# Patient Record
Sex: Female | Born: 1945 | Race: Black or African American | Hispanic: No | Marital: Married | Smoking: Never smoker
Health system: Southern US, Community
[De-identification: ages and names within clinical notes are randomized; demographics above are authoritative.]

## PROBLEM LIST (undated history)

## (undated) DIAGNOSIS — F0390 Unspecified dementia without behavioral disturbance: Secondary | ICD-10-CM

## (undated) DIAGNOSIS — E785 Hyperlipidemia, unspecified: Secondary | ICD-10-CM

## (undated) HISTORY — DX: Unspecified dementia, unspecified severity, without behavioral disturbance, psychotic disturbance, mood disturbance, and anxiety: F03.90

## (undated) HISTORY — DX: Hyperlipidemia, unspecified: E78.5

---

## 2004-08-28 ENCOUNTER — Emergency Department (HOSPITAL_COMMUNITY): Admission: EM | Admit: 2004-08-28 | Discharge: 2004-08-28 | Payer: Self-pay | Admitting: Family Medicine

## 2006-12-20 ENCOUNTER — Ambulatory Visit: Payer: Self-pay | Admitting: Family Medicine

## 2007-09-20 ENCOUNTER — Ambulatory Visit: Payer: Self-pay | Admitting: Family Medicine

## 2007-10-25 ENCOUNTER — Other Ambulatory Visit: Admission: RE | Admit: 2007-10-25 | Discharge: 2007-10-25 | Payer: Self-pay | Admitting: Gynecology

## 2008-03-22 ENCOUNTER — Emergency Department (HOSPITAL_COMMUNITY): Admission: EM | Admit: 2008-03-22 | Discharge: 2008-03-22 | Payer: Self-pay | Admitting: Emergency Medicine

## 2008-05-01 ENCOUNTER — Ambulatory Visit: Payer: Self-pay | Admitting: Family Medicine

## 2008-05-01 ENCOUNTER — Encounter: Admission: RE | Admit: 2008-05-01 | Discharge: 2008-05-01 | Payer: Self-pay | Admitting: Family Medicine

## 2008-05-07 ENCOUNTER — Encounter: Admission: RE | Admit: 2008-05-07 | Discharge: 2008-06-21 | Payer: Self-pay | Admitting: Family Medicine

## 2008-06-12 ENCOUNTER — Ambulatory Visit: Payer: Self-pay | Admitting: Family Medicine

## 2008-07-11 ENCOUNTER — Ambulatory Visit: Payer: Self-pay | Admitting: Family Medicine

## 2009-03-21 ENCOUNTER — Ambulatory Visit: Payer: Self-pay | Admitting: Family Medicine

## 2010-05-11 ENCOUNTER — Encounter: Payer: Self-pay | Admitting: Family Medicine

## 2010-05-13 ENCOUNTER — Ambulatory Visit: Admit: 2010-05-13 | Payer: Self-pay | Admitting: Family Medicine

## 2011-04-30 ENCOUNTER — Encounter: Payer: Self-pay | Admitting: Internal Medicine

## 2012-05-12 ENCOUNTER — Ambulatory Visit (INDEPENDENT_AMBULATORY_CARE_PROVIDER_SITE_OTHER): Payer: Medicare Other | Admitting: Family Medicine

## 2012-05-12 ENCOUNTER — Encounter: Payer: Self-pay | Admitting: Medical

## 2012-05-12 VITALS — BP 150/78 | HR 72 | Temp 97.8°F | Resp 16 | Wt 145.0 lb

## 2012-05-12 DIAGNOSIS — R030 Elevated blood-pressure reading, without diagnosis of hypertension: Secondary | ICD-10-CM

## 2012-05-12 NOTE — Progress Notes (Signed)
  Subjective:    Patient ID: Amanda [name], female    DOB: July 31, 1945, 67 y.o.   MRN: [id number]  HPI She is here for consult concerning elevated blood pressure. She was seen recently and did have elevated blood pressure. She is here for further evaluation. Review of her record indicates she has had several systolics in the 150 range. Today's reading is still in the range.  Review of Systems     Objective:   Physical Exam Alert and in no distress otherwise not examined       Assessment & Plan:   1. Elevated blood pressure    I discussed treatment of blood pressure with her. Discussed risk of untreated pressure in regard to stroke, heart failure and kidney failure. Discussed diet, exercise, smoking and drinking. She does not smoke or drink. She has started a recent exercise program. After discussion with her, we will wait one month and check her pressure again after she has been involved in a program for least one month. She is comfortable with that approach.

## 2012-06-12 ENCOUNTER — Encounter: Payer: Self-pay | Admitting: Family Medicine

## 2012-06-12 ENCOUNTER — Ambulatory Visit (INDEPENDENT_AMBULATORY_CARE_PROVIDER_SITE_OTHER): Payer: Medicare Other | Admitting: Family Medicine

## 2012-06-12 VITALS — BP 150/80 | HR 96 | Wt 145.0 lb

## 2012-06-12 DIAGNOSIS — I10 Essential (primary) hypertension: Secondary | ICD-10-CM | POA: Insufficient documentation

## 2012-06-12 MED ORDER — HYDROCHLOROTHIAZIDE 12.5 MG PO CAPS: 12.5000 mg | ORAL_CAPSULE | Freq: Every day | ORAL | Status: DC

## 2012-06-12 NOTE — Progress Notes (Signed)
  Subjective:    Patient ID: Amanda [name], female    DOB: December 10, 1945, 67 y.o.   MRN: [id number]  HPI She is here for recheck. She has started an exercise program walking fairly regularly with her husband.   Review of Systems     Objective:   Physical Exam Alert and in no distress. Blood pressure is recorded.       Assessment & Plan:  Hypertension - Plan: hydrochlorothiazide (MICROZIDE) 12.5 MG capsule since her systolic is still elevated, I decided to place her on HCTZ. Discussed that she will need be on this regularly. Recheck here one month.

## 2012-07-10 ENCOUNTER — Ambulatory Visit (INDEPENDENT_AMBULATORY_CARE_PROVIDER_SITE_OTHER): Payer: Medicare Other | Admitting: Family Medicine

## 2012-07-10 ENCOUNTER — Encounter: Payer: Self-pay | Admitting: Family Medicine

## 2012-07-10 VITALS — BP 126/80 | HR 78 | Wt 143.0 lb

## 2012-07-10 DIAGNOSIS — I10 Essential (primary) hypertension: Secondary | ICD-10-CM

## 2012-07-10 NOTE — Progress Notes (Signed)
  Subjective:    Patient ID: Amanda [name], female    DOB: 07-30-1945, 67 y.o.   MRN: [id number]  HPI She is here for recheck on her blood pressure. She and her husband continue to walk on a regular basis. She is having no difficulty with the HCTZ.   Review of Systems     Objective:   Physical Exam Alert and in no distress. Blood pressure is recorded       Assessment & Plan:  Hypertension encouraged her to continue taking the medication and with her active lifestyle.

## 2013-02-15 ENCOUNTER — Other Ambulatory Visit (INDEPENDENT_AMBULATORY_CARE_PROVIDER_SITE_OTHER): Payer: Medicare Other

## 2013-02-15 DIAGNOSIS — Z23 Encounter for immunization: Secondary | ICD-10-CM

## 2013-03-09 ENCOUNTER — Encounter: Payer: Self-pay | Admitting: *Deleted

## 2013-04-07 ENCOUNTER — Telehealth: Payer: Self-pay | Admitting: Family Medicine

## 2013-05-18 NOTE — Telephone Encounter (Signed)
Message left

## 2013-06-15 ENCOUNTER — Other Ambulatory Visit: Payer: Self-pay | Admitting: Family Medicine

## 2013-07-13 ENCOUNTER — Encounter: Payer: Self-pay | Admitting: Family Medicine

## 2013-07-13 ENCOUNTER — Ambulatory Visit (INDEPENDENT_AMBULATORY_CARE_PROVIDER_SITE_OTHER): Payer: Medicare Other | Admitting: Family Medicine

## 2013-07-13 VITALS — BP 110/72 | HR 72 | Wt 143.0 lb

## 2013-07-13 DIAGNOSIS — Z1211 Encounter for screening for malignant neoplasm of colon: Secondary | ICD-10-CM

## 2013-07-13 DIAGNOSIS — I10 Essential (primary) hypertension: Secondary | ICD-10-CM

## 2013-07-13 DIAGNOSIS — Z79899 Other long term (current) drug therapy: Secondary | ICD-10-CM

## 2013-07-13 DIAGNOSIS — E785 Hyperlipidemia, unspecified: Secondary | ICD-10-CM

## 2013-07-13 LAB — COMPREHENSIVE METABOLIC PANEL
ALK PHOS: 49 U/L (ref 39–117)
ALT: 14 U/L (ref 0–35)
AST: 18 U/L (ref 0–37)
Albumin: 4.3 g/dL (ref 3.5–5.2)
BILIRUBIN TOTAL: 0.3 mg/dL (ref 0.2–1.2)
BUN: 12 mg/dL (ref 6–23)
CO2: 26 meq/L (ref 19–32)
CREATININE: 0.78 mg/dL (ref 0.50–1.10)
Calcium: 9.5 mg/dL (ref 8.4–10.5)
Chloride: 106 mEq/L (ref 96–112)
GLUCOSE: 86 mg/dL (ref 70–99)
Potassium: 4.1 mEq/L (ref 3.5–5.3)
SODIUM: 143 meq/L (ref 135–145)
TOTAL PROTEIN: 7.1 g/dL (ref 6.0–8.3)

## 2013-07-13 LAB — CBC WITH DIFFERENTIAL/PLATELET
BASOS PCT: 1 % (ref 0–1)
Basophils Absolute: 0.1 10*3/uL (ref 0.0–0.1)
EOS ABS: 0.1 10*3/uL (ref 0.0–0.7)
EOS PCT: 1 % (ref 0–5)
HCT: 36.1 % (ref 36.0–46.0)
HEMOGLOBIN: 12.7 g/dL (ref 12.0–15.0)
LYMPHS ABS: 2.4 10*3/uL (ref 0.7–4.0)
Lymphocytes Relative: 37 % (ref 12–46)
MCH: 28.3 pg (ref 26.0–34.0)
MCHC: 35.2 g/dL (ref 30.0–36.0)
MCV: 80.6 fL (ref 78.0–100.0)
MONOS PCT: 7 % (ref 3–12)
Monocytes Absolute: 0.5 10*3/uL (ref 0.1–1.0)
NEUTROS PCT: 54 % (ref 43–77)
Neutro Abs: 3.5 10*3/uL (ref 1.7–7.7)
PLATELETS: 293 10*3/uL (ref 150–400)
RBC: 4.48 MIL/uL (ref 3.87–5.11)
RDW: 14 % (ref 11.5–15.5)
WBC: 6.5 10*3/uL (ref 4.0–10.5)

## 2013-07-13 LAB — LIPID PANEL
CHOL/HDL RATIO: 3.6 ratio
CHOLESTEROL: 197 mg/dL (ref 0–200)
HDL: 55 mg/dL (ref >39)
LDL CALC: 128 mg/dL — AB (ref 0–99)
TRIGLYCERIDES: 71 mg/dL (ref <150)
VLDL: 14 mg/dL (ref 0–40)

## 2013-07-13 MED ORDER — HYDROCHLOROTHIAZIDE 12.5 MG PO CAPS: ORAL_CAPSULE | ORAL | Status: DC

## 2013-07-13 NOTE — Progress Notes (Signed)
   Subjective:    Patient ID: Amanda [name], female    DOB: [birth year], 68 y.o.   MRN: [id number]  HPI She is here for medication check. She does have hypertension and is on HCTZ. She has no concerns about this. There is a history of hyperlipidemia. She has not had blood work done in quite some time. Review his record indicates she does need a followup mammogram as well as colonoscopy. Her immunizations are up to date. Social and family history were reviewed. She has been married for over 50 years. She has no other concerns or complaints.   Review of Systems Negative Except as above    Objective:   Physical Exam alert and in no distress. Tympanic membranes and canals are normal. Throat is clear. Tonsils are normal. Neck is supple without adenopathy or thyromegaly. Cardiac exam shows a regular sinus rhythm without murmurs or gallops. Lungs are clear to auscultation. DTRs are normal.        Assessment & Plan:  Hypertension - Plan: CBC with Differential, Comprehensive metabolic panel, hydrochlorothiazide (MICROZIDE) 12.5 MG capsule  Hyperlipidemia LDL goal < 130 - Plan: Lipid panel  Special screening for malignant neoplasms, colon - Plan: Ambulatory referral to Gastroenterology  Encounter for long-term (current) use of other medications - Plan: MM DIGITAL SCREENING BILATERAL

## 2013-08-27 LAB — HM MAMMOGRAPHY

## 2013-09-18 ENCOUNTER — Encounter: Payer: Self-pay | Admitting: Family Medicine

## 2013-09-27 ENCOUNTER — Encounter: Payer: Self-pay | Admitting: Internal Medicine

## 2013-12-06 ENCOUNTER — Encounter: Payer: Self-pay | Admitting: Family Medicine

## 2014-05-23 ENCOUNTER — Telehealth: Payer: Self-pay | Admitting: Family Medicine

## 2014-05-23 NOTE — Telephone Encounter (Signed)
Pt's sister.Amanda HashimotoPatricia, requesting a referral to [facility] because pt is showing early signs of Alzheimers and their mother and sister passed away with this disease. Pt is denying this so sister and another family member coming in town this weekend and plan to take her to Neurology appt on 2/12 without telling pt first.

## 2014-05-23 NOTE — Telephone Encounter (Signed)
Dr Susann GivensLalonde would like to see the pt before he does a referral. Called the sister back to let her know and the sister did not think she could get the pt to agree to come in for an appt because they did not have a reason for her to come in since they were not telling her she was being evaluated for dementia. I offered to call the pt to ask if she would like to make an appt to see Dr Susann GivensLalonde next week. I did and the pt sd she would call back to schedule the appt since she has family coming in towm. The sister will take over from this point.

## 2014-05-30 ENCOUNTER — Encounter: Payer: Self-pay | Admitting: Family Medicine

## 2014-05-30 ENCOUNTER — Ambulatory Visit (INDEPENDENT_AMBULATORY_CARE_PROVIDER_SITE_OTHER): Payer: PPO | Admitting: Family Medicine

## 2014-05-30 VITALS — BP 126/80 | Wt 134.0 lb

## 2014-05-30 DIAGNOSIS — I10 Essential (primary) hypertension: Secondary | ICD-10-CM

## 2014-05-30 DIAGNOSIS — E785 Hyperlipidemia, unspecified: Secondary | ICD-10-CM

## 2014-05-30 DIAGNOSIS — Z13820 Encounter for screening for osteoporosis: Secondary | ICD-10-CM

## 2014-05-30 DIAGNOSIS — Z1211 Encounter for screening for malignant neoplasm of colon: Secondary | ICD-10-CM

## 2014-05-30 DIAGNOSIS — Z23 Encounter for immunization: Secondary | ICD-10-CM

## 2014-05-30 MED ORDER — HYDROCHLOROTHIAZIDE 12.5 MG PO CAPS: ORAL_CAPSULE | ORAL | Status: DC

## 2014-05-30 NOTE — Progress Notes (Signed)
   Subjective:    Patient ID: Amanda [name], female    DOB: 1946-02-06, 69 y.o.   MRN: [id number]  HPI She is here for an interval evaluation. She does have underlying hypertension as well as hyperlipidemia. She is not on any medications for her lipids. She is under stress mainly due to general daily living and now being retired. 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 She states that her husband does not provide much help around the house. Apparently information from family members concerns her repeating herself, staring off into space and not his vocal. She does not indicate any of these being a problem. I did not mention to her that they had concerns over this. She does not complain of any memory issues. She did not get a colonoscopy. She has not had a DEXA scan.   Review of Systems     Objective:   Physical Exam Alert and in no distress. Tympanic membranes and canals are normal. Pharyngeal area is normal. Neck is supple without adenopathy or thyromegaly. Cardiac exam shows a regular sinus rhythm without murmurs or gallops. Lungs are clear to auscultation. Abdominal exam shows no masses or tenderness. DTRs are normal. MMSE was 29.        Assessment & Plan:  Need for prophylactic vaccination against Streptococcus pneumoniae (pneumococcus) - Plan: Pneumococcal conjugate vaccine 13-valent  Need for prophylactic vaccination and inoculation against influenza - Plan: Flu Vaccine QUAD 36+ mos IM  Special screening for malignant neoplasms, colon - Plan: Ambulatory referral to Gastroenterology  Hyperlipidemia LDL goal <100 - Plan: Lipid panel  Essential hypertension - Plan: CBC with Differential/Platelet, Comprehensive metabolic panel, hydrochlorothiazide (MICROZIDE) 12.5 MG capsule  Screening for osteoporosis - Plan: DG Bone Density  at this point I see no evidence of decreasing mental function. I will do routine screening on her and renew her blood pressure medication.

## 2014-05-31 LAB — COMPREHENSIVE METABOLIC PANEL
ALBUMIN: 4.6 g/dL (ref 3.5–5.2)
ALK PHOS: 54 U/L (ref 39–117)
ALT: 16 U/L (ref 0–35)
AST: 20 U/L (ref 0–37)
BUN: 13 mg/dL (ref 6–23)
CALCIUM: 9.9 mg/dL (ref 8.4–10.5)
CO2: 24 mEq/L (ref 19–32)
CREATININE: 0.75 mg/dL (ref 0.50–1.10)
Chloride: 99 mEq/L (ref 96–112)
GLUCOSE: 100 mg/dL — AB (ref 70–99)
POTASSIUM: 3.7 meq/L (ref 3.5–5.3)
Sodium: 136 mEq/L (ref 135–145)
Total Bilirubin: 0.5 mg/dL (ref 0.2–1.2)
Total Protein: 7.7 g/dL (ref 6.0–8.3)

## 2014-05-31 LAB — LIPID PANEL
CHOLESTEROL: 242 mg/dL — AB (ref 0–200)
HDL: 74 mg/dL (ref >39)
LDL Cholesterol: 155 mg/dL — ABNORMAL HIGH (ref 0–99)
TRIGLYCERIDES: 65 mg/dL (ref <150)
Total CHOL/HDL Ratio: 3.3 Ratio
VLDL: 13 mg/dL (ref 0–40)

## 2014-05-31 LAB — CBC WITH DIFFERENTIAL/PLATELET
BASOS ABS: 0 10*3/uL (ref 0.0–0.1)
Basophils Relative: 0 % (ref 0–1)
EOS ABS: 0 10*3/uL (ref 0.0–0.7)
EOS PCT: 0 % (ref 0–5)
HEMATOCRIT: 41 % (ref 36.0–46.0)
HEMOGLOBIN: 13.7 g/dL (ref 12.0–15.0)
LYMPHS ABS: 1.5 10*3/uL (ref 0.7–4.0)
LYMPHS PCT: 20 % (ref 12–46)
MCH: 28 pg (ref 26.0–34.0)
MCHC: 33.4 g/dL (ref 30.0–36.0)
MCV: 83.8 fL (ref 78.0–100.0)
MONO ABS: 0.5 10*3/uL (ref 0.1–1.0)
MPV: 10.7 fL (ref 8.6–12.4)
Monocytes Relative: 6 % (ref 3–12)
NEUTROS ABS: 5.6 10*3/uL (ref 1.7–7.7)
NEUTROS PCT: 74 % (ref 43–77)
Platelets: 258 10*3/uL (ref 150–400)
RBC: 4.89 MIL/uL (ref 3.87–5.11)
RDW: 13.4 % (ref 11.5–15.5)
WBC: 7.5 10*3/uL (ref 4.0–10.5)

## 2014-06-07 ENCOUNTER — Telehealth: Payer: Self-pay | Admitting: Family Medicine

## 2014-06-07 NOTE — Telephone Encounter (Signed)
If Darryl on the hippa form you can explained to him that I did a mental status evaluation on her and found no evidence of Alzheimer's. If he wants to discuss this further, have him come in with his mother so there can be free and open discussion,

## 2014-06-07 NOTE — Telephone Encounter (Signed)
Sister, Elease Hashimotoatricia (not on hipaa), to inquire if Dr Susann GivensLalonde would refer pt to neurologist now due to memory issues and family history of alzheimer's since she had been seen now. Also wanted to know if labwork had been done at recent visit that would show if patient has dementia issues. Advised sister that she was not on hipaa and that info could not be given to her and she said that pt's son ,Rachael FeeDarryl, would be calling to find this info out as well. Darryl is on hipaa.

## 2014-06-10 NOTE — Telephone Encounter (Signed)
Pt's son, Rachael FeeDarryl, called left a message to speak to me. I called him back today and left message with Dr. Jola BabinskiLalonde's instructions.

## 2014-07-04 ENCOUNTER — Encounter: Payer: Self-pay | Admitting: Family Medicine

## 2014-07-04 ENCOUNTER — Telehealth: Payer: Self-pay | Admitting: Family Medicine

## 2014-07-04 ENCOUNTER — Ambulatory Visit: Payer: PPO | Admitting: Family Medicine

## 2014-07-04 NOTE — Telephone Encounter (Signed)
Son had made an appt for pt/mom.  Mom cancelled the appt.  Son(Amanda [name]) flew in for appt.  So he was not happy when he got here and appt was cancelled.  Son states Mom has early stages of demintia.  I discussed with Dr. Susann GivensLalonde and we will refer to neurologist.  Mom and son are agreeable.  Dr. Susann GivensLalonde will call son on Monday.  Amanda [place] [phone]

## 2014-07-25 ENCOUNTER — Telehealth: Payer: Self-pay | Admitting: Internal Medicine

## 2014-07-25 DIAGNOSIS — R413 Other amnesia: Secondary | ICD-10-CM

## 2014-07-25 NOTE — Telephone Encounter (Signed)
Please check on the status of this

## 2014-07-25 NOTE — Telephone Encounter (Signed)
Amanda CootsDarrell [name] called to find out what the status of the neurolgy referral is. Amanda would like to be informed on the appt as well as he is the caregiver for this pt and he is always traveling and may not be home when the patient gets the call.

## 2014-07-26 NOTE — Telephone Encounter (Signed)
Set this up with [facility] for evaluation of possible Alzheimer's

## 2014-07-26 NOTE — Telephone Encounter (Signed)
Called [facility]. I have to send all the notes and demographics for them to look over and then they will call and schedule appt. I have faxed everything over to [facility] @ [phone]

## 2014-07-26 NOTE — Telephone Encounter (Signed)
Pt's sister called for pt and pt's son, Amanda [name]. Pt's son is out of the country in [place] and can not call often. Amanda [name] would like to be called @ [phone] with the appointment date and related info or the phone number of the neurology office so that he can make the appointment. Do not call the patient because the patient will forget the appointment, cancel the appointment, or just choose to not go. Son will be coming home from [place] to go with pt to the appt

## 2014-07-26 NOTE — Telephone Encounter (Signed)
Amanda MossesDiana did you make this referral because this is the first I have heard anything about it

## 2014-07-26 NOTE — Telephone Encounter (Signed)
I have also put in referral with comments to call the Son for appt info

## 2014-07-26 NOTE — Telephone Encounter (Signed)
i am not sure that the referral has been done. What are the dx code for the reason pt needs to go to neurology

## 2014-09-02 ENCOUNTER — Other Ambulatory Visit: Payer: Self-pay

## 2014-09-02 ENCOUNTER — Telehealth: Payer: Self-pay

## 2014-09-02 NOTE — Telephone Encounter (Signed)
Amanda [name]'s son, Laban EmperorDarrell called in to ask if a MRI was ordered with the referral to [facility].  He states that there is a strong family history of dementia and was hoping for the MRI for his mother. Since he has to fly into the country for his mother's appointments,  he called [place] and they told him it was only a consultation visit.  Please advise.

## 2014-09-03 NOTE — Telephone Encounter (Signed)
Explained that I will like to wait and see what the neurologist says. An MRI is not diagnostic of Alzheimer's disease. It does show structural changes but not functional changes

## 2014-09-03 NOTE — Telephone Encounter (Signed)
Followed up with pt's son regarding MRI not necessary at this time.

## 2014-10-02 ENCOUNTER — Other Ambulatory Visit (INDEPENDENT_AMBULATORY_CARE_PROVIDER_SITE_OTHER): Payer: PPO

## 2014-10-02 ENCOUNTER — Telehealth: Payer: Self-pay | Admitting: Family Medicine

## 2014-10-02 ENCOUNTER — Encounter: Payer: Self-pay | Admitting: Neurology

## 2014-10-02 ENCOUNTER — Ambulatory Visit (INDEPENDENT_AMBULATORY_CARE_PROVIDER_SITE_OTHER): Payer: PPO | Admitting: Neurology

## 2014-10-02 VITALS — BP 132/80 | HR 93 | Resp 16 | Wt 134.0 lb

## 2014-10-02 DIAGNOSIS — R413 Other amnesia: Secondary | ICD-10-CM

## 2014-10-02 DIAGNOSIS — I10 Essential (primary) hypertension: Secondary | ICD-10-CM

## 2014-10-02 DIAGNOSIS — F03B0 Unspecified dementia, moderate, without behavioral disturbance, psychotic disturbance, mood disturbance, and anxiety: Secondary | ICD-10-CM | POA: Insufficient documentation

## 2014-10-02 LAB — TSH: TSH: 4.27 u[IU]/mL (ref 0.35–4.50)

## 2014-10-02 LAB — VITAMIN B12: Vitamin B-12: 905 pg/mL (ref 211–911)

## 2014-10-02 NOTE — Patient Instructions (Signed)
1. Schedule MRI brain without contrast 2. Bloodwork from your PCP will be requested for review, if not done, B12 and TSH will be ordered 3. Discuss anxiety and mood with your PCP 4. Physical exercise and brain stimulation exercises are important for brain health 5. Follow-up in 1 year

## 2014-10-02 NOTE — Progress Notes (Signed)
NEUROLOGY CONSULTATION NOTE  Amanda [name] MRN: [id number] DOB: 04-Jun-1945  Referring provider: Dr. Sharlot Gowda Primary care provider: Dr. Sharlot Gowda  Reason for consult:  Memory loss  Dear Dr Susann Givens:  Thank you for your kind referral of Amanda [name] for consultation of the above symptoms. Although her history is well known to you, please allow me to reiterate it for the purpose of our medical record. The patient was accompanied to the clinic by her son who also provides collateral information. Records and images were personally reviewed where available.  HISTORY OF PRESENT ILLNESS: This is a 69 year old right-handed woman with a history of hypertension presenting for evaluation of memory loss. She is upset with her son today because she states her memory is good and does not feel there is any issue. She lives with her husband and states that she does everything at home, "I am the head of the house." She takes care of the bills and denies any missed bill payments. She cooks and denies leaving the stove on. She denies missing medication. She denies any word-finding difficulties. She reports she does all the cleaning, cooking, bills, "while he watches football." She endorses stress at home. Her son reports memory changes that started around 2 years ago, more in the past year. Mostly he has noticed short-term memory changes where he has to tell her something more than once. She would say that it is because she is tired. She would misplace her pocketbook or coat a lot. He did notice that when there was stress with broken pipes in their house, that he saw more of it. He reports her husband and sister have expressed the same short-term memory concerns as well. She states she gets upset with her husband and that there is stress at home. Her son reports that she fusses with his father more than before, with a little paranoia, blaming him for things that are happening ("you did this") more  frequently than before.   She denies any headaches, dizziness, diplopia, dysarthria, dysphagia, neck/back pain, focal numbness/tingling/weakness, bowel/bladder dysfunction. No anosmia, tremors, no falls. He denies any significant head injuries or alcohol use. Her mother had dementia in her 11s. 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   Laboratory Data: Lab Results  Component Value Date   WBC 7.5 05/30/2014   HGB 13.7 05/30/2014   HCT 41.0 05/30/2014   MCV 83.8 05/30/2014   PLT 258 05/30/2014     Chemistry      Component Value Date/Time   NA 136 05/30/2014 1602   K 3.7 05/30/2014 1602   CL 99 05/30/2014 1602   CO2 24 05/30/2014 1602   BUN 13 05/30/2014 1602   CREATININE 0.75 05/30/2014 1602      Component Value Date/Time   CALCIUM 9.9 05/30/2014 1602   ALKPHOS 54 05/30/2014 1602   AST 20 05/30/2014 1602   ALT 16 05/30/2014 1602   BILITOT 0.5 05/30/2014 1602     Lab Results  Component Value Date   CHOL 242* 05/30/2014   HDL 74 05/30/2014   LDLCALC 155* 05/30/2014   TRIG 65 05/30/2014   CHOLHDL 3.3 05/30/2014    PAST MEDICAL HISTORY: Past Medical History  Diagnosis Date  . Dyslipidemia     PAST SURGICAL HISTORY: No past surgical history on file.  MEDICATIONS: Current Outpatient Prescriptions on File Prior to Visit  Medication Sig Dispense Refill  . hydrochlorothiazide (MICROZIDE) 12.5 MG capsule TAKE ONE CAPSULE BY  MOUTH EVERY DAY 90 capsule 3   No current facility-administered medications on file prior to visit.    ALLERGIES: Allergies  Allergen Reactions  . Codeine     FAMILY HISTORY: Family History  Problem Relation Age of Onset  . Dementia Mother   . Heart disease Maternal Grandmother     SOCIAL HISTORY: History   Social History  . Marital Status: Married    Spouse Name: N/A  . Number of Children: 2  . Years of Education: N/A   Occupational History  . Retired    Social History Main Topics  . Smoking status: Never  Smoker   . Smokeless tobacco: Never Used  . Alcohol Use: No  . Drug Use: No  . Sexual Activity: Yes   Other Topics Concern  . Not on file   Social History Narrative    REVIEW OF SYSTEMS: Constitutional: No fevers, chills, or sweats, no generalized fatigue, change in appetite Eyes: No visual changes, double vision, eye pain Ear, nose and throat: No hearing loss, ear pain, nasal congestion, sore throat Cardiovascular: No chest pain, palpitations Respiratory:  No shortness of breath at rest or with exertion, wheezes GastrointestinaI: No nausea, vomiting, diarrhea, abdominal pain, fecal incontinence Genitourinary:  No dysuria, urinary retention or frequency Musculoskeletal:  No neck pain, back pain Integumentary: No rash, pruritus, skin lesions Neurological: as above Psychiatric: No depression, insomnia, anxiety Endocrine: No palpitations, fatigue, diaphoresis, mood swings, change in appetite, change in weight, increased thirst Hematologic/Lymphatic:  No anemia, purpura, petechiae. Allergic/Immunologic: no itchy/runny eyes, nasal congestion, recent allergic reactions, rashes  PHYSICAL EXAM: Filed Vitals:   10/02/14 0919  BP: 132/80  Pulse: 93  Resp: 16   General: No acute distress Head:  Normocephalic/atraumatic Eyes: Fundoscopic exam shows bilateral sharp discs, no vessel changes, exudates, or hemorrhages Neck: supple, no paraspinal tenderness, full range of motion Back: No paraspinal tenderness Heart: regular rate and rhythm Lungs: Clear to auscultation bilaterally. Vascular: No carotid bruits. Skin/Extremities: No rash, no edema Neurological Exam: Mental status: alert and oriented to person, place, month and year. She did not know date and day of the week, and reports that she is very nervous. No dysarthria or aphasia, Fund of knowledge is appropriate.  Remote memory intact.  Attention and concentration are normal.    Able to name objects and repeat phrases. Clock drawing  1/5 (see attached sheet) MMSE - Mini Mental State Exam 10/02/2014  Orientation to time 2  Orientation to Place 5  Registration 3  Attention/ Calculation 4  Recall 0  Language- name 2 objects 2  Language- repeat 1  Language- follow 3 step command 3  Language- read & follow direction 1  Write a sentence 1  Copy design 1  Total score 23   Cranial nerves: CN I: not tested CN II: pupils equal, round and reactive to light, visual fields intact, fundi unremarkable. CN III, IV, VI:  full range of motion, no nystagmus, no ptosis CN V: facial sensation intact CN VII: upper and lower face symmetric CN VIII: hearing intact to finger rub CN IX, X: gag intact, uvula midline CN XI: sternocleidomastoid and trapezius muscles intact CN XII: tongue midline Bulk & Tone: normal, no fasciculations. Motor: 5/5 throughout with no pronator drift. Sensation: intact to light touch, cold, pin, vibration and joint position sense.  No extinction to double simultaneous stimulation.  Romberg test negative Deep Tendon Reflexes: +2 throughout except for absent ankle jerks bilaterally, no ankle clonus Plantar responses: downgoing bilaterally Cerebellar: no  incoordination on finger to nose, heel to shin. No dysdiadochokinesia Gait: narrow-based and steady, able to tandem walk adequately. Tremor: none  IMPRESSION: This is a 69 year old right-handed woman with a history of hypertension presenting for evaluation of short-term memory loss. Her MMSE today is 23/30, note of poor clock drawing. She however states she is very nervous and is upset with her son because she does not believe she has any problems. She continues to function independently, taking care of her household, including bill payments. By history, symptoms suggestive of age-related memory changes, possible mild cognitive impairment, worsened by anxiety/stress. We discussed different causes of memory loss. Check TSH and B12. MRI brain without contrast will be  ordered to assess for underlying structural abnormality and assess vascular load. We also discussed effects of mood on memory, they will discuss anxiety with her PCP. There is no clear indication to start cholinesterase inhibitors at this time. We discussed the importance of control of vascular risk factors, physical exercise, and brain stimulation exercises for brain health. She will follow-up in 1 year.   Thank you for allowing me to participate in the care of this patient. Please do not hesitate to call for any questions or concerns.   Patrcia Dolly, M.D.  CC: Dr. Susann Givens

## 2014-10-02 NOTE — Telephone Encounter (Signed)
Patient notified of results.

## 2014-10-02 NOTE — Telephone Encounter (Signed)
-----   Message from Van Clines, MD sent at 10/02/2014 12:54 PM EDT ----- Pls let son know bloodwork is normal, thanks

## 2014-10-07 ENCOUNTER — Telehealth: Payer: Self-pay | Admitting: Family Medicine

## 2014-10-07 NOTE — Telephone Encounter (Signed)
Called patient to let her know that her MRI has been cancelled due to insurance approval. We are still waiting to hear back from [employer] on approval. Told patient once we get approval I will reschedule her scan and call her with the appt information.

## 2014-10-08 ENCOUNTER — Telehealth: Payer: Self-pay | Admitting: Family Medicine

## 2014-10-08 ENCOUNTER — Ambulatory Visit (HOSPITAL_COMMUNITY): Admission: RE | Admit: 2014-10-08 | Payer: PPO | Source: Ambulatory Visit

## 2014-10-08 NOTE — Telephone Encounter (Signed)
Called patient to give her new appt for MRI Brain. Patient scheduled at [facility] 10/16/14 @ 5:00 pm with a 4:45 pm arrival.

## 2014-10-16 ENCOUNTER — Ambulatory Visit (HOSPITAL_COMMUNITY)
Admission: RE | Admit: 2014-10-16 | Discharge: 2014-10-16 | Disposition: A | Discharge status: Home / Self Care | Payer: PPO | Source: Ambulatory Visit | Attending: Neurology | Admitting: Neurology

## 2014-10-16 DIAGNOSIS — R413 Other amnesia: Secondary | ICD-10-CM | POA: Insufficient documentation

## 2014-10-17 ENCOUNTER — Telehealth: Payer: Self-pay | Admitting: Family Medicine

## 2014-10-17 NOTE — Telephone Encounter (Signed)
-----   Message from Van ClinesKaren M Aquino, MD sent at 10/17/2014  8:40 AM EDT ----- Pls let patient know I reviewed MRI brain, no evidence of tumor, stroke, or bleed. It shows age-related changes. Thanks

## 2014-10-17 NOTE — Telephone Encounter (Signed)
Patient & patient's son/Darrell were notified of results.

## 2014-12-03 ENCOUNTER — Encounter: Payer: Self-pay | Admitting: Family Medicine

## 2014-12-03 ENCOUNTER — Ambulatory Visit (INDEPENDENT_AMBULATORY_CARE_PROVIDER_SITE_OTHER): Payer: PPO | Admitting: Family Medicine

## 2014-12-03 VITALS — BP 120/60 | HR 90 | Wt 133.4 lb

## 2014-12-03 DIAGNOSIS — R202 Paresthesia of skin: Secondary | ICD-10-CM | POA: Diagnosis not present

## 2014-12-03 DIAGNOSIS — R200 Anesthesia of skin: Secondary | ICD-10-CM

## 2014-12-03 NOTE — Progress Notes (Signed)
   Subjective:    Patient ID: Amanda [name], female    DOB: August 12, 1945, 69 y.o.   MRN: [id number]  HPI She is here for evaluation of a three-day history of tingling and sedation and her hands and feet. There is been no true numbness, weakness, fine motor problems, falling or weakness. She states that the symptoms are now gone.   Review of Systems     Objective:   Physical Exam Alert and in no distress. Exam of her hands shows normal strength, pulses and sensation. Feet were not examined.       Assessment & Plan:  Numbness and tingling I reassured her that I did not find anything of concern but if this occurs again, to return here. She was comfortable with this.

## 2015-05-07 ENCOUNTER — Other Ambulatory Visit (INDEPENDENT_AMBULATORY_CARE_PROVIDER_SITE_OTHER): Payer: PPO

## 2015-05-07 DIAGNOSIS — Z23 Encounter for immunization: Secondary | ICD-10-CM | POA: Diagnosis not present

## 2015-06-06 ENCOUNTER — Encounter: Payer: Self-pay | Admitting: Neurology

## 2015-06-27 ENCOUNTER — Other Ambulatory Visit: Payer: Self-pay | Admitting: Family Medicine

## 2015-10-01 ENCOUNTER — Ambulatory Visit: Payer: Self-pay | Admitting: Neurology

## 2015-11-08 ENCOUNTER — Other Ambulatory Visit: Payer: Self-pay | Admitting: Family Medicine

## 2015-12-19 ENCOUNTER — Ambulatory Visit: Payer: Self-pay | Admitting: Neurology

## 2016-03-05 ENCOUNTER — Telehealth: Payer: Self-pay | Admitting: Family Medicine

## 2016-03-05 NOTE — Telephone Encounter (Signed)
Left message for pt to call. Received form from ins company needs an appt before end of 2017.

## 2016-03-15 ENCOUNTER — Encounter: Payer: Self-pay | Admitting: Family Medicine

## 2016-03-15 ENCOUNTER — Ambulatory Visit (INDEPENDENT_AMBULATORY_CARE_PROVIDER_SITE_OTHER): Payer: PPO | Admitting: Family Medicine

## 2016-03-15 VITALS — BP 130/70 | HR 86 | Wt 140.0 lb

## 2016-03-15 DIAGNOSIS — I10 Essential (primary) hypertension: Secondary | ICD-10-CM

## 2016-03-15 DIAGNOSIS — Z1211 Encounter for screening for malignant neoplasm of colon: Secondary | ICD-10-CM | POA: Diagnosis not present

## 2016-03-15 DIAGNOSIS — Z1159 Encounter for screening for other viral diseases: Secondary | ICD-10-CM | POA: Diagnosis not present

## 2016-03-15 DIAGNOSIS — Z1231 Encounter for screening mammogram for malignant neoplasm of breast: Secondary | ICD-10-CM

## 2016-03-15 DIAGNOSIS — Z1239 Encounter for other screening for malignant neoplasm of breast: Secondary | ICD-10-CM

## 2016-03-15 DIAGNOSIS — E785 Hyperlipidemia, unspecified: Secondary | ICD-10-CM | POA: Diagnosis not present

## 2016-03-15 DIAGNOSIS — Z13820 Encounter for screening for osteoporosis: Secondary | ICD-10-CM

## 2016-03-15 DIAGNOSIS — Z23 Encounter for immunization: Secondary | ICD-10-CM

## 2016-03-15 LAB — COMPREHENSIVE METABOLIC PANEL
ALBUMIN: 4.7 g/dL (ref 3.6–5.1)
ALK PHOS: 60 U/L (ref 33–130)
ALT: 15 U/L (ref 6–29)
AST: 23 U/L (ref 10–35)
BUN: 10 mg/dL (ref 7–25)
CALCIUM: 9.6 mg/dL (ref 8.6–10.4)
CO2: 28 mmol/L (ref 20–31)
Chloride: 102 mmol/L (ref 98–110)
Creat: 0.83 mg/dL (ref 0.60–0.93)
Glucose, Bld: 95 mg/dL (ref 65–99)
POTASSIUM: 3.9 mmol/L (ref 3.5–5.3)
Sodium: 140 mmol/L (ref 135–146)
TOTAL PROTEIN: 7.7 g/dL (ref 6.1–8.1)
Total Bilirubin: 0.5 mg/dL (ref 0.2–1.2)

## 2016-03-15 LAB — CBC WITH DIFFERENTIAL/PLATELET
BASOS ABS: 0 {cells}/uL (ref 0–200)
BASOS PCT: 0 %
EOS ABS: 0 {cells}/uL — AB (ref 15–500)
Eosinophils Relative: 0 %
HEMATOCRIT: 40 % (ref 35.0–45.0)
Hemoglobin: 13.3 g/dL (ref 11.7–15.5)
Lymphocytes Relative: 21 %
Lymphs Abs: 1491 cells/uL (ref 850–3900)
MCH: 28.4 pg (ref 27.0–33.0)
MCHC: 33.3 g/dL (ref 32.0–36.0)
MCV: 85.3 fL (ref 80.0–100.0)
MONO ABS: 426 {cells}/uL (ref 200–950)
MPV: 10.4 fL (ref 7.5–12.5)
Monocytes Relative: 6 %
NEUTROS ABS: 5183 {cells}/uL (ref 1500–7800)
Neutrophils Relative %: 73 %
Platelets: 351 10*3/uL (ref 140–400)
RBC: 4.69 MIL/uL (ref 3.80–5.10)
RDW: 13.8 % (ref 11.0–15.0)
WBC: 7.1 10*3/uL (ref 4.0–10.5)

## 2016-03-15 LAB — LIPID PANEL
CHOL/HDL RATIO: 3.2 ratio (ref <5.0)
CHOLESTEROL: 229 mg/dL — AB (ref <200)
HDL: 72 mg/dL (ref >50)
LDL Cholesterol: 141 mg/dL — ABNORMAL HIGH (ref <100)
TRIGLYCERIDES: 80 mg/dL (ref <150)
VLDL: 16 mg/dL (ref <30)

## 2016-03-15 MED ORDER — HYDROCHLOROTHIAZIDE 12.5 MG PO CAPS: 12.5000 mg | ORAL_CAPSULE | Freq: Every day | ORAL | 3 refills | Status: DC

## 2016-03-15 NOTE — Progress Notes (Signed)
   Subjective:    Patient ID: Amanda [name], female    DOB: Oct 11, 1945, 70 y.o.   MRN: [id number]  HPI She is here for an interval evaluation. She does have underlying hypertension and continues on HCTZ. She also has a history of hyperlipidemia but presently is on no medications. She exercises regularly. She has no other concerns or complaints. Family and social history as well as health maintenance was reviewed.   Review of Systems     Objective:   Physical Exam Alert and in no distress. Tympanic membranes and canals are normal. Pharyngeal area is normal. Neck is supple without adenopathy or thyromegaly. Cardiac exam shows a regular sinus rhythm without murmurs or gallops. Lungs are clear to auscultation.        Assessment & Plan:  Essential hypertension - Plan: CBC with Differential/Platelet, Comprehensive metabolic panel, hydrochlorothiazide (MICROZIDE) 12.5 MG capsule  Need for prophylactic vaccination against Streptococcus pneumoniae (pneumococcus) - Plan: Pneumococcal polysaccharide vaccine 23-valent greater than or equal to 2yo subcutaneous/IM  Need for prophylactic vaccination and inoculation against influenza - Plan: Flu vaccine HIGH DOSE PF (Fluzone High dose)  Screening for breast cancer - Plan: MM DIGITAL SCREENING BILATERAL  Screening for colon cancer - Plan: Cologuard  Need for hepatitis C screening test - Plan: Hepatitis C antibody  Screening for osteoporosis - Plan: DG Bone Density  Hyperlipidemia LDL goal <100 - Plan: Lipid panel Also wrote her a prescription to get a tetanus as well as shingles vaccine at the drugstore. Encouraged her to get the 3-D mammography.

## 2016-03-16 LAB — HEPATITIS C ANTIBODY: HCV Ab: NEGATIVE

## 2016-04-27 ENCOUNTER — Telehealth: Payer: Self-pay | Admitting: Family Medicine

## 2016-04-27 NOTE — Telephone Encounter (Signed)
Pt left message that she has been out of country and has a box on her porch.  Reviewing the notes.  I would assume it is Cologuard.  Please call her with the process.

## 2016-04-27 NOTE — Telephone Encounter (Signed)
Pt states she was calling about a bill she recvd for $98.00. [facility]rixie Rude/[facility]

## 2016-05-20 IMAGING — MR MR HEAD W/O CM
8 of 10 series · 39 of 48 positions shown · non-contrast
Comparison: None.

CLINICAL DATA: Cognitive decline.  Short-term memory loss.

EXAM:
MRI HEAD WITHOUT CONTRAST
TECHNIQUE: Multiplanar, multiecho pulse sequences of the brain and surrounding
structures were obtained without intravenous contrast.

[Series 3: T1 · sagittal · 5.0mm · 0.47mm/px · 2 of 24 slices shown]
[im 1/24]
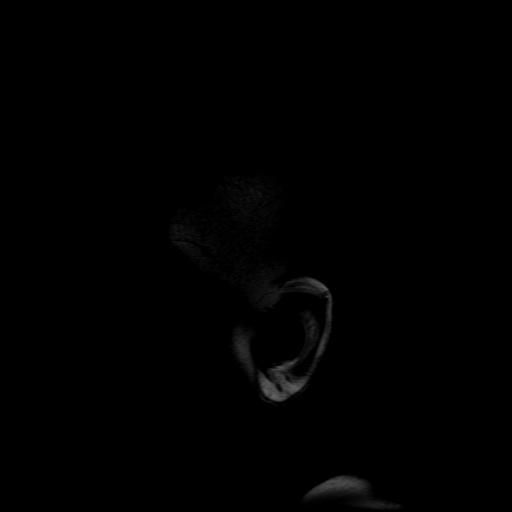
[im 24/24]
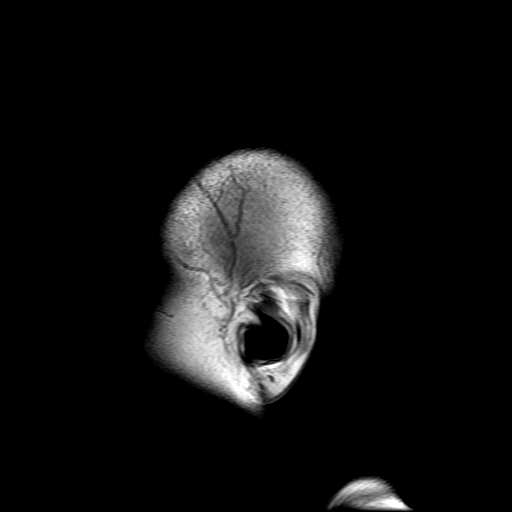

[Series 4: DWI · axial · 3.0mm · 1.09mm/px · z∈[-42,+99]mm · 11 of 98 slices shown (1 of 4)]
[im 1/98]
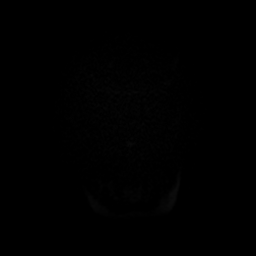
[im 10/98]
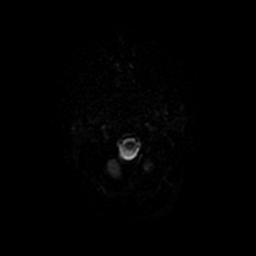
[im 20/98]
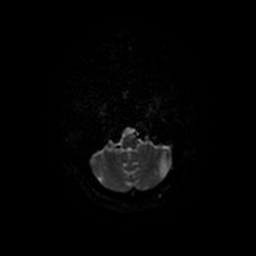
[im 30/98]
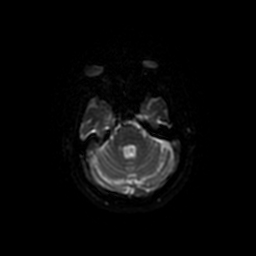
[im 39/98]
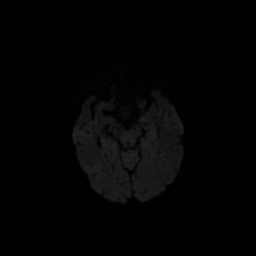
[im 49/98]
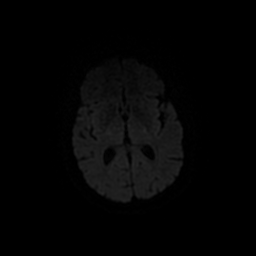
[im 59/98]
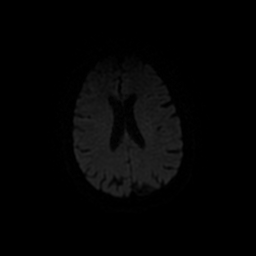
[im 68/98]
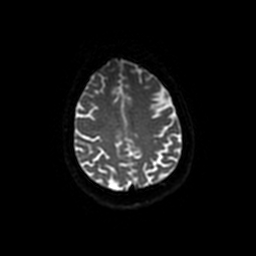
[im 78/98]
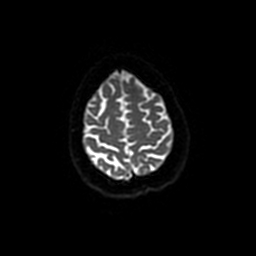
[im 88/98]
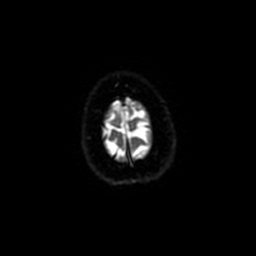
[im 98/98]
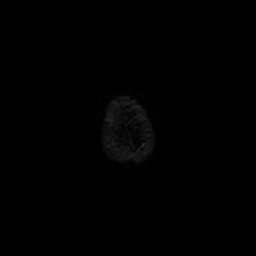

[Series 5: DWI · coronal · 5.0mm · 1.09mm/px · 8 of 74 slices shown (2 of 4)]
[im 1/74]
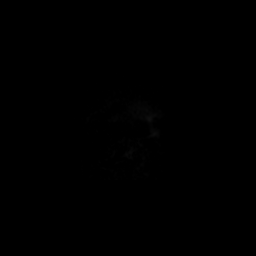
[im 11/74]
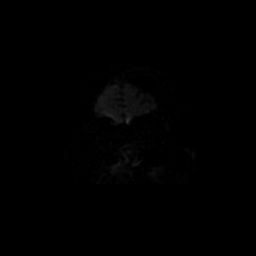
[im 21/74]
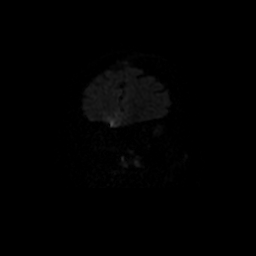
[im 32/74]
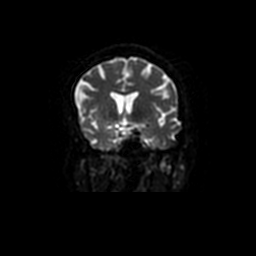
[im 42/74]
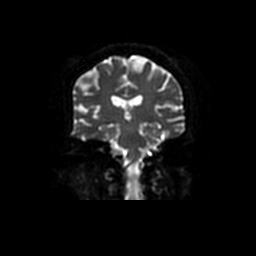
[im 53/74]
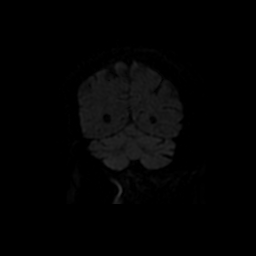
[im 63/74]
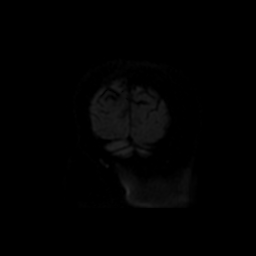
[im 74/74]
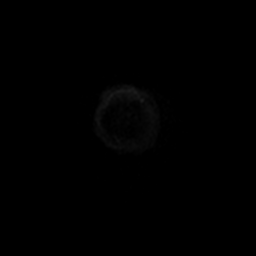

[Series 6: T2 · axial · 5.0mm · 0.43mm/px · z∈[-48,+95]mm · 3 of 24 slices shown (1 of 2)]
[im 1/24]
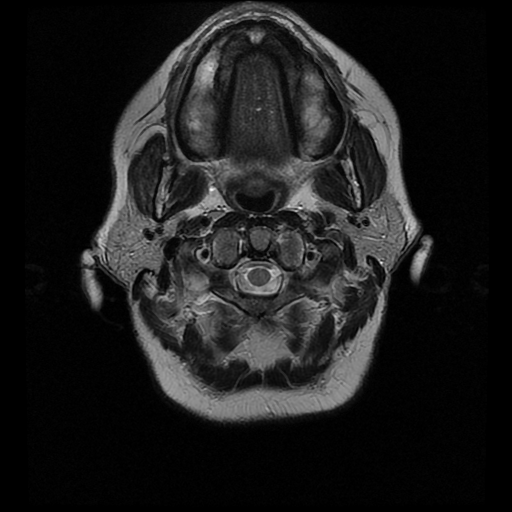
[im 12/24]
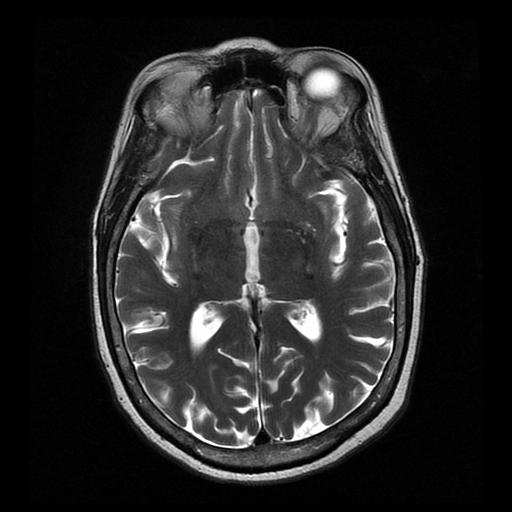
[im 24/24]
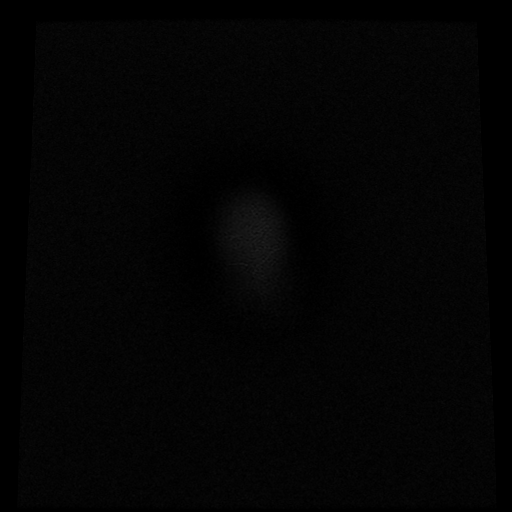

[Series 7: FLAIR · axial · 5.0mm · 0.43mm/px · z∈[-54,+101]mm · 3 of 24 slices shown]
[im 1/24]
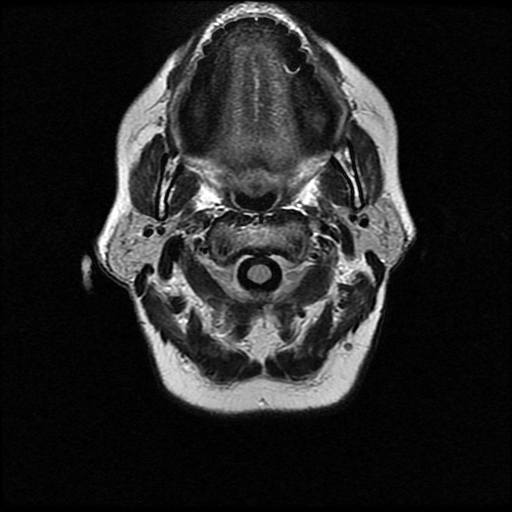
[im 12/24]
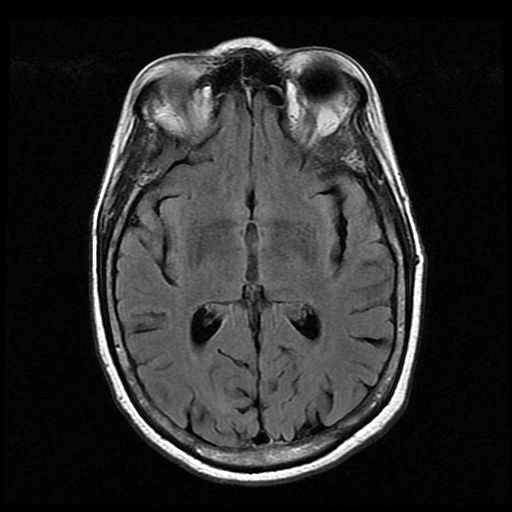
[im 24/24]
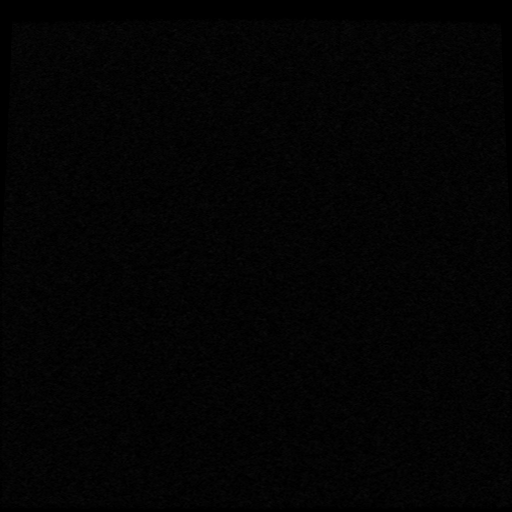

[Series 10: T2 · coronal · 5.0mm · 0.45mm/px · 3 of 28 slices shown (2 of 2)]
[im 1/28]
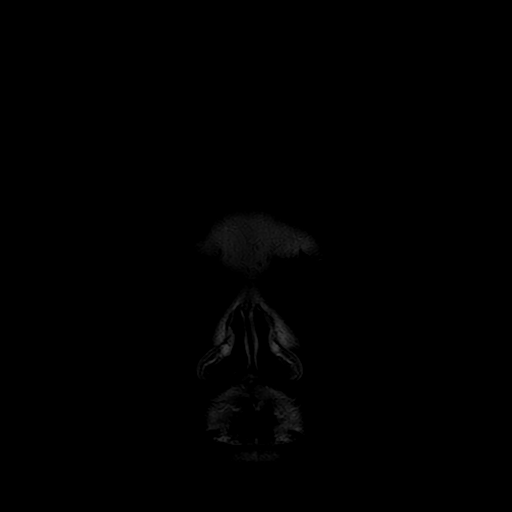
[im 14/28]
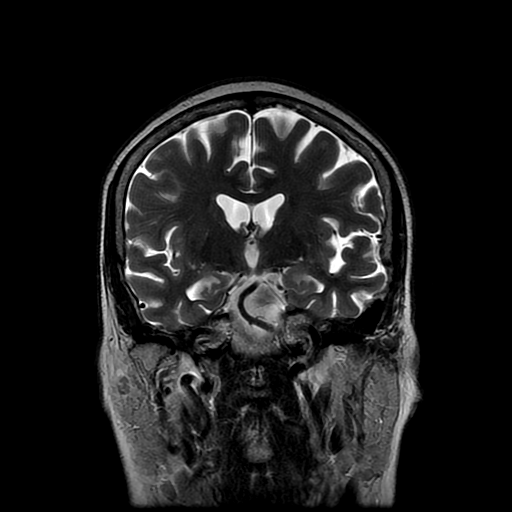
[im 28/28]
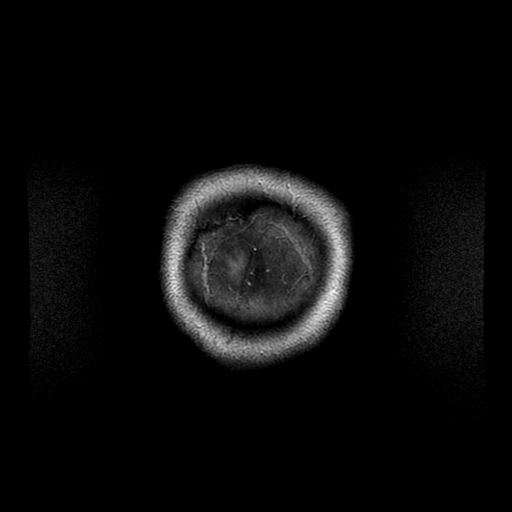

[Series 400: DWI · axial · 3.0mm · 1.09mm/px · z∈[-42,+99]mm · 5 of 49 slices shown (3 of 4)]
[im 1/49]
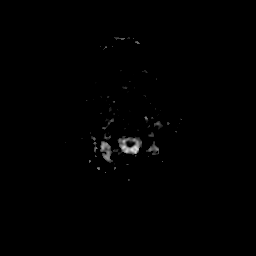
[im 13/49]
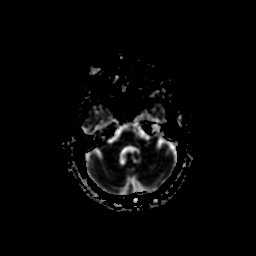
[im 25/49]
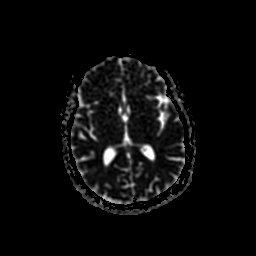
[im 37/49]
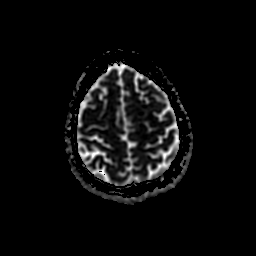
[im 49/49]
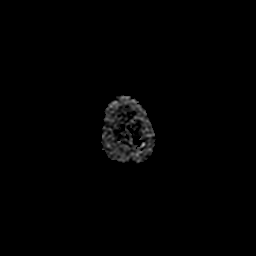

[Series 500: DWI · coronal · 5.0mm · 1.09mm/px · 4 of 37 slices shown (4 of 4)]
[im 1/37]
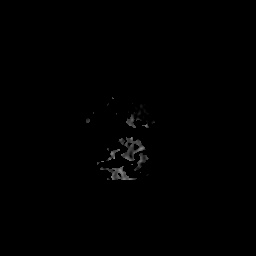
[im 13/37]
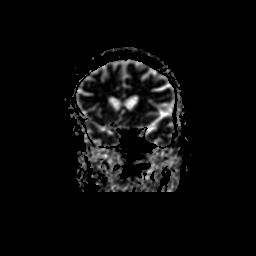
[im 25/37]
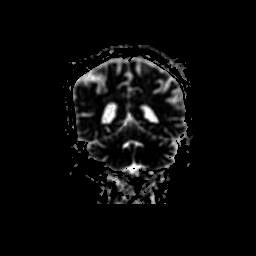
[im 37/37]
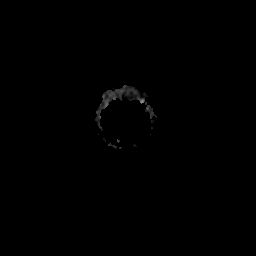

[39 of 48 positions shown; findings below may reference images not displayed]

FINDINGS: No evidence for acute infarction, hemorrhage, mass lesion,
hydrocephalus, or extra-axial fluid. Mild atrophy. Mild subcortical
and periventricular T2 and FLAIR hyperintensities, likely chronic
microvascular ischemic change. Flow voids are maintained throughout
the carotid, basilar, and vertebral arteries. There are no areas of
chronic hemorrhage. No midline abnormalities. Extracranial soft
tissues grossly unremarkable.
IMPRESSION: Mild atrophy. Mild white matter changes, likely small vessel
disease. No acute intracranial findings.

## 2017-03-03 ENCOUNTER — Telehealth: Payer: Self-pay | Admitting: Family Medicine
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
## 2017-03-23 ENCOUNTER — Other Ambulatory Visit: Payer: Self-pay | Admitting: Family Medicine

## 2017-03-23 DIAGNOSIS — I10 Essential (primary) hypertension: Secondary | ICD-10-CM

## 2017-03-24 ENCOUNTER — Telehealth: Payer: Self-pay | Admitting: Family Medicine

## 2017-03-24 ENCOUNTER — Other Ambulatory Visit (INDEPENDENT_AMBULATORY_CARE_PROVIDER_SITE_OTHER): Payer: PPO

## 2017-03-24 DIAGNOSIS — Z23 Encounter for immunization: Secondary | ICD-10-CM

## 2017-03-24 DIAGNOSIS — I10 Essential (primary) hypertension: Secondary | ICD-10-CM

## 2017-03-24 MED ORDER — HYDROCHLOROTHIAZIDE 12.5 MG PO CAPS: 12.5000 mg | ORAL_CAPSULE | Freq: Every day | ORAL | 0 refills | Status: DC

## 2017-03-24 NOTE — Telephone Encounter (Signed)
rx called in. /RLB  

## 2017-03-24 NOTE — Telephone Encounter (Signed)
LM for pt to CB.  She needs appt. rx denied.

## 2017-03-24 NOTE — Telephone Encounter (Signed)
Pt came in and made an appt for a med check. Needs refills on bp meds until then. [facility] [address].

## 2017-03-26 ENCOUNTER — Emergency Department (HOSPITAL_COMMUNITY): Payer: PPO

## 2017-03-26 ENCOUNTER — Encounter (HOSPITAL_COMMUNITY): Payer: Self-pay

## 2017-03-26 ENCOUNTER — Emergency Department (HOSPITAL_COMMUNITY)
Admission: EM | Admit: 2017-03-26 | Discharge: 2017-03-26 | Disposition: A | Discharge status: Home / Self Care | Payer: PPO | Attending: Emergency Medicine | Admitting: Emergency Medicine

## 2017-03-26 ENCOUNTER — Other Ambulatory Visit: Payer: Self-pay

## 2017-03-26 DIAGNOSIS — R079 Chest pain, unspecified: Secondary | ICD-10-CM | POA: Insufficient documentation

## 2017-03-26 DIAGNOSIS — M542 Cervicalgia: Secondary | ICD-10-CM | POA: Insufficient documentation

## 2017-03-26 DIAGNOSIS — F0390 Unspecified dementia without behavioral disturbance: Secondary | ICD-10-CM | POA: Diagnosis not present

## 2017-03-26 DIAGNOSIS — R42 Dizziness and giddiness: Secondary | ICD-10-CM | POA: Diagnosis not present

## 2017-03-26 DIAGNOSIS — S299XXA Unspecified injury of thorax, initial encounter: Secondary | ICD-10-CM | POA: Diagnosis not present

## 2017-03-26 DIAGNOSIS — I10 Essential (primary) hypertension: Secondary | ICD-10-CM | POA: Diagnosis not present

## 2017-03-26 DIAGNOSIS — G4489 Other headache syndrome: Secondary | ICD-10-CM | POA: Diagnosis not present

## 2017-03-26 DIAGNOSIS — S0990XA Unspecified injury of head, initial encounter: Secondary | ICD-10-CM | POA: Diagnosis not present

## 2017-03-26 DIAGNOSIS — R51 Headache: Secondary | ICD-10-CM | POA: Diagnosis not present

## 2017-03-26 DIAGNOSIS — S199XXA Unspecified injury of neck, initial encounter: Secondary | ICD-10-CM | POA: Diagnosis not present

## 2017-03-26 DIAGNOSIS — S098XXA Other specified injuries of head, initial encounter: Secondary | ICD-10-CM | POA: Diagnosis not present

## 2017-03-26 LAB — CBC WITH DIFFERENTIAL/PLATELET
BASOS PCT: 0 %
Basophils Absolute: 0 10*3/uL (ref 0.0–0.1)
EOS ABS: 0.1 10*3/uL (ref 0.0–0.7)
EOS PCT: 1 %
HCT: 40.6 % (ref 36.0–46.0)
HEMOGLOBIN: 13.3 g/dL (ref 12.0–15.0)
Lymphocytes Relative: 32 %
Lymphs Abs: 2 10*3/uL (ref 0.7–4.0)
MCH: 28.6 pg (ref 26.0–34.0)
MCHC: 32.8 g/dL (ref 30.0–36.0)
MCV: 87.3 fL (ref 78.0–100.0)
MONOS PCT: 8 %
Monocytes Absolute: 0.5 10*3/uL (ref 0.1–1.0)
NEUTROS PCT: 59 %
Neutro Abs: 3.7 10*3/uL (ref 1.7–7.7)
PLATELETS: 305 10*3/uL (ref 150–400)
RBC: 4.65 MIL/uL (ref 3.87–5.11)
RDW: 13.5 % (ref 11.5–15.5)
WBC: 6.3 10*3/uL (ref 4.0–10.5)

## 2017-03-26 LAB — COMPREHENSIVE METABOLIC PANEL
ALBUMIN: 4.4 g/dL (ref 3.5–5.0)
ALK PHOS: 62 U/L (ref 38–126)
ALT: 15 U/L (ref 14–54)
ANION GAP: 11 (ref 5–15)
AST: 22 U/L (ref 15–41)
BUN: 9 mg/dL (ref 6–20)
CHLORIDE: 103 mmol/L (ref 101–111)
CO2: 23 mmol/L (ref 22–32)
Calcium: 9.6 mg/dL (ref 8.9–10.3)
Creatinine, Ser: 0.81 mg/dL (ref 0.44–1.00)
GFR calc non Af Amer: >60 mL/min (ref >60)
GLUCOSE: 102 mg/dL — AB (ref 65–99)
POTASSIUM: 3.4 mmol/L — AB (ref 3.5–5.1)
SODIUM: 137 mmol/L (ref 135–145)
Total Bilirubin: 0.5 mg/dL (ref 0.3–1.2)
Total Protein: 7.6 g/dL (ref 6.5–8.1)

## 2017-03-26 MED ORDER — KETOROLAC TROMETHAMINE 15 MG/ML IJ SOLN
15.0000 mg | Freq: Once | INTRAMUSCULAR | Status: AC
  Administered 2017-03-26: 15 mg via INTRAVENOUS
  Filled 2017-03-26: qty 1
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
## 2017-03-26 NOTE — ED Notes (Signed)
Pt discharged from ED; instructions provided; Pt encouraged to return to ED if symptoms worsen and to f/u with PCP; Pt verbalized understanding of all instructions 

## 2017-03-26 NOTE — ED Triage Notes (Addendum)
Pt has possible early dementia per EMS-- ems brought pt to triage
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
## 2017-03-30 ENCOUNTER — Other Ambulatory Visit: Payer: Self-pay

## 2017-03-30 NOTE — Patient Outreach (Signed)
[facility] Care Management  03/30/2017  [name] 1945-11-30 [id number]   Spoke with spouse as patient has some dementia. He verified HIPAA.  He states they are doing ok and taking tylenol for pain as needed.  He states he will be calling primary care physician for a follow up appointment today for both of them.    Discussed [facility] with him.  He declines any needs presently but is agreeable to receive letter and brochure.   Plan: RN CM will send letter and brochure and close case. RN CM will notify care management assistant of case status.  Bary Lericheionne J Alonnie Bieker, RN, MSN [facility] Care Management Coordinator Direct Line [phone] Toll Free: [phone]  Fax: [phone]

## 2017-04-04 ENCOUNTER — Encounter: Payer: Self-pay | Admitting: Family Medicine

## 2017-04-04 ENCOUNTER — Ambulatory Visit: Payer: PPO | Admitting: Family Medicine

## 2017-04-04 DIAGNOSIS — M549 Dorsalgia, unspecified: Secondary | ICD-10-CM | POA: Diagnosis not present

## 2017-04-04 NOTE — Patient Instructions (Signed)
Heat to your neck for 20 minutes 3 times per day and gentle stretching after that.  Continue on the Tylenol and you can also take the Motrin 4 pills 3 times a day if you need to
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
## 2017-04-26 ENCOUNTER — Ambulatory Visit (INDEPENDENT_AMBULATORY_CARE_PROVIDER_SITE_OTHER): Payer: PPO | Admitting: Family Medicine

## 2017-04-26 ENCOUNTER — Encounter: Payer: Self-pay | Admitting: Family Medicine

## 2017-04-26 VITALS — BP 116/60 | HR 89 | Temp 98.1°F | Resp 16 | Ht 64.0 in | Wt 129.4 lb

## 2017-04-26 DIAGNOSIS — M549 Dorsalgia, unspecified: Secondary | ICD-10-CM | POA: Diagnosis not present
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
## 2017-05-19 ENCOUNTER — Ambulatory Visit (INDEPENDENT_AMBULATORY_CARE_PROVIDER_SITE_OTHER): Payer: PPO | Admitting: Family Medicine

## 2017-05-19 DIAGNOSIS — M549 Dorsalgia, unspecified: Secondary | ICD-10-CM | POA: Diagnosis not present

## 2017-05-19 NOTE — Patient Instructions (Signed)
By mouth 3 times a day and gentle stretching after that
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
## 2017-06-16 ENCOUNTER — Other Ambulatory Visit: Payer: Self-pay

## 2017-06-16 ENCOUNTER — Ambulatory Visit: Payer: Self-pay | Admitting: Family Medicine

## 2017-06-16 ENCOUNTER — Telehealth: Payer: Self-pay

## 2017-06-16 DIAGNOSIS — I10 Essential (primary) hypertension: Secondary | ICD-10-CM

## 2017-06-16 NOTE — Telephone Encounter (Signed)
Called pt to find out which pharamcy would her HCTZ 12.5 mg needed to be sent to. No answer left voice mail. Thanks [employer]

## 2017-06-16 NOTE — Telephone Encounter (Signed)
Pt called back and said she does not need a refill at this time. Thanks [employer]

## 2017-06-17 ENCOUNTER — Other Ambulatory Visit: Payer: Self-pay

## 2017-06-17 DIAGNOSIS — I10 Essential (primary) hypertension: Secondary | ICD-10-CM

## 2017-06-17 MED ORDER — HYDROCHLOROTHIAZIDE 12.5 MG PO CAPS: 12.5000 mg | ORAL_CAPSULE | Freq: Every day | ORAL | 0 refills | Status: DC

## 2017-07-04 ENCOUNTER — Ambulatory Visit (INDEPENDENT_AMBULATORY_CARE_PROVIDER_SITE_OTHER): Payer: PPO | Admitting: Family Medicine

## 2017-07-04 ENCOUNTER — Encounter: Payer: Self-pay | Admitting: Family Medicine

## 2017-07-04 VITALS — BP 120/70 | HR 87 | Wt 130.6 lb

## 2017-07-04 DIAGNOSIS — M549 Dorsalgia, unspecified: Secondary | ICD-10-CM | POA: Diagnosis not present

## 2017-07-04 NOTE — Progress Notes (Signed)
   Subjective:    Patient ID: Amanda [name], female    DOB: 1945/06/20, 72 y.o.   MRN: [id number]  HPI She is here for a recheck.  She now states that she is essentially back to normal and only occasionally having slight headache.  When asked if she has a skin headache before she was equivocal.   Review of Systems     Objective:   Physical Exam Alert and in no distress.  Full motion of the neck.  Exam of the right trapezius did show a slight trigger point but she did not complain of pain when I pushed over it.       Assessment & Plan:  Upper back pain on left side I think she is essentially back to normal and no other further intervention is needed.

## 2018-01-09 ENCOUNTER — Ambulatory Visit: Payer: PPO | Admitting: Neurology

## 2018-01-09 ENCOUNTER — Encounter: Payer: Self-pay | Admitting: Neurology

## 2018-01-09 ENCOUNTER — Other Ambulatory Visit (INDEPENDENT_AMBULATORY_CARE_PROVIDER_SITE_OTHER): Payer: PPO

## 2018-01-09 ENCOUNTER — Other Ambulatory Visit: Payer: Self-pay

## 2018-01-09 VITALS — BP 162/90 | HR 89 | Ht 65.0 in | Wt 120.0 lb

## 2018-01-09 DIAGNOSIS — F0391 Unspecified dementia with behavioral disturbance: Secondary | ICD-10-CM | POA: Diagnosis not present

## 2018-01-09 DIAGNOSIS — F03B18 Unspecified dementia, moderate, with other behavioral disturbance: Secondary | ICD-10-CM

## 2018-01-09 LAB — VITAMIN B12: Vitamin B-12: 707 pg/mL (ref 211–911)

## 2018-01-09 LAB — TSH: TSH: 2.48 u[IU]/mL (ref 0.35–4.50)

## 2018-01-09 MED ORDER — ESCITALOPRAM OXALATE 10 MG PO TABS: 10.0000 mg | ORAL_TABLET | Freq: Every day | ORAL | 11 refills | Status: DC

## 2018-01-09 NOTE — Patient Instructions (Addendum)
1. Bloodwork for TSH, B12  Your provider requests that you have LABS drawn today.  We share a lab with [facility] - they are located in [address] (second floor) of this building.  Once you get there, please have a seat and the phlebotomist will call your name.  If you have waited more than 15 minutes, please advise the front desk  2. Recommend starting Lexapro 10mg  daily to help with your condition 3. Follow-up in 6 months or so, call for any changes  FALL PRECAUTIONS: Be cautious when walking. Scan the area for obstacles that may increase the risk of trips and falls. When getting up in the mornings, sit up at the edge of the bed for a few minutes before getting out of bed. Consider elevating the bed at the head end to avoid drop of blood pressure when getting up. Walk always in a well-lit room (use night lights in the walls). Avoid area rugs or power cords from appliances in the middle of the walkways. Use a walker or a cane if necessary and consider physical therapy for balance exercise. Get your eyesight checked regularly.  FINANCIAL OVERSIGHT: Supervision, especially oversight when making financial decisions or transactions is also recommended.  HOME SAFETY: Consider the safety of the kitchen when operating appliances like stoves, microwave oven, and blender. Consider having supervision and share cooking responsibilities until no longer able to participate in those. Accidents with firearms and other hazards in the house should be identified and addressed as well.  DRIVING: Regarding driving, in patients with progressive memory problems, driving will be impaired. We advise to have someone else do the driving if trouble finding directions or if minor accidents are reported. Independent driving assessment is available to determine safety of driving.  ABILITY TO BE LEFT ALONE: If patient is unable to contact 911 operator, consider using LifeLine, or when the need is there, arrange for  someone to stay with patients. Smoking is a fire hazard, consider supervision or cessation. Risk of wandering should be assessed by caregiver and if detected at any point, supervision and safe proof recommendations should be instituted.  MEDICATION SUPERVISION: Inability to self-administer medication needs to be constantly addressed. Implement a mechanism to ensure safe administration of the medications.  RECOMMENDATIONS FOR ALL PATIENTS WITH MEMORY PROBLEMS: 1. Continue to exercise (Recommend 30 minutes of walking everyday, or 3 hours every week) 2. Increase social interactions - continue going to [place] and enjoy social gatherings with friends and family 3. Eat healthy, avoid fried foods and eat more fruits and vegetables 4. Maintain adequate blood pressure, blood sugar, and blood cholesterol level. Reducing the risk of stroke and cardiovascular disease also helps promoting better memory. 5. Avoid stressful situations. Live a simple life and avoid aggravations. Organize your time and prepare for the next day in anticipation. 6. Sleep well, avoid any interruptions of sleep and avoid any distractions in the bedroom that may interfere with adequate sleep quality 7. Avoid sugar, avoid sweets as there is a strong link between excessive sugar intake, diabetes, and cognitive impairment The Mediterranean diet has been shown to help patients reduce the risk of progressive memory disorders and reduces cardiovascular risk. This includes eating fish, eat fruits and green leafy vegetables, nuts like almonds and hazelnuts, walnuts, and also use olive oil. Avoid fast foods and fried foods as much as possible. Avoid sweets and sugar as sugar use has been linked to worsening of memory function.  There is always a concern of gradual progression  of memory problems. If this is the case, then we may need to adjust level of care according to patient needs. Support, both to the patient and caregiver, should then be put  into place.

## 2018-01-09 NOTE — Progress Notes (Signed)
NEUROLOGY CONSULTATION NOTE  Amanda [name] Grams MRN: [id number] DOB: 11-07-1945  Referring provider: Dr. Sharlot GowdaJohn Lalonde Primary care provider: Dr. Sharlot GowdaJohn Lalonde  Reason for consult:  Memory loss  Dear Dr Susann GivensLalonde:  Thank you for your kind referral of Amanda [name] Crymes for consultation of the above symptoms. Although her history is well known to you, please allow me to reiterate it for the purpose of our medical record. The patient was accompanied to the clinic by her husband and daughter from [place] who also provide collateral information. Records and images were personally reviewed where available.  HISTORY OF PRESENT ILLNESS: This is a 72 year old right-handed woman with a history of hypertension, seen one time in June 2016 for memory loss, presenting for re-evaluation. On her initial visit in 2016, she was accompanied by her son and was noted to be upset with her son at that time because she felt there was nothing wrong. She reported being independent in all ADLs and able to do complex tasks. MMSE in June 2016 was 23/30, however since she was so upset, mood was considered as cause of memory issues. She presents today with minimal insight into her condition, she states her memory "I believe is good." She lives with her husband. Over the course of the visit, she becomes increasingly agitated and upset with her husband, and states she does not know the woman with them today ("a friend of his"). I spoke to her daughter separately after, who sees the patient 3-4 times a year. Her daughter reports that family noticed more behavioral changes. She states she has never been to her daughter's house. When asked about bills, she states "he tries to be the manager." He took over bills after he was unable to find a bill in the mail. She then states "he thinks he can do a better job than me, he has not been in the house long." She does not take any medications. She denies getting lost driving, then reports  her husband drives because she is not good with directions, husband would give her directions in unfamiliar places. She reports mood and sleep are good. Her husband states she is not constantly irritable, mostly in the evenings, however her daughter states it different. She is good with routine in her own home, but has difficulty in new situations. When her husband states she is a little more irritated in the morning, she states "I think you got that wrong." She denies anyheadaches, dizziness, diplopia, dysarthria, dysphagia, neck/back pain, focal numbness/tingling/weakness, bowel/bladder dysfunction. No anosmia, tremors, no falls. She denies any significant head injuries or alcohol use. Her mother had dementia in her 6880s. 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   She had an MRI brain in 09/2014 for memory loss, no acute changes, there was mild diffuse atrophy and mild chronic microvascular disease. TSH and B12 in 2016 were normal.   PAST MEDICAL HISTORY: Past Medical History:  Diagnosis Date  . Dyslipidemia     PAST SURGICAL HISTORY: No past surgical history on file.  MEDICATIONS: Current Outpatient Medications on File Prior to Visit  Medication Sig Dispense Refill  . hydrochlorothiazide (MICROZIDE) 12.5 MG capsule Take 1 capsule (12.5 mg total) by mouth daily. (Patient not taking: Reported on 01/09/2018) 90 capsule 0   No current facility-administered medications on file prior to visit.     ALLERGIES: Allergies  Allergen Reactions  . Codeine     FAMILY HISTORY: Family History  Problem Relation Age of Onset  .  Dementia Mother   . Heart disease Maternal Grandmother     SOCIAL HISTORY: Social History   Socioeconomic History  . Marital status: Married    Spouse name: Not on file  . Number of children: 2  . Years of education: Not on file  . Highest education level: Not on file  Occupational History  . Occupation: Retired  Engineer, production  . Financial resource strain: Not on  file  . Food insecurity:    Worry: Not on file    Inability: Not on file  . Transportation needs:    Medical: Not on file    Non-medical: Not on file  Tobacco Use  . Smoking status: Never Smoker  . Smokeless tobacco: Never Used  Substance and Sexual Activity  . Alcohol use: No    Alcohol/week: 0.0 standard drinks  . Drug use: No  . Sexual activity: Yes  Lifestyle  . Physical activity:    Days per week: Not on file    Minutes per session: Not on file  . Stress: Not on file  Relationships  . Social connections:    Talks on phone: Not on file    Gets together: Not on file    Attends religious service: Not on file    Active member of club or organization: Not on file    Attends meetings of clubs or organizations: Not on file    Relationship status: Not on file  . Intimate partner violence:    Fear of current or ex partner: Not on file    Emotionally abused: Not on file    Physically abused: Not on file    Forced sexual activity: Not on file  Other Topics Concern  . Not on file  Social History Narrative   Pt lives in 2 story home with her husband and children   Has 2 adult children   12th grade education    REVIEW OF SYSTEMS: Constitutional: No fevers, chills, or sweats, no generalized fatigue, change in appetite Eyes: No visual changes, double vision, eye pain Ear, nose and throat: No hearing loss, ear pain, nasal congestion, sore throat Cardiovascular: No chest pain, palpitations Respiratory:  No shortness of breath at rest or with exertion, wheezes GastrointestinaI: No nausea, vomiting, diarrhea, abdominal pain, fecal incontinence Genitourinary:  No dysuria, urinary retention or frequency Musculoskeletal:  No neck pain, back pain Integumentary: No rash, pruritus, skin lesions Neurological: as above Psychiatric: No depression, insomnia, anxiety Endocrine: No palpitations, fatigue, diaphoresis, mood swings, change in appetite, change in weight, increased  thirst Hematologic/Lymphatic:  No anemia, purpura, petechiae. Allergic/Immunologic: no itchy/runny eyes, nasal congestion, recent allergic reactions, rashes  PHYSICAL EXAM: Vitals:   01/09/18 1338  BP: (!) 162/90  Pulse: 89  SpO2: 98%   General: No acute distress, agitated during most of the visit Head:  Normocephalic/atraumatic Eyes: Fundoscopic exam shows bilateral sharp discs, no vessel changes, exudates, or hemorrhages Neck: supple, no paraspinal tenderness, full range of motion Back: No paraspinal tenderness Heart: regular rate and rhythm Lungs: Clear to auscultation bilaterally. Vascular: No carotid bruits. Skin/Extremities: No rash, no edema Neurological Exam: Mental status: alert and oriented to person, place, no dysarthria or aphasia, Fund of knowledge is reduced.  Recent and remote memory are impaired.  Attention and concentration are reduced. Able to name objects and repeat phrases.  MMSE - Mini Mental State Exam 01/09/2018 10/02/2014  Orientation to time 1 2  Orientation to Place 4 5  Registration 3 3  Attention/ Calculation 2 4  Recall 0 0  Language- name 2 objects 2 2  Language- repeat 1 1  Language- follow 3 step command 1 3  Language- read & follow direction 0 1  Write a sentence 0 1  Copy design 0 1  Total score 14 23   Cranial nerves: CN I: not tested CN II: pupils equal, round and reactive to light, visual fields intact, fundi unremarkable. CN III, IV, VI:  full range of motion, no nystagmus, no ptosis CN V: facial sensation intact CN VII: upper and lower face symmetric CN VIII: hearing intact to finger rub CN IX, X: gag intact, uvula midline CN XI: sternocleidomastoid and trapezius muscles intact CN XII: tongue midline Bulk & Tone: normal, no fasciculations. Motor: 5/5 throughout with no pronator drift. Sensation: intact to light touch, cold, pin, vibration and joint position sense.  No extinction to double simultaneous stimulation.  Romberg test  negative Deep Tendon Reflexes: +2 throughout, no ankle clonus Plantar responses: downgoing bilaterally Cerebellar: no incoordination on finger to nose, heel to shin. No dysdiadochokinesia Gait: narrow-based and steady, able to tandem walk adequately. Tremor: none  IMPRESSION: This is a 72 year old right-handed woman with a history of diet-controlled hypertension, presenting for worsening memory. She was initially seen in 2016, MMSE 23/30 at that time. She was very upset at that time, and is again quite agitated and upset today, but does not recognize her daughter now. MMSE today 14/30. MRI brain in 2016 no acute changes. 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 She wanted to leave several times during the visit, she has no insight into her condition. Recommendations were discussed with the patient and separately with her family, she refuses to take any medication. I discussed starting an SSRI with her husband, he would like a prescription and will try to convince her to take medication. Check TSH and B12. Recommended no further driving, continue 16/1 care. Follow-up in 6 months, they know to call for any changes.   Thank you for allowing me to participate in the care of this patient. Please do not hesitate to call for any questions or concerns.   Patrcia Dolly, M.D.  CC: Dr. Susann Givens

## 2018-01-11 ENCOUNTER — Telehealth: Payer: Self-pay

## 2018-01-11 NOTE — Telephone Encounter (Signed)
-----   Message from Van ClinesKaren M Aquino, MD sent at 01/10/2018  1:39 PM EDT ----- Pls let husband know the B12 and thyroid blood tests were normal, thanks

## 2018-01-11 NOTE — Telephone Encounter (Signed)
Called phone number listed as husband's call.  Pt answered.  I identified myself and told her that I was calling in regards to the lab work she had done on 01/09/18.  Pt stated that she did not have lab work done and that I must have dialed the wrong number.  I verified the name, DOB and address with pt.  I again said that I was calling in regards to labwork done on Monday.  Pt stated "I already told you that I did not have any blood work done this week" and hung up.  Was unable to relay results.

## 2018-01-15 ENCOUNTER — Encounter: Payer: Self-pay | Admitting: Neurology

## 2018-08-10 ENCOUNTER — Encounter: Payer: Self-pay | Admitting: Family Medicine

## 2018-08-29 ENCOUNTER — Ambulatory Visit: Payer: Self-pay | Admitting: Neurology

## 2018-09-20 ENCOUNTER — Encounter: Payer: Self-pay | Admitting: Neurology

## 2018-09-29 ENCOUNTER — Ambulatory Visit: Payer: Self-pay | Admitting: Neurology

## 2018-11-13 ENCOUNTER — Telehealth: Payer: Self-pay | Admitting: Neurology

## 2018-11-13 NOTE — Telephone Encounter (Signed)
Husband is calling in about the lexapro medication and wanting to see if that comes in a liquid form.  She is having a hard time taking it. Thanks!

## 2018-11-14 NOTE — Telephone Encounter (Signed)
Called patient's pharmacy (see previous note from husband) and the Lexapro does come in a liquid form (5mg /16ml). Is it okay to send in a new script for the Lexapro 10mg  daily in a liquid form? Thanks.

## 2018-11-15 ENCOUNTER — Ambulatory Visit: Payer: PPO | Admitting: Neurology

## 2018-11-15 MED ORDER — ESCITALOPRAM OXALATE 5 MG/5ML PO SOLN: 10.0000 mg | Freq: Every day | ORAL | 11 refills | Status: DC

## 2018-11-15 NOTE — Telephone Encounter (Signed)
Ok to send for Rx, thanks!

## 2018-11-15 NOTE — Telephone Encounter (Signed)
Called and let husband know that liquid form of Lexapro was sent in to the [facility] [address]. He verbalized understanding if he switches to the liquid form to not give any more of the pills as the same medicine.

## 2018-11-22 ENCOUNTER — Encounter: Payer: Self-pay | Admitting: Neurology

## 2018-11-22 ENCOUNTER — Ambulatory Visit (INDEPENDENT_AMBULATORY_CARE_PROVIDER_SITE_OTHER): Payer: PPO | Admitting: Neurology

## 2018-11-22 ENCOUNTER — Other Ambulatory Visit: Payer: Self-pay

## 2018-11-22 VITALS — BP 157/75 | HR 66 | Ht 65.0 in | Wt 110.0 lb

## 2018-11-22 DIAGNOSIS — F03B18 Unspecified dementia, moderate, with other behavioral disturbance: Secondary | ICD-10-CM

## 2018-11-22 DIAGNOSIS — F0391 Unspecified dementia with behavioral disturbance: Secondary | ICD-10-CM

## 2018-11-22 MED ORDER — DONEPEZIL HCL 5 MG PO TABS: 5.0000 mg | ORAL_TABLET | Freq: Every day | ORAL | 0 refills | Status: DC

## 2018-11-22 NOTE — Patient Instructions (Signed)
1. Start Donepezil 5mg  every night. This can help slow down the worsening of memory   2. Continue Lexapro every night  3. Follow-up in 6 months, call for any changes  FALL PRECAUTIONS: Be cautious when walking. Scan the area for obstacles that may increase the risk of trips and falls. When getting up in the mornings, sit up at the edge of the bed for a few minutes before getting out of bed. Consider elevating the bed at the head end to avoid drop of blood pressure when getting up. Walk always in a well-lit room (use night lights in the walls). Avoid area rugs or power cords from appliances in the middle of the walkways. Use a walker or a cane if necessary and consider physical therapy for balance exercise. Get your eyesight checked regularly.  FINANCIAL OVERSIGHT: Supervision, especially oversight when making financial decisions or transactions is also recommended.  HOME SAFETY: Consider the safety of the kitchen when operating appliances like stoves, microwave oven, and blender. Consider having supervision and share cooking responsibilities until no longer able to participate in those. Accidents with firearms and other hazards in the house should be identified and addressed as well.  DRIVING: Regarding driving, in patients with progressive memory problems, driving will be impaired. We advise to have someone else do the driving if trouble finding directions or if minor accidents are reported. Independent driving assessment is available to determine safety of driving.  ABILITY TO BE LEFT ALONE: If patient is unable to contact 911 operator, consider using LifeLine, or when the need is there, arrange for someone to stay with patients. Smoking is a fire hazard, consider supervision or cessation. Risk of wandering should be assessed by caregiver and if detected at any point, supervision and safe proof recommendations should be instituted.  MEDICATION SUPERVISION: Inability to self-administer medication  needs to be constantly addressed. Implement a mechanism to ensure safe administration of the medications.  RECOMMENDATIONS FOR ALL PATIENTS WITH MEMORY PROBLEMS: 1. Continue to exercise (Recommend 30 minutes of walking everyday, or 3 hours every week) 2. Increase social interactions - continue going to [place] and enjoy social gatherings with friends and family 3. Eat healthy, avoid fried foods and eat more fruits and vegetables 4. Maintain adequate blood pressure, blood sugar, and blood cholesterol level. Reducing the risk of stroke and cardiovascular disease also helps promoting better memory. 5. Avoid stressful situations. Live a simple life and avoid aggravations. Organize your time and prepare for the next day in anticipation. 6. Sleep well, avoid any interruptions of sleep and avoid any distractions in the bedroom that may interfere with adequate sleep quality 7. Avoid sugar, avoid sweets as there is a strong link between excessive sugar intake, diabetes, and cognitive impairment We discussed the Mediterranean diet, which has been shown to help patients reduce the risk of progressive memory disorders and reduces cardiovascular risk. This includes eating fish, eat fruits and green leafy vegetables, nuts like almonds and hazelnuts, walnuts, and also use olive oil. Avoid fast foods and fried foods as much as possible. Avoid sweets and sugar as sugar use has been linked to worsening of memory function.  There is always a concern of gradual progression of memory problems. If this is the case, then we may need to adjust level of care according to patient needs. Support, both to the patient and caregiver, should then be put into place.

## 2018-11-22 NOTE — Progress Notes (Signed)
NEUROLOGY FOLLOW UP OFFICE NOTE  SAMA ARAUZ [id number] 01/06/1946  HISTORY OF PRESENT ILLNESS: I had the pleasure of seeing Bernardina Cacho in follow-up in the neurology clinic on 11/22/2018.  The patient was last seen a year ago for moderate dementia with behavioral disturbance. Her husband is present during the visit, I spoke to her son in [place] on speakerphone. MMSE 14/30 in September 2019. She has no insight into her condition and on last visit got very agitated and upset, she refused to start any medication. I had discussed starting an SSRI with her husband, he reports that she took the Lexapro but told him it did not taste good. He reports that she is fine in the morning, pleasant, then starts getting more agitated in the evening hours. She states her memory is good. She says mood is okay but admits to getting irritable. She lives with her husband, who manages finances and medication. She does not drive. She is independent with dressing and bathing. No hallucinations. Sleep is good, no wandering behavior. Family concerned about weight loss despite eating 2-3 meals a day.   History on Initial Assessment 01/09/2018: This is a 73 year old right-handed woman with a history of hypertension, seen one time in June 2016 for memory loss, presenting for re-evaluation. On her initial visit in 2016, she was accompanied by her son and was noted to be upset with her son at that time because she felt there was nothing wrong. She reported being independent in all ADLs and able to do complex tasks. MMSE in June 2016 was 23/30, however since she was so upset, mood was considered as cause of memory issues. She presents today with minimal insight into her condition, she states her memory "I believe is good." She lives with her husband. Over the course of the visit, she becomes increasingly agitated and upset with her husband, and states she does not know the woman with them today ("a friend of his"). I spoke  to her daughter separately after, who sees the patient 3-4 times a year. Her daughter reports that family noticed more behavioral changes. She states she has never been to her daughter's house. When asked about bills, she states "he tries to be the manager." He took over bills after he was unable to find a bill in the mail. She then states "he thinks he can do a better job than me, he has not been in the house long." She does not take any medications. She denies getting lost driving, then reports her husband drives because she is not good with directions, husband would give her directions in unfamiliar places. She reports mood and sleep are good. Her husband states she is not constantly irritable, mostly in the evenings, however her daughter states it different. She is good with routine in her own home, but has difficulty in new situations. When her husband states she is a little more irritated in the morning, she states "I think you got that wrong." She denies anyheadaches, dizziness, diplopia, dysarthria, dysphagia, neck/back pain, focal numbness/tingling/weakness, bowel/bladder dysfunction. No anosmia, tremors, no falls. She denies any significant head injuries or alcohol use. Her mother had dementia in her 27s. 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   She had an MRI brain in 09/2014 for memory loss, no acute changes, there was mild diffuse atrophy and mild chronic microvascular disease. TSH and B12 in 2016 were normal.  PAST MEDICAL HISTORY: Past Medical History:  Diagnosis Date  . Dementia ([place])   .  Dyslipidemia     MEDICATIONS: Current Outpatient Medications on File Prior to Visit  Medication Sig Dispense Refill  . escitalopram (LEXAPRO) 5 MG/5ML solution Take 10 mLs (10 mg total) by mouth daily. 300 mL 11  . hydrochlorothiazide (MICROZIDE) 12.5 MG capsule Take 1 capsule (12.5 mg total) by mouth daily. (Patient not taking: Reported on 01/09/2018) 90 capsule 0   No current  facility-administered medications on file prior to visit.     ALLERGIES: Allergies  Allergen Reactions  . Codeine     FAMILY HISTORY: Family History  Problem Relation Age of Onset  . Dementia Mother   . Heart disease Maternal Grandmother     SOCIAL HISTORY: Social History   Socioeconomic History  . Marital status: Married    Spouse name: Not on file  . Number of children: 2  . Years of education: Not on file  . Highest education level: Not on file  Occupational History  . Occupation: Retired  Engineer, productionocial Needs  . Financial resource strain: Not on file  . Food insecurity    Worry: Not on file    Inability: Not on file  . Transportation needs    Medical: Not on file    Non-medical: Not on file  Tobacco Use  . Smoking status: Never Smoker  . Smokeless tobacco: Never Used  Substance and Sexual Activity  . Alcohol use: No    Alcohol/week: 0.0 standard drinks  . Drug use: No  . Sexual activity: Yes    Partners: Male  Lifestyle  . Physical activity    Days per week: Not on file    Minutes per session: Not on file  . Stress: Not on file  Relationships  . Social Musicianconnections    Talks on phone: Not on file    Gets together: Not on file    Attends religious service: Not on file    Active member of club or organization: Not on file    Attends meetings of clubs or organizations: Not on file    Relationship status: Not on file  . Intimate partner violence    Fear of current or ex partner: Not on file    Emotionally abused: Not on file    Physically abused: Not on file    Forced sexual activity: Not on file  Other Topics Concern  . Not on file  Social History Narrative   Pt lives in 2 story home with her husband and children   Has 2 adult children   12th grade education    REVIEW OF SYSTEMS: Constitutional: No fevers, chills, or sweats, no generalized fatigue, change in appetite Eyes: No visual changes, double vision, eye pain Ear, nose and throat: No hearing loss,  ear pain, nasal congestion, sore throat Cardiovascular: No chest pain, palpitations Respiratory:  No shortness of breath at rest or with exertion, wheezes GastrointestinaI: No nausea, vomiting, diarrhea, abdominal pain, fecal incontinence Genitourinary:  No dysuria, urinary retention or frequency Musculoskeletal:  No neck pain, back pain Integumentary: No rash, pruritus, skin lesions Neurological: as above Psychiatric: No depression, insomnia, anxiety Endocrine: No palpitations, fatigue, diaphoresis, mood swings, change in appetite, change in weight, increased thirst Hematologic/Lymphatic:  No anemia, purpura, petechiae. Allergic/Immunologic: no itchy/runny eyes, nasal congestion, recent allergic reactions, rashes  PHYSICAL EXAM: Vitals:   11/22/18 0941  BP: (!) 157/75  Pulse: 66  SpO2: 99%   General: No acute distress Head:  Normocephalic/atraumatic Skin/Extremities: No rash, no edema Neurological Exam: alert and oriented to person, city. Month  is Sept, year 12, day 12. No aphasia or dysarthria. Fund of knowledge is reduced.  Recent and remote memory are impaired.  Attention and concentration are reduced. Able to name objects, difficulty with repetition. Cranial nerves: Pupils equal, round, reactive to light. Extraocular movements intact with no nystagmus. Visual fields full.No facial asymmetry. Tongue, uvula, palate midline.  Motor: Bulk and tone normal, muscle strength 5/5 throughout with no pronator drift.  Deep tendon reflexes 2+ throughout, toes downgoing.  Finger to nose testing intact.  Gait narrow-based and steady, able to tandem walk adequately.  Romberg negative.  IMPRESSION: This is a 73 yo RH woman with a history of diet-controlled hypertension, here for follow-up of moderate dementia with behavioral disturbance, likely Alzheimer's disease. Findings were discussed separately with the husband and son, as she gets agitated easily and has no insight into her condition. We  discussed the diagnosis, prognosis, resources and educational material provided for family today regarding diagnosis and dealing with behaviors associated with dementia. Husband will try to give the Lexapro liquid formulation in applesauce. We discussed starting Donepezil, including expectations from medication and side effects, she thinks her memory is fine but was advised that prescription will be at pharmacy if she is willing to try it. She does not drive. Discussed weight loss, although this may relate to underlying dementia, advised to speak to PCP as well to rule out other causes. Advised adding Ensure/Boost to meals. Follow-up in 6 months, they know to call for any changes.   Thank you for allowing me to participate in her care.  Please do not hesitate to call for any questions or concerns.  The duration of this appointment visit was 30 minutes of face-to-face time with the patient.  Greater than 50% of this time was spent in counseling, explanation of diagnosis, planning of further management, and coordination of care.   Patrcia DollyKaren Musab Wingard, M.D.   CC: Dr. Susann GivensLalonde

## 2019-01-30 DIAGNOSIS — R42 Dizziness and giddiness: Secondary | ICD-10-CM | POA: Diagnosis not present

## 2019-01-30 DIAGNOSIS — I10 Essential (primary) hypertension: Secondary | ICD-10-CM | POA: Diagnosis not present

## 2019-06-07 ENCOUNTER — Telehealth: Payer: Self-pay

## 2019-06-07 NOTE — Telephone Encounter (Signed)
PT husband advised they would schedule their AWV after the pandemic. Amanda [name]

## 2019-06-22 ENCOUNTER — Ambulatory Visit: Payer: PPO | Admitting: Neurology

## 2019-09-28 DIAGNOSIS — M79671 Pain in right foot: Secondary | ICD-10-CM | POA: Diagnosis not present

## 2019-09-28 DIAGNOSIS — M79672 Pain in left foot: Secondary | ICD-10-CM | POA: Diagnosis not present

## 2019-10-01 DIAGNOSIS — M79672 Pain in left foot: Secondary | ICD-10-CM | POA: Diagnosis not present

## 2019-10-01 DIAGNOSIS — B351 Tinea unguium: Secondary | ICD-10-CM | POA: Diagnosis not present

## 2019-10-01 DIAGNOSIS — M79671 Pain in right foot: Secondary | ICD-10-CM | POA: Diagnosis not present

## 2019-10-01 DIAGNOSIS — R600 Localized edema: Secondary | ICD-10-CM | POA: Diagnosis not present

## 2019-10-09 ENCOUNTER — Encounter: Payer: Self-pay | Admitting: Neurology

## 2019-10-18 ENCOUNTER — Ambulatory Visit (INDEPENDENT_AMBULATORY_CARE_PROVIDER_SITE_OTHER): Payer: PPO | Admitting: Family Medicine

## 2019-10-18 ENCOUNTER — Encounter: Payer: Self-pay | Admitting: Family Medicine

## 2019-10-18 ENCOUNTER — Other Ambulatory Visit: Payer: Self-pay

## 2019-10-18 VITALS — BP 142/70 | HR 68 | Temp 98.0°F | Wt 109.2 lb

## 2019-10-18 DIAGNOSIS — I10 Essential (primary) hypertension: Secondary | ICD-10-CM

## 2019-10-18 DIAGNOSIS — F03B0 Unspecified dementia, moderate, without behavioral disturbance, psychotic disturbance, mood disturbance, and anxiety: Secondary | ICD-10-CM

## 2019-10-18 DIAGNOSIS — R609 Edema, unspecified: Secondary | ICD-10-CM

## 2019-10-18 DIAGNOSIS — E785 Hyperlipidemia, unspecified: Secondary | ICD-10-CM | POA: Diagnosis not present

## 2019-10-18 DIAGNOSIS — F0390 Unspecified dementia without behavioral disturbance: Secondary | ICD-10-CM

## 2019-10-18 NOTE — Progress Notes (Signed)
   Subjective:    Patient ID: Amanda [name], female    DOB: 30-Jun-1945, 74 y.o.   MRN: [id number]  HPI She is here with her husband.  The main concern today is difficulty with lower extremity swelling that has been present for the last several months.  There is no associated chest pain shortness of breath, PND, DOE.  She was on HCTZ in the past but has stopped that.  She also has stopped all of her medications and was Quist, she and her husband could not explain why all of them were stopped.   Review of Systems     Objective:   Physical Exam Alert and in no distress.  Cardiac exam shows regular rhythm without murmurs or gallops.  Lungs are clear to auscultation.  Lower extremities does show 2+ pitting edema. EKG did show some slight baseline interference with good R wave progression.  Other parameters were all normal.       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  I did recommend that they get support stockings to help and put them on before she gets out of the bed in the morning. The systolic pressure was slightly elevated but at this point we will monitor the situation. Over 30 minutes spent discussing this with her and her husband. She is also to follow-up with neurology for further evaluation.

## 2019-10-19 LAB — CBC WITH DIFFERENTIAL/PLATELET
Basophils Absolute: 0 10*3/uL (ref 0.0–0.2)
Basos: 1 %
EOS (ABSOLUTE): 0.2 10*3/uL (ref 0.0–0.4)
Eos: 3 %
Hematocrit: 41.1 % (ref 34.0–46.6)
Hemoglobin: 13.4 g/dL (ref 11.1–15.9)
Immature Grans (Abs): 0 10*3/uL (ref 0.0–0.1)
Immature Granulocytes: 0 %
Lymphocytes Absolute: 1.4 10*3/uL (ref 0.7–3.1)
Lymphs: 21 %
MCH: 28.8 pg (ref 26.6–33.0)
MCHC: 32.6 g/dL (ref 31.5–35.7)
MCV: 88 fL (ref 79–97)
Monocytes Absolute: 0.5 10*3/uL (ref 0.1–0.9)
Monocytes: 7 %
Neutrophils Absolute: 4.5 10*3/uL (ref 1.4–7.0)
Neutrophils: 68 %
Platelets: 241 10*3/uL (ref 150–450)
RBC: 4.66 x10E6/uL (ref 3.77–5.28)
RDW: 12.5 % (ref 11.7–15.4)
WBC: 6.7 10*3/uL (ref 3.4–10.8)

## 2019-10-19 LAB — LIPID PANEL
Chol/HDL Ratio: 2.5 ratio (ref 0.0–4.4)
Cholesterol, Total: 234 mg/dL — ABNORMAL HIGH (ref 100–199)
HDL: 92 mg/dL (ref >39)
LDL Chol Calc (NIH): 135 mg/dL — ABNORMAL HIGH (ref 0–99)
Triglycerides: 45 mg/dL (ref 0–149)
VLDL Cholesterol Cal: 7 mg/dL (ref 5–40)

## 2019-10-19 LAB — COMPREHENSIVE METABOLIC PANEL
ALT: 22 IU/L (ref 0–32)
AST: 21 IU/L (ref 0–40)
Albumin/Globulin Ratio: 1.8 (ref 1.2–2.2)
Albumin: 4.7 g/dL (ref 3.7–4.7)
Alkaline Phosphatase: 61 IU/L (ref 48–121)
BUN/Creatinine Ratio: 23 (ref 12–28)
BUN: 17 mg/dL (ref 8–27)
Bilirubin Total: 0.3 mg/dL (ref 0.0–1.2)
CO2: 26 mmol/L (ref 20–29)
Calcium: 9.9 mg/dL (ref 8.7–10.3)
Chloride: 104 mmol/L (ref 96–106)
Creatinine, Ser: 0.75 mg/dL (ref 0.57–1.00)
GFR calc Af Amer: 91 mL/min/{1.73_m2} (ref >59)
GFR calc non Af Amer: 79 mL/min/{1.73_m2} (ref >59)
Globulin, Total: 2.6 g/dL (ref 1.5–4.5)
Glucose: 81 mg/dL (ref 65–99)
Potassium: 4.2 mmol/L (ref 3.5–5.2)
Sodium: 143 mmol/L (ref 134–144)
Total Protein: 7.3 g/dL (ref 6.0–8.5)

## 2019-10-30 ENCOUNTER — Ambulatory Visit: Payer: PPO | Admitting: Sports Medicine

## 2019-11-01 ENCOUNTER — Ambulatory Visit: Payer: PPO | Admitting: Podiatry

## 2019-11-01 ENCOUNTER — Other Ambulatory Visit: Payer: Self-pay

## 2019-11-01 ENCOUNTER — Encounter: Payer: Self-pay | Admitting: Podiatry

## 2019-11-01 VITALS — BP 138/76 | HR 79

## 2019-11-01 DIAGNOSIS — M79674 Pain in right toe(s): Secondary | ICD-10-CM

## 2019-11-01 DIAGNOSIS — B351 Tinea unguium: Secondary | ICD-10-CM | POA: Diagnosis not present

## 2019-11-01 DIAGNOSIS — B353 Tinea pedis: Secondary | ICD-10-CM | POA: Diagnosis not present

## 2019-11-01 DIAGNOSIS — R600 Localized edema: Secondary | ICD-10-CM | POA: Diagnosis not present

## 2019-11-01 DIAGNOSIS — M79675 Pain in left toe(s): Secondary | ICD-10-CM | POA: Diagnosis not present

## 2019-11-01 MED ORDER — KETOCONAZOLE 2 % EX CREA: 1.0000 "application " | TOPICAL_CREAM | Freq: Every day | CUTANEOUS | 2 refills | Status: DC

## 2019-11-01 NOTE — Progress Notes (Signed)
  Subjective:  Patient ID: Amanda [name], female    DOB: 1946/02/27,  MRN: [id number]  Chief Complaint  Patient presents with  . Leg Swelling    bilateral feet  . Nail Problem    thick painful toenails    74 y.o. female presents with the above complaint. History confirmed with patient.  She is here today with her children as well as her husband.  She has dementia and she lives with her husband.  Patient's family states that the toenails are thickened and elongated and cause discomfort.  They saw another podiatrist in the area and have transferred her care here for treatment.  They also noted dry scaling skin on both feet.  She also has swelling that has slowly been getting worse.  She does walk around the house doing her activities of daily living, and they had purchase compression stockings from their other provider.  Objective:  Physical Exam: warm, good capillary refill, no trophic changes or ulcerative lesions, normal DP and PT pulses, normal sensory exam, onychomycosis, tinea pedis and +2 pitting edema present and generalized throughout the bilateral lower extremities.  Assessment:   1. Tinea pedis of both feet   2. Onychomycosis   3. Pain due to onychomycosis of toenails of both feet   4. Edema of both lower extremities      Plan:  Patient was evaluated and treated and all questions answered.  Discussed the etiology and treatment options for tinea pedis.  Discussed topical and oral treatment.  Recommended topical treatment with 2% ketoconazole cream.  This was sent to the patient's pharmacy.    Discussed the etiology and treatment options for the condition in detail with the patient. Educated patient on the topical and oral treatment options for mycotic nails. Recommended debridement of the nails today. Sharp and mechanical debridement performed of all painful and mycotic nails today. Nails debrided in length and thickness using a nail nipper and a mechanical burr to level  of comfort. Discussed treatment options including appropriate shoe gear. Follow up as needed for painful nails.  We also discussed compression stockings and other treatments for dependent edema.  She recently saw her PCP and did noted no issues with her kidneys, heart, or blood pressure.  I recommend that they begin with a low gradient compression stockings.  I share the concern with the family that she may not wear them as consistently she needs to.  I also recommended elevation while sitting and walking.  Return in about 3 months (around 02/01/2020) for nail trim.

## 2019-11-01 NOTE — Patient Instructions (Signed)
Athlete's Foot  Athlete's foot (tinea pedis) is a fungal infection of the skin on your feet. It often occurs on the skin that is between or underneath your toes. It can also occur on the soles of your feet. Symptoms include itchy or white and flaky areas on the skin. The infection can spread from person to person (is contagious). It can also spread when a person's bare feet come in contact with the fungus on shower floors or on items such as shoes. Follow these instructions at home: Medicines  Apply or take over-the-counter and prescription medicines only as told by your doctor.  Apply your antifungal medicine as told by your doctor. Do not stop using the medicine even if your feet start to get better. Foot care  Do not scratch your feet.  Keep your feet dry: ? Wear cotton or wool socks. Change your socks every day or if they become wet. ? Wear shoes that allow air to move around, such as sandals or canvas tennis shoes.  Wash and dry your feet: ? Every day or as told by your doctor. ? After exercising. ? Including the area between your toes. General instructions  Do not share any of these items that touch your feet: ? Towels. ? Shoes. ? Nail clippers. ? Other personal items.  Protect your feet by wearing sandals in wet areas, such as locker rooms and shared showers.  Keep all follow-up visits as told by your doctor. This is important.  If you have diabetes, keep your blood sugar under control. Contact a doctor if:  You have a fever.  You have swelling, pain, warmth, or redness in your foot.  Your feet are not getting better with treatment.  Your symptoms get worse.  You have new symptoms. Summary  Athlete's foot is a fungal infection of the skin on your feet.  Symptoms include itchy or white and flaky areas on the skin.  Apply your antifungal medicine as told by your doctor.  Keep your feet clean and dry. This information is not intended to replace advice given  to you by your health care provider. Make sure you discuss any questions you have with your health care provider. Document Revised: 03/31/2017 Document Reviewed: 01/24/2017 Elsevier Patient Education  2020 [employer]  Peripheral Edema  Peripheral edema is swelling that is caused by a buildup of fluid. Peripheral edema most often affects the lower legs, ankles, and feet. It can also develop in the arms, hands, and face. The area of the body that has peripheral edema will look swollen. It may also feel heavy or warm. Your clothes may start to feel tight. Pressing on the area may make a temporary dent in your skin. You may not be able to move your swollen arm or leg as much as usual. There are many causes of peripheral edema. It can happen because of a complication of other conditions such as congestive heart failure, kidney disease, or a problem with your blood circulation. It also can be a side effect of certain medicines or because of an infection. It often happens to women during pregnancy. Sometimes, the cause is not known. Follow these instructions at home: Managing pain, stiffness, and swelling   Raise (elevate) your legs while you are sitting or lying down.  Move around often to prevent stiffness and to lessen swelling.  Do not sit or stand for long periods of time.  Wear support stockings as told by your health care provider. Medicines  Take over-the-counter  and prescription medicines only as told by your health care provider.  Your health care provider may prescribe medicine to help your body get rid of excess water (diuretic). General instructions  Pay attention to any changes in your symptoms.  Follow instructions from your health care provider about limiting salt (sodium) in your diet. Sometimes, eating less salt may reduce swelling.  Moisturize skin daily to help prevent skin from cracking and draining.  Keep all follow-up visits as told by your health care provider.  This is important. Contact a health care provider if you have:  A fever.  Edema that starts suddenly or is getting worse, especially if you are pregnant or have a medical condition.  Swelling in only one leg.  Increased swelling, redness, or pain in one or both of your legs.  Drainage or sores at the area where you have edema. Get help right away if you:  Develop shortness of breath, especially when you are lying down.  Have pain in your chest or abdomen.  Feel weak.  Feel faint. Summary  Peripheral edema is swelling that is caused by a buildup of fluid. Peripheral edema most often affects the lower legs, ankles, and feet.  Move around often to prevent stiffness and to lessen swelling. Do not sit or stand for long periods of time.  Pay attention to any changes in your symptoms.  Contact a health care provider if you have edema that starts suddenly or is getting worse, especially if you are pregnant or have a medical condition.  Get help right away if you develop shortness of breath, especially when lying down. This information is not intended to replace advice given to you by your health care provider. Make sure you discuss any questions you have with your health care provider. Document Revised: 12/28/2017 Document Reviewed: 12/28/2017 Elsevier Patient Education  2020 [employer].

## 2019-11-22 ENCOUNTER — Encounter: Payer: Self-pay | Admitting: Neurology

## 2019-11-22 ENCOUNTER — Other Ambulatory Visit: Payer: Self-pay

## 2019-11-22 ENCOUNTER — Ambulatory Visit (INDEPENDENT_AMBULATORY_CARE_PROVIDER_SITE_OTHER): Payer: PPO | Admitting: Neurology

## 2019-11-22 VITALS — BP 146/82 | HR 66 | Ht 65.0 in | Wt 112.4 lb

## 2019-11-22 DIAGNOSIS — F0391 Unspecified dementia with behavioral disturbance: Secondary | ICD-10-CM

## 2019-11-22 DIAGNOSIS — F03B18 Unspecified dementia, moderate, with other behavioral disturbance: Secondary | ICD-10-CM

## 2019-11-22 MED ORDER — DONEPEZIL HCL 10 MG PO TBDP: 10.0000 mg | ORAL_TABLET | Freq: Every day | ORAL | 11 refills | Status: DC

## 2019-11-22 NOTE — Progress Notes (Signed)
NEUROLOGY FOLLOW UP OFFICE NOTE  Amanda [name] [id number] 1945/06/29  HISTORY OF PRESENT ILLNESS: I had the pleasure of seeing Amanda [name] in follow-up in the neurology clinic on 11/22/2019.  The patient was last seen a year ago for moderate dementia with behavioral disturbance. She is accompanied by her husband who helps supplement the history today. She again has minimal insight into her condition. I spoke to her husband separately and he reports that she has been doing better, she sleeps all night, naps in the afternoon. She is having visual hallucinations, seeing things in the mirror. She needs help with dressing and bathing. Her husband reports they are setting up for in-home care next month. She is easily irritated, noted again during today's visit. Her husband reports she gets upset when her daughter cooks, getting upset someone else is in the kitchen. She is not taking any medications, her husband reports she could taste the Lexapro and Donepezil when he mixed with food, but he would like to restart the Donepezil. She states she is doing fine. She denies any headaches, dizziness, focal numbness/tingling/weakness, pain, no falls.   History on Initial Assessment 01/09/2018: This is a 74 year old right-handed woman with a history of hypertension, seen one time in June 2016 for memory loss, presenting for re-evaluation. On her initial visit in 2016, she was accompanied by her son and was noted to be upset with her son at that time because she felt there was nothing wrong. She reported being independent in all ADLs and able to do complex tasks. MMSE in June 2016 was 23/30, however since she was so upset, mood was considered as cause of memory issues. She presents today with minimal insight into her condition, she states her memory "I believe is good." She lives with her husband. Over the course of the visit, she becomes increasingly agitated and upset with her husband, and states she does not  know the woman with them today ("a friend of his"). I spoke to her daughter separately after, who sees the patient 3-4 times a year. Her daughter reports that family noticed more behavioral changes. She states she has never been to her daughter's house. When asked about bills, she states "he tries to be the manager." He took over bills after he was unable to find a bill in the mail. She then states "he thinks he can do a better job than me, he has not been in the house long." She does not take any medications. She denies getting lost driving, then reports her husband drives because she is not good with directions, husband would give her directions in unfamiliar places. She reports mood and sleep are good. Her husband states she is not constantly irritable, mostly in the evenings, however her daughter states it different. She is good with routine in her own home, but has difficulty in new situations. When her husband states she is a little more irritated in the morning, she states "I think you got that wrong." She denies anyheadaches, dizziness, diplopia, dysarthria, dysphagia, neck/back pain, focal numbness/tingling/weakness, bowel/bladder dysfunction. No anosmia, tremors, no falls. She denies any significant head injuries or alcohol use. Her mother had dementia in her 65s. 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   She had an MRI brain in 09/2014 for memory loss, no acute changes, there was mild diffuse atrophy and mild chronic microvascular disease. TSH and B12 in 2016 were normal.  PAST MEDICAL HISTORY: Past Medical History:  Diagnosis Date  .  Dementia (HCC)   . Dyslipidemia     MEDICATIONS: Current Outpatient Medications on File Prior to Visit  Medication Sig Dispense Refill  . escitalopram (LEXAPRO) 5 MG/5ML solution Take 10 mLs (10 mg total) by mouth daily. (Patient not taking: Reported on 10/18/2019) 300 mL 11  . hydrochlorothiazide (MICROZIDE) 12.5 MG capsule Take 1 capsule (12.5 mg total) by  mouth daily. (Patient not taking: Reported on 01/09/2018) 90 capsule 0  . ketoconazole (NIZORAL) 2 % cream Apply 1 application topically daily. (Patient not taking: Reported on 11/22/2019) 60 g 2   No current facility-administered medications on file prior to visit.    ALLERGIES: Allergies  Allergen Reactions  . Codeine     FAMILY HISTORY: Family History  Problem Relation Age of Onset  . Dementia Mother   . Heart disease Maternal Grandmother     SOCIAL HISTORY: Social History   Socioeconomic History  . Marital status: Married    Spouse name: Not on file  . Number of children: 2  . Years of education: Not on file  . Highest education level: Not on file  Occupational History  . Occupation: Retired  Tobacco Use  . Smoking status: Never Smoker  . Smokeless tobacco: Never Used  Vaping Use  . Vaping Use: Never used  Substance and Sexual Activity  . Alcohol use: No    Alcohol/week: 0.0 standard drinks  . Drug use: No  . Sexual activity: Yes    Partners: Male  Other Topics Concern  . Not on file  Social History Narrative   Pt lives in 2 story home with her husband and children   Has 2 adult children   12th grade education   Social Determinants of Health   Financial Resource Strain:   . Difficulty of Paying Living Expenses:   Food Insecurity:   . Worried About Programme researcher, broadcasting/film/video in the Last Year:   . Barista in the Last Year:   Transportation Needs:   . Freight forwarder (Medical):   Marland Kitchen Lack of Transportation (Non-Medical):   Physical Activity:   . Days of Exercise per Week:   . Minutes of Exercise per Session:   Stress:   . Feeling of Stress :   Social Connections:   . Frequency of Communication with Friends and Family:   . Frequency of Social Gatherings with Friends and Family:   . Attends Religious Services:   . Active Member of Clubs or Organizations:   . Attends Banker Meetings:   Marland Kitchen Marital Status:   Intimate Partner Violence:    . Fear of Current or Ex-Partner:   . Emotionally Abused:   Marland Kitchen Physically Abused:   . Sexually Abused:     PHYSICAL EXAM: Vitals:   11/22/19 1559  BP: (!) 146/82  Pulse: 66  SpO2: 100%   General: No acute distress Head:  Normocephalic/atraumatic Skin/Extremities: No rash, no edema Neurological Exam: alert and oriented to person, city/state. No aphasia or dysarthria. Fund of knowledge is reduced.  Recent and remote memory are impaired.  Attention and concentration are reduced, spelled WORLD but would not spell it backwards. Difficulty with naming, reading.  MMSE - Mini Mental State Exam 11/22/2019 01/09/2018 10/02/2014  Orientation to time 0 1 2  Orientation to Place 2 4 5   Registration 3 3 3   Attention/ Calculation 0 2 4  Recall 0 0 0  Language- name 2 objects 1 2 2   Language- repeat 0 1 1  Language-  follow 3 step command 0 1 3  Language- read & follow direction 0 0 1  Write a sentence 0 0 1  Copy design 0 0 1  Total score 6 14 23    Cranial nerves: Pupils equal, round, reactive to light. Extraocular movements intact with no nystagmus. Visual fields full.  No facial asymmetry. Motor: Bulk and tone normal, muscle strength 5/5 throughout with no pronator drift. Finger to nose testing intact.  Gait narrow-based and steady.  IMPRESSION: This is a 74 yo RH woman with a history of diet-controlled hypertension, with moderate dementia with behavioral disturbance, likely Alzheimer's disease. She is now having visual hallucinations. She has no insight into her condition and was starting to get agitated again during today's visit. I spoke to her husband separately, he would like to restart the Aricept 10mg  daily, he will crush it and mix with her food. We discussed restarting SSRI as well, he would like to hold off for now and see how she tolerates Aricept first. Continue 24/7 care. Encouraged to proceed with POA paperwork. Follow-up in 6-7 months, they know to call for any changes.    Thank you  for allowing me to participate in her care.  Please do not hesitate to call for any questions or concerns.   65, M.D.   CC: Dr. 

## 2019-11-22 NOTE — Patient Instructions (Signed)
Good to see you. Aricept has been sent to the pharmacy. Follow-up in 6 months, call for any changes.  FALL PRECAUTIONS: Be cautious when walking. Scan the area for obstacles that may increase the risk of trips and falls. When getting up in the mornings, sit up at the edge of the bed for a few minutes before getting out of bed. Consider elevating the bed at the head end to avoid drop of blood pressure when getting up. Walk always in a well-lit room (use night lights in the walls). Avoid area rugs or power cords from appliances in the middle of the walkways. Use a walker or a cane if necessary and consider physical therapy for balance exercise. Get your eyesight checked regularly.  FINANCIAL OVERSIGHT: Supervision, especially oversight when making financial decisions or transactions is also recommended.  HOME SAFETY: Consider the safety of the kitchen when operating appliances like stoves, microwave oven, and blender. Consider having supervision and share cooking responsibilities until no longer able to participate in those. Accidents with firearms and other hazards in the house should be identified and addressed as well.  DRIVING: Regarding driving, in patients with progressive memory problems, driving will be impaired. We advise to have someone else do the driving if trouble finding directions or if minor accidents are reported. Independent driving assessment is available to determine safety of driving.  ABILITY TO BE LEFT ALONE: If patient is unable to contact 911 operator, consider using LifeLine, or when the need is there, arrange for someone to stay with patients. Smoking is a fire hazard, consider supervision or cessation. Risk of wandering should be assessed by caregiver and if detected at any point, supervision and safe proof recommendations should be instituted.  MEDICATION SUPERVISION: Inability to self-administer medication needs to be constantly addressed. Implement a mechanism to ensure safe  administration of the medications.  RECOMMENDATIONS FOR ALL PATIENTS WITH MEMORY PROBLEMS: 1. Continue to exercise (Recommend 30 minutes of walking everyday, or 3 hours every week) 2. Increase social interactions - continue going to [place] and enjoy social gatherings with friends and family 3. Eat healthy, avoid fried foods and eat more fruits and vegetables 4. Maintain adequate blood pressure, blood sugar, and blood cholesterol level. Reducing the risk of stroke and cardiovascular disease also helps promoting better memory. 5. Avoid stressful situations. Live a simple life and avoid aggravations. Organize your time and prepare for the next day in anticipation. 6. Sleep well, avoid any interruptions of sleep and avoid any distractions in the bedroom that may interfere with adequate sleep quality 7. Avoid sugar, avoid sweets as there is a strong link between excessive sugar intake, diabetes, and cognitive impairment The Mediterranean diet has been shown to help patients reduce the risk of progressive memory disorders and reduces cardiovascular risk. This includes eating fish, eat fruits and green leafy vegetables, nuts like almonds and hazelnuts, walnuts, and also use olive oil. Avoid fast foods and fried foods as much as possible. Avoid sweets and sugar as sugar use has been linked to worsening of memory function.  There is always a concern of gradual progression of memory problems. If this is the case, then we may need to adjust level of care according to patient needs. Support, both to the patient and caregiver, should then be put into place.

## 2019-11-23 ENCOUNTER — Ambulatory Visit: Payer: PPO | Admitting: Neurology

## 2020-02-08 ENCOUNTER — Ambulatory Visit: Payer: PPO | Admitting: Podiatry

## 2020-02-08 ENCOUNTER — Other Ambulatory Visit: Payer: Self-pay

## 2020-02-08 ENCOUNTER — Encounter: Payer: Self-pay | Admitting: Podiatry

## 2020-02-08 DIAGNOSIS — B351 Tinea unguium: Secondary | ICD-10-CM | POA: Diagnosis not present

## 2020-02-08 DIAGNOSIS — M79674 Pain in right toe(s): Secondary | ICD-10-CM | POA: Diagnosis not present

## 2020-02-08 DIAGNOSIS — M79675 Pain in left toe(s): Secondary | ICD-10-CM | POA: Diagnosis not present

## 2020-02-10 NOTE — Progress Notes (Signed)
  Subjective:  Patient ID: Amanda [name], female    DOB: 1945-10-14,  MRN: [id number]  Amanda [name] presents to clinic today for painful thick toenails that are difficult to trim. Pain interferes with ambulation. Aggravating factors include wearing enclosed shoe gear. Pain is relieved with periodic professional debridement..  74 y.o. female presents with the above complaint.    Review of Systems: Negative except as noted in the HPI. Past Medical History:  Diagnosis Date  . Dementia (HCC)   . Dyslipidemia    History reviewed. No pertinent surgical history.  Current Outpatient Medications:  .  donepezil (ARICEPT ODT) 10 MG disintegrating tablet, Take 1 tablet (10 mg total) by mouth at bedtime., Disp: 30 tablet, Rfl: 11 .  escitalopram (LEXAPRO) 5 MG/5ML solution, Take 10 mLs (10 mg total) by mouth daily., Disp: 300 mL, Rfl: 11 .  hydrochlorothiazide (MICROZIDE) 12.5 MG capsule, Take 1 capsule (12.5 mg total) by mouth daily., Disp: 90 capsule, Rfl: 0 .  ketoconazole (NIZORAL) 2 % cream, Apply 1 application topically daily., Disp: 60 g, Rfl: 2 No Known Allergies Social History   Occupational History  . Occupation: Retired  Tobacco Use  . Smoking status: Never Smoker  . Smokeless tobacco: Never Used  Vaping Use  . Vaping Use: Never used  Substance and Sexual Activity  . Alcohol use: No    Alcohol/week: 0.0 standard drinks  . Drug use: No  . Sexual activity: Yes    Partners: Male    Objective:   Constitutional Amanda [name] is a pleasant 74 y.o. African American female, in NAD. AAO x 3.   Vascular Capillary refill time to digits immediate b/l. Palpable pedal pulses b/l LE. Pedal hair absent. Lower extremity skin temperature gradient within normal limits. No pain with calf compression b/l. +2 pitting edema b/l lower extremities. No cyanosis or clubbing noted.  Neurologic Normal speech. Oriented to person, place, and time. Protective sensation intact 5/5 intact  bilaterally with 10g monofilament b/l.  Dermatologic Pedal skin with normal turgor, texture and tone bilaterally. No open wounds bilaterally. No interdigital macerations bilaterally. Toenails 1-5 b/l elongated, discolored, dystrophic, thickened, crumbly with subungual debris and tenderness to dorsal palpation.  Orthopedic: Normal muscle strength 5/5 to all lower extremity muscle groups bilaterally. No pain crepitus or joint limitation noted with ROM b/l.   Radiographs: None Assessment:   1. Pain due to onychomycosis of toenails of both feet    Plan:  Patient was evaluated and treated and all questions answered.  Onychomycosis with pain -Nails palliatively debridement as below -Educated on self-care  Procedure: Nail Debridement Rationale: Pain Type of Debridement: manual, sharp debridement. Instrumentation: Nail nipper, rotary burr. Number of Nails: 10 -Examined patient. -Toenails 1-5 b/l were debrided in length and girth with sterile nail nippers and dremel without iatrogenic bleeding.  -Patient to report any pedal injuries to medical professional immediately. -Patient to continue soft, supportive shoe gear daily. -Patient/POA to call should there be question/concern in the interim.  Return in about 3 months (around 05/10/2020).  Freddie Breech, DPM

## 2020-02-25 ENCOUNTER — Telehealth: Payer: Self-pay

## 2020-02-25 NOTE — Telephone Encounter (Signed)
LVM for pt to call back to the office to schedule an AWV/CPE visit. Amanda [name]

## 2020-03-06 ENCOUNTER — Ambulatory Visit: Payer: PPO | Attending: Internal Medicine

## 2020-03-06 DIAGNOSIS — Z23 Encounter for immunization: Secondary | ICD-10-CM

## 2020-03-06 NOTE — Progress Notes (Signed)
   Covid-19 Vaccination Clinic  Name:  Amanda [name]    MRN: [id number] DOB: 06/05/1945  03/06/2020  Amanda [name] was observed post Covid-19 immunization for 15 minutes without incident. She was provided with Vaccine Information Sheet and instruction to access the V-Safe system.   Amanda [name] was instructed to call 911 with any severe reactions post vaccine: Marland Kitchen Difficulty breathing  . Swelling of face and throat  . A fast heartbeat  . A bad rash all over body  . Dizziness and weakness   Immunizations Administered    Name Date Dose VIS Date Route   Pfizer COVID-19 Vaccine 03/06/2020  2:58 PM 0.3 mL 02/06/2020 Intramuscular   Manufacturer: [employer], [employer]   Lot: [id number]   NDC: [address]-6

## 2020-05-14 ENCOUNTER — Ambulatory Visit: Payer: PPO | Admitting: Podiatry

## 2020-05-16 ENCOUNTER — Ambulatory Visit: Payer: PPO | Admitting: Podiatry

## 2020-05-20 ENCOUNTER — Encounter: Payer: Self-pay | Admitting: Podiatry

## 2020-05-20 ENCOUNTER — Other Ambulatory Visit: Payer: Self-pay

## 2020-05-20 ENCOUNTER — Ambulatory Visit: Payer: PPO | Admitting: Podiatry

## 2020-05-20 DIAGNOSIS — M79674 Pain in right toe(s): Secondary | ICD-10-CM | POA: Diagnosis not present

## 2020-05-20 DIAGNOSIS — M79675 Pain in left toe(s): Secondary | ICD-10-CM | POA: Diagnosis not present

## 2020-05-20 DIAGNOSIS — B351 Tinea unguium: Secondary | ICD-10-CM

## 2020-05-20 NOTE — Progress Notes (Signed)
  Subjective:  Patient ID: Amanda [name], female    DOB: 09-09-1945,  MRN: [id number]  Amanda [name] presents to clinic today for painful thick toenails that are difficult to trim. Pain interferes with ambulation. Aggravating factors include wearing enclosed shoe gear. Pain is relieved with periodic professional debridement..  75 y.o. female presents with the above complaint.  Her husband is present during today's visit. She has h/o dementia. They voice no new pedal problems on today's visit.  Review of Systems: Negative except as noted in the HPI. Past Medical History:  Diagnosis Date  . Dementia (HCC)   . Dyslipidemia    History reviewed. No pertinent surgical history.  Current Outpatient Medications:  .  donepezil (ARICEPT ODT) 10 MG disintegrating tablet, Take 1 tablet (10 mg total) by mouth at bedtime., Disp: 30 tablet, Rfl: 11 .  escitalopram (LEXAPRO) 5 MG/5ML solution, Take 10 mLs (10 mg total) by mouth daily., Disp: 300 mL, Rfl: 11 .  hydrochlorothiazide (MICROZIDE) 12.5 MG capsule, Take 1 capsule (12.5 mg total) by mouth daily., Disp: 90 capsule, Rfl: 0 .  ketoconazole (NIZORAL) 2 % cream, Apply 1 application topically daily., Disp: 60 g, Rfl: 2 No Known Allergies Social History   Occupational History  . Occupation: Retired  Tobacco Use  . Smoking status: Never Smoker  . Smokeless tobacco: Never Used  Vaping Use  . Vaping Use: Never used  Substance and Sexual Activity  . Alcohol use: No    Alcohol/week: 0.0 standard drinks  . Drug use: No  . Sexual activity: Yes    Partners: Male    Objective:   Constitutional SANDRALEE TARKINGTON is a pleasant 75 y.o. African American female, WD, WN in NAD. AAO x 3.   Vascular Capillary refill time to digits immediate b/l. Palpable pedal pulses b/l LE. Pedal hair absent. Lower extremity skin temperature gradient within normal limits. No pain with calf compression b/l. Trace edema noted b/l lower extremities. No cyanosis  or clubbing noted.  Neurologic Normal speech. Oriented to person, place, and time. Protective sensation intact 5/5 intact bilaterally with 10g monofilament b/l. Vibratory sensation intact b/l.  Dermatologic Pedal skin with normal turgor, texture and tone bilaterally. No open wounds bilaterally. No interdigital macerations bilaterally. Toenails 1-5 b/l elongated, discolored, dystrophic, thickened, crumbly with subungual debris and tenderness to dorsal palpation.  Orthopedic: Normal muscle strength 5/5 to all lower extremity muscle groups bilaterally. No pain crepitus or joint limitation noted with ROM b/l. No gross bony deformities bilaterally. Patient ambulates independent of any assistive aids.   Radiographs: None Assessment:   1. Pain due to onychomycosis of toenails of both feet    Plan:  Patient was evaluated and treated and all questions answered.  Onychomycosis with pain -Nails palliatively debridement as below -Educated on self-care  Procedure: Nail Debridement Rationale: Pain Type of Debridement: manual, sharp debridement. Instrumentation: Nail nipper, rotary burr. Number of Nails: 10 -Examined patient. -No new findings. No new orders. -Patient to continue soft, supportive shoe gear daily. -Toenails 1-5 b/l were debrided in length and girth with sterile nail nippers and dremel without iatrogenic bleeding.  -Patient to report any pedal injuries to medical professional immediately. -Patient/POA to call should there be question/concern in the interim.  Return in about 3 months (around 08/17/2020).  Freddie Breech, DPM

## 2020-06-27 ENCOUNTER — Ambulatory Visit: Payer: PPO | Admitting: Neurology

## 2020-09-01 ENCOUNTER — Ambulatory Visit: Payer: PPO | Admitting: Podiatry

## 2020-09-01 ENCOUNTER — Other Ambulatory Visit: Payer: Self-pay

## 2020-09-01 ENCOUNTER — Encounter: Payer: Self-pay | Admitting: Podiatry

## 2020-09-01 DIAGNOSIS — M79675 Pain in left toe(s): Secondary | ICD-10-CM

## 2020-09-01 DIAGNOSIS — M79674 Pain in right toe(s): Secondary | ICD-10-CM

## 2020-09-01 DIAGNOSIS — B351 Tinea unguium: Secondary | ICD-10-CM | POA: Diagnosis not present

## 2020-09-06 NOTE — Progress Notes (Signed)
Subjective: Amanda [name] is a pleasant 75 y.o. female patient seen today painful thick toenails that are difficult to trim. Pain interferes with ambulation. Aggravating factors include wearing enclosed shoe gear. Pain is relieved with periodic professional debridement.  Patient has h/o dementia. Her husband is present during today's visit. No Known Allergies  Objective: Physical Exam  General: HELEN WINTERHALTER is a pleasant 75 y.o. African American female, in NAD. AAO x 2.   Vascular:  Capillary refill time to digits immediate b/l. Palpable pedal pulses b/l LE. Pedal hair present b/l. Lower extremity skin temperature gradient within normal limits. No pain with calf compression b/l.  Dermatological:  Pedal skin with normal turgor, texture and tone bilaterally. No open wounds bilaterally. No interdigital macerations bilaterally. Toenails 1-5 b/l elongated, discolored, dystrophic, thickened, crumbly with subungual debris and tenderness to dorsal palpation. No hyperkeratotic nor porokeratotic lesions present on today's visit.  Musculoskeletal:  Normal muscle strength 5/5 to all lower extremity muscle groups bilaterally. No pain crepitus or joint limitation noted with ROM b/l. No gross bony deformities bilaterally.  Neurological:  Protective sensation intact 5/5 intact bilaterally with 10g monofilament b/l. Vibratory sensation intact b/l. Clonus negative b/l.  Assessment and Plan:  1. Pain due to onychomycosis of toenails of both feet     -Examined patient. -Patient to continue soft, supportive shoe gear daily. -Toenails 1-5 b/l were debrided in length and girth with sterile nail nippers and dremel without iatrogenic bleeding.  -Patient to report any pedal injuries to medical professional immediately. -Patient/POA to call should there be question/concern in the interim.  Return in about 3 months (around 12/02/2020).  Freddie Breech, DPM

## 2020-10-30 ENCOUNTER — Ambulatory Visit: Payer: PPO | Attending: Internal Medicine

## 2020-10-30 ENCOUNTER — Other Ambulatory Visit: Payer: Self-pay

## 2020-10-30 ENCOUNTER — Other Ambulatory Visit (HOSPITAL_BASED_OUTPATIENT_CLINIC_OR_DEPARTMENT_OTHER): Payer: Self-pay

## 2020-10-30 DIAGNOSIS — Z23 Encounter for immunization: Secondary | ICD-10-CM

## 2020-10-30 MED ORDER — COVID-19 MRNA VAC-TRIS(PFIZER) 30 MCG/0.3ML IM SUSP
INTRAMUSCULAR | 0 refills | Status: DC
  Filled 2020-10-30: qty 0.3, 1d supply, fill #0

## 2020-10-30 NOTE — Progress Notes (Signed)
   Covid-19 Vaccination Clinic  Name:  CALA KRUCKENBERG    MRN: [id number] DOB: 1946-01-14  10/30/2020  Ms. Stang was observed post Covid-19 immunization for 15 minutes without incident. She was provided with Vaccine Information Sheet and instruction to access the V-Safe system.   Ms. Dunkley was instructed to call 911 with any severe reactions post vaccine: Difficulty breathing  Swelling of face and throat  A fast heartbeat  A bad rash all over body  Dizziness and weakness   Immunizations Administered     Name Date Dose VIS Date Route   PFIZER Comrnaty(Gray TOP) Covid-19 Vaccine 10/30/2020  4:06 PM 0.3 mL 03/27/2020 Intramuscular   Manufacturer: [employer], [employer]   Lot: [id number]   NDC: [phone]

## 2020-12-01 ENCOUNTER — Encounter: Payer: Self-pay | Admitting: Podiatry

## 2020-12-01 ENCOUNTER — Ambulatory Visit: Payer: PPO | Admitting: Podiatry

## 2020-12-01 ENCOUNTER — Other Ambulatory Visit: Payer: Self-pay

## 2020-12-01 DIAGNOSIS — M79674 Pain in right toe(s): Secondary | ICD-10-CM

## 2020-12-01 DIAGNOSIS — B351 Tinea unguium: Secondary | ICD-10-CM | POA: Diagnosis not present

## 2020-12-01 DIAGNOSIS — M79675 Pain in left toe(s): Secondary | ICD-10-CM

## 2020-12-06 NOTE — Progress Notes (Signed)
Subjective: Amanda [name] is a pleasant 75 y.o. female patient seen today painful thick toenails that are difficult to trim. Pain interferes with ambulation. Aggravating factors include wearing enclosed shoe gear. Pain is relieved with periodic professional debridement.  She has h/o dementia. She is accompanied by her husband on today's visit. Husband voices no new pedal problems on today's visit.  No Known Allergies  Objective: Physical Exam  General: Amanda [name] is a pleasant 75 y.o. African American female, WD, WN in NAD. AAO x 3.   Vascular:  Capillary refill time to digits immediate b/l. Palpable DP pulse(s) b/l lower extremities Palpable PT pulse(s) b/l lower extremities Pedal hair present. Lower extremity skin temperature gradient within normal limits. No pain with calf compression b/l. No edema noted b/l lower extremities.  Dermatological:  Skin warm and supple b/l lower extremities. No open wounds b/l lower extremities. No interdigital macerations b/l lower extremities. Toenails 1-5 b/l elongated, discolored, dystrophic, thickened, crumbly with subungual debris and tenderness to dorsal palpation.  Musculoskeletal:  Normal muscle strength 5/5 to all lower extremity muscle groups bilaterally. No pain crepitus or joint limitation noted with ROM b/l lower extremities. No gross bony deformities b/l lower extremities.  Neurological:  Protective sensation intact 5/5 intact bilaterally with 10g monofilament b/l. Vibratory sensation intact b/l.  Assessment and Plan:  1. Pain due to onychomycosis of toenails of both feet   -Examined patient. -No new findings. No new orders. -Patient to continue soft, supportive shoe gear daily. -Toenails 1-5 b/l were debrided in length and girth with sterile nail nippers and dremel without iatrogenic bleeding.  -Patient to report any pedal injuries to medical professional immediately. -Patient/POA to call should there be question/concern  in the interim.  Return in about 3 months (around 03/03/2021).  Freddie Breech, DPM

## 2021-03-06 ENCOUNTER — Telehealth (INDEPENDENT_AMBULATORY_CARE_PROVIDER_SITE_OTHER): Payer: PPO | Admitting: Neurology

## 2021-03-06 ENCOUNTER — Other Ambulatory Visit: Payer: Self-pay

## 2021-03-06 ENCOUNTER — Encounter: Payer: Self-pay | Admitting: Neurology

## 2021-03-06 VITALS — Ht 64.0 in | Wt 120.0 lb

## 2021-03-06 DIAGNOSIS — F03B18 Unspecified dementia, moderate, with other behavioral disturbance: Secondary | ICD-10-CM | POA: Diagnosis not present

## 2021-03-06 NOTE — Progress Notes (Signed)
Virtual Visit via Video Note The purpose of this virtual visit is to provide medical care while limiting exposure to the novel coronavirus.    Consent was obtained for video visit:  Yes.   Answered questions that patient had about telehealth interaction:  Yes.   I discussed the limitations, risks, security and privacy concerns of performing an evaluation and management service by telemedicine. I also discussed with the patient that there may be a patient responsible charge related to this service. The patient expressed understanding and agreed to proceed.  Pt location: Home Physician Location: office Name of referring provider:  [name], MD I connected with Amanda [name] at patients initiation/request on 03/06/2021 at  3:00 PM EST by video enabled telemedicine application and verified that I am speaking with the correct person using two identifiers. Pt MRN:  [id number] Pt DOB:  07-Feb-1946 Video Participants:  Amanda [name];  Precious Bard (spouse)   History of Present Illness:  The patient had a virtual video visit on 03/06/2021. She was last seen in the neurology clinic over a year ago for moderate dementia with behavioral disturbance. She has minimal insight into her condition and was having quite a lot of agitation on prior visits. She was refusing to take medications, previously prescribed Donepezil and Lexapro. She is not taking any medications. She reports she is doing fine. Her husband notes that behaviors are better now compared to 1-2 years ago, agitation is very rare. She has poor attention during the visit, looking around the house. Her husband denies any wandering behaviors, sleep is good. She is on a good diet plan, her notes balanced meals have helped her a lot. No difficulty swallowing. She denies any headaches, dizziness, vision changes, focal numbness/tingling/weakness, no falls. She wears Depends but is able to use the bathroom when she needs to.     History on Initial Assessment 01/09/2018: This is a 75 year old right-handed woman with a history of hypertension, seen one time in June 2016 for memory loss, presenting for re-evaluation. On her initial visit in 2016, she was accompanied by her son and was noted to be upset with her son at that time because she felt there was nothing wrong. She reported being independent in all ADLs and able to do complex tasks. MMSE in June 2016 was 23/30, however since she was so upset, mood was considered as cause of memory issues. She presents today with minimal insight into her condition, she states her memory "I believe is good." She lives with her husband. Over the course of the visit, she becomes increasingly agitated and upset with her husband, and states she does not know the woman with them today ("a friend of his"). I spoke to her daughter separately after, who sees the patient 3-4 times a year. Her daughter reports that family noticed more behavioral changes. She states she has never been to her daughter's house. When asked about bills, she states "he tries to be the manager." He took over bills after he was unable to find a bill in the mail. She then states "he thinks he can do a better job than me, he has not been in the house long." She does not take any medications. She denies getting lost driving, then reports her husband drives because she is not good with directions, husband would give her directions in unfamiliar places. She reports mood and sleep are good. Her husband states she is not constantly irritable, mostly in the evenings, however  her daughter states it different. She is good with routine in her own home, but has difficulty in new situations. When her husband states she is a little more irritated in the morning, she states "I think you got that wrong." She denies anyheadaches, dizziness, diplopia, dysarthria, dysphagia, neck/back pain, focal numbness/tingling/weakness, bowel/bladder dysfunction.  No anosmia, tremors, no falls. She denies any significant head injuries or alcohol use. Her mother had dementia in her 40s. 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    She had an MRI brain in 09/2014 for memory loss, no acute changes, there was mild diffuse atrophy and mild chronic microvascular disease. TSH and B12 in 2016 were normal.    Current Outpatient Medications on File Prior to Visit  Medication Sig Dispense Refill   donepezil (ARICEPT ODT) 10 MG disintegrating tablet Take 1 tablet (10 mg total) by mouth at bedtime. (Patient not taking: Reported on 03/06/2021) 30 tablet 11   escitalopram (LEXAPRO) 5 MG/5ML solution Take 10 mLs (10 mg total) by mouth daily. (Patient not taking: Reported on 03/06/2021) 300 mL 11   hydrochlorothiazide (MICROZIDE) 12.5 MG capsule Take 1 capsule (12.5 mg total) by mouth daily. (Patient not taking: Reported on 03/06/2021) 90 capsule 0   ketoconazole (NIZORAL) 2 % cream Apply 1 application topically daily. (Patient not taking: Reported on 03/06/2021) 60 g 2   No current facility-administered medications on file prior to visit.     Observations/Objective:   Vitals:   03/06/21 1433  Weight: 120 lb (54.4 kg)  Height: 5\' 4"  (1.626 m)   GEN:  The patient appears stated age and is in NAD.  Neurological examination: Patient is awake, alert, oriented x 2, able to give her address. No aphasia or dysarthria. Intact fluency, reduced comprehension. Remote and recent memory impaired. Attention and concentration are reduced. Cranial nerves: Extraocular movements intact with no nystagmus. No facial asymmetry. Motor: moves all extremities symmetrically, at least anti-gravity x 4. No incoordination on finger to nose testing.   Assessment and Plan:   This is a 75 yo RH woman with a history of diet-controlled hypertension, with moderate dementia with behavioral disturbance, likely Alzheimer's disease. She has minimal insight into her condition and is not taking any  medications. Her husband notes behavioral changes have improved without need for medication. We again discussed the importance of control of vascular risk factors, physical exercise, and brain stimulation exercises for overall brain health. Follow-up as needed, they know to call for any changes.   Follow Up Instructions:   -I discussed the assessment and treatment plan with the patient. The patient was provided an opportunity to ask questions and all were answered. The patient agreed with the plan and demonstrated an understanding of the instructions.   The patient was advised to call back or seek an in-person evaluation if the symptoms worsen or if the condition fails to improve as anticipated.     61, MD

## 2021-03-10 ENCOUNTER — Ambulatory Visit: Payer: PPO | Admitting: Podiatry

## 2021-03-10 ENCOUNTER — Other Ambulatory Visit: Payer: Self-pay

## 2021-03-10 ENCOUNTER — Encounter: Payer: Self-pay | Admitting: Podiatry

## 2021-03-10 DIAGNOSIS — M79674 Pain in right toe(s): Secondary | ICD-10-CM | POA: Diagnosis not present

## 2021-03-10 DIAGNOSIS — B351 Tinea unguium: Secondary | ICD-10-CM

## 2021-03-10 DIAGNOSIS — M79675 Pain in left toe(s): Secondary | ICD-10-CM

## 2021-03-14 NOTE — Progress Notes (Signed)
  Subjective:  Patient ID: Amanda [name], female    DOB: February 28, 1946,  MRN: [id number]  Amanda [name] presents to clinic today for painful elongated mycotic toenails 1-5 bilaterally which are tender when wearing enclosed shoe gear. Pain is relieved with periodic professional debridement.  Patient has h/o dementia. She is accompanied by her husband on today's visit. Husband voices no new pedal concerns on today's visit.  PCP is [name], MD.  No Known Allergies  Review of Systems: Negative except as noted in the HPI. Objective:   Constitutional Amanda [name] is a pleasant 75 y.o. African American female, WD, WN in NAD. AAO x 3.   Vascular CFT immediate b/l LE. Palpable DP/PT pulses b/l LE. Digital hair present b/l. Skin temperature gradient WNL b/l. No pain with calf compression b/l. No edema noted b/l. No cyanosis or clubbing noted b/l LE.  Neurologic Normal speech. Oriented to person, place, and time. Protective sensation intact 5/5 intact bilaterally with 10g monofilament b/l. Vibratory sensation intact b/l.  Dermatologic Pedal integument with normal turgor, texture and tone b/l LE. No open wounds b/l. No interdigital macerations b/l. Toenails 1-5 b/l elongated, thickened, discolored with subungual debris. +Tenderness with dorsal palpation of nailplates. No hyperkeratotic or porokeratotic lesions present. Incurvated nailplate medial and lateral border(s) R hallux.  Nail border hypertrophy minimal. There is tenderness to palpation. Sign(s) of infection: no clinical signs of infection noted on examination today.  Orthopedic: Normal muscle strength 5/5 to all lower extremity muscle groups bilaterally. No pain, crepitus or joint limitation noted with ROM b/l LE. No gross bony pedal deformities b/l. Patient ambulates independently without assistive aids.   Radiographs: None  Assessment:   1. Pain due to onychomycosis of toenails of both feet    Plan:  Patient was  evaluated and treated and all questions answered. Consent given for treatment as described below: -Toenails 1-5 b/l were debrided in length and girth with sterile nail nippers and dremel without iatrogenic bleeding.  -Offending nail border debrided and curretaged R hallux utilizing sterile nail nipper and currette. Border(s) cleansed with alcohol and triple antibiotic ointment applied. Husband instructed to apply Neosporin Cream  to R hallux once daily for 7 days. -Patient/POA to call should there be question/concern in the interim.  Return in about 3 months (around 06/10/2021).  Freddie Breech, DPM

## 2021-04-24 ENCOUNTER — Ambulatory Visit: Payer: PPO | Attending: Internal Medicine

## 2021-04-24 DIAGNOSIS — Z23 Encounter for immunization: Secondary | ICD-10-CM

## 2021-04-27 ENCOUNTER — Other Ambulatory Visit (HOSPITAL_BASED_OUTPATIENT_CLINIC_OR_DEPARTMENT_OTHER): Payer: Self-pay

## 2021-04-27 MED ORDER — PFIZER COVID-19 VAC BIVALENT 30 MCG/0.3ML IM SUSP
INTRAMUSCULAR | 0 refills | Status: DC
  Filled 2021-04-27: qty 0.3, 1d supply, fill #0

## 2021-04-27 NOTE — Progress Notes (Signed)
° °  Covid-19 Vaccination Clinic  Name:  Amanda [name]    MRN: [id number] DOB: 07/09/1945  04/27/2021  [name] was observed post Covid-19 immunization for 15 minutes without incident. She was provided with Vaccine Information Sheet and instruction to access the V-Safe system.   [name] was instructed to call 911 with any severe reactions post vaccine: Difficulty breathing  Swelling of face and throat  A fast heartbeat  A bad rash all over body  Dizziness and weakness   Immunizations Administered     Name Date Dose VIS Date Route   Pfizer Covid-19 Vaccine Bivalent Booster 04/24/2021  3:00 PM 0.3 mL 12/17/2020 Intramuscular   Manufacturer: [employer], [employer]   Lot: [id number]   NDC: [phone]

## 2021-05-22 ENCOUNTER — Other Ambulatory Visit: Payer: Self-pay

## 2021-05-22 ENCOUNTER — Encounter (HOSPITAL_BASED_OUTPATIENT_CLINIC_OR_DEPARTMENT_OTHER): Payer: Self-pay

## 2021-05-22 ENCOUNTER — Emergency Department (HOSPITAL_BASED_OUTPATIENT_CLINIC_OR_DEPARTMENT_OTHER)
Admission: EM | Admit: 2021-05-22 | Discharge: 2021-05-22 | Disposition: A | Discharge status: Home / Self Care | Payer: PPO | Attending: Emergency Medicine | Admitting: Emergency Medicine

## 2021-05-22 ENCOUNTER — Emergency Department (HOSPITAL_BASED_OUTPATIENT_CLINIC_OR_DEPARTMENT_OTHER): Payer: PPO

## 2021-05-22 DIAGNOSIS — W19XXXA Unspecified fall, initial encounter: Secondary | ICD-10-CM | POA: Insufficient documentation

## 2021-05-22 DIAGNOSIS — M79662 Pain in left lower leg: Secondary | ICD-10-CM | POA: Diagnosis not present

## 2021-05-22 DIAGNOSIS — F0390 Unspecified dementia without behavioral disturbance: Secondary | ICD-10-CM | POA: Diagnosis not present

## 2021-05-22 DIAGNOSIS — Z043 Encounter for examination and observation following other accident: Secondary | ICD-10-CM | POA: Diagnosis not present

## 2021-05-22 DIAGNOSIS — Z79899 Other long term (current) drug therapy: Secondary | ICD-10-CM | POA: Diagnosis not present

## 2021-05-22 DIAGNOSIS — R531 Weakness: Secondary | ICD-10-CM | POA: Insufficient documentation

## 2021-05-22 DIAGNOSIS — Z20822 Contact with and (suspected) exposure to covid-19: Secondary | ICD-10-CM | POA: Insufficient documentation

## 2021-05-22 DIAGNOSIS — R9431 Abnormal electrocardiogram [ECG] [EKG]: Secondary | ICD-10-CM | POA: Diagnosis not present

## 2021-05-22 DIAGNOSIS — M79661 Pain in right lower leg: Secondary | ICD-10-CM | POA: Diagnosis not present

## 2021-05-22 DIAGNOSIS — M311 Thrombotic microangiopathy, unspecified: Secondary | ICD-10-CM | POA: Diagnosis not present

## 2021-05-22 LAB — CBC WITH DIFFERENTIAL/PLATELET
Abs Immature Granulocytes: 0 10*3/uL (ref 0.00–0.07)
Basophils Absolute: 0 10*3/uL (ref 0.0–0.1)
Basophils Relative: 0 %
Eosinophils Absolute: 0 10*3/uL (ref 0.0–0.5)
Eosinophils Relative: 0 %
HCT: 37.8 % (ref 36.0–46.0)
Hemoglobin: 12.2 g/dL (ref 12.0–15.0)
Immature Granulocytes: 0 %
Lymphocytes Relative: 19 %
Lymphs Abs: 1 10*3/uL (ref 0.7–4.0)
MCH: 28 pg (ref 26.0–34.0)
MCHC: 32.3 g/dL (ref 30.0–36.0)
MCV: 86.9 fL (ref 80.0–100.0)
Monocytes Absolute: 0.6 10*3/uL (ref 0.1–1.0)
Monocytes Relative: 11 %
Neutro Abs: 3.7 10*3/uL (ref 1.7–7.7)
Neutrophils Relative %: 70 %
Platelets: 252 10*3/uL (ref 150–400)
RBC: 4.35 MIL/uL (ref 3.87–5.11)
RDW: 13 % (ref 11.5–15.5)
WBC: 5.3 10*3/uL (ref 4.0–10.5)
nRBC: 0 % (ref 0.0–0.2)

## 2021-05-22 LAB — BASIC METABOLIC PANEL
Anion gap: 10 (ref 5–15)
BUN: 13 mg/dL (ref 8–23)
CO2: 25 mmol/L (ref 22–32)
Calcium: 9.8 mg/dL (ref 8.9–10.3)
Chloride: 101 mmol/L (ref 98–111)
Creatinine, Ser: 0.66 mg/dL (ref 0.44–1.00)
GFR, Estimated: >60 mL/min (ref >60)
Glucose, Bld: 93 mg/dL (ref 70–99)
Potassium: 4.2 mmol/L (ref 3.5–5.1)
Sodium: 136 mmol/L (ref 135–145)

## 2021-05-22 LAB — RESP PANEL BY RT-PCR (FLU A&B, COVID) ARPGX2
Influenza A by PCR: NEGATIVE
Influenza B by PCR: NEGATIVE
SARS Coronavirus 2 by RT PCR: NEGATIVE

## 2021-05-22 LAB — TROPONIN I (HIGH SENSITIVITY): Troponin I (High Sensitivity): 2 ng/L (ref <18)

## 2021-05-22 MED ORDER — MIDAZOLAM HCL 2 MG/2ML IJ SOLN
2.0000 mg | Freq: Once | INTRAMUSCULAR | Status: AC
  Administered 2021-05-22: 2 mg via INTRAVENOUS
  Filled 2021-05-22: qty 2

## 2021-05-22 NOTE — ED Notes (Signed)
Patient transported to CT 

## 2021-05-22 NOTE — ED Notes (Signed)
CT call patient unable to cooperate due to confusion.  Various  distraction methods including family with patient attempted.  Went with patient to CT for versed admission and on monitor.  Pulse Ox remained between 97 and 100%.  Patient tolerated well.

## 2021-05-22 NOTE — ED Provider Notes (Signed)
[facility] EMERGENCY DEPT Provider Note   CSN: [id number] Arrival date & time: 05/22/21  1059     History  Chief Complaint  Patient presents with   Amanda [name] is a 77 y.o. female with history significant for dementia who presents to the ED accompanied by her family for evaluation of an unwitnessed fall about 4 hours prior to arrival.  Patient's daughter states that patient was wandering around the house when she heard a collateral from the bathroom.  She states patient fell into the tub, although was caught by the shower curtain and appeared dazed.  Daughter assisted her out of the shower, and states that her legs seem to be weak and giving out on her.  She was able to get her into the hallway where she can rest against the wall.  After small mount of time, she was able to escort patient to a bed without any issues.  Patient is not currently on any medications, including blood thinners.  Patient herself states that she is in no pain and has no complaints.  She was ambulatory on arrival.   Fall      Home Medications Prior to Admission medications   Medication Sig Start Date End Date Taking? Authorizing Provider  COVID-19 mRNA bivalent vaccine, Pfizer, (PFIZER COVID-19 VAC BIVALENT) injection Inject into the muscle. 04/27/21   Judyann Munson, MD  donepezil (ARICEPT ODT) 10 MG disintegrating tablet Take 1 tablet (10 mg total) by mouth at bedtime. Patient not taking: Reported on 03/06/2021 11/22/19   Van Clines, MD  escitalopram [employer]) 5 MG/5ML solution Take 10 mLs (10 mg total) by mouth daily. Patient not taking: Reported on 03/06/2021 11/15/18   Van Clines, MD  hydrochlorothiazide (MICROZIDE) 12.5 MG capsule Take 1 capsule (12.5 mg total) by mouth daily. Patient not taking: Reported on 03/06/2021 06/17/17   [name], MD  ketoconazole (NIZORAL) 2 % cream Apply 1 application topically daily. Patient not taking: Reported on 03/06/2021 11/01/19    Edwin Cap, DPM      Allergies    Patient has no known allergies.    Review of Systems   Review of Systems  Unable to perform ROS: Dementia   Physical Exam Updated Vital Signs BP 120/68    Pulse 67    Temp (!) 97.3 F (36.3 C) (Temporal)    Resp 15    Ht 5\' 4"  (1.626 m)    Wt 54.4 kg    SpO2 99%    BMI 20.59 kg/m  Physical Exam Vitals and nursing note reviewed.  Constitutional:      General: She is not in acute distress.    Appearance: She is not ill-appearing.  HENT:     Head: Atraumatic.  Eyes:     Conjunctiva/sclera: Conjunctivae normal.  Cardiovascular:     Rate and Rhythm: Normal rate and regular rhythm.     Pulses: Normal pulses.     Heart sounds: No murmur heard. Pulmonary:     Effort: Pulmonary effort is normal. No respiratory distress.     Breath sounds: Normal breath sounds.  Abdominal:     General: Abdomen is flat. There is no distension.     Palpations: Abdomen is soft.     Tenderness: There is no abdominal tenderness.  Musculoskeletal:        General: Normal range of motion.     Cervical back: Normal range of motion.     Comments: T-spine and L-spine  nontender to palpation at midline. Patient moves all extremities without difficulty. All joints supple and easily movable, no erythema, swelling or palpable deformity, all compartments soft.   Skin:    General: Skin is warm and dry.     Capillary Refill: Capillary refill takes less than 2 seconds.  Neurological:     General: No focal deficit present.     Mental Status: She is alert.  Psychiatric:        Mood and Affect: Mood normal.    ED Results / Procedures / Treatments   Labs (all labs ordered are listed, but only abnormal results are displayed) Labs Reviewed  RESP PANEL BY RT-PCR (FLU A&B, COVID) [id number]  BASIC METABOLIC PANEL  CBC WITH DIFFERENTIAL/PLATELET  TROPONIN I (HIGH SENSITIVITY)    EKG EKG Interpretation  Date/Time:  Friday May 22 2021 14:02:55 EST Ventricular Rate:   80 PR Interval:  150 QRS Duration: 82 QT Interval:  392 QTC Calculation: 453 [name] Axis:   78 Text Interpretation: Sinus rhythm Confirmed by Alvester Chou [phone]) on 05/22/2021 2:40:37 PM  Radiology CT Head Wo Contrast  Result Date: 05/22/2021 CLINICAL DATA:  Fall EXAM: CT HEAD WITHOUT CONTRAST TECHNIQUE: Contiguous axial images were obtained from the base of the skull through the vertex without intravenous contrast. RADIATION DOSE REDUCTION: This exam was performed according to the departmental dose-optimization program which includes automated exposure control, adjustment of the mA and/or kV according to patient size and/or use of iterative reconstruction technique. COMPARISON:  CT head 03/26/2017, MR head 10/16/2014 FINDINGS: Image quality is mildly motion degraded. Brain: There is no evidence of acute intracranial hemorrhage, extra-axial fluid collection, or acute infarct. There is mild global parenchymal volume loss with prominence of the ventricular system and extra-axial CSF spaces. Patchy hypodensity in the subcortical and periventricular white matter likely reflects sequela of chronic white matter microangiopathy. There is no mass lesion.  There is no midline shift. Vascular: No hyperdense vessel or unexpected calcification. Skull: Normal. Negative for fracture or focal lesion. Sinuses/Orbits: Imaged paranasal sinuses are clear. Globes and orbits are unremarkable. Other: None. IMPRESSION: No acute intracranial pathology. Electronically Signed   By: Lesia Hausen M.D.   On: 05/22/2021 15:14    Procedures Procedures    Medications Ordered in ED Medications  midazolam (VERSED) injection 2 mg (2 mg Intravenous Given 05/22/21 1451)    ED Course/ Medical Decision Making/ A&P                           Medical Decision Making Amount and/or Complexity of Data Reviewed Labs: ordered. Radiology: ordered.  Risk Prescription drug management.   History:  Amanda [name] is a 76 y.o. female  with history significant for dementia who presents to the ED accompanied by her family for evaluation of an unwitnessed fall about 4 hours prior to arrival.  Patient's daughter states that patient was wandering around the house when she heard a collateral from the bathroom.  She states patient fell into the tub, although was caught by the shower curtain and appeared dazed.  Daughter assisted her out of the shower, and states that her legs seem to be weak and giving out on her.  She was able to get her into the hallway where she can rest against the wall.  After small mount of time, she was able to escort patient to a bed without any issues.  Patient is not currently on any medications, including blood thinners.  Patient herself states that she is in no pain and has no complaints.  She was ambulatory on arrival. This patient presents to the ED for concern of fall, this involves an extensive number of treatment options, and is a complaint that carries with it a high risk of complications and morbidity.   Concerns for this complaint include fracture, head injury, intracranial bleeding, arrhythmia, ACS  ED course: Initial impression: Patient resting comfortably on bed in no acute distress.  Exam was nonconcerning for head injury, back injury.  She moves all extremities with full range of motion without pain or restricted movement.  I do not believe she sustained any injuries from her fall, though it is concerning that she fell for no reason when she has no previous history.  Additionally, patient's daughter describes what sounds like a postictal phase after the fall.  We will proceed with EKG, labs, and CT head without contrast. Patient was uncooperative during CT head.  CT tech requests mild sedative in order to call patient.  2 mg Versed was ordered with successful completion of CT head.  Overall work-up was very reassuring with no acute findings.  Patient likely needs neurology follow-up suspicious for  seizure.  Lab Tests and EKG:  I Ordered, reviewed, and interpreted labs and EKG.  The pertinent results include: BMP, CBC, troponin all normal.  Respiratory panel negative EKG with normal sinus rhythm   Imaging Studies ordered:  I ordered imaging studies including CT head without contrast which shows no acute intracranial pathology or masses I independently visualized and interpreted imaging and I agree with the radiologist interpretation.   Medicines ordered and prescription drug management:  I ordered medication including: Versed 2 mg for mild sedation for CT Reevaluation of the patient after these medicines showed that the patient improved I have reviewed the patients home medicines and have made adjustments as needed   Disposition:  After consideration of the diagnostic results, physical exam, history and the patients response to treatment feel that the patent would benefit from discharge with strict return precautions.   Fall: No acute findings on work-up today.  Patient may have suffered seizure although cause was unclear from CT head.  Neurology referral provided.  Return precautions were discussed.  All questions were asked and answered.  Patient was discharged home in good condition.   Final Clinical Impression(s) / ED Diagnoses Final diagnoses:  Fall, initial encounter    Rx / DC Orders ED Discharge Orders     None         Amanda [name], Amanda Lacerte R, PA-C 05/22/21 2154    Terald Sleeperrifan, Matthew J, MD 05/23/21 [phone]

## 2021-05-22 NOTE — Discharge Instructions (Addendum)
Clinically his work-up today was very reassuring.  Her CT scan was negative for any new masses or bleeding.  Her lab work looked perfect.  Her EKG was normal.  It is possible that she simply tripped and fell, but given the fact that you state that you saw her trembling afterwards, I provided you a neurology referral for additional evaluation.  Try to keep an eye on her as much as possible to see if it happens again.  If you notice that she continues to fall, you may bring her back to the ED.  Otherwise, please follow-up with neurology for more advanced evaluation.

## 2021-05-22 NOTE — ED Triage Notes (Signed)
Patient here POV from Home with Family.  This AM the Patient fell in the BR at 0900 approximately. Patient slipped on the Floor and fell into the Tub (Caught by the [employer]).  Assisted by Family out of Shower.  No LOC. No History of Blood Thinning Medications. No Complaints. No Pain.  NAD noted during Triage. A&Ox4. GCS 15. Ambulatory.

## 2021-06-19 ENCOUNTER — Encounter: Payer: Self-pay | Admitting: Podiatry

## 2021-06-19 ENCOUNTER — Other Ambulatory Visit: Payer: Self-pay

## 2021-06-19 ENCOUNTER — Ambulatory Visit: Payer: PPO | Admitting: Podiatry

## 2021-06-19 ENCOUNTER — Ambulatory Visit (INDEPENDENT_AMBULATORY_CARE_PROVIDER_SITE_OTHER): Payer: PPO | Admitting: Podiatry

## 2021-06-19 DIAGNOSIS — M79675 Pain in left toe(s): Secondary | ICD-10-CM

## 2021-06-19 DIAGNOSIS — B351 Tinea unguium: Secondary | ICD-10-CM | POA: Diagnosis not present

## 2021-06-19 DIAGNOSIS — M79674 Pain in right toe(s): Secondary | ICD-10-CM

## 2021-06-28 NOTE — Progress Notes (Signed)
?  Subjective:  ?Patient ID: Amanda [name], female    DOB: [birth year],  MRN: [id number] ? ?Amanda [name] presents to clinic today for painful thick toenails that are difficult to trim. Pain interferes with ambulation. Aggravating factors include wearing enclosed shoe gear. Pain is relieved with periodic professional debridement. ? ?Patient is accompanied by her husband on today's visit. Husband voices no new concerns on today's visit. ? ?PCP is Denita Lung, MD , and last visit was October 18, 2019. ? ?No Known Allergies ? ?Review of Systems: Negative except as noted in the HPI. ? ?Objective: ?Physical Exam ? ?General: Amanda [name] is a pleasant 76 y.o. African American female, WD, WN in NAD. AAO x 3.  ? ?Vascular:  ?Capillary refill time to digits immediate b/l. Palpable DP pulse(s) b/l lower extremities Palpable PT pulse(s) b/l lower extremities Pedal hair present. Lower extremity skin temperature gradient within normal limits. No pain with calf compression b/l. No edema noted b/l lower extremities. ? ?Dermatological:  ?Skin warm and supple b/l lower extremities. No open wounds b/l lower extremities. No interdigital macerations b/l lower extremities. Toenails 1-5 b/l elongated, discolored, dystrophic, thickened, crumbly with subungual debris and tenderness to dorsal palpation. ? ?Musculoskeletal:  ?Normal muscle strength 5/5 to all lower extremity muscle groups bilaterally. No pain crepitus or joint limitation noted with ROM b/l lower extremities. No gross bony deformities b/l lower extremities. ? ?Neurological:  ?Patient unable to follow commands of LE neurological examination due to cognitive deficits. Patient does respond to external noxious stimuli.  ? ?HgA1c: ?No flowsheet data found. ? ?Assessment/[name]: ?1. Pain due to onychomycosis of toenails of both feet   ?-Examined patient. ?-Toenails 1-5 b/l were debrided in length and girth with sterile nail nippers and dremel without iatrogenic  bleeding.  ?-Patient/POA to call should there be question/concern in the interim.  ? ?Return in about 3 months (around 09/19/2021). ? ?Marzetta Board, DPM  ?

## 2021-09-23 ENCOUNTER — Encounter: Payer: Self-pay | Admitting: Podiatry

## 2021-09-23 ENCOUNTER — Ambulatory Visit: Payer: PPO | Admitting: Podiatry

## 2021-09-23 DIAGNOSIS — B351 Tinea unguium: Secondary | ICD-10-CM

## 2021-09-23 DIAGNOSIS — M79675 Pain in left toe(s): Secondary | ICD-10-CM

## 2021-09-23 DIAGNOSIS — M79674 Pain in right toe(s): Secondary | ICD-10-CM | POA: Diagnosis not present

## 2021-09-27 NOTE — Progress Notes (Signed)
  Subjective:  Patient ID: Amanda [name], female    DOB: December 31, 1945,  MRN: [id number]  Amanda [name] presents to clinic today for painful elongated mycotic toenails 1-5 bilaterally which are tender when wearing enclosed shoe gear. Pain is relieved with periodic professional debridement.  Patient has h/o dementia and is accompanied by her husband on today's visit.  New problem(s): None.   PCP is [name], MD.  No Known Allergies  Review of Systems: Negative except as noted in the HPI.  Objective: No changes noted in today's physical examination.  General: Amanda [name] is a pleasant 76 y.o. African American female, WD, WN in NAD. AAO x 1.   Vascular:  Capillary refill time to digits immediate b/l. Palpable DP pulse(s) b/l lower extremities Palpable PT pulse(s) b/l lower extremities Pedal hair present. Lower extremity skin temperature gradient within normal limits. No pain with calf compression b/l. No edema noted b/l lower extremities.  Dermatological:  Skin warm and supple b/l lower extremities. No open wounds b/l lower extremities. No interdigital macerations b/l lower extremities. Toenails 1-5 b/l elongated, discolored, dystrophic, thickened, crumbly with subungual debris and tenderness to dorsal palpation.  Musculoskeletal:  Normal muscle strength 5/5 to all lower extremity muscle groups bilaterally. No pain crepitus or joint limitation noted with ROM b/l lower extremities. No gross bony deformities b/l lower extremities.  Neurological:  Patient unable to follow commands of LE neurological examination due to cognitive deficits. Patient does respond to external noxious stimuli.   Assessment/Plan: 1. Pain due to onychomycosis of toenails of both feet     -Patient was evaluated and treated. All patient's and/or POA's questions/concerns answered on today's visit. -No new findings. No new orders. -Mycotic toenails 1-5 bilaterally were debrided in length and  girth with sterile nail nippers and dremel without incident. -Patient/POA to call should there be question/concern in the interim.   Return in about 3 months (around 12/24/2021).  Freddie Breech, DPM

## 2021-10-21 ENCOUNTER — Encounter: Payer: Self-pay | Admitting: Family Medicine

## 2021-12-23 ENCOUNTER — Encounter: Payer: Self-pay | Admitting: Internal Medicine

## 2022-01-04 ENCOUNTER — Ambulatory Visit: Payer: PPO | Admitting: Podiatry

## 2022-01-04 DIAGNOSIS — B351 Tinea unguium: Secondary | ICD-10-CM | POA: Diagnosis not present

## 2022-01-04 DIAGNOSIS — M79674 Pain in right toe(s): Secondary | ICD-10-CM

## 2022-01-04 DIAGNOSIS — M79675 Pain in left toe(s): Secondary | ICD-10-CM | POA: Diagnosis not present

## 2022-01-10 ENCOUNTER — Encounter: Payer: Self-pay | Admitting: Podiatry

## 2022-01-10 NOTE — Progress Notes (Signed)
  Subjective:  Patient ID: Amanda [name], female    DOB: [birth year],  MRN: [id number]  76 y.o. female presents painful, discolored, thick toenails which interfere with daily activities.  Patient has h/o dementia and is accompanied by her husband, Amanda [name], on today's visit. Amanda [name] is her primary caretaker.  New pedal problem(s): None   PCP is Amanda Lung, MD , and last visit was October 18, 2019. She is also followed by Neurology. Last visit was Dr. Ellouise [name] on 03/06/2021.  No Known Allergies  Review of Systems: Negative except as noted in the HPI.   Objective:  Amanda [name] is a pleasant 76 y.o. female thin build in NAD. AAO x 2.  Vascular Examination: Vascular status intact b/l with palpable pedal pulses. CFT immediate b/l. No edema. No pain with calf compression b/l. Skin temperature gradient WNL b/l. Pedal hair sparse.  Neurological Examination: Patient unable to complete sensory examination due to cognition but does respond to external noxious stimuli.   Dermatological Examination: Pedal skin with normal turgor, texture and tone b/l. Toenails 1-5 b/l thick, discolored, elongated with subungual debris and pain on dorsal palpation. No hyperkeratotic lesions noted b/l.   Musculoskeletal Examination: Normal muscle strength 5/5 to all lower extremity muscle groups bilaterally. No pain, crepitus or joint limitation noted with ROM b/l LE. No gross bony pedal deformities b/l. Patient ambulates independently without assistive aids.  Radiographs: None  Assessment:   1. Pain due to onychomycosis of toenails of both feet    [name]:  -Patient's family member present. All questions/concerns addressed on today's visit. -Examined patient. -Toenails 1-5 b/l were debrided in length and girth with sterile nail nippers and dremel without iatrogenic bleeding.  -Patient/POA to call should there be question/concern in the interim.  Return in about 3 months  (around 04/05/2022).  Amanda [name], DPM

## 2022-01-20 ENCOUNTER — Other Ambulatory Visit: Payer: PPO

## 2022-01-26 ENCOUNTER — Encounter: Payer: Self-pay | Admitting: Internal Medicine

## 2022-01-26 ENCOUNTER — Other Ambulatory Visit (INDEPENDENT_AMBULATORY_CARE_PROVIDER_SITE_OTHER): Payer: PPO

## 2022-01-26 DIAGNOSIS — Z23 Encounter for immunization: Secondary | ICD-10-CM

## 2022-02-08 ENCOUNTER — Encounter: Payer: Self-pay | Admitting: Internal Medicine

## 2022-02-23 ENCOUNTER — Ambulatory Visit: Payer: PPO | Admitting: Family Medicine

## 2022-03-09 ENCOUNTER — Telehealth: Payer: Self-pay | Admitting: Family Medicine

## 2022-03-09 NOTE — Telephone Encounter (Signed)
Left message for patient to call back and schedule Medicare Annual Wellness Visit (AWV) either virtually or in office. Left  my Zachery Conch number [phone]    awvi 07/18/12 per [facility]  please schedule with Nurse Health Adviser  I wanted to see if patient could do 04/02/22 same day as spouse    45 min for awv-i and in office appointments 30 min for awv-s  phone/virtual appointments

## 2022-03-16 ENCOUNTER — Encounter: Payer: Self-pay | Admitting: Family Medicine

## 2022-03-16 ENCOUNTER — Ambulatory Visit (INDEPENDENT_AMBULATORY_CARE_PROVIDER_SITE_OTHER): Payer: PPO | Admitting: Family Medicine

## 2022-03-16 VITALS — BP 111/70 | HR 68 | Ht 64.0 in | Wt 133.6 lb

## 2022-03-16 DIAGNOSIS — F03B0 Unspecified dementia, moderate, without behavioral disturbance, psychotic disturbance, mood disturbance, and anxiety: Secondary | ICD-10-CM | POA: Diagnosis not present

## 2022-03-16 DIAGNOSIS — E785 Hyperlipidemia, unspecified: Secondary | ICD-10-CM

## 2022-03-16 DIAGNOSIS — I10 Essential (primary) hypertension: Secondary | ICD-10-CM

## 2022-03-16 NOTE — Progress Notes (Signed)
   Subjective:    Patient ID: Amanda [name], female    DOB: 1945-07-05, 76 y.o.   MRN: [id number]  HPI She is here for an interval evaluation.  She does have a history of dementia.  She was on Aricept in the past but her husband has her off of all his medication and he is taking good care of her with a well-balanced diet with plenty of fresh fruits and vegetables.  He thinks that this has helped him more than anything.  She also sees podiatry to help with her foot care.  He is her primary caregiver and she apparently can take care of bathing feeding and clothing.  They do have a daughter does occasionally help.  She is unaware of her surroundings.   Review of Systems  All other systems reviewed and are negative.      Objective:   Physical Exam Alert and in no distress.  Oriented to person.  Tympanic membranes and canals are normal. Pharyngeal area is normal. Neck is supple without adenopathy or thyromegaly. Cardiac exam shows a regular sinus rhythm without murmurs or gallops. Lungs are clear to auscultation.        Assessment & Plan:  Hyperlipidemia with target low density lipoprotein (LDL) cholesterol less than 130 mg/dL - Plan: Lipid panel  Moderate dementia without behavioral disturbance, psychotic disturbance, mood disturbance, or anxiety, unspecified dementia type (HCC) - Plan: CBC with Differential/Platelet, Comprehensive metabolic panel, Lipid panel, TSH Her husband has a primary caregiver and he seems to be doing quite nicely.  She is quite easy to take care of and does do a lot of her own ADLs so I do not think any further intervention is needed.  Did recommend to go to the drugstore to get Shingrix, Tdap and RSV.

## 2022-03-17 ENCOUNTER — Telehealth: Payer: Self-pay | Admitting: Family Medicine

## 2022-03-17 LAB — COMPREHENSIVE METABOLIC PANEL
ALT: 19 IU/L (ref 0–32)
AST: 17 IU/L (ref 0–40)
Albumin/Globulin Ratio: 1.6 (ref 1.2–2.2)
Albumin: 4.5 g/dL (ref 3.8–4.8)
Alkaline Phosphatase: 62 IU/L (ref 44–121)
BUN/Creatinine Ratio: 24 (ref 12–28)
BUN: 17 mg/dL (ref 8–27)
Bilirubin Total: <0.2 mg/dL (ref 0.0–1.2)
CO2: 24 mmol/L (ref 20–29)
Calcium: 9.7 mg/dL (ref 8.7–10.3)
Chloride: 109 mmol/L — ABNORMAL HIGH (ref 96–106)
Creatinine, Ser: 0.71 mg/dL (ref 0.57–1.00)
Globulin, Total: 2.9 g/dL (ref 1.5–4.5)
Glucose: 75 mg/dL (ref 70–99)
Potassium: 4.4 mmol/L (ref 3.5–5.2)
Sodium: 147 mmol/L — ABNORMAL HIGH (ref 134–144)
Total Protein: 7.4 g/dL (ref 6.0–8.5)
eGFR: 88 mL/min/{1.73_m2} (ref >59)

## 2022-03-17 LAB — CBC WITH DIFFERENTIAL/PLATELET
Basophils Absolute: 0 10*3/uL (ref 0.0–0.2)
Basos: 1 %
EOS (ABSOLUTE): 0.1 10*3/uL (ref 0.0–0.4)
Eos: 3 %
Hematocrit: 40 % (ref 34.0–46.6)
Hemoglobin: 12.8 g/dL (ref 11.1–15.9)
Immature Grans (Abs): 0 10*3/uL (ref 0.0–0.1)
Immature Granulocytes: 0 %
Lymphocytes Absolute: 1.3 10*3/uL (ref 0.7–3.1)
Lymphs: 33 %
MCH: 27.6 pg (ref 26.6–33.0)
MCHC: 32 g/dL (ref 31.5–35.7)
MCV: 86 fL (ref 79–97)
Monocytes Absolute: 0.6 10*3/uL (ref 0.1–0.9)
Monocytes: 14 %
Neutrophils Absolute: 2 10*3/uL (ref 1.4–7.0)
Neutrophils: 49 %
Platelets: 237 10*3/uL (ref 150–450)
RBC: 4.64 x10E6/uL (ref 3.77–5.28)
RDW: 12.4 % (ref 11.7–15.4)
WBC: 4.1 10*3/uL (ref 3.4–10.8)

## 2022-03-17 LAB — TSH: TSH: 2.83 u[IU]/mL (ref 0.450–4.500)

## 2022-03-17 LAB — LIPID PANEL
Chol/HDL Ratio: 2.9 ratio (ref 0.0–4.4)
Cholesterol, Total: 212 mg/dL — ABNORMAL HIGH (ref 100–199)
HDL: 74 mg/dL (ref >39)
LDL Chol Calc (NIH): 125 mg/dL — ABNORMAL HIGH (ref 0–99)
Triglycerides: 76 mg/dL (ref 0–149)
VLDL Cholesterol Cal: 13 mg/dL (ref 5–40)

## 2022-03-17 NOTE — Telephone Encounter (Signed)
Left message for patient to call back and schedule Medicare Annual Wellness Visit (AWV) either virtually or in office. Left  my jabber number [phone]    awvi 07/18/12 per [facility]  please schedule with Nurse Health Adviser   45 min for awv-i and in office appointments 30 min for awv-s  phone/virtual appointments  

## 2022-04-07 ENCOUNTER — Ambulatory Visit: Payer: PPO | Admitting: Podiatry

## 2022-04-26 ENCOUNTER — Telehealth: Payer: Self-pay | Admitting: Family Medicine

## 2022-04-26 NOTE — Telephone Encounter (Signed)
Left message for patient to call back and schedule Medicare Annual Wellness Visit (AWV) either virtually or in office. Left  my Herbie Drape number [phone]     awvi 07/18/12 per [facility]  please schedule with Nurse Health Adviser   45 min for awv-i in office appointments 30 min for awv  phone/virtual appointments

## 2022-05-18 ENCOUNTER — Encounter: Payer: Self-pay | Admitting: Podiatry

## 2022-05-18 ENCOUNTER — Ambulatory Visit: Payer: PPO | Admitting: Podiatry

## 2022-05-18 VITALS — BP 129/54

## 2022-05-18 DIAGNOSIS — M79674 Pain in right toe(s): Secondary | ICD-10-CM

## 2022-05-18 DIAGNOSIS — M79675 Pain in left toe(s): Secondary | ICD-10-CM

## 2022-05-18 DIAGNOSIS — B351 Tinea unguium: Secondary | ICD-10-CM | POA: Diagnosis not present

## 2022-05-18 NOTE — Progress Notes (Signed)
  Subjective:  Patient ID: Amanda [name], female    DOB: Dec 22, 1945,  MRN: [id number]  Amanda [name] presents to clinic today for painful thick toenails that are difficult to trim. Pain interferes with ambulation. Aggravating factors include wearing enclosed shoe gear. Pain is relieved with periodic professional debridement.  Chief Complaint  Patient presents with   Nail Problem    [facility] PCP-[facility] PCP VST-03/2022   New problem(s): None.   PCP is Denita Lung, MD.  No Known Allergies  Review of Systems: Negative except as noted in the HPI.  Objective: No changes noted in today's physical examination. Vitals:   05/18/22 1529  BP: (!) 129/54   Amanda [name] is a pleasant 77 y.o. female  pleasantly confused in NAD. AAO x 1.  Vascular Examination: Vascular status intact b/l with palpable pedal pulses. CFT immediate b/l. No edema. No pain with calf compression b/l. Skin temperature gradient WNL b/l. Pedal hair sparse.  Neurological Examination: Patient unable to complete sensory examination due to cognition but does respond to external noxious stimuli.   Dermatological Examination: Pedal skin with normal turgor, texture and tone b/l. Toenails 1-5 b/l thick, discolored, elongated with subungual debris and pain on dorsal palpation. No hyperkeratotic lesions noted b/l.   Musculoskeletal Examination: Normal muscle strength 5/5 to all lower extremity muscle groups bilaterally. No pain, crepitus or joint limitation noted with ROM b/l LE. No gross bony pedal deformities b/l. Patient ambulates independently without assistive aids.  Radiographs: None   Assessment/[name]: 1. Pain due to onychomycosis of toenails of both feet     -Consent given for treatment as described below: -Examined patient. -Patient to continue soft, supportive shoe gear daily. -Mycotic toenails 1-5 bilaterally were debrided in length and girth with sterile nail nippers and dremel without  incident. -Patient/POA to call should there be question/concern in the interim.   Return in about 3 months (around 08/17/2022).  Amanda [name], DPM

## 2022-07-27 ENCOUNTER — Encounter: Payer: Self-pay | Admitting: Family Medicine

## 2022-07-27 ENCOUNTER — Ambulatory Visit (INDEPENDENT_AMBULATORY_CARE_PROVIDER_SITE_OTHER): Payer: PPO | Admitting: Family Medicine

## 2022-07-27 VITALS — BP 110/62 | HR 80 | Temp 98.6°F | Resp 20 | Wt 141.0 lb

## 2022-07-27 DIAGNOSIS — R21 Rash and other nonspecific skin eruption: Secondary | ICD-10-CM | POA: Diagnosis not present

## 2022-07-27 NOTE — Progress Notes (Signed)
   Subjective:    Patient ID: Mendel Ryder, female    DOB: Sep 14, 1945, 77 y.o.   MRN: [id number]  HPI She is here for evaluation of a rash that she has had for the last couple weeks.  She states that it is on the inside of her thigh.  Her husband is here with her.  He has been using Neosporin ointment on it.  He thinks is getting slightly better.   Review of Systems     Objective:   Physical Exam Exam of the medial thighs shows patchy erythematous slightly dry area of scattered lesions.  No drainage.       Assessment & Plan:  Rash It is a nonspecific rash that apparently is getting better.  Explained that he can continue to use Neosporin on that but if it does not go away or recurs, I will refer to dermatology.  They were both comfortable with that.

## 2022-08-19 DIAGNOSIS — L308 Other specified dermatitis: Secondary | ICD-10-CM | POA: Diagnosis not present

## 2022-08-19 DIAGNOSIS — L989 Disorder of the skin and subcutaneous tissue, unspecified: Secondary | ICD-10-CM | POA: Diagnosis not present

## 2022-08-19 DIAGNOSIS — D485 Neoplasm of uncertain behavior of skin: Secondary | ICD-10-CM | POA: Diagnosis not present

## 2022-08-19 DIAGNOSIS — L988 Other specified disorders of the skin and subcutaneous tissue: Secondary | ICD-10-CM | POA: Diagnosis not present

## 2022-08-19 DIAGNOSIS — L309 Dermatitis, unspecified: Secondary | ICD-10-CM | POA: Diagnosis not present

## 2022-08-19 DIAGNOSIS — L298 Other pruritus: Secondary | ICD-10-CM | POA: Diagnosis not present

## 2022-08-25 ENCOUNTER — Ambulatory Visit: Payer: PPO | Admitting: Podiatry

## 2022-08-25 ENCOUNTER — Encounter: Payer: Self-pay | Admitting: Podiatry

## 2022-08-25 DIAGNOSIS — B351 Tinea unguium: Secondary | ICD-10-CM | POA: Diagnosis not present

## 2022-08-25 DIAGNOSIS — M79674 Pain in right toe(s): Secondary | ICD-10-CM

## 2022-08-25 DIAGNOSIS — M79675 Pain in left toe(s): Secondary | ICD-10-CM

## 2022-08-30 NOTE — Progress Notes (Signed)
  Subjective:  Patient ID: Amanda [name], female    DOB: Sep 09, 1945,  MRN: [id number]  77 y.o. female presents for painful mycotic toenails bilaterally. Accompanied by her husband. Pt has h/o dementia. Chief Complaint  Patient presents with   Nail Problem    RFC-PCPs Type   [name], MD,LPCPOV:04/24     New pedal problem(s): None   PCP is [name], MD.  No Known Allergies  Review of Systems: Negative except as noted in the HPI.   Objective:  Amanda [name] is a pleasant 77 y.o. female in NAD. AAO x 1.  Vascular Examination: Vascular status intact b/l with palpable pedal pulses. CFT immediate b/l. No edema. No pain with calf compression b/l. Skin temperature gradient WNL b/l. Pedal hair sparse.  Neurological Examination: Patient unable to follow commands of LE neurological examination due to cognitive deficits. Patient does respond to external noxious stimuli.  Dermatological Examination: Pedal skin with normal turgor, texture and tone b/l. Toenails 1-5 b/l thick, discolored, elongated with subungual debris and pain on dorsal palpation. No hyperkeratotic lesions noted b/l.   Musculoskeletal Examination: Normal muscle strength 5/5 to all lower extremity muscle groups bilaterally. No pain, crepitus or joint limitation noted with ROM b/l LE. No gross bony pedal deformities b/l. Patient ambulates independently without assistive aids.  Radiographs: None  Assessment:   1. Pain due to onychomycosis of toenails of both feet    Plan:  -Patient with h/o dementia/Alzheimer's/cognitive deficit. Patient's family member present. All questions/concerns addressed on today's visit. -Continue supportive shoe gear daily. -Toenails 1-5 b/l were debrided in length and girth with sterile nail nippers and dremel without iatrogenic bleeding.  -Patient/POA to call should there be question/concern in the interim.  Return in about 3 months (around 11/25/2022).  Freddie Breech, DPM

## 2022-09-06 ENCOUNTER — Encounter (HOSPITAL_COMMUNITY): Payer: Self-pay | Admitting: Emergency Medicine

## 2022-09-06 ENCOUNTER — Other Ambulatory Visit: Payer: Self-pay

## 2022-09-06 ENCOUNTER — Emergency Department (HOSPITAL_COMMUNITY)
Admission: EM | Admit: 2022-09-06 | Discharge: 2022-09-07 | Disposition: A | Discharge status: Home / Self Care | Payer: PPO | Attending: Emergency Medicine | Admitting: Emergency Medicine

## 2022-09-06 DIAGNOSIS — I10 Essential (primary) hypertension: Secondary | ICD-10-CM | POA: Insufficient documentation

## 2022-09-06 DIAGNOSIS — R1084 Generalized abdominal pain: Secondary | ICD-10-CM | POA: Diagnosis not present

## 2022-09-06 DIAGNOSIS — F0390 Unspecified dementia without behavioral disturbance: Secondary | ICD-10-CM | POA: Insufficient documentation

## 2022-09-06 DIAGNOSIS — R1032 Left lower quadrant pain: Secondary | ICD-10-CM | POA: Insufficient documentation

## 2022-09-06 DIAGNOSIS — R5383 Other fatigue: Secondary | ICD-10-CM | POA: Diagnosis not present

## 2022-09-06 DIAGNOSIS — R410 Disorientation, unspecified: Secondary | ICD-10-CM | POA: Insufficient documentation

## 2022-09-06 DIAGNOSIS — K8020 Calculus of gallbladder without cholecystitis without obstruction: Secondary | ICD-10-CM | POA: Diagnosis not present

## 2022-09-06 DIAGNOSIS — R1030 Lower abdominal pain, unspecified: Secondary | ICD-10-CM | POA: Diagnosis not present

## 2022-09-06 DIAGNOSIS — R109 Unspecified abdominal pain: Secondary | ICD-10-CM

## 2022-09-06 LAB — COMPREHENSIVE METABOLIC PANEL
ALT: 23 U/L (ref 0–44)
AST: 27 U/L (ref 15–41)
Albumin: 4 g/dL (ref 3.5–5.0)
Alkaline Phosphatase: 57 U/L (ref 38–126)
Anion gap: 13 (ref 5–15)
BUN: 11 mg/dL (ref 8–23)
CO2: 19 mmol/L — ABNORMAL LOW (ref 22–32)
Calcium: 9.1 mg/dL (ref 8.9–10.3)
Chloride: 102 mmol/L (ref 98–111)
Creatinine, Ser: 0.81 mg/dL (ref 0.44–1.00)
GFR, Estimated: >60 mL/min (ref >60)
Glucose, Bld: 110 mg/dL — ABNORMAL HIGH (ref 70–99)
Potassium: 4.5 mmol/L (ref 3.5–5.1)
Sodium: 134 mmol/L — ABNORMAL LOW (ref 135–145)
Total Bilirubin: 0.5 mg/dL (ref 0.3–1.2)
Total Protein: 8 g/dL (ref 6.5–8.1)

## 2022-09-06 LAB — CBC WITH DIFFERENTIAL/PLATELET
Abs Immature Granulocytes: 0.02 10*3/uL (ref 0.00–0.07)
Basophils Absolute: 0 10*3/uL (ref 0.0–0.1)
Basophils Relative: 0 %
Eosinophils Absolute: 0 10*3/uL (ref 0.0–0.5)
Eosinophils Relative: 0 %
HCT: 43.1 % (ref 36.0–46.0)
Hemoglobin: 13.8 g/dL (ref 12.0–15.0)
Immature Granulocytes: 0 %
Lymphocytes Relative: 10 %
Lymphs Abs: 0.8 10*3/uL (ref 0.7–4.0)
MCH: 28.2 pg (ref 26.0–34.0)
MCHC: 32 g/dL (ref 30.0–36.0)
MCV: 88.1 fL (ref 80.0–100.0)
Monocytes Absolute: 0.4 10*3/uL (ref 0.1–1.0)
Monocytes Relative: 4 %
Neutro Abs: 6.9 10*3/uL (ref 1.7–7.7)
Neutrophils Relative %: 86 %
Platelets: 236 10*3/uL (ref 150–400)
RBC: 4.89 MIL/uL (ref 3.87–5.11)
RDW: 12.6 % (ref 11.5–15.5)
WBC: 8 10*3/uL (ref 4.0–10.5)
nRBC: 0 % (ref 0.0–0.2)

## 2022-09-06 MED ORDER — SODIUM CHLORIDE 0.9 % IV BOLUS
1000.0000 mL | Freq: Once | INTRAVENOUS | Status: AC
  Administered 2022-09-06: 1000 mL via INTRAVENOUS

## 2022-09-06 NOTE — ED Triage Notes (Signed)
Patient BIB [facility] from home c/o a possible UTI.  Patient has hx of dementia and patient's husband reports patient is more confused.  Patient has strong smell of urine.

## 2022-09-06 NOTE — ED Provider Notes (Signed)
[place] EMERGENCY DEPARTMENT AT [facility] Provider Note  CSN: [id number] Arrival date & time: 09/06/22 2249  Chief Complaint(s) Urinary Tract Infection  HPI Amanda [name] is a 77 y.o. female with a past medical history listed below including dementia who presents to the emergency department accompanied by her husband who reports that the patient has been more fatigued.  Normally the patient is active.  Today she spent most of the day in bed.  She also complained of left lower quadrant abdominal discomfort.  She had dry heaving without emesis.  No known fevers.  No diarrhea.  Last bowel movement was yesterday.  The history is provided by the spouse.   Past Medical History Past Medical History:  Diagnosis Date   Dementia (HCC)    Dyslipidemia    Patient Active Problem List   Diagnosis Date Noted   Moderate dementia (HCC) 10/02/2014   Hyperlipidemia with target low density lipoprotein (LDL) cholesterol less than 130 mg/dL 86/57/8469   Hypertension 06/12/2012   Home Medication(s) Prior to Admission medications   Not on File                                                                                                                                    Allergies Patient has no known allergies.  Review of Systems Review of Systems As noted in HPI  Physical Exam Vital Signs  I have reviewed the triage vital signs BP (!) 144/72   Pulse 73   Temp 99.3 F (37.4 C) (Axillary)   Resp 19   Ht 5\' 4"  (1.626 m)   Wt 65 kg   SpO2 100%   BMI 24.60 kg/m   Physical Exam Vitals reviewed.  Constitutional:      General: She is not in acute distress.    Appearance: She is well-developed. She is not diaphoretic.  HENT:     Head: Normocephalic and atraumatic.     Right Ear: External ear normal.     Left Ear: External ear normal.     Nose: Nose normal.  Eyes:     General: No scleral icterus.    Conjunctiva/sclera: Conjunctivae normal.  Neck:     Trachea:  Phonation normal.  Cardiovascular:     Rate and Rhythm: Normal rate and regular rhythm.  Pulmonary:     Effort: Pulmonary effort is normal. No respiratory distress.     Breath sounds: No stridor.  Abdominal:     General: There is no distension.     Tenderness: There is no abdominal tenderness.  Musculoskeletal:        General: Normal range of motion.     Cervical back: Normal range of motion.  Neurological:     Mental Status: She is alert. She is disoriented.  Psychiatric:        Behavior: Behavior normal.     ED Results and Treatments Labs (all labs ordered are listed,  but only abnormal results are displayed) Labs Reviewed  COMPREHENSIVE METABOLIC PANEL - Abnormal; Notable for the following components:      Result Value   Sodium 134 (*)    CO2 19 (*)    Glucose, Bld 110 (*)    All other components within normal limits  URINALYSIS, W/ REFLEX TO CULTURE (INFECTION SUSPECTED)  CBC WITH DIFFERENTIAL/PLATELET                                                                                                                         EKG  EKG Interpretation  Date/Time:  Monday Sep 06 2022 22:54:34 EDT Ventricular Rate:  80 PR Interval:  157 QRS Duration: 85 QT Interval:  398 QTC Calculation: 460 R Axis:   84 Text Interpretation: Sinus rhythm Consider right atrial enlargement Borderline right axis deviation No significant change was found Confirmed by Drema Pry [phone]) on 09/06/2022 11:50:22 PM       Radiology CT ABDOMEN PELVIS W CONTRAST  Result Date: 09/07/2022 CLINICAL DATA:  Left lower quadrant abdominal pain.  Possible UTI. EXAM: CT ABDOMEN AND PELVIS WITH CONTRAST TECHNIQUE: Multidetector CT imaging of the abdomen and pelvis was performed using the standard protocol following bolus administration of intravenous contrast. RADIATION DOSE REDUCTION: This exam was performed according to the departmental dose-optimization program which includes automated exposure control,  adjustment of the mA and/or kV according to patient size and/or use of iterative reconstruction technique. CONTRAST:  75mL OMNIPAQUE IOHEXOL 350 MG/ML SOLN COMPARISON:  None Available. FINDINGS: Lower chest: Mild atelectasis is present at the lung bases. Hepatobiliary: Hypodensity is present in the left lobe of the liver, statistically most likely representing cysts or hemangioma. No biliary ductal dilatation. Stones are present within the gallbladder. Pancreas: Unremarkable. No pancreatic ductal dilatation or surrounding inflammatory changes. Spleen: Normal in size without focal abnormality. Adrenals/Urinary Tract: The adrenal glands are within normal limits. The kidneys enhance symmetrically. No renal calculus or hydronephrosis. The bladder is unremarkable Stomach/Bowel: Stomach is within normal limits. Appendix is not seen. No evidence of bowel wall thickening, distention, or inflammatory changes. No free air or pneumatosis. A large amount of stool is noted in the rectum. Vascular/Lymphatic: Aortic atherosclerosis. No enlarged abdominal or pelvic lymph nodes. Reproductive: Status post hysterectomy. No adnexal masses. Other: No abdominopelvic ascites. Musculoskeletal: Degenerative changes are present in the thoracolumbar spine. No acute osseous abnormality. IMPRESSION: 1. No renal calculus or obstructive uropathy bilaterally. 2. Cholelithiasis. 3. Large amount of stool in the rectum. 4. Aortic atherosclerosis and coronary artery calcifications. Electronically Signed   By: Thornell Sartorius M.D.   On: 09/07/2022 01:29    Medications Ordered in ED Medications  sodium chloride 0.9 % bolus 1,000 mL (0 mLs Intravenous Stopped 09/07/22 0108)  iohexol (OMNIPAQUE) 350 MG/ML injection 75 mL (75 mLs Intravenous Contrast Given 09/07/22 0121)   Procedures Procedures  (including critical care time) Medical Decision Making / ED Course  Click here for ABCD2, HEART and other calculators  Medical Decision  Making Amount  and/or Complexity of Data Reviewed Labs: ordered. Radiology: ordered.  Risk Prescription drug management.    The patient presents to the ED for the following: Fatigue LLQ pain   This involves an extensive number of treatment options, and is a complaint that carries with it a high risk of complications and morbidity. The differential diagnosis includes but not limited to:  Infection such as UTI or diverticulitis.  Will assess for any electrolyte or metabolic derangements.  Also assess for any severe anemia.  Will check for renal insufficiency. No focal deficit concerning for ICH or stroke  Co-morbidities/SDOH that complicate the patient evaluation/care: dementia   Work up Interpretation and Management:  Cardiac Monitoring/EKG: EKG obtained in triage without acute ischemic changes dysrhythmias or blocks.  Laboratory Tests ordered listed below with my independent interpretation: CBC without leukocytosis or anemia Metabolic panel without significant electrolyte derangement or renal sufficiency. UA without evidence of infection    Imaging Studies ordered listed below with my independent interpretation: CT negative for acute intra-abdominal inflammatory/infectious process or bowel obstruction.  Patient has large amount of stool in the rectum which was likely causing her abdominal pain previously.  ED Course: Workup is reassuring. Will recommend laxatives at home if patient does not have a bowel movement in 1-2 days.      Final Clinical Impression(s) / ED Diagnoses Final diagnoses:  Other fatigue  Abdominal discomfort   The patient appears reasonably screened and/or stabilized for discharge and I doubt any other medical condition or other [facility] requiring further screening, evaluation, or treatment in the ED at this time. I have discussed the findings, Dx and Tx plan with the patient/family who expressed understanding and agree(s) with the plan. Discharge instructions discussed at  length. The patient/family was given strict return precautions who verbalized understanding of the instructions. No further questions at time of discharge.  Disposition: Discharge  Condition: Good  ED Discharge Orders     None         Follow Up: [name], MD [address] [place] [address] [phone]  Call  to schedule an appointment for close follow up           This chart was dictated using voice recognition software.  Despite best efforts to proofread,  errors can occur which can change the documentation meaning.    Nira Conn, MD 09/07/22 [phone]

## 2022-09-07 ENCOUNTER — Emergency Department (HOSPITAL_COMMUNITY): Payer: PPO

## 2022-09-07 DIAGNOSIS — K8020 Calculus of gallbladder without cholecystitis without obstruction: Secondary | ICD-10-CM | POA: Diagnosis not present

## 2022-09-07 DIAGNOSIS — R1032 Left lower quadrant pain: Secondary | ICD-10-CM | POA: Diagnosis not present

## 2022-09-07 LAB — URINALYSIS, W/ REFLEX TO CULTURE (INFECTION SUSPECTED)
Bacteria, UA: NONE SEEN
Bilirubin Urine: NEGATIVE
Glucose, UA: NEGATIVE mg/dL
Hgb urine dipstick: NEGATIVE
Ketones, ur: NEGATIVE mg/dL
Leukocytes,Ua: NEGATIVE
Nitrite: NEGATIVE
Protein, ur: NEGATIVE mg/dL
Specific Gravity, Urine: 1.016 (ref 1.005–1.030)
pH: 7 (ref 5.0–8.0)

## 2022-09-07 MED ORDER — IOHEXOL 350 MG/ML SOLN
75.0000 mL | Freq: Once | INTRAVENOUS | Status: AC | PRN
  Administered 2022-09-07: 75 mL via INTRAVENOUS

## 2022-09-14 ENCOUNTER — Ambulatory Visit (INDEPENDENT_AMBULATORY_CARE_PROVIDER_SITE_OTHER): Payer: PPO

## 2022-09-14 VITALS — Ht 65.0 in | Wt 138.0 lb

## 2022-09-14 DIAGNOSIS — Z0000 Encounter for general adult medical examination without abnormal findings: Secondary | ICD-10-CM

## 2022-09-14 NOTE — Progress Notes (Signed)
I connected with  Amanda [name] on 09/14/22 by a audio enabled telemedicine application and verified that I am speaking with the correct person using two identifiers. Spouse [name] was also on the call.  Patient Location: Home  Provider Location: Office/Clinic  I discussed the limitations of evaluation and management by telemedicine. The patient expressed understanding and agreed to proceed.  Subjective:   Amanda [name] is a 77 y.o. female who presents for an Initial Medicare Annual Wellness Visit.  Review of Systems     Cardiac Risk Factors include: advanced age (>38men, >36 women);hypertension     Objective:    Today's Vitals   09/14/22 1457  Weight: 138 lb (62.6 kg)  Height: 5\' 5"  (1.651 m)   Body mass index is 22.96 kg/m.     09/14/2022    3:06 PM 09/06/2022   10:51 PM 05/22/2021   11:15 AM 03/06/2021    2:34 PM 11/22/2019    4:01 PM 03/26/2017    4:01 PM  Advanced Directives  Does Patient Have a Medical Advance Directive? No No No No No No  Would patient like information on creating a medical advance directive?   No - Patient declined       Current Medications (verified) Outpatient Encounter Medications as of 09/14/2022  Medication Sig   triamcinolone cream (KENALOG) 0.1 % Apply topically 2 (two) times daily as needed.   No facility-administered encounter medications on file as of 09/14/2022.    Allergies (verified) Patient has no known allergies.   History: Past Medical History:  Diagnosis Date   Dementia (HCC)    Dyslipidemia    History reviewed. No pertinent surgical history. Family History  Problem Relation Age of Onset   Dementia Mother    Heart disease Maternal Grandmother    Social History   Socioeconomic History   Marital status: Married    Spouse name: Not on file   Number of children: 2   Years of education: Not on file   Highest education level: Not on file  Occupational History   Occupation: Retired  Tobacco Use   Smoking  status: Never   Smokeless tobacco: Never  Vaping Use   Vaping Use: Never used  Substance and Sexual Activity   Alcohol use: No    Alcohol/week: 0.0 standard drinks of alcohol   Drug use: No   Sexual activity: Yes    Partners: Male  Other Topics Concern   Not on file  Social History Narrative   Pt lives in 2 story home with her husband and children   Has 2 adult children   12th grade education   Social Determinants of Health   Financial Resource Strain: Low Risk  (09/14/2022)   Overall Financial Resource Strain (CARDIA)    Difficulty of Paying Living Expenses: Not hard at all  Food Insecurity: No Food Insecurity (09/14/2022)   Hunger Vital Sign    Worried About Running Out of Food in the Last Year: Never true    Ran Out of Food in the Last Year: Never true  Transportation Needs: No Transportation Needs (09/14/2022)   PRAPARE - Administrator, Civil Service (Medical): No    Lack of Transportation (Non-Medical): No  Physical Activity: Inactive (09/14/2022)   Exercise Vital Sign    Days of Exercise per Week: 0 days    Minutes of Exercise per Session: 0 min  Stress: No Stress Concern Present (09/14/2022)   [employer] of Occupational Health - Occupational Stress Questionnaire  Feeling of Stress : Not at all  Social Connections: Not on file    Tobacco Counseling Counseling given: Not Answered   Clinical Intake:  Pre-visit preparation completed: Yes  Pain : No/denies pain     Nutritional Status: BMI of 19-24  Normal Nutritional Risks: None Diabetes: No  How often do you need to have someone help you when you read instructions, pamphlets, or other written materials from your doctor or pharmacy?: 4 - Often  Diabetic? no  Interpreter Needed?: No  Information entered by :: NAllen LPN   Activities of Daily Living    09/14/2022    3:09 PM  In your present state of health, do you have any difficulty performing the following activities:  Hearing? 0   Vision? 0  Difficulty concentrating or making decisions? 1  Walking or climbing stairs? 0  Dressing or bathing? 1  Doing errands, shopping? 1  Preparing Food and eating ? N  Using the Toilet? N  In the past six months, have you accidently leaked urine? N  Do you have problems with loss of bowel control? N  Managing your Medications? N  Comment no medications  Managing your Finances? N  Housekeeping or managing your Housekeeping? N    Patient Care Team: [name], MD as PCP - General (Family Medicine) Van Clines, MD as Consulting Physician (Neurology)  Indicate any recent Medical Services you may have received from other than [facility] providers in the past year (date may be approximate).     Assessment:   This is a routine wellness examination for Amanda [name].  Hearing/Vision screen Vision Screening - Comments:: Regular eye exams, [facility]  Dietary issues and exercise activities discussed: Current Exercise Habits: The patient does not participate in regular exercise at present   Goals Addressed             This Visit's Progress    Patient Stated       09/14/2022, would like memory to be better       Depression Screen    09/14/2022    3:09 PM 07/27/2022    3:35 PM 10/18/2019   10:18 AM 04/26/2017    2:40 PM 03/15/2016   11:41 AM 05/30/2014    3:07 PM  PHQ 2/9 Scores  PHQ - 2 Score 0 0 1 0 0 0  Exception Documentation    Patient refusal      Fall Risk    09/14/2022    3:09 PM 07/27/2022    3:34 PM 03/06/2021    2:34 PM 11/22/2019    4:01 PM 10/18/2019   10:17 AM  Fall Risk   Falls in the past year? 0 0 0 0 0  Number falls in past yr: 0 0 0 0   Injury with Fall? 0 0 0 0   Risk for fall due to : No Fall Risks No Fall Risks     Follow up Falls prevention discussed;Education provided;Falls evaluation completed Falls evaluation completed       FALL RISK PREVENTION PERTAINING TO THE HOME:  Any stairs in or around the home? Yes  If so, are there any  without handrails? No  Home free of loose throw rugs in walkways, pet beds, electrical cords, etc? Yes  Adequate lighting in your home to reduce risk of falls? Yes   ASSISTIVE DEVICES UTILIZED TO PREVENT FALLS:  Life alert? No  Use of a cane, walker or w/c? No  Grab bars in the bathroom? No  Shower chair or bench in shower? No  Elevated toilet seat or a handicapped toilet? No   TIMED UP AND GO:  Was the test performed? No .      Cognitive Function:  6 CIT not administered. Patient has diagnosis of dementia.     11/22/2019    4:00 PM 01/09/2018    2:00 PM 10/02/2014   10:01 AM  MMSE - Mini Mental State Exam  Orientation to time 0 1 2  Orientation to Place 2 4 5   Registration 3 3 3   Attention/ Calculation 0 2 4  Recall 0 0 0  Language- name 2 objects 1 2 2   Language- repeat 0 1 1  Language- follow 3 step command 0 1 3  Language- read & follow direction 0 0 1  Write a sentence 0 0 1  Copy design 0 0 1  Total score 6 14 23         Immunizations Immunization History  Administered Date(s) Administered   Fluad Quad(high Dose 65+) 01/26/2022   Influenza, High Dose Seasonal PF 02/15/2013, 05/07/2015, 03/15/2016, 03/24/2017   Influenza,inj,Quad PF,6+ Mos 05/30/2014   PFIZER Comirnaty(Gray Top)Covid-19 Tri-Sucrose Vaccine 10/30/2020, 01/26/2022   PFIZER(Purple Top)SARS-COV-2 Vaccination 06/01/2019, 06/26/2019, 03/06/2020   Pfizer Covid-19 Vaccine Bivalent Booster 97yrs & up 04/24/2021   Pneumococcal Conjugate-13 05/30/2014   Pneumococcal Polysaccharide-23 03/15/2016    TDAP status: Due, Education has been provided regarding the importance of this vaccine. Advised may receive this vaccine at local pharmacy or Health Dept. Aware to provide a copy of the vaccination record if obtained from local pharmacy or Health Dept. Verbalized acceptance and understanding.  Flu Vaccine status: Up to date  Pneumococcal vaccine status: Up to date  Covid-19 vaccine status: Completed  vaccines  Qualifies for Shingles Vaccine? Yes   Zostavax completed No   Shingrix Completed?: No.    Education has been provided regarding the importance of this vaccine. Patient has been advised to call insurance company to determine out of pocket expense if they have not yet received this vaccine. Advised may also receive vaccine at local pharmacy or Health Dept. Verbalized acceptance and understanding.  Screening Tests Health Maintenance  Topic Date Due   Medicare Annual Wellness (AWV)  Never done   DTaP/Tdap/Td (1 - Tdap) Never done   Zoster Vaccines- Shingrix (1 of 2) Never done   DEXA SCAN  Never done   COVID-19 Vaccine (7 - 2023-24 season) 03/23/2022   INFLUENZA VACCINE  11/18/2022   Pneumonia Vaccine 59+ Years old  Completed   Hepatitis C Screening  Completed   HPV VACCINES  Aged Out    Health Maintenance  Health Maintenance Due  Topic Date Due   Medicare Annual Wellness (AWV)  Never done   DTaP/Tdap/Td (1 - Tdap) Never done   Zoster Vaccines- Shingrix (1 of 2) Never done   DEXA SCAN  Never done   COVID-19 Vaccine (7 - 2023-24 season) 03/23/2022    Colorectal cancer screening: No longer required.   Mammogram status: No longer required due to age.  Bone Density status: due  Lung Cancer Screening: (Low Dose CT Chest recommended if Age 66-80 years, 30 pack-year currently smoking OR have quit w/in 15years.) does not qualify.   Lung Cancer Screening Referral: no  Additional Screening:  Hepatitis C Screening: does qualify; Completed 03/15/2016  Vision Screening: Recommended annual ophthalmology exams for early detection of glaucoma and other disorders of the eye. Is the patient up to date with their annual eye exam?  Yes  Who is the provider or what is the name of the office in which the patient attends annual eye exams? [facility] If pt is not established with a provider, would they like to be referred to a provider to establish care? No .   Dental Screening:  Recommended annual dental exams for proper oral hygiene  Community Resource Referral / Chronic Care Management: CRR required this visit?  No   CCM required this visit?  No      Plan:     I have personally reviewed and noted the following in the patient's chart:   Medical and social history Use of alcohol, tobacco or illicit drugs  Current medications and supplements including opioid prescriptions. Patient is not currently taking opioid prescriptions. Functional ability and status Nutritional status Physical activity Advanced directives List of other physicians Hospitalizations, surgeries, and ER visits in previous 12 months Vitals Screenings to include cognitive, depression, and falls Referrals and appointments  In addition, I have reviewed and discussed with patient certain preventive protocols, quality metrics, and best practice recommendations. A written personalized care plan for preventive services as well as general preventive health recommendations were provided to patient.     Barb Merino, LPN   07/26/8117   Nurse Notes: none  Due to this being a virtual visit, the after visit summary with patients personalized plan was offered to patient via mail or my-chart.  to pick up at office at next visit

## 2022-09-14 NOTE — Patient Instructions (Signed)
Amanda [name] , Thank you for taking time to come for your Medicare Wellness Visit. I appreciate your ongoing commitment to your health goals. Please review the following plan we discussed and let me know if I can assist you in the future.   These are the goals we discussed:  Goals      Patient Stated     09/14/2022, would like memory to be better        This is a list of the screening recommended for you and due dates:  Health Maintenance  Topic Date Due   DTaP/Tdap/Td vaccine (1 - Tdap) Never done   Zoster (Shingles) Vaccine (1 of 2) Never done   DEXA scan (bone density measurement)  Never done   COVID-19 Vaccine (7 - 2023-24 season) 03/23/2022   Flu Shot  11/18/2022   Medicare Annual Wellness Visit  09/14/2023   Pneumonia Vaccine  Completed   Hepatitis C Screening  Completed   HPV Vaccine  Aged Out    Advanced directives: Advance directive discussed with you today.   Conditions/risks identified: none  Next appointment: Follow up in one year for your annual wellness visit    Preventive Care 65 Years and Older, Female Preventive care refers to lifestyle choices and visits with your health care provider that can promote health and wellness. What does preventive care include? A yearly physical exam. This is also called an annual well check. Dental exams once or twice a year. Routine eye exams. Ask your health care provider how often you should have your eyes checked. Personal lifestyle choices, including: Daily care of your teeth and gums. Regular physical activity. Eating a healthy diet. Avoiding tobacco and drug use. Limiting alcohol use. Practicing safe sex. Taking low-dose aspirin every day. Taking vitamin and mineral supplements as recommended by your health care provider. What happens during an annual well check? The services and screenings done by your health care provider during your annual well check will depend on your age, overall health, lifestyle risk  factors, and family history of disease. Counseling  Your health care provider may ask you questions about your: Alcohol use. Tobacco use. Drug use. Emotional well-being. Home and relationship well-being. Sexual activity. Eating habits. History of falls. Memory and ability to understand (cognition). Work and work Astronomer. Reproductive health. Screening  You may have the following tests or measurements: Height, weight, and BMI. Blood pressure. Lipid and cholesterol levels. These may be checked every 5 years, or more frequently if you are over 73 years old. Skin check. Lung cancer screening. You may have this screening every year starting at age 74 if you have a 30-pack-year history of smoking and currently smoke or have quit within the past 15 years. Fecal occult blood test (FOBT) of the stool. You may have this test every year starting at age 62. Flexible sigmoidoscopy or colonoscopy. You may have a sigmoidoscopy every 5 years or a colonoscopy every 10 years starting at age 65. Hepatitis C blood test. Hepatitis B blood test. Sexually transmitted disease (STD) testing. Diabetes screening. This is done by checking your blood sugar (glucose) after you have not eaten for a while (fasting). You may have this done every 1-3 years. Bone density scan. This is done to screen for osteoporosis. You may have this done starting at age 79. Mammogram. This may be done every 1-2 years. Talk to your health care provider about how often you should have regular mammograms. Talk with your health care provider about your test results,  treatment options, and if necessary, the need for more tests. Vaccines  Your health care provider may recommend certain vaccines, such as: Influenza vaccine. This is recommended every year. Tetanus, diphtheria, and acellular pertussis (Tdap, Td) vaccine. You may need a Td booster every 10 years. Zoster vaccine. You may need this after age 18. Pneumococcal 13-valent  conjugate (PCV13) vaccine. One dose is recommended after age 67. Pneumococcal polysaccharide (PPSV23) vaccine. One dose is recommended after age 92. Talk to your health care provider about which screenings and vaccines you need and how often you need them. This information is not intended to replace advice given to you by your health care provider. Make sure you discuss any questions you have with your health care provider. Document Released: 05/02/2015 Document Revised: 12/24/2015 Document Reviewed: 02/04/2015 Elsevier Interactive Patient Education  2017 [employer].  Fall Prevention in the Home Falls can cause injuries. They can happen to people of all ages. There are many things you can do to make your home safe and to help prevent falls. What can I do on the outside of my home? Regularly fix the edges of walkways and driveways and fix any cracks. Remove anything that might make you trip as you walk through a door, such as a raised step or threshold. Trim any bushes or trees on the path to your home. Use bright outdoor lighting. Clear any walking paths of anything that might make someone trip, such as rocks or tools. Regularly check to see if handrails are loose or broken. Make sure that both sides of any steps have handrails. Any raised decks and porches should have guardrails on the edges. Have any leaves, snow, or ice cleared regularly. Use sand or salt on walking paths during winter. Clean up any spills in your garage right away. This includes oil or grease spills. What can I do in the bathroom? Use night lights. Install grab bars by the toilet and in the tub and shower. Do not use towel bars as grab bars. Use non-skid mats or decals in the tub or shower. If you need to sit down in the shower, use a plastic, non-slip stool. Keep the floor dry. Clean up any water that spills on the floor as soon as it happens. Remove soap buildup in the tub or shower regularly. Attach bath mats  securely with double-sided non-slip rug tape. Do not have throw rugs and other things on the floor that can make you trip. What can I do in the bedroom? Use night lights. Make sure that you have a light by your bed that is easy to reach. Do not use any sheets or blankets that are too big for your bed. They should not hang down onto the floor. Have a firm chair that has side arms. You can use this for support while you get dressed. Do not have throw rugs and other things on the floor that can make you trip. What can I do in the kitchen? Clean up any spills right away. Avoid walking on wet floors. Keep items that you use a lot in easy-to-reach places. If you need to reach something above you, use a strong step stool that has a grab bar. Keep electrical cords out of the way. Do not use floor polish or wax that makes floors slippery. If you must use wax, use non-skid floor wax. Do not have throw rugs and other things on the floor that can make you trip. What can I do with my stairs? Do  not leave any items on the stairs. Make sure that there are handrails on both sides of the stairs and use them. Fix handrails that are broken or loose. Make sure that handrails are as long as the stairways. Check any carpeting to make sure that it is firmly attached to the stairs. Fix any carpet that is loose or worn. Avoid having throw rugs at the top or bottom of the stairs. If you do have throw rugs, attach them to the floor with carpet tape. Make sure that you have a light switch at the top of the stairs and the bottom of the stairs. If you do not have them, ask someone to add them for you. What else can I do to help prevent falls? Wear shoes that: Do not have high heels. Have rubber bottoms. Are comfortable and fit you well. Are closed at the toe. Do not wear sandals. If you use a stepladder: Make sure that it is fully opened. Do not climb a closed stepladder. Make sure that both sides of the stepladder  are locked into place. Ask someone to hold it for you, if possible. Clearly mark and make sure that you can see: Any grab bars or handrails. First and last steps. Where the edge of each step is. Use tools that help you move around (mobility aids) if they are needed. These include: Canes. Walkers. Scooters. Crutches. Turn on the lights when you go into a dark area. Replace any light bulbs as soon as they burn out. Set up your furniture so you have a clear path. Avoid moving your furniture around. If any of your floors are uneven, fix them. If there are any pets around you, be aware of where they are. Review your medicines with your doctor. Some medicines can make you feel dizzy. This can increase your chance of falling. Ask your doctor what other things that you can do to help prevent falls. This information is not intended to replace advice given to you by your health care provider. Make sure you discuss any questions you have with your health care provider. Document Released: 01/30/2009 Document Revised: 09/11/2015 Document Reviewed: 05/10/2014 Elsevier Interactive Patient Education  2017 [employer].

## 2022-09-22 DIAGNOSIS — L404 Guttate psoriasis: Secondary | ICD-10-CM | POA: Diagnosis not present

## 2022-10-08 ENCOUNTER — Emergency Department (HOSPITAL_COMMUNITY): Payer: PPO

## 2022-10-08 ENCOUNTER — Inpatient Hospital Stay (HOSPITAL_COMMUNITY)
Admission: EM | Admit: 2022-10-08 | Discharge: 2022-10-25 | DRG: 689 | Disposition: A | Discharge status: Skilled Nursing Facility | Payer: PPO | Attending: Internal Medicine | Admitting: Internal Medicine

## 2022-10-08 ENCOUNTER — Encounter (HOSPITAL_COMMUNITY): Payer: Self-pay

## 2022-10-08 ENCOUNTER — Other Ambulatory Visit: Payer: Self-pay

## 2022-10-08 DIAGNOSIS — F0280 Dementia in other diseases classified elsewhere without behavioral disturbance: Secondary | ICD-10-CM | POA: Diagnosis not present

## 2022-10-08 DIAGNOSIS — F03C18 Unspecified dementia, severe, with other behavioral disturbance: Secondary | ICD-10-CM | POA: Diagnosis present

## 2022-10-08 DIAGNOSIS — J690 Pneumonitis due to inhalation of food and vomit: Secondary | ICD-10-CM | POA: Diagnosis not present

## 2022-10-08 DIAGNOSIS — E876 Hypokalemia: Secondary | ICD-10-CM | POA: Diagnosis not present

## 2022-10-08 DIAGNOSIS — R739 Hyperglycemia, unspecified: Secondary | ICD-10-CM | POA: Diagnosis present

## 2022-10-08 DIAGNOSIS — K5900 Constipation, unspecified: Secondary | ICD-10-CM | POA: Diagnosis present

## 2022-10-08 DIAGNOSIS — L539 Erythematous condition, unspecified: Secondary | ICD-10-CM | POA: Diagnosis present

## 2022-10-08 DIAGNOSIS — R4182 Altered mental status, unspecified: Secondary | ICD-10-CM

## 2022-10-08 DIAGNOSIS — R531 Weakness: Secondary | ICD-10-CM | POA: Diagnosis not present

## 2022-10-08 DIAGNOSIS — N390 Urinary tract infection, site not specified: Secondary | ICD-10-CM | POA: Diagnosis not present

## 2022-10-08 DIAGNOSIS — K8020 Calculus of gallbladder without cholecystitis without obstruction: Secondary | ICD-10-CM | POA: Diagnosis not present

## 2022-10-08 DIAGNOSIS — Z751 Person awaiting admission to adequate facility elsewhere: Secondary | ICD-10-CM

## 2022-10-08 DIAGNOSIS — R000 Tachycardia, unspecified: Secondary | ICD-10-CM | POA: Diagnosis not present

## 2022-10-08 DIAGNOSIS — E162 Hypoglycemia, unspecified: Secondary | ICD-10-CM | POA: Diagnosis not present

## 2022-10-08 DIAGNOSIS — E785 Hyperlipidemia, unspecified: Secondary | ICD-10-CM | POA: Diagnosis present

## 2022-10-08 DIAGNOSIS — T424X5A Adverse effect of benzodiazepines, initial encounter: Secondary | ICD-10-CM | POA: Diagnosis present

## 2022-10-08 DIAGNOSIS — R197 Diarrhea, unspecified: Secondary | ICD-10-CM | POA: Diagnosis not present

## 2022-10-08 DIAGNOSIS — B961 Klebsiella pneumoniae [K. pneumoniae] as the cause of diseases classified elsewhere: Secondary | ICD-10-CM | POA: Diagnosis present

## 2022-10-08 DIAGNOSIS — R5381 Other malaise: Secondary | ICD-10-CM | POA: Diagnosis present

## 2022-10-08 DIAGNOSIS — D696 Thrombocytopenia, unspecified: Secondary | ICD-10-CM | POA: Diagnosis present

## 2022-10-08 DIAGNOSIS — N3001 Acute cystitis with hematuria: Secondary | ICD-10-CM | POA: Diagnosis not present

## 2022-10-08 DIAGNOSIS — G928 Other toxic encephalopathy: Secondary | ICD-10-CM | POA: Diagnosis present

## 2022-10-08 DIAGNOSIS — R1930 Abdominal rigidity, unspecified site: Secondary | ICD-10-CM | POA: Diagnosis not present

## 2022-10-08 DIAGNOSIS — G9341 Metabolic encephalopathy: Secondary | ICD-10-CM | POA: Diagnosis present

## 2022-10-08 DIAGNOSIS — Z8249 Family history of ischemic heart disease and other diseases of the circulatory system: Secondary | ICD-10-CM

## 2022-10-08 DIAGNOSIS — R109 Unspecified abdominal pain: Secondary | ICD-10-CM | POA: Diagnosis not present

## 2022-10-08 DIAGNOSIS — E860 Dehydration: Secondary | ICD-10-CM | POA: Diagnosis not present

## 2022-10-08 DIAGNOSIS — R9389 Abnormal findings on diagnostic imaging of other specified body structures: Secondary | ICD-10-CM | POA: Diagnosis not present

## 2022-10-08 DIAGNOSIS — N179 Acute kidney failure, unspecified: Secondary | ICD-10-CM

## 2022-10-08 DIAGNOSIS — B9620 Unspecified Escherichia coli [E. coli] as the cause of diseases classified elsewhere: Secondary | ICD-10-CM | POA: Diagnosis present

## 2022-10-08 DIAGNOSIS — R0989 Other specified symptoms and signs involving the circulatory and respiratory systems: Secondary | ICD-10-CM | POA: Diagnosis not present

## 2022-10-08 DIAGNOSIS — I700 Atherosclerosis of aorta: Secondary | ICD-10-CM | POA: Diagnosis not present

## 2022-10-08 LAB — CBC WITH DIFFERENTIAL/PLATELET
Abs Immature Granulocytes: 0.06 10*3/uL (ref 0.00–0.07)
Basophils Absolute: 0 10*3/uL (ref 0.0–0.1)
Basophils Relative: 0 %
Eosinophils Absolute: 0 10*3/uL (ref 0.0–0.5)
Eosinophils Relative: 0 %
HCT: 42.3 % (ref 36.0–46.0)
Hemoglobin: 14 g/dL (ref 12.0–15.0)
Immature Granulocytes: 1 %
Lymphocytes Relative: 8 %
Lymphs Abs: 0.7 10*3/uL (ref 0.7–4.0)
MCH: 28.8 pg (ref 26.0–34.0)
MCHC: 33.1 g/dL (ref 30.0–36.0)
MCV: 87 fL (ref 80.0–100.0)
Monocytes Absolute: 0.8 10*3/uL (ref 0.1–1.0)
Monocytes Relative: 9 %
Neutro Abs: 7.4 10*3/uL (ref 1.7–7.7)
Neutrophils Relative %: 82 %
Platelets: 165 10*3/uL (ref 150–400)
RBC: 4.86 MIL/uL (ref 3.87–5.11)
RDW: 12.5 % (ref 11.5–15.5)
WBC: 9 10*3/uL (ref 4.0–10.5)
nRBC: 0 % (ref 0.0–0.2)

## 2022-10-08 LAB — URINALYSIS, ROUTINE W REFLEX MICROSCOPIC
Bilirubin Urine: NEGATIVE
Glucose, UA: NEGATIVE mg/dL
Ketones, ur: 5 mg/dL — AB
Nitrite: POSITIVE — AB
Protein, ur: NEGATIVE mg/dL
Specific Gravity, Urine: 1.024 (ref 1.005–1.030)
WBC, UA: >50 WBC/hpf (ref 0–5)
pH: 5 (ref 5.0–8.0)

## 2022-10-08 LAB — COMPREHENSIVE METABOLIC PANEL
ALT: 22 U/L (ref 0–44)
AST: 33 U/L (ref 15–41)
Albumin: 3.8 g/dL (ref 3.5–5.0)
Alkaline Phosphatase: 52 U/L (ref 38–126)
Anion gap: 12 (ref 5–15)
BUN: 22 mg/dL (ref 8–23)
CO2: 25 mmol/L (ref 22–32)
Calcium: 9.3 mg/dL (ref 8.9–10.3)
Chloride: 101 mmol/L (ref 98–111)
Creatinine, Ser: 1.05 mg/dL — ABNORMAL HIGH (ref 0.44–1.00)
GFR, Estimated: 55 mL/min — ABNORMAL LOW (ref >60)
Glucose, Bld: 177 mg/dL — ABNORMAL HIGH (ref 70–99)
Potassium: 4.5 mmol/L (ref 3.5–5.1)
Sodium: 138 mmol/L (ref 135–145)
Total Bilirubin: 1.7 mg/dL — ABNORMAL HIGH (ref 0.3–1.2)
Total Protein: 8.2 g/dL — ABNORMAL HIGH (ref 6.5–8.1)

## 2022-10-08 LAB — CBG MONITORING, ED: Glucose-Capillary: 100 mg/dL — ABNORMAL HIGH (ref 70–99)

## 2022-10-08 LAB — HEMOGLOBIN A1C
Hgb A1c MFr Bld: 5.3 % (ref 4.8–5.6)
Mean Plasma Glucose: 105.41 mg/dL

## 2022-10-08 MED ORDER — SODIUM CHLORIDE 0.9 % IV BOLUS
1000.0000 mL | Freq: Once | INTRAVENOUS | Status: AC
  Administered 2022-10-08: 1000 mL via INTRAVENOUS

## 2022-10-08 MED ORDER — INSULIN ASPART 100 UNIT/ML IJ SOLN
0.0000 [IU] | Freq: Three times a day (TID) | INTRAMUSCULAR | Status: DC
  Administered 2022-10-12: 2 [IU] via SUBCUTANEOUS

## 2022-10-08 MED ORDER — SODIUM CHLORIDE 0.9% FLUSH
3.0000 mL | Freq: Two times a day (BID) | INTRAVENOUS | Status: DC
  Administered 2022-10-09 – 2022-10-25 (×30): 3 mL via INTRAVENOUS

## 2022-10-08 MED ORDER — SODIUM CHLORIDE 0.9 % IV SOLN
2.0000 g | INTRAVENOUS | Status: AC
  Administered 2022-10-09: 2 g via INTRAVENOUS
  Filled 2022-10-08: qty 20

## 2022-10-08 MED ORDER — LORAZEPAM 2 MG/ML IJ SOLN
1.0000 mg | Freq: Once | INTRAMUSCULAR | Status: AC
  Administered 2022-10-08: 1 mg via INTRAVENOUS
  Filled 2022-10-08: qty 1

## 2022-10-08 MED ORDER — ENOXAPARIN SODIUM 40 MG/0.4ML IJ SOSY
40.0000 mg | PREFILLED_SYRINGE | INTRAMUSCULAR | Status: DC
  Administered 2022-10-09 – 2022-10-25 (×16): 40 mg via SUBCUTANEOUS
  Filled 2022-10-08 (×17): qty 0.4

## 2022-10-08 MED ORDER — INSULIN ASPART 100 UNIT/ML IJ SOLN: 0.0000 [IU] | Freq: Every day | INTRAMUSCULAR | Status: DC

## 2022-10-08 MED ORDER — SODIUM CHLORIDE 0.9 % IV SOLN: 2.0000 g | INTRAVENOUS | Status: DC

## 2022-10-08 MED ORDER — SODIUM CHLORIDE 0.9 % IV SOLN
2.0000 g | Freq: Once | INTRAVENOUS | Status: AC
  Administered 2022-10-08: 2 g via INTRAVENOUS
  Filled 2022-10-08: qty 20

## 2022-10-08 MED ORDER — LORAZEPAM 2 MG/ML IJ SOLN
0.5000 mg | Freq: Once | INTRAMUSCULAR | Status: AC
  Administered 2022-10-08: 0.5 mg via INTRAVENOUS
  Filled 2022-10-08: qty 1

## 2022-10-08 MED ORDER — ACETAMINOPHEN 325 MG PO TABS
650.0000 mg | ORAL_TABLET | ORAL | Status: DC
  Administered 2022-10-09 – 2022-10-10 (×7): 650 mg via ORAL
  Filled 2022-10-08 (×9): qty 2

## 2022-10-08 MED ORDER — POLYETHYLENE GLYCOL 3350 17 G PO PACK
17.0000 g | PACK | Freq: Two times a day (BID) | ORAL | Status: DC
  Administered 2022-10-09 – 2022-10-25 (×14): 17 g via ORAL
  Filled 2022-10-08 (×20): qty 1

## 2022-10-08 NOTE — ED Notes (Signed)
Per MD Maryjean Ka, request to monitor for increased redness on face and other signs of allergic reaction. Pt in NAD at this time.

## 2022-10-08 NOTE — ED Notes (Signed)
Redness noted to pts R cheek and bridge of nose after stopping abx. Husband states this is new. MD Maryjean Ka made aware.

## 2022-10-08 NOTE — Assessment & Plan Note (Signed)
Culture is pending, we will treat with ceftriaxone.  Patient still seems to be uncomfortable/slightly painful.  I will give her pain medication standing.

## 2022-10-08 NOTE — ED Notes (Signed)
ED TO INPATIENT HANDOFF REPORT  ED Nurse Name and Phone #: Morrie Sheldon RN [phone]  S Name/Age/Gender Amanda [name] 77 y.o. female Room/Bed: 004C/004C  Code Status   Code Status: Full Code  Home/SNF/Other Home Disoriented x4 Is this baseline? No   Triage Complete: Triage complete  Chief Complaint UTI (urinary tract infection) [N39.0]  Triage Note Patient arrives by EMS from home with c/o no BM x 3 days per husband.  Patient has history of dementia.     Allergies No Known Allergies  Level of Care/Admitting Diagnosis ED Disposition     ED Disposition  Admit   Condition  --   Comment  Hospital Area: [facility] [[id number]]  Level of Care: Med-Surg [16]  May place patient in observation at [facility] or [facility] if equivalent level of care is available:: No  Covid Evaluation: Asymptomatic - no recent exposure (last 10 days) testing not required  Diagnosis: UTI (urinary tract infection) [387564]  Admitting Physician: Nolberto Hanlon [[id number]]  Attending Physician: Nolberto Hanlon [[id number]]          B Medical/Surgery History Past Medical History:  Diagnosis Date   Dementia (HCC)    Dyslipidemia    History reviewed. No pertinent surgical history.   A IV Location/Drains/Wounds Patient Lines/Drains/Airways Status     Active Line/Drains/Airways     Name Placement date Placement time Site Days   Peripheral IV 10/08/22 20 G Anterior;Right Wrist 10/08/22  1804  Wrist  less than 1            Intake/Output Last 24 hours No intake or output data in the 24 hours ending 10/08/22 2315  Labs/Imaging Results for orders placed or performed during the hospital encounter of 10/08/22 (from the past 48 hour(s))  CBC with Differential     Status: None   Collection Time: 10/08/22  5:05 PM  Result Value Ref Range   WBC 9.0 4.0 - 10.5 K/uL    Comment: WHITE COUNT CONFIRMED ON SMEAR   RBC 4.86 3.87 - 5.11 MIL/uL   Hemoglobin 14.0 12.0 - 15.0 g/dL   HCT  30.1 60.1 - 09.3 %   MCV 87.0 80.0 - 100.0 fL   MCH 28.8 26.0 - 34.0 pg   MCHC 33.1 30.0 - 36.0 g/dL   RDW 23.5 57.3 - 22.0 %   Platelets 165 150 - 400 K/uL    Comment: REPEATED TO VERIFY PLATELET COUNT CONFIRMED BY SMEAR    nRBC 0.0 0.0 - 0.2 %   Neutrophils Relative % 82 %   Neutro Abs 7.4 1.7 - 7.7 K/uL   Lymphocytes Relative 8 %   Lymphs Abs 0.7 0.7 - 4.0 K/uL   Monocytes Relative 9 %   Monocytes Absolute 0.8 0.1 - 1.0 K/uL   Eosinophils Relative 0 %   Eosinophils Absolute 0.0 0.0 - 0.5 K/uL   Basophils Relative 0 %   Basophils Absolute 0.0 0.0 - 0.1 K/uL   Immature Granulocytes 1 %   Abs Immature Granulocytes 0.06 0.00 - 0.07 K/uL    Comment: Performed at [facility], [address], [place], [place] [address]  Comprehensive metabolic panel     Status: Abnormal   Collection Time: 10/08/22  5:05 PM  Result Value Ref Range   Sodium 138 135 - 145 mmol/L   Potassium 4.5 3.5 - 5.1 mmol/L    Comment: HEMOLYSIS AT THIS LEVEL MAY AFFECT RESULT   Chloride 101 98 - 111 mmol/L   CO2  25 22 - 32 mmol/L   Glucose, Bld 177 (H) 70 - 99 mg/dL    Comment: Glucose reference range applies only to samples taken after fasting for at least 8 hours.   BUN 22 8 - 23 mg/dL   Creatinine, Ser 6.21 (H) 0.44 - 1.00 mg/dL   Calcium 9.3 8.9 - 30.8 mg/dL   Total Protein 8.2 (H) 6.5 - 8.1 g/dL   Albumin 3.8 3.5 - 5.0 g/dL   AST 33 15 - 41 U/L    Comment: HEMOLYSIS AT THIS LEVEL MAY AFFECT RESULT   ALT 22 0 - 44 U/L    Comment: HEMOLYSIS AT THIS LEVEL MAY AFFECT RESULT   Alkaline Phosphatase 52 38 - 126 U/L   Total Bilirubin 1.7 (H) 0.3 - 1.2 mg/dL    Comment: HEMOLYSIS AT THIS LEVEL MAY AFFECT RESULT   GFR, Estimated 55 (L) >60 mL/min    Comment: (NOTE) Calculated using the CKD-EPI Creatinine Equation (2021)    Anion gap 12 5 - 15    Comment: Performed at [facility], [address] [place], [place] [address]  Hemoglobin A1c     Status: None   Collection Time: 10/08/22  5:05 PM   Result Value Ref Range   Hgb A1c MFr Bld 5.3 4.8 - 5.6 %    Comment: (NOTE) Pre diabetes:          5.7%-6.4%  Diabetes:              >6.4%  Glycemic control for   <7.0% adults with diabetes    Mean Plasma Glucose 105.41 mg/dL    Comment: Performed at [facility], [name], [place], [place] [address]  Urinalysis, Routine w reflex microscopic -Urine, Clean Catch     Status: Abnormal   Collection Time: 10/08/22  8:53 PM  Result Value Ref Range   Color, Urine AMBER (A) YELLOW    Comment: BIOCHEMICALS MAY BE AFFECTED BY COLOR   APPearance HAZY (A) CLEAR   Specific Gravity, Urine 1.024 1.005 - 1.030   pH 5.0 5.0 - 8.0   Glucose, UA NEGATIVE NEGATIVE mg/dL   Hgb urine dipstick MODERATE (A) NEGATIVE   Bilirubin Urine NEGATIVE NEGATIVE   Ketones, ur 5 (A) NEGATIVE mg/dL   Protein, ur NEGATIVE NEGATIVE mg/dL   Nitrite POSITIVE (A) NEGATIVE   Leukocytes,Ua LARGE (A) NEGATIVE   RBC / HPF 0-5 0 - 5 RBC/hpf   WBC, UA >50 0 - 5 WBC/hpf   Bacteria, UA MANY (A) NONE SEEN   Squamous Epithelial / HPF 0-5 0 - 5 /HPF   Mucus PRESENT     Comment: Performed at [facility], [address], [place], [place] [address]  CBG monitoring, ED     Status: Abnormal   Collection Time: 10/08/22 10:37 PM  Result Value Ref Range   Glucose-Capillary 100 (H) 70 - 99 mg/dL    Comment: Glucose reference range applies only to samples taken after fasting for at least 8 hours.   DG Pelvis Portable  Result Date: 10/08/2022 CLINICAL DATA:  weak EXAM: PORTABLE PELVIS 1-2 VIEWS COMPARISON:  None Available. FINDINGS: There is no evidence of pelvic fracture or diastasis. No acute displaced fracture or dislocation of either hips on frontal view. No pelvic bone lesions are seen. IMPRESSION: Negative. Electronically Signed   By: Tish Frederickson M.D.   On: 10/08/2022 21:01   DG Chest Port 1 View  Result Date: 10/08/2022 CLINICAL DATA:  Weakness.  EXAM: PORTABLE CHEST 1 VIEW COMPARISON:  03/26/2017  FINDINGS: Heart size and mediastinal contours are unremarkable. Aortic atherosclerotic calcifications identified. Decreased lung volumes with slight asymmetric elevation of the right hemidiaphragm. No pleural fluid, interstitial edema or airspace disease. No acute osseous findings. IMPRESSION: Low lung volumes. No acute findings. Electronically Signed   By: Signa Kell M.D.   On: 10/08/2022 18:10   CT ABDOMEN PELVIS WO CONTRAST  Result Date: 10/08/2022 CLINICAL DATA:  Constipation for 3 days, abdominal pain, dementia EXAM: CT ABDOMEN AND PELVIS WITHOUT CONTRAST TECHNIQUE: Multidetector CT imaging of the abdomen and pelvis was performed following the standard protocol without IV contrast. RADIATION DOSE REDUCTION: This exam was performed according to the departmental dose-optimization program which includes automated exposure control, adjustment of the mA and/or kV according to patient size and/or use of iterative reconstruction technique. COMPARISON:  09/07/2022 FINDINGS: Lower chest: Dependent hypoventilatory changes. No acute pleural or parenchymal lung disease. Hepatobiliary: Calcified gallstones without cholecystitis. Unremarkable unenhanced appearance of the liver. Pancreas: Unremarkable unenhanced appearance. Spleen: Unremarkable unenhanced appearance. Adrenals/Urinary Tract: No urinary tract calculi or obstructive uropathy. The adrenals are stable. Bladder is unremarkable. Stomach/Bowel: No bowel obstruction or ileus. Moderate retained stool within the rectal vault without evidence of stercoral colitis. No bowel wall thickening or inflammatory change. Vascular/Lymphatic: Aortic atherosclerosis. No enlarged abdominal or pelvic lymph nodes. Reproductive: Status post hysterectomy. No adnexal masses. Other: No free fluid or free intraperitoneal gas. No abdominal wall hernia. Musculoskeletal: No acute or destructive bony abnormalities. Reconstructed images demonstrate no additional findings. IMPRESSION: 1.  Cholelithiasis without cholecystitis. 2. Moderate retained stool within the rectal vault which could reflect fecal impaction. No evidence of stercoral colitis. 3.  Aortic Atherosclerosis (ICD10-I70.0). Electronically Signed   By: Sharlet Salina M.D.   On: 10/08/2022 18:07   CT Head Wo Contrast  Result Date: 10/08/2022 CLINICAL DATA:  History of dementia with mental status change of unknown etiology. EXAM: CT HEAD WITHOUT CONTRAST TECHNIQUE: Contiguous axial images were obtained from the base of the skull through the vertex without intravenous contrast. RADIATION DOSE REDUCTION: This exam was performed according to the departmental dose-optimization program which includes automated exposure control, adjustment of the mA and/or kV according to patient size and/or use of iterative reconstruction technique. COMPARISON:  05/22/2021 FINDINGS: Brain: No evidence of acute infarction, hemorrhage, hydrocephalus, extra-axial collection or mass lesion/mass effect. There is mild diffuse low-attenuation within the subcortical and periventricular white matter compatible with chronic microvascular disease. Prominence of the sulci and ventricles. Vascular: No hyperdense vessel or unexpected calcification. Skull: Normal. Negative for fracture or focal lesion. Sinuses/Orbits: No acute finding. Other: No scalp hematoma IMPRESSION: 1. No acute intracranial abnormalities. 2. Chronic microvascular disease and brain atrophy. Electronically Signed   By: Signa Kell M.D.   On: 10/08/2022 18:06    Pending Labs Unresulted Labs (From admission, onward)     Start     Ordered   10/08/22 2230  Sodium, urine, random  Add-on,   AD        10/08/22 2229   10/08/22 2230  Creatinine, urine, random  Add-on,   AD        10/08/22 2229   10/08/22 2138  Urine Culture  Once,   URGENT       Question:  Indication  Answer:  Dysuria   10/08/22 2138   Signed and Held  CBC  (enoxaparin (LOVENOX)    CrCl >/= 30 ml/min)  Once,   R       Comments:  Baseline for enoxaparin therapy IF NOT ALREADY DRAWN.  Notify MD if PLT < 100 K.    Signed and Held   Signed and Held  Creatinine, serum  (enoxaparin (LOVENOX)    CrCl >/= 30 ml/min)  Once,   R       Comments: Baseline for enoxaparin therapy IF NOT ALREADY DRAWN.    Signed and Held   Signed and Held  Creatinine, serum  (enoxaparin (LOVENOX)    CrCl >/= 30 ml/min)  Weekly,   R     Comments: while on enoxaparin therapy    Signed and Held   Signed and Held  Protime-INR  Tomorrow morning,   R        Signed and Held   Signed and Held  APTT  Tomorrow morning,   R        Signed and Medical illustrator and Armed forces training and education officer morning,   R        Signed and Held   Signed and Held  CBC  Tomorrow morning,   R        Signed and Held            Vitals/Pain Today's Vitals   10/08/22 1623 10/08/22 1645 10/08/22 2047 10/08/22 2134  BP: (!) 140/56 134/68 128/67   Pulse: 87 86 96   Resp: 18 18 17    Temp: 98 F (36.7 C)  97.6 F (36.4 C)   TempSrc: Oral  Oral   SpO2:  99% 98%   Weight:    62.6 kg  Height:    5\' 5"  (1.651 m)    Isolation Precautions No active isolations  Medications Medications  insulin aspart (novoLOG) injection 0-15 Units (has no administration in time range)  insulin aspart (novoLOG) injection 0-5 Units ( Subcutaneous Not Given 10/08/22 2241)  cefTRIAXone (ROCEPHIN) 2 g in sodium chloride 0.9 % 100 mL IVPB (2 g Intravenous Not Given 10/08/22 2301)  polyethylene glycol (MIRALAX / GLYCOLAX) packet 17 g (17 g Oral Not Given 10/08/22 2259)  acetaminophen (TYLENOL) tablet 650 mg (650 mg Oral Not Given 10/08/22 2258)  sodium chloride 0.9 % bolus 1,000 mL (0 mLs Intravenous Stopped 10/08/22 1857)  sodium chloride 0.9 % bolus 1,000 mL (0 mLs Intravenous Stopped 10/08/22 2241)  LORazepam (ATIVAN) injection 0.5 mg (0.5 mg Intravenous Given 10/08/22 2044)  LORazepam (ATIVAN) injection 1 mg (1 mg Intravenous Given 10/08/22 2120)  cefTRIAXone (ROCEPHIN) 2 g in sodium  chloride 0.9 % 100 mL IVPB (0 g Intravenous Stopped 10/08/22 2231)    Mobility non-ambulatory     Focused Assessments Neuro Assessment Handoff:           Neuro Assessment:   Neuro Checks:         R Recommendations: See Admitting Provider Note  Report given to:   Additional Notes: Hx of dementia, husband reports pt is more altered than normal. Reports no bowel movement since Tuesday. Unable to take PO meds at this time due to not being able to follow commands

## 2022-10-08 NOTE — Assessment & Plan Note (Addendum)
I believe patient's alteration in mental status is due to her receiving of benzodiazepines.  Per history from the patient's husband at the bedside, patient was talking appropriately, and merely refusing to get out of bed, which she ascribes to her lower abdominal pain.  I really doubt that there is a CNS lesion going on that would present in this manner.  Therefore considering that the patient has not tolerated the MRI thus far, her CAT scan head is negative, I favor continued clinical monitoring and reconsideration of MRI in the morning if symptoms are persistent.  Per report patient was able to be stood up in the ER, and no focal deficit has been identified thus far.  Including on my exam.

## 2022-10-08 NOTE — Assessment & Plan Note (Signed)
Patient has a slight AKI.  With slight hyperglycemia.  Most consistent with infection related metabolic abnormalities.  Insulin trend tonight, patient has received 2 L of fluid in the ER, check urine sodium and creatinine as an add-on.  Monitor response of creatinine to above interventions.

## 2022-10-08 NOTE — ED Provider Notes (Signed)
[facility] Provider Note   CSN: [id number] Arrival date & time: 10/08/22  1614     History {Add pertinent medical, surgical, social history, OB history to HPI:1} Chief Complaint  Patient presents with   Constipation    Amanda [name] is a 77 y.o. female.  Patient has severe dementia.  Her husband states that she was complaining of abdominal pain and did not want to walk and she was more confused than normal.   Abdominal Pain      Home Medications Prior to Admission medications   Medication Sig Start Date End Date Taking? Authorizing Provider  triamcinolone cream (KENALOG) 0.1 % Apply topically 2 (two) times daily as needed. 08/19/22   [provider]      Allergies    Patient has no known allergies.    Review of Systems   Review of Systems  Gastrointestinal:  Positive for abdominal pain.    Physical Exam Updated Vital Signs BP 128/67   Pulse 96   Temp 97.6 F (36.4 C) (Oral)   Resp 17   Ht 5\' 5"  (1.651 m)   Wt 62.6 kg   SpO2 98%   BMI 22.97 kg/m  Physical Exam  ED Results / Procedures / Treatments   Labs (all labs ordered are listed, but only abnormal results are displayed) Labs Reviewed  COMPREHENSIVE METABOLIC PANEL - Abnormal; Notable for the following components:      Result Value   Glucose, Bld 177 (*)    Creatinine, Ser 1.05 (*)    Total Protein 8.2 (*)    Total Bilirubin 1.7 (*)    GFR, Estimated 55 (*)    All other components within normal limits  URINALYSIS, ROUTINE W REFLEX MICROSCOPIC - Abnormal; Notable for the following components:   Color, Urine AMBER (*)    APPearance HAZY (*)    Hgb urine dipstick MODERATE (*)    Ketones, ur 5 (*)    Nitrite POSITIVE (*)    Leukocytes,Ua LARGE (*)    Bacteria, UA MANY (*)    All other components within normal limits  URINE CULTURE  CBC WITH DIFFERENTIAL/PLATELET    EKG None  Radiology DG Pelvis Portable  Result Date:  10/08/2022 CLINICAL DATA:  weak EXAM: PORTABLE PELVIS 1-2 VIEWS COMPARISON:  None Available. FINDINGS: There is no evidence of pelvic fracture or diastasis. No acute displaced fracture or dislocation of either hips on frontal view. No pelvic bone lesions are seen. IMPRESSION: Negative. Electronically Signed   By: Tish Frederickson M.D.   On: 10/08/2022 21:01   DG Chest Port 1 View  Result Date: 10/08/2022 CLINICAL DATA:  Weakness. EXAM: PORTABLE CHEST 1 VIEW COMPARISON:  03/26/2017 FINDINGS: Heart size and mediastinal contours are unremarkable. Aortic atherosclerotic calcifications identified. Decreased lung volumes with slight asymmetric elevation of the right hemidiaphragm. No pleural fluid, interstitial edema or airspace disease. No acute osseous findings. IMPRESSION: Low lung volumes. No acute findings. Electronically Signed   By: Signa Kell M.D.   On: 10/08/2022 18:10   CT ABDOMEN PELVIS WO CONTRAST  Result Date: 10/08/2022 CLINICAL DATA:  Constipation for 3 days, abdominal pain, dementia EXAM: CT ABDOMEN AND PELVIS WITHOUT CONTRAST TECHNIQUE: Multidetector CT imaging of the abdomen and pelvis was performed following the standard protocol without IV contrast. RADIATION DOSE REDUCTION: This exam was performed according to the departmental dose-optimization program which includes automated exposure control, adjustment of the mA and/or kV according to patient size and/or use of  iterative reconstruction technique. COMPARISON:  09/07/2022 FINDINGS: Lower chest: Dependent hypoventilatory changes. No acute pleural or parenchymal lung disease. Hepatobiliary: Calcified gallstones without cholecystitis. Unremarkable unenhanced appearance of the liver. Pancreas: Unremarkable unenhanced appearance. Spleen: Unremarkable unenhanced appearance. Adrenals/Urinary Tract: No urinary tract calculi or obstructive uropathy. The adrenals are stable. Bladder is unremarkable. Stomach/Bowel: No bowel obstruction or ileus.  Moderate retained stool within the rectal vault without evidence of stercoral colitis. No bowel wall thickening or inflammatory change. Vascular/Lymphatic: Aortic atherosclerosis. No enlarged abdominal or pelvic lymph nodes. Reproductive: Status post hysterectomy. No adnexal masses. Other: No free fluid or free intraperitoneal gas. No abdominal wall hernia. Musculoskeletal: No acute or destructive bony abnormalities. Reconstructed images demonstrate no additional findings. IMPRESSION: 1. Cholelithiasis without cholecystitis. 2. Moderate retained stool within the rectal vault which could reflect fecal impaction. No evidence of stercoral colitis. 3.  Aortic Atherosclerosis (ICD10-I70.0). Electronically Signed   By: Sharlet Salina M.D.   On: 10/08/2022 18:07   CT Head Wo Contrast  Result Date: 10/08/2022 CLINICAL DATA:  History of dementia with mental status change of unknown etiology. EXAM: CT HEAD WITHOUT CONTRAST TECHNIQUE: Contiguous axial images were obtained from the base of the skull through the vertex without intravenous contrast. RADIATION DOSE REDUCTION: This exam was performed according to the departmental dose-optimization program which includes automated exposure control, adjustment of the mA and/or kV according to patient size and/or use of iterative reconstruction technique. COMPARISON:  05/22/2021 FINDINGS: Brain: No evidence of acute infarction, hemorrhage, hydrocephalus, extra-axial collection or mass lesion/mass effect. There is mild diffuse low-attenuation within the subcortical and periventricular white matter compatible with chronic microvascular disease. Prominence of the sulci and ventricles. Vascular: No hyperdense vessel or unexpected calcification. Skull: Normal. Negative for fracture or focal lesion. Sinuses/Orbits: No acute finding. Other: No scalp hematoma IMPRESSION: 1. No acute intracranial abnormalities. 2. Chronic microvascular disease and brain atrophy. Electronically Signed   By:  Signa Kell M.D.   On: 10/08/2022 18:06    Procedures Procedures  {Document cardiac monitor, telemetry assessment procedure when appropriate:1}  Medications Ordered in ED Medications  sodium chloride 0.9 % bolus 1,000 mL (0 mLs Intravenous Stopped 10/08/22 1857)  sodium chloride 0.9 % bolus 1,000 mL (1,000 mLs Intravenous New Bag/Given 10/08/22 2020)  LORazepam (ATIVAN) injection 0.5 mg (0.5 mg Intravenous Given 10/08/22 2044)  LORazepam (ATIVAN) injection 1 mg (1 mg Intravenous Given 10/08/22 2120)  cefTRIAXone (ROCEPHIN) 2 g in sodium chloride 0.9 % 100 mL IVPB (2 g Intravenous New Bag/Given 10/08/22 2153)    ED Course/ Medical Decision Making/ A&P   {   Click here for ABCD2, HEART and other calculatorsREFRESH Note before signing :1}                          Medical Decision Making Amount and/or Complexity of Data Reviewed Labs: ordered. Radiology: ordered. ECG/medicine tests: ordered.  Risk Prescription drug management.   Patient with urinary tract infection and constipation and worsening dementia.  She will be admitted to medicine  {Document critical care time when appropriate:1} {Document review of labs and clinical decision tools ie heart score, Chads2Vasc2 etc:1}  {Document your independent review of radiology images, and any outside records:1} {Document your discussion with family members, caretakers, and with consultants:1} {Document social determinants of health affecting pt's care:1} {Document your decision making why or why not admission, treatments were needed:1} Final Clinical Impression(s) / ED Diagnoses Final diagnoses:  Acute cystitis with hematuria    Rx /  DC Orders ED Discharge Orders     None       

## 2022-10-08 NOTE — ED Triage Notes (Signed)
Patient arrives by EMS from home with c/o no BM x 3 days per husband.  Patient has history of dementia.

## 2022-10-08 NOTE — ED Notes (Signed)
Pt unable to sit still after additional dose of ativan. MD Zammit made aware.

## 2022-10-08 NOTE — H&P (Signed)
History and Physical    Patient: Amanda [name] FIE:[id number] DOB: 06/15/1945 DOA: 10/08/2022 DOS: the patient was seen and examined on 10/08/2022 PCP: [name], MD  Patient coming from: Home  Chief Complaint:  Chief Complaint  Patient presents with   Constipation   HPI: Amanda [name] is a 77 y.o. female with medical history significant of moderate to severe dementia.  Therefore history is obtained primarily from patient's husband.  In spite of her dementia, patient is directable able to get up and participate in activities of daily living such as eating or drinking.  Although she has to be supervised.  Patient was in her usual state of health last evening when she went to bed.  On getting up this morning, patient was complaining of lower abdominal discomfort/pain.  And on attempted to be gotten up, asked that she not be bothered because of her pain.  There is no report of patient falling having any trauma any focal weakness.  Per husband's report, patient was talking as she usually does, also refusing to get up and walk.  She stayed in her "day".  Patient finally called EMS and the patient was brought to [facility] ER.  History is obtained primarily from the patient's husband, record review and signout.  ER course is notable for patient having a CAT scan done, an MRI brain was attempted after administration of benzodiazepines.  However patient did not tolerate the MRI.  Patient has since then been increasing confused.  Medical evaluation is sought.  Review of Systems: unable to review all systems due to the inability of the patient to answer questions. Past Medical History:  Diagnosis Date   Dementia (HCC)    Dyslipidemia    History reviewed. No pertinent surgical history. Social History:  reports that she has never smoked. She has never used smokeless tobacco. She reports that she does not drink alcohol and does not use drugs.  No Known Allergies  Family History   Problem Relation Age of Onset   Dementia Mother    Heart disease Maternal Grandmother     Prior to Admission medications   Medication Sig Start Date End Date Taking? Authorizing Provider  triamcinolone cream (KENALOG) 0.1 % Apply topically 2 (two) times daily as needed. 08/19/22   [provider]    Physical Exam: Vitals:   10/08/22 1623 10/08/22 1645 10/08/22 2047 10/08/22 2134  BP: (!) 140/56 134/68 128/67   Pulse: 87 86 96   Resp: 18 18 17    Temp: 98 F (36.7 C)  97.6 F (36.4 C)   TempSrc: Oral  Oral   SpO2:  99% 98%   Weight:    62.6 kg  Height:    5\' 5"  (1.651 m)   General: Patient is confused, not able to speak coherently, although she is beginning to wake up.  Patient is not following directions, no obvious foot focal motor deficit is apparent Respiratory exam: Bilateral intravesicular Cardiovascular exam S1-S2 normal Abdomen soft nontender Extremities warm without edema, patient moves her extremities and there is no obvious focal motor deficit.  However patient does not follow directions, detailed neurologic exam is not possible. Data Reviewed:  Labs on Admission:  Results for orders placed or performed during the hospital encounter of 10/08/22 (from the past 24 hour(s))  CBC with Differential     Status: None   Collection Time: 10/08/22  5:05 PM  Result Value Ref Range   WBC 9.0 4.0 - 10.5 K/uL   RBC  4.86 3.87 - 5.11 MIL/uL   Hemoglobin 14.0 12.0 - 15.0 g/dL   HCT 29.5 62.1 - 30.8 %   MCV 87.0 80.0 - 100.0 fL   MCH 28.8 26.0 - 34.0 pg   MCHC 33.1 30.0 - 36.0 g/dL   RDW 65.7 84.6 - 96.2 %   Platelets 165 150 - 400 K/uL   nRBC 0.0 0.0 - 0.2 %   Neutrophils Relative % 82 %   Neutro Abs 7.4 1.7 - 7.7 K/uL   Lymphocytes Relative 8 %   Lymphs Abs 0.7 0.7 - 4.0 K/uL   Monocytes Relative 9 %   Monocytes Absolute 0.8 0.1 - 1.0 K/uL   Eosinophils Relative 0 %   Eosinophils Absolute 0.0 0.0 - 0.5 K/uL   Basophils Relative 0 %   Basophils Absolute 0.0 0.0  - 0.1 K/uL   Immature Granulocytes 1 %   Abs Immature Granulocytes 0.06 0.00 - 0.07 K/uL  Comprehensive metabolic panel     Status: Abnormal   Collection Time: 10/08/22  5:05 PM  Result Value Ref Range   Sodium 138 135 - 145 mmol/L   Potassium 4.5 3.5 - 5.1 mmol/L   Chloride 101 98 - 111 mmol/L   CO2 25 22 - 32 mmol/L   Glucose, Bld 177 (H) 70 - 99 mg/dL   BUN 22 8 - 23 mg/dL   Creatinine, Ser 9.52 (H) 0.44 - 1.00 mg/dL   Calcium 9.3 8.9 - 84.1 mg/dL   Total Protein 8.2 (H) 6.5 - 8.1 g/dL   Albumin 3.8 3.5 - 5.0 g/dL   AST 33 15 - 41 U/L   ALT 22 0 - 44 U/L   Alkaline Phosphatase 52 38 - 126 U/L   Total Bilirubin 1.7 (H) 0.3 - 1.2 mg/dL   GFR, Estimated 55 (L) >60 mL/min   Anion gap 12 5 - 15  Urinalysis, Routine w reflex microscopic -Urine, Clean Catch     Status: Abnormal   Collection Time: 10/08/22  8:53 PM  Result Value Ref Range   Color, Urine AMBER (A) YELLOW   APPearance HAZY (A) CLEAR   Specific Gravity, Urine 1.024 1.005 - 1.030   pH 5.0 5.0 - 8.0   Glucose, UA NEGATIVE NEGATIVE mg/dL   Hgb urine dipstick MODERATE (A) NEGATIVE   Bilirubin Urine NEGATIVE NEGATIVE   Ketones, ur 5 (A) NEGATIVE mg/dL   Protein, ur NEGATIVE NEGATIVE mg/dL   Nitrite POSITIVE (A) NEGATIVE   Leukocytes,Ua LARGE (A) NEGATIVE   RBC / HPF 0-5 0 - 5 RBC/hpf   WBC, UA >50 0 - 5 WBC/hpf   Bacteria, UA MANY (A) NONE SEEN   Squamous Epithelial / HPF 0-5 0 - 5 /HPF   Mucus PRESENT    Basic Metabolic Panel: Recent Labs  Lab 10/08/22 1705  NA 138  K 4.5  CL 101  CO2 25  GLUCOSE 177*  BUN 22  CREATININE 1.05*  CALCIUM 9.3   Liver Function Tests: Recent Labs  Lab 10/08/22 1705  AST 33  ALT 22  ALKPHOS 52  BILITOT 1.7*  PROT 8.2*  ALBUMIN 3.8   No results for input(s): "LIPASE", "AMYLASE" in the last 168 hours. No results for input(s): "AMMONIA" in the last 168 hours. CBC: Recent Labs  Lab 10/08/22 1705  WBC 9.0  NEUTROABS 7.4  HGB 14.0  HCT 42.3  MCV 87.0  PLT 165    Cardiac Enzymes: No results for input(s): "CKTOTAL", "CKMB", "CKMBINDEX", "TROPONINIHS" in the last 168 hours.  BNP (last 3 results) No results for input(s): "PROBNP" in the last 8760 hours. CBG: No results for input(s): "GLUCAP" in the last 168 hours.  Radiological Exams on Admission:  DG Pelvis Portable  Result Date: 10/08/2022 CLINICAL DATA:  weak EXAM: PORTABLE PELVIS 1-2 VIEWS COMPARISON:  None Available. FINDINGS: There is no evidence of pelvic fracture or diastasis. No acute displaced fracture or dislocation of either hips on frontal view. No pelvic bone lesions are seen. IMPRESSION: Negative. Electronically Signed   By: Tish Frederickson M.D.   On: 10/08/2022 21:01   DG Chest Port 1 View  Result Date: 10/08/2022 CLINICAL DATA:  Weakness. EXAM: PORTABLE CHEST 1 VIEW COMPARISON:  03/26/2017 FINDINGS: Heart size and mediastinal contours are unremarkable. Aortic atherosclerotic calcifications identified. Decreased lung volumes with slight asymmetric elevation of the right hemidiaphragm. No pleural fluid, interstitial edema or airspace disease. No acute osseous findings. IMPRESSION: Low lung volumes. No acute findings. Electronically Signed   By: Signa Kell M.D.   On: 10/08/2022 18:10   CT ABDOMEN PELVIS WO CONTRAST  Result Date: 10/08/2022 CLINICAL DATA:  Constipation for 3 days, abdominal pain, dementia EXAM: CT ABDOMEN AND PELVIS WITHOUT CONTRAST TECHNIQUE: Multidetector CT imaging of the abdomen and pelvis was performed following the standard protocol without IV contrast. RADIATION DOSE REDUCTION: This exam was performed according to the departmental dose-optimization program which includes automated exposure control, adjustment of the mA and/or kV according to patient size and/or use of iterative reconstruction technique. COMPARISON:  09/07/2022 FINDINGS: Lower chest: Dependent hypoventilatory changes. No acute pleural or parenchymal lung disease. Hepatobiliary: Calcified gallstones  without cholecystitis. Unremarkable unenhanced appearance of the liver. Pancreas: Unremarkable unenhanced appearance. Spleen: Unremarkable unenhanced appearance. Adrenals/Urinary Tract: No urinary tract calculi or obstructive uropathy. The adrenals are stable. Bladder is unremarkable. Stomach/Bowel: No bowel obstruction or ileus. Moderate retained stool within the rectal vault without evidence of stercoral colitis. No bowel wall thickening or inflammatory change. Vascular/Lymphatic: Aortic atherosclerosis. No enlarged abdominal or pelvic lymph nodes. Reproductive: Status post hysterectomy. No adnexal masses. Other: No free fluid or free intraperitoneal gas. No abdominal wall hernia. Musculoskeletal: No acute or destructive bony abnormalities. Reconstructed images demonstrate no additional findings. IMPRESSION: 1. Cholelithiasis without cholecystitis. 2. Moderate retained stool within the rectal vault which could reflect fecal impaction. No evidence of stercoral colitis. 3.  Aortic Atherosclerosis (ICD10-I70.0). Electronically Signed   By: Sharlet Salina M.D.   On: 10/08/2022 18:07   CT Head Wo Contrast  Result Date: 10/08/2022 CLINICAL DATA:  History of dementia with mental status change of unknown etiology. EXAM: CT HEAD WITHOUT CONTRAST TECHNIQUE: Contiguous axial images were obtained from the base of the skull through the vertex without intravenous contrast. RADIATION DOSE REDUCTION: This exam was performed according to the departmental dose-optimization program which includes automated exposure control, adjustment of the mA and/or kV according to patient size and/or use of iterative reconstruction technique. COMPARISON:  05/22/2021 FINDINGS: Brain: No evidence of acute infarction, hemorrhage, hydrocephalus, extra-axial collection or mass lesion/mass effect. There is mild diffuse low-attenuation within the subcortical and periventricular white matter compatible with chronic microvascular disease. Prominence  of the sulci and ventricles. Vascular: No hyperdense vessel or unexpected calcification. Skull: Normal. Negative for fracture or focal lesion. Sinuses/Orbits: No acute finding. Other: No scalp hematoma IMPRESSION: 1. No acute intracranial abnormalities. 2. Chronic microvascular disease and brain atrophy. Electronically Signed   By: Signa Kell M.D.   On: 10/08/2022 18:06    EKG: Independently reviewed. Sinus tachy.   Assessment and Plan:  AKI (acute kidney injury) [facility]) Patient has a slight AKI.  With slight hyperglycemia.  Most consistent with infection related metabolic abnormalities.  Insulin trend tonight, patient has received 2 L of fluid in the ER, check urine sodium and creatinine as an add-on.  Monitor response of creatinine to above interventions.  Urinary tract infection Culture is pending, we will treat with ceftriaxone.  Patient still seems to be uncomfortable/slightly painful.  I will give her pain medication standing.  AMS (altered mental status) I believe patient's alteration in mental status is due to her receiving of benzodiazepines.  Per history from the patient's husband at the bedside, patient was talking appropriately, and merely refusing to get out of bed, which she ascribes to her lower abdominal pain.  I really doubt that there is a CNS lesion going on that would present in this manner.  Therefore considering that the patient has not tolerated the MRI thus far, her CAT scan head is negative, I favor continued clinical monitoring and reconsideration of MRI in the morning if symptoms are persistent.  Per report patient was able to be stood up in the ER, and no focal deficit has been identified thus far.  Including on my exam.      Advance Care Planning:   Code Status: Not on file full code  Consults: None  Family Communication: Husband at the bedside  Severity of Illness: The appropriate patient status for this patient is OBSERVATION. Observation status is judged to  be reasonable and necessary in order to provide the required intensity of service to ensure the patient's safety. The patient's presenting symptoms, physical exam findings, and initial radiographic and laboratory data in the context of their medical condition is felt to place them at decreased risk for further clinical deterioration. Furthermore, it is anticipated that the patient will be medically stable for discharge from the hospital within 2 midnights of admission.   Author: Nolberto Hanlon, MD 10/08/2022 10:24 PM  For on call review [email or web address].

## 2022-10-08 NOTE — ED Notes (Signed)
Redness on face improved from previous assessment

## 2022-10-08 NOTE — Progress Notes (Signed)
Patient is evaluated after there is slight erythema noted on the right side of patient's nose, Maller area in the ear in  a band like manner. Husband gives further history that patient had some erythema of her bilateral legs and thighs about 3 weeks prior to current presentation.  At the time patient was started on a tacrolimus topical cream with fair response.  Which has since been stopped.  Patient has not been itching, I inspected rest of the patient's body to a reasonable x-ray extent and I did not appreciate any erythema.  At this time patient has already completed have ceftriaxone dose and the next dose is due at 10 PM tomorrow.  I favor continued clinical monitoring as I am not sure that this represents a true allergy reaction to a systemically administered ceftriaxone with such a localized area of erythema.  However if the erythema worsens or spreads, we will consider it as an allergy to ceftriaxone and avoid giving the patient ceftriaxone.

## 2022-10-08 NOTE — ED Notes (Signed)
Pt not following commands to be able to take PO medications. MD Maryjean Ka made aware. Order to hold PO meds at this time until pts mentation improves to follow commands.

## 2022-10-08 NOTE — ED Notes (Signed)
Received call from MRI stating that pt unable to sit still for scan requesting more medications.

## 2022-10-09 DIAGNOSIS — R5381 Other malaise: Secondary | ICD-10-CM | POA: Diagnosis not present

## 2022-10-09 DIAGNOSIS — G928 Other toxic encephalopathy: Secondary | ICD-10-CM | POA: Diagnosis not present

## 2022-10-09 DIAGNOSIS — L539 Erythematous condition, unspecified: Secondary | ICD-10-CM | POA: Diagnosis not present

## 2022-10-09 DIAGNOSIS — I959 Hypotension, unspecified: Secondary | ICD-10-CM | POA: Diagnosis not present

## 2022-10-09 DIAGNOSIS — J9601 Acute respiratory failure with hypoxia: Secondary | ICD-10-CM | POA: Diagnosis not present

## 2022-10-09 DIAGNOSIS — B961 Klebsiella pneumoniae [K. pneumoniae] as the cause of diseases classified elsewhere: Secondary | ICD-10-CM | POA: Diagnosis not present

## 2022-10-09 DIAGNOSIS — I700 Atherosclerosis of aorta: Secondary | ICD-10-CM | POA: Diagnosis not present

## 2022-10-09 DIAGNOSIS — F03C18 Unspecified dementia, severe, with other behavioral disturbance: Secondary | ICD-10-CM | POA: Diagnosis not present

## 2022-10-09 DIAGNOSIS — T424X5A Adverse effect of benzodiazepines, initial encounter: Secondary | ICD-10-CM | POA: Diagnosis not present

## 2022-10-09 DIAGNOSIS — N179 Acute kidney failure, unspecified: Secondary | ICD-10-CM

## 2022-10-09 DIAGNOSIS — R197 Diarrhea, unspecified: Secondary | ICD-10-CM | POA: Diagnosis not present

## 2022-10-09 DIAGNOSIS — U071 COVID-19: Secondary | ICD-10-CM | POA: Diagnosis not present

## 2022-10-09 DIAGNOSIS — Z8249 Family history of ischemic heart disease and other diseases of the circulatory system: Secondary | ICD-10-CM | POA: Diagnosis not present

## 2022-10-09 DIAGNOSIS — R404 Transient alteration of awareness: Secondary | ICD-10-CM | POA: Diagnosis not present

## 2022-10-09 DIAGNOSIS — R739 Hyperglycemia, unspecified: Secondary | ICD-10-CM | POA: Diagnosis not present

## 2022-10-09 DIAGNOSIS — R410 Disorientation, unspecified: Secondary | ICD-10-CM | POA: Diagnosis not present

## 2022-10-09 DIAGNOSIS — R509 Fever, unspecified: Secondary | ICD-10-CM | POA: Diagnosis not present

## 2022-10-09 DIAGNOSIS — D696 Thrombocytopenia, unspecified: Secondary | ICD-10-CM | POA: Diagnosis not present

## 2022-10-09 DIAGNOSIS — Z751 Person awaiting admission to adequate facility elsewhere: Secondary | ICD-10-CM | POA: Diagnosis not present

## 2022-10-09 DIAGNOSIS — G9341 Metabolic encephalopathy: Secondary | ICD-10-CM | POA: Diagnosis not present

## 2022-10-09 DIAGNOSIS — E785 Hyperlipidemia, unspecified: Secondary | ICD-10-CM | POA: Diagnosis not present

## 2022-10-09 DIAGNOSIS — K5900 Constipation, unspecified: Secondary | ICD-10-CM | POA: Diagnosis not present

## 2022-10-09 DIAGNOSIS — N3001 Acute cystitis with hematuria: Secondary | ICD-10-CM | POA: Diagnosis not present

## 2022-10-09 DIAGNOSIS — R456 Violent behavior: Secondary | ICD-10-CM | POA: Diagnosis not present

## 2022-10-09 DIAGNOSIS — N3000 Acute cystitis without hematuria: Secondary | ICD-10-CM | POA: Diagnosis not present

## 2022-10-09 DIAGNOSIS — N390 Urinary tract infection, site not specified: Secondary | ICD-10-CM | POA: Diagnosis not present

## 2022-10-09 DIAGNOSIS — R4182 Altered mental status, unspecified: Secondary | ICD-10-CM | POA: Diagnosis not present

## 2022-10-09 DIAGNOSIS — E162 Hypoglycemia, unspecified: Secondary | ICD-10-CM | POA: Diagnosis not present

## 2022-10-09 DIAGNOSIS — J189 Pneumonia, unspecified organism: Secondary | ICD-10-CM | POA: Diagnosis not present

## 2022-10-09 DIAGNOSIS — Z7401 Bed confinement status: Secondary | ICD-10-CM | POA: Diagnosis not present

## 2022-10-09 DIAGNOSIS — J690 Pneumonitis due to inhalation of food and vomit: Secondary | ICD-10-CM | POA: Diagnosis not present

## 2022-10-09 DIAGNOSIS — R918 Other nonspecific abnormal finding of lung field: Secondary | ICD-10-CM | POA: Diagnosis not present

## 2022-10-09 DIAGNOSIS — E876 Hypokalemia: Secondary | ICD-10-CM | POA: Diagnosis not present

## 2022-10-09 DIAGNOSIS — B9620 Unspecified Escherichia coli [E. coli] as the cause of diseases classified elsewhere: Secondary | ICD-10-CM | POA: Diagnosis not present

## 2022-10-09 LAB — CBC
HCT: 37.8 % (ref 36.0–46.0)
Hemoglobin: 12.1 g/dL (ref 12.0–15.0)
MCH: 28.2 pg (ref 26.0–34.0)
MCHC: 32 g/dL (ref 30.0–36.0)
MCV: 88.1 fL (ref 80.0–100.0)
Platelets: 142 10*3/uL — ABNORMAL LOW (ref 150–400)
RBC: 4.29 MIL/uL (ref 3.87–5.11)
RDW: 12.6 % (ref 11.5–15.5)
WBC: 9.8 10*3/uL (ref 4.0–10.5)
nRBC: 0 % (ref 0.0–0.2)

## 2022-10-09 LAB — BASIC METABOLIC PANEL
Anion gap: 13 (ref 5–15)
BUN: 15 mg/dL (ref 8–23)
CO2: 23 mmol/L (ref 22–32)
Calcium: 8.9 mg/dL (ref 8.9–10.3)
Chloride: 107 mmol/L (ref 98–111)
Creatinine, Ser: 0.86 mg/dL (ref 0.44–1.00)
GFR, Estimated: >60 mL/min (ref >60)
Glucose, Bld: 118 mg/dL — ABNORMAL HIGH (ref 70–99)
Potassium: 4 mmol/L (ref 3.5–5.1)
Sodium: 143 mmol/L (ref 135–145)

## 2022-10-09 LAB — APTT: aPTT: 35 seconds (ref 24–36)

## 2022-10-09 LAB — PROTIME-INR
INR: 1.3 — ABNORMAL HIGH (ref 0.8–1.2)
Prothrombin Time: 16.5 seconds — ABNORMAL HIGH (ref 11.4–15.2)

## 2022-10-09 LAB — GLUCOSE, CAPILLARY
Glucose-Capillary: 98 mg/dL (ref 70–99)
Glucose-Capillary: 130 mg/dL — ABNORMAL HIGH (ref 70–99)
Glucose-Capillary: 112 mg/dL — ABNORMAL HIGH (ref 70–99)
Glucose-Capillary: 95 mg/dL (ref 70–99)

## 2022-10-09 LAB — URINE CULTURE: Culture: 50000 — AB

## 2022-10-09 MED ORDER — FLEET ENEMA 7-19 GM/118ML RE ENEM
1.0000 | ENEMA | Freq: Once | RECTAL | Status: AC
  Administered 2022-10-09: 1 via RECTAL
  Filled 2022-10-09: qty 1

## 2022-10-09 MED ORDER — LACTULOSE ENEMA
300.0000 mL | ORAL | Status: DC
  Filled 2022-10-09: qty 300

## 2022-10-09 MED ORDER — HYDROMORPHONE HCL 1 MG/ML IJ SOLN
0.5000 mg | INTRAMUSCULAR | Status: DC | PRN
  Administered 2022-10-09: 0.5 mg via INTRAVENOUS
  Filled 2022-10-09: qty 0.5

## 2022-10-09 NOTE — Evaluation (Signed)
Physical Therapy Evaluation Patient Details Name: Amanda [name] MRN: [id number] DOB: 01-18-1946 Today's Date: 10/09/2022  History of Present Illness  The pt is a 77 yo female presenting 6/21 with AMS and abdominal pain. Pt found to have AKI and UTI. PMH includes: moderate-severe dementia, and dyslipidemia.   Clinical Impression  Pt in bed upon arrival of PT, agreeable to evaluation at this time. Prior to admission the pt was living at home with her spouse, and ambulatory despite dementia. No family present during evaluation to confirm and pt not contributing information regarding PLOF or home set up. The pt was dependent on therapist to complete all bed mobility and sit-stand transfers, no attempt to assist with any extremity, but not resisting movements either. The pt needed minA to maintain static sitting EOB, and VSS, but pt with no change in alertness with change in position. Pt mumbling through session, but not able to determine what she is attempting to say or if it has any relation to commands/questions in session. Given AMS, pt currently needs more assist than spouse able to provide, but if her cognition clears and she is better able to participate, would ideally work towards return home with spouse and HHPT.     Recommendations for follow up therapy are one component of a multi-disciplinary discharge planning process, led by the attending physician.  Recommendations may be updated based on patient status, additional functional criteria and insurance authorization.  Follow Up Recommendations Can patient physically be transported by private vehicle: No     Assistance Recommended at Discharge Frequent or constant Supervision/Assistance  Patient can return home with the following  Two people to help with walking and/or transfers;Two people to help with bathing/dressing/bathroom;Assistance with feeding;Assist for transportation;Help with stairs or ramp for entrance    Equipment  Recommendations  (defer until pt more participatory, given mobility today would need a lift, hospital bed, and WC)  Recommendations for Other Services  OT consult    Functional Status Assessment Patient has had a recent decline in their functional status and demonstrates the ability to make significant improvements in function in a reasonable and predictable amount of time.     Precautions / Restrictions Precautions Precautions: Fall Restrictions Weight Bearing Restrictions: No      Mobility  Bed Mobility Overal bed mobility: Needs Assistance Bed Mobility: Supine to Sit, Sit to Supine     Supine to sit: Total assist Sit to supine: Total assist   General bed mobility comments: pt dependent on assist from therapist for LE and trunk, did not resist or assist    Transfers Overall transfer level: Needs assistance Equipment used: 1 person hand held assist Transfers: Sit to/from Stand Sit to Stand: Total assist           General transfer comment: dependent on therapist support to block knees and lift hips from bed, pt did not kick in to assist. maintained standing long enough to place clean bed sheet and pad under the pt    Ambulation/Gait               General Gait Details: pt unable to maintain standing, did not attempt    Balance Overall balance assessment: Needs assistance Sitting-balance support: No upper extremity supported, Feet supported Sitting balance-Leahy Scale: Poor Sitting balance - Comments: dependent on constant minA to maintain   Standing balance support: No upper extremity supported, During functional activity Standing balance-Leahy Scale: Zero Standing balance comment: dependent on therapist to elevate to standing, no attempt to  assist with LE                             Pertinent Vitals/Pain Pain Assessment Pain Assessment: Faces Faces Pain Scale: Hurts a little bit Pain Location: grimacing with movement, pt did not state or  indicate any specific pain Pain Descriptors / Indicators: Grimacing Pain Intervention(s): Limited activity within patient's tolerance, Monitored during session    Home Living Family/patient expects to be discharged to:: Private residence                   Additional Comments: from notes, pt from home with spouse, pt unable to answer any questions at this time and no family present    Prior Function Prior Level of Function : Patient poor historian/Family not available             Mobility Comments: per chart, pt ambulating at baseline       Hand Dominance        Extremity/Trunk Assessment   Upper Extremity Assessment Upper Extremity Assessment: Generalized weakness;Difficult to assess due to impaired cognition    Lower Extremity Assessment Lower Extremity Assessment: Generalized weakness;Difficult to assess due to impaired cognition    Cervical / Trunk Assessment Cervical / Trunk Assessment: Normal  Communication   Communication:  (pt mumbling through session, unable to discern what she was attempting to state)  Cognition Arousal/Alertness: Lethargic Behavior During Therapy: Flat affect Overall Cognitive Status: Difficult to assess                                 General Comments: pt with hx of dementia, but assessment limited by generally low level of arousal. pt with no attempt to follow commands or answer any question at this time. mumbling uninteligibly through session. no attempt to assist with movements even when initiated by PT        General Comments General comments (skin integrity, edema, etc.): VSS on RA        Assessment/Plan    PT Assessment Patient needs continued PT services  PT Problem List Decreased activity tolerance;Decreased balance;Decreased mobility;Decreased cognition;Decreased safety awareness       PT Treatment Interventions Gait training;DME instruction;Stair training;Functional mobility training;Therapeutic  activities;Therapeutic exercise;Balance training;Cognitive remediation    PT Goals (Current goals can be found in the Care Plan section)  Acute Rehab PT Goals Patient Stated Goal: none stated PT Goal Formulation: Patient unable to participate in goal setting Time For Goal Achievement: 10/23/22 Potential to Achieve Goals: Fair    Frequency Min 3X/week        AM-PAC PT "6 Clicks" Mobility  Outcome Measure Help needed turning from your back to your side while in a flat bed without using bedrails?: Total Help needed moving from lying on your back to sitting on the side of a flat bed without using bedrails?: Total Help needed moving to and from a bed to a chair (including a wheelchair)?: Total Help needed standing up from a chair using your arms (e.g., wheelchair or bedside chair)?: Total Help needed to walk in hospital room?: Total Help needed climbing 3-5 steps with a railing? : Total 6 Click Score: 6    End of Session Equipment Utilized During Treatment: Gait belt Activity Tolerance: Patient limited by lethargy Patient left: in bed;with call bell/phone within reach;with bed alarm set Nurse Communication: Mobility status PT Visit Diagnosis: Other abnormalities  of gait and mobility (R26.89);Muscle weakness (generalized) (M62.81)    Time: 2440-1027 PT Time Calculation (min) (ACUTE ONLY): 21 min   Charges:   PT Evaluation $PT Eval Low Complexity: 1 Low          Vickki Muff, PT, DPT   Acute Rehabilitation Department Office [phone] Secure Chat Communication Preferred  Ronnie Derby 10/09/2022, 11:05 AM

## 2022-10-09 NOTE — Plan of Care (Addendum)
Patient alert/oriented to self. Patient ate 0% of breakfast/lunch/dinner. Only had a few bites of applesauce with medication administration. Patient blood sugar has been stable this shift. VSS, will continue to monitor.   Problem: Education: Goal: Ability to describe self-care measures that may prevent or decrease complications (Diabetes Survival Skills Education) will improve Outcome: Progressing   Problem: Education: Goal: Individualized Educational Video(s) Outcome: Progressing   Problem: Coping: Goal: Ability to adjust to condition or change in health will improve Outcome: Progressing   Problem: Fluid Volume: Goal: Ability to maintain a balanced intake and output will improve Outcome: Progressing   Problem: Health Behavior/Discharge Planning: Goal: Ability to identify and utilize available resources and services will improve Outcome: Progressing   Problem: Health Behavior/Discharge Planning: Goal: Ability to manage health-related needs will improve Outcome: Progressing   Problem: Metabolic: Goal: Ability to maintain appropriate glucose levels will improve Outcome: Progressing   Problem: Nutritional: Goal: Maintenance of adequate nutrition will improve Outcome: Progressing   Problem: Nutritional: Goal: Progress toward achieving an optimal weight will improve Outcome: Progressing   Problem: Skin Integrity: Goal: Risk for impaired skin integrity will decrease Outcome: Progressing   Problem: Tissue Perfusion: Goal: Adequacy of tissue perfusion will improve Outcome: Progressing   Problem: Education: Goal: Knowledge of General Education information will improve Description: Including pain rating scale, medication(s)/side effects and non-pharmacologic comfort measures Outcome: Progressing   Problem: Health Behavior/Discharge Planning: Goal: Ability to manage health-related needs will improve Outcome: Progressing   Problem: Clinical Measurements: Goal: Ability to  maintain clinical measurements within normal limits will improve Outcome: Progressing   Problem: Clinical Measurements: Goal: Will remain free from infection Outcome: Progressing   Problem: Clinical Measurements: Goal: Diagnostic test results will improve Outcome: Progressing   Problem: Clinical Measurements: Goal: Respiratory complications will improve Outcome: Progressing   Problem: Clinical Measurements: Goal: Cardiovascular complication will be avoided Outcome: Progressing   Problem: Activity: Goal: Risk for activity intolerance will decrease Outcome: Progressing   Problem: Nutrition: Goal: Adequate nutrition will be maintained Outcome: Progressing   Problem: Coping: Goal: Level of anxiety will decrease Outcome: Progressing   Problem: Elimination: Goal: Will not experience complications related to bowel motility Outcome: Progressing   Problem: Elimination: Goal: Will not experience complications related to urinary retention Outcome: Progressing   Problem: Pain Managment: Goal: General experience of comfort will improve Outcome: Progressing   Problem: Safety: Goal: Ability to remain free from injury will improve Outcome: Progressing   Problem: Skin Integrity: Goal: Risk for impaired skin integrity will decrease Outcome: Progressing

## 2022-10-09 NOTE — Progress Notes (Addendum)
Progress Note   Patient: Amanda [name] [id number]:[id number] DOB: 15-Nov-1945 DOA: 10/08/2022     0 DOS: the patient was seen and examined on 10/09/2022   Brief hospital course: No notes on file  Assessment and Plan: AMS (altered mental status) Moderate dementia with behavioral disturbance As an outpatient she sees neurology for moderate dementia with behavioral disturbance.  She lives with her husband who provides her with significant support.  At her baseline she recognizes family members but is not aware of location, date or time, situation. She has altered mental status in the setting of AKI, UTI, and receiving benzodiazepines. Per history from the patient's husband at the bedside, patient was talking appropriately, and merely refusing to get out of bed, which she ascribes to her lower abdominal pain.  CT head was normal.  MRI was attempted but patient has not tolerate the MRI despite premedication.  She has not had any focal neurological deficits that identified so far.  Will continue clinical monitoring of her altered mental status.  Overall husband thinks patient is improving and her altered mental status is likely due to infection and AKI.  Husband also reports that patient has difficulty taking medications at home -Delirium precautions -Treatment of infection and AKI as below  Urinary tract infection Culture is pending, we will treat with ceftriaxone for now.  Of note husband reports that patient has difficulty to with pills at home so oral antibiotics may be difficult for her. -Ceftriaxone 6/22- -Follow-up urine culture  Thrombocytopenia - Daily CBC  AKI (acute kidney injury) [facility]), resolved Patient admitted with AKI.  patient has received 2 L of fluid in the ER.     Subjective:   Patient was seen and examined.  Husband at bedside.  Husband reports that patient slept a lot yesterday.  At home she is using much more active and has a significant appetite, which she does not have  right now.   Physical Exam: Vitals:   10/08/22 2326 10/08/22 2345 10/09/22 0500 10/09/22 0758  BP: (!) 138/53 (!) 144/58 (!) 119/54 138/72  Pulse: (!) 104 (!) 101 78 89  Resp: 20 18 18 18   Temp: 98.4 F (36.9 C) 98.5 F (36.9 C) 98.6 F (37 C) 98 F (36.7 C)  TempSrc: Axillary Oral Oral   SpO2: 97% 95% 96% 98%  Weight:      Height:       Physical Exam Vitals and nursing note reviewed.  Constitutional:      General: She is sleeping.     Appearance: She is normal weight.  HENT:     Head: Normocephalic and atraumatic.  Cardiovascular:     Rate and Rhythm: Normal rate.     Heart sounds: Murmur heard.  Pulmonary:     Effort: Pulmonary effort is normal. No respiratory distress.  Abdominal:     General: Abdomen is flat. Bowel sounds are normal.     Palpations: Abdomen is soft.  Musculoskeletal:     Right lower leg: No edema.     Left lower leg: No edema.  Neurological:     General: No focal deficit present.     Mental Status: She is lethargic and disoriented.  Psychiatric:        Mood and Affect: Mood normal.        Behavior: Behavior normal.     Data Reviewed:     Latest Ref Rng & Units 10/09/2022    6:39 AM 10/08/2022    5:05 PM 09/06/2022  11:01 PM  CBC  WBC 4.0 - 10.5 K/uL 9.8  9.0  8.0   Hemoglobin 12.0 - 15.0 g/dL 53.6  64.4  03.4   Hematocrit 36.0 - 46.0 % 37.8  42.3  43.1   Platelets 150 - 400 K/uL 142  165  236       Latest Ref Rng & Units 10/09/2022    6:39 AM 10/08/2022    5:05 PM 09/06/2022   11:01 PM  BMP  Glucose 70 - 99 mg/dL 742  595  638   BUN 8 - 23 mg/dL 15  22  11    Creatinine 0.44 - 1.00 mg/dL 7.56  4.33  2.95   Sodium 135 - 145 mmol/L 143  138  134   Potassium 3.5 - 5.1 mmol/L 4.0  4.5  4.5   Chloride 98 - 111 mmol/L 107  101  102   CO2 22 - 32 mmol/L 23  25  19    Calcium 8.9 - 10.3 mg/dL 8.9  9.3  9.1    UA moderate leukocyte esterase, positive nitrate Urine culture pending  Family Communication: Discussed with family at  bedside   Disposition: Status is: Observation The patient will require care spanning > 2 midnights and should be moved to inpatient because: Ongoing altered mental status  Planned Discharge Destination: Home with Home Health    Time spent: 30 minutes  Author: Charolotte Eke, MD 10/09/2022 11:48 AM  For on call review [email or web address].

## 2022-10-10 LAB — COMPREHENSIVE METABOLIC PANEL
ALT: 17 U/L (ref 0–44)
AST: 28 U/L (ref 15–41)
Albumin: 2.7 g/dL — ABNORMAL LOW (ref 3.5–5.0)
Alkaline Phosphatase: 57 U/L (ref 38–126)
Anion gap: 10 (ref 5–15)
BUN: 14 mg/dL (ref 8–23)
CO2: 23 mmol/L (ref 22–32)
Calcium: 8.5 mg/dL — ABNORMAL LOW (ref 8.9–10.3)
Chloride: 111 mmol/L (ref 98–111)
Creatinine, Ser: 0.85 mg/dL (ref 0.44–1.00)
GFR, Estimated: >60 mL/min (ref >60)
Glucose, Bld: 121 mg/dL — ABNORMAL HIGH (ref 70–99)
Potassium: 3.2 mmol/L — ABNORMAL LOW (ref 3.5–5.1)
Sodium: 144 mmol/L (ref 135–145)
Total Bilirubin: 0.6 mg/dL (ref 0.3–1.2)
Total Protein: 6.5 g/dL (ref 6.5–8.1)

## 2022-10-10 LAB — CBC
HCT: 34.9 % — ABNORMAL LOW (ref 36.0–46.0)
Hemoglobin: 11.8 g/dL — ABNORMAL LOW (ref 12.0–15.0)
MCH: 29.2 pg (ref 26.0–34.0)
MCHC: 33.8 g/dL (ref 30.0–36.0)
MCV: 86.4 fL (ref 80.0–100.0)
Platelets: 164 10*3/uL (ref 150–400)
RBC: 4.04 MIL/uL (ref 3.87–5.11)
RDW: 12.7 % (ref 11.5–15.5)
WBC: 10.3 10*3/uL (ref 4.0–10.5)
nRBC: 0 % (ref 0.0–0.2)

## 2022-10-10 LAB — URINE CULTURE

## 2022-10-10 LAB — GLUCOSE, CAPILLARY
Glucose-Capillary: 116 mg/dL — ABNORMAL HIGH (ref 70–99)
Glucose-Capillary: 105 mg/dL — ABNORMAL HIGH (ref 70–99)
Glucose-Capillary: 101 mg/dL — ABNORMAL HIGH (ref 70–99)
Glucose-Capillary: 123 mg/dL — ABNORMAL HIGH (ref 70–99)

## 2022-10-10 MED ORDER — ACETAMINOPHEN 650 MG RE SUPP
650.0000 mg | RECTAL | Status: DC | PRN
  Filled 2022-10-10: qty 1

## 2022-10-10 MED ORDER — ACETAMINOPHEN 650 MG RE SUPP
650.0000 mg | RECTAL | Status: DC
  Administered 2022-10-10 – 2022-10-13 (×12): 650 mg via RECTAL
  Filled 2022-10-10 (×12): qty 1

## 2022-10-10 MED ORDER — SODIUM CHLORIDE 0.9 % IV SOLN
1.0000 g | INTRAVENOUS | Status: DC
  Administered 2022-10-10: 1 g via INTRAVENOUS
  Filled 2022-10-10: qty 10

## 2022-10-10 MED ORDER — POTASSIUM CHLORIDE 20 MEQ PO PACK
40.0000 meq | PACK | ORAL | Status: AC
  Administered 2022-10-10: 40 meq via ORAL
  Filled 2022-10-10 (×2): qty 2

## 2022-10-10 MED ORDER — POTASSIUM CHLORIDE IN NACL 20-0.9 MEQ/L-% IV SOLN
INTRAVENOUS | Status: DC
  Filled 2022-10-10 (×2): qty 1000

## 2022-10-10 NOTE — Progress Notes (Signed)
Progress Note   Patient: Amanda [name] [id number]:[id number] DOB: 1945-05-17 DOA: 10/08/2022     1 DOS: the patient was seen and examined on 10/10/2022   Brief hospital course: Patient is a 77 year old female with moderate to severe dementia and dyslipidemia.  Patient was admitted with abdominal pain, urine culture grew Klebsiella pneumonia.  Will continue IV Rocephin.   Assessment and Plan: Combined toxic and metabolic encephalopathy, acute: Moderate dementia with behavioral disturbance: As an outpatient she sees neurology for moderate dementia with behavioral disturbance.  She lives with her husband who provides her with significant support.  At her baseline she recognizes family members but is not aware of location, date or time, situation. She has altered mental status in the setting of AKI, UTI, and receiving benzodiazepines. Per history from the patient's husband at the bedside, patient was talking appropriately, and merely refusing to get out of bed, which she ascribes to her lower abdominal pain.  CT head was normal.  MRI was attempted but patient has not tolerate the MRI despite premedication.  She has not had any focal neurological deficits that identified so far.  Will continue clinical monitoring of her altered mental status.  Overall husband thinks patient is improving and her altered mental status is likely due to infection and AKI.  Husband also reports that patient has difficulty taking medications at home -Delirium precautions -Treatment of infection and AKI as below 10/10/2022: Acute kidney injury has resolved.  Continue IV fluids.  Will treat UTI with IV Rocephin.  Urinary tract infection Culture is pending, we will treat with ceftriaxone for now.  Of note husband reports that patient has difficulty to with pills at home so oral antibiotics may be difficult for her. -Ceftriaxone 6/22- -Follow-up urine culture 10/10/2022: Urine culture grew Klebsiella Pneumonia.  Continue IV  antibiotics (Rocephin)..  Thrombocytopenia - Daily CBC 10/10/2022: Resolved.  Platelet count is 164 today.  AKI (acute kidney injury) [facility]), resolved Patient admitted with AKI.  patient has received 2 L of fluid in the ER. 10/10/2022: Resolved.  Likely prerenal.  Hypokalemia: -Potassium of 3.2. -Replete.  Continue to monitor renal function and electrolytes.  Subjective:    Physical Exam: Vitals:   10/10/22 0021 10/10/22 0510 10/10/22 0752 10/10/22 1522  BP:  (!) 126/56 138/67 139/72  Pulse:  81 69 77  Resp:  17 17 18   Temp: 98.4 F (36.9 C) 98.8 F (37.1 C) 98 F (36.7 C) 98 F (36.7 C)  TempSrc: Axillary Oral Oral   SpO2:  100% 99% 98%  Weight:      Height:       Physical Exam Vitals and nursing note reviewed.  Constitutional:      General: She is sleeping.     Appearance: She is normal weight.  HENT:     Head: Normocephalic and atraumatic.  Cardiovascular:     Rate and Rhythm: Normal rate.     Heart sounds: Murmur heard.  Pulmonary:     Effort: Pulmonary effort is normal. No respiratory distress.  Abdominal:     General: Abdomen is flat. Bowel sounds are normal.     Palpations: Abdomen is soft.  Musculoskeletal:     Right lower leg: No edema.     Left lower leg: No edema.  Neurological:     General: No focal deficit present.     Mental Status: She is lethargic and disoriented.  Psychiatric:        Mood and Affect: Mood normal.  Behavior: Behavior normal.     Data Reviewed:     Latest Ref Rng & Units 10/10/2022    5:48 AM 10/09/2022    6:39 AM 10/08/2022    5:05 PM  CBC  WBC 4.0 - 10.5 K/uL 10.3  9.8  9.0   Hemoglobin 12.0 - 15.0 g/dL 16.1  09.6  04.5   Hematocrit 36.0 - 46.0 % 34.9  37.8  42.3   Platelets 150 - 400 K/uL 164  142  165       Latest Ref Rng & Units 10/10/2022    5:48 AM 10/09/2022    6:39 AM 10/08/2022    5:05 PM  BMP  Glucose 70 - 99 mg/dL 409  811  914   BUN 8 - 23 mg/dL 14  15  22    Creatinine 0.44 - 1.00 mg/dL 7.82   9.56  2.13   Sodium 135 - 145 mmol/L 144  143  138   Potassium 3.5 - 5.1 mmol/L 3.2  4.0  4.5   Chloride 98 - 111 mmol/L 111  107  101   CO2 22 - 32 mmol/L 23  23  25    Calcium 8.9 - 10.3 mg/dL 8.5  8.9  9.3    UA moderate leukocyte esterase, positive nitrate Urine culture pending  Family Communication: Husband.  Disposition: Status is: Observation The patient will require care spanning > 2 midnights and should be moved to inpatient because: Ongoing altered mental status  Planned Discharge Destination: Home with Home Health    Time spent: 35 minutes  Author: Barnetta Chapel, MD 10/10/2022 3:28 PM  For on call review [email or web address].

## 2022-10-10 NOTE — Progress Notes (Signed)
[facility] J. Garner Nash notified that Tylenol is schedule with PO route and patient has dementia and can be combative at times she refused the po Potassium and I requested rectal route for Tylenol runing low grade temp 100.7

## 2022-10-10 NOTE — TOC Initial Note (Signed)
Transition of Care [facility]) - Initial/Assessment Note    Patient Details  Name: Amanda [name] MRN: [id number] Date of Birth: 1946/01/04  Transition of Care [facility]) CM/SW Contact:    Leander Rams, LCSW Phone Number: 10/10/2022, 10:20 AM  Clinical Narrative:                 CSW spoke with pt spouse [name]. CSW discussed the recommendation for STR at SNF. [name] reports he had been taking care of pt for the past 5 years. Pt spouse did express some hesitancy regarding STR and states he would need to discuss in greater detail with his children before making a final decision. [name] did agree for a referral to be completed and faxed out.   CSW completed fl2 and faxed out. TOC will continue to follow.   Expected Discharge Plan: Skilled Nursing Facility Barriers to Discharge: Continued Medical Work up   Patient Goals and CMS Choice            Expected Discharge Plan and Services     Post Acute Care Choice: Skilled Nursing Facility Living arrangements for the past 2 months: Single Family Home                                      Prior Living Arrangements/Services Living arrangements for the past 2 months: Single Family Home Lives with:: Spouse Patient language and need for interpreter reviewed::  (Assessment completed with spouse) Do you feel safe going back to the place where you live?: Yes               Activities of Daily Living Home Assistive Devices/Equipment: None ADL Screening (condition at time of admission) Patient's cognitive ability adequate to safely complete daily activities?: No Is the patient deaf or have difficulty hearing?: No Does the patient have difficulty seeing, even when wearing glasses/contacts?: No Does the patient have difficulty concentrating, remembering, or making decisions?: Yes Patient able to express need for assistance with ADLs?: No Does the patient have difficulty dressing or bathing?: No Independently performs ADLs?:  No Communication: Needs assistance Dressing (OT): Needs assistance Does the patient have difficulty walking or climbing stairs?: No Weakness of Legs: Both Weakness of Arms/Hands: Both  Permission Sought/Granted                  Emotional Assessment   Attitude/Demeanor/Rapport: Unable to Assess Affect (typically observed): Unable to Assess Orientation: : Oriented to Self Alcohol / Substance Use: Not Applicable Psych Involvement: No (comment)  Admission diagnosis:  UTI (urinary tract infection) [N39.0] Acute cystitis with hematuria [N30.01] Altered mental status [R41.82] Patient Active Problem List   Diagnosis Date Noted   Altered mental status 10/09/2022   AMS (altered mental status) 10/08/2022   Urinary tract infection 10/08/2022   AKI (acute kidney injury) (HCC) 10/08/2022   UTI (urinary tract infection) 10/08/2022   Moderate dementia (HCC) 10/02/2014   Hyperlipidemia with target low density lipoprotein (LDL) cholesterol less than 130 mg/dL 78/29/5621   Hypertension 06/12/2012   PCP:  [name], MD Pharmacy:   [facility] [address] [place] - [address] [place] [address] Phone: [phone] Fax: [phone]  [id number] - [place], [place] - [address] AT [address] OF [facility] [address] [place] [address] Phone: [phone] Fax: [phone]     Social Determinants of Health (SDOH) Social History: SDOH  Screenings   Food Insecurity: No Food Insecurity (10/09/2022)  Housing: Low Risk  (09/14/2022)  Transportation Needs: No Transportation Needs (10/09/2022)  Utilities: Not At Risk (10/09/2022)  Depression (PHQ2-9): Low Risk  (09/14/2022)  Financial Resource Strain: Low Risk  (09/14/2022)  Physical Activity: Inactive (09/14/2022)  Stress: No Stress Concern Present (09/14/2022)  Tobacco Use: Low Risk  (10/08/2022)   SDOH Interventions:     Readmission Risk  Interventions     No data to display         Oletta Lamas, MSW, LCSWA, LCASA Transitions of Care  Clinical Social Worker I

## 2022-10-10 NOTE — Progress Notes (Signed)
Hello this patient has dementia and can be combative when trying to render care she was admitted 6/21 with UTI AMS AKI she did refuse her po potassium and she has Tylenol po scheduled she was running low grade temp 100.7 can I get order for rectal route for the tylenol Dr. [name] notified

## 2022-10-10 NOTE — Evaluation (Signed)
Occupational Therapy Evaluation Patient Details Name: Amanda [name] MRN: [id number] DOB: 1945/09/17 Today's Date: 10/10/2022   History of Present Illness The pt is a 77 yo female presenting 6/21 with AMS and abdominal pain. Pt found to have AKI and UTI. PMH includes: moderate-severe dementia, and dyslipidemia.   Clinical Impression   PTA, pt lived with husband who assisted with grooming, bathing, IADL, and provided supervision/cues for dressing. Upon eval, pt requiring max -total A for all mobility and with limited self-initiated movement. Able to sit EOB min guard A, but ultimately total A to any transfers. Pt requiring total A for grooming this session, but later conversation with husband reveals he typically does this for her. Pt with increased reciprocal social skills this session as compared to report in PT eval yesterday with smiling at OT and with some verbal responses. Will continue to follow and recommending inpatient rehabilitation <3 hours/day.      Recommendations for follow up therapy are one component of a multi-disciplinary discharge planning process, led by the attending physician.  Recommendations may be updated based on patient status, additional functional criteria and insurance authorization.   Assistance Recommended at Discharge Frequent or constant Supervision/Assistance  Patient can return home with the following Two people to help with walking and/or transfers;A lot of help with bathing/dressing/bathroom;Assistance with cooking/housework;Direct supervision/assist for medications management;Direct supervision/assist for financial management;Assist for transportation;Help with stairs or ramp for entrance;Assistance with feeding    Functional Status Assessment  Patient has had a recent decline in their functional status and demonstrates the ability to make significant improvements in function in a reasonable and predictable amount of time.  Equipment Recommendations   Other (comment) (defer)    Recommendations for Other Services       Precautions / Restrictions Precautions Precautions: Fall Restrictions Weight Bearing Restrictions: No      Mobility Bed Mobility Overal bed mobility: Needs Assistance Bed Mobility: Supine to Sit, Sit to Supine     Supine to sit: Max assist Sit to supine: Max assist        Transfers Overall transfer level: Needs assistance Equipment used: 1 person hand held assist Transfers: Sit to/from Stand Sit to Stand: Total assist                  Balance Overall balance assessment: Needs assistance Sitting-balance support: No upper extremity supported, Feet supported Sitting balance-Leahy Scale: Poor Sitting balance - Comments: min guard A EOB   Standing balance support: No upper extremity supported, During functional activity Standing balance-Leahy Scale: Zero Standing balance comment: dependent on therapist to elevate to standing, no attempt to assist with LE                           ADL either performed or assessed with clinical judgement   ADL Overall ADL's : Needs assistance/impaired     Grooming: Total assistance;Bed level   Upper Body Bathing: Total assistance;Bed level   Lower Body Bathing: Total assistance   Upper Body Dressing : Total assistance   Lower Body Dressing: Total assistance   Toilet Transfer: Total assistance                   Vision   Vision Assessment?: Vision impaired- to be further tested in functional context Additional Comments: Suspect peripheral impairment consistent with dementia     Perception     Praxis      Pertinent Vitals/Pain Pain Assessment Pain Assessment: Faces Faces  Pain Scale: Hurts a little bit Pain Location: grimacing with movement, pt did not state or indicate any specific pain Pain Descriptors / Indicators: Grimacing Pain Intervention(s): Limited activity within patient's tolerance, Monitored during session      Hand Dominance     Extremity/Trunk Assessment Upper Extremity Assessment Upper Extremity Assessment: Generalized weakness   Lower Extremity Assessment Lower Extremity Assessment: Generalized weakness   Cervical / Trunk Assessment Cervical / Trunk Assessment: Normal   Communication     Cognition Arousal/Alertness: Lethargic Behavior During Therapy: Flat affect Overall Cognitive Status: Difficult to assess                                 General Comments: pt with hx of dementia, but assessment limited by generally low level of arousal. pt with no attempt to follow commands or answer any question at this time. mumbling uninteligibly through session. no attempt to assist with movements even when initiated by PT     General Comments  VSS on RA    Exercises     Shoulder Instructions      Home Living Family/patient expects to be discharged to:: Private residence Living Arrangements: Spouse/significant other                               Additional Comments: from notes, pt from home with spouse, pt unable to answer any questions at this time and no family present      Prior Functioning/Environment Prior Level of Function : Patient poor historian/Family not available             Mobility Comments: per chart, pt ambulating at baseline ADLs Comments: assisted with grooming, bathing, IADL, and provided supervision/cues for dressing        OT Problem List: Decreased strength;Impaired balance (sitting and/or standing);Decreased activity tolerance;Decreased coordination;Decreased cognition;Decreased safety awareness;Decreased knowledge of use of DME or AE      OT Treatment/Interventions: Self-care/ADL training;Therapeutic exercise;DME and/or AE instruction;Balance training;Patient/family education;Therapeutic activities    OT Goals(Current goals can be found in the care plan section) Acute Rehab OT Goals Patient Stated Goal: unable OT Goal  Formulation: With patient Time For Goal Achievement: 10/24/22 Potential to Achieve Goals: Fair  OT Frequency: Min 2X/week    Co-evaluation              AM-PAC OT "6 Clicks" Daily Activity     Outcome Measure Help from another person eating meals?: A Lot Help from another person taking care of personal grooming?: A Lot Help from another person toileting, which includes using toliet, bedpan, or urinal?: A Lot Help from another person bathing (including washing, rinsing, drying)?: A Lot Help from another person to put on and taking off regular upper body clothing?: A Lot Help from another person to put on and taking off regular lower body clothing?: A Lot 6 Click Score: 12   End of Session Equipment Utilized During Treatment: Gait belt Nurse Communication: Mobility status  Activity Tolerance: Patient tolerated treatment well Patient left: in bed;with call bell/phone within reach;with bed alarm set  OT Visit Diagnosis: Unsteadiness on feet (R26.81);Muscle weakness (generalized) (M62.81);Other symptoms and signs involving cognitive function                Time: 1308-6578 OT Time Calculation (min): 16 min Charges:  OT General Charges $OT Visit: 1 Visit OT Evaluation $OT Eval Moderate Complexity:  1 Mod  Tyler Deis, OTR/L [facility] Acute Rehabilitation Office: [phone]   Amanda [name] 10/10/2022, 11:44 AM

## 2022-10-10 NOTE — NC FL2 (Signed)
  [place] MEDICAID FL2 LEVEL OF CARE FORM     IDENTIFICATION  Patient Name: Amanda [name] Birthdate: 05-06-1945 Sex: female Admission Date (Current Location): 10/08/2022  [employer] and [place] Number:  Producer, television/film/video and Address:  [facility], [address] [place], [place] [address]      Provider Number: [id number]  Attending Physician Name and Address:  Barnetta Chapel, MD  Relative Name and Phone Number:  [name] [phone]    Current Level of Care: Hospital Recommended Level of Care: Skilled Nursing Facility Prior Approval Number:    Date Approved/Denied:   PASRR Number: [id number]  Discharge Plan: SNF    Current Diagnoses: Patient Active Problem List   Diagnosis Date Noted   Altered mental status 10/09/2022   AMS (altered mental status) 10/08/2022   Urinary tract infection 10/08/2022   AKI (acute kidney injury) (HCC) 10/08/2022   UTI (urinary tract infection) 10/08/2022   Moderate dementia (HCC) 10/02/2014   Hyperlipidemia with target low density lipoprotein (LDL) cholesterol less than 130 mg/dL 34/74/2595   Hypertension 06/12/2012    Orientation RESPIRATION BLADDER Height & Weight     Self  Normal Incontinent, External catheter Weight: 138 lb 0.1 oz (62.6 kg) Height:  5\' 5"  (165.1 cm)  BEHAVIORAL SYMPTOMS/MOOD NEUROLOGICAL BOWEL NUTRITION STATUS      Continent Diet (See dc summary)  AMBULATORY STATUS COMMUNICATION OF NEEDS Skin   Extensive Assist Verbally Normal                       Personal Care Assistance Level of Assistance  Bathing, Feeding, Dressing Bathing Assistance: Maximum assistance Feeding assistance: Independent Dressing Assistance: Maximum assistance     Functional Limitations Info  Sight, Hearing, Speech Sight Info: Adequate Hearing Info: Adequate Speech Info: Adequate    SPECIAL CARE FACTORS FREQUENCY  PT (By licensed PT), OT (By licensed OT)     PT Frequency: 5xweek OT  Frequency: 5xweek            Contractures Contractures Info: Not present    Additional Factors Info  Code Status Code Status Info: Full             Current Medications (10/10/2022):  This is the current hospital active medication list Current Facility-Administered Medications  Medication Dose Route Frequency Provider Last Rate Last Admin   acetaminophen (TYLENOL) tablet 650 mg  650 mg Oral Q4H Nolberto Hanlon, MD   650 mg at 10/10/22 0601   enoxaparin (LOVENOX) injection 40 mg  40 mg Subcutaneous Q24H Nolberto Hanlon, MD   40 mg at 10/09/22 1010   insulin aspart (novoLOG) injection 0-15 Units  0-15 Units Subcutaneous TID WC Nolberto Hanlon, MD       insulin aspart (novoLOG) injection 0-5 Units  0-5 Units Subcutaneous QHS Nolberto Hanlon, MD       polyethylene glycol (MIRALAX / GLYCOLAX) packet 17 g  17 g Oral BID Nolberto Hanlon, MD   17 g at 10/10/22 0955   sodium chloride flush (NS) 0.9 % injection 3 mL  3 mL Intravenous Q12H Nolberto Hanlon, MD   3 mL at 10/10/22 6387     Discharge Medications: Please see discharge summary for a list of discharge medications.  Relevant Imaging Results:  Relevant Lab Results:   Additional Information SSN: [id number]  Oletta Lamas, MSW, [facility] Transitions of Care  Clinical Social Worker I

## 2022-10-11 ENCOUNTER — Inpatient Hospital Stay (HOSPITAL_COMMUNITY): Payer: PPO

## 2022-10-11 DIAGNOSIS — N3000 Acute cystitis without hematuria: Secondary | ICD-10-CM | POA: Diagnosis not present

## 2022-10-11 LAB — CBC WITH DIFFERENTIAL/PLATELET
Abs Immature Granulocytes: 0.05 10*3/uL (ref 0.00–0.07)
Basophils Absolute: 0 10*3/uL (ref 0.0–0.1)
Basophils Relative: 0 %
Eosinophils Absolute: 0.1 10*3/uL (ref 0.0–0.5)
Eosinophils Relative: 1 %
HCT: 33.7 % — ABNORMAL LOW (ref 36.0–46.0)
Hemoglobin: 10.8 g/dL — ABNORMAL LOW (ref 12.0–15.0)
Immature Granulocytes: 1 %
Lymphocytes Relative: 8 %
Lymphs Abs: 0.8 10*3/uL (ref 0.7–4.0)
MCH: 28.5 pg (ref 26.0–34.0)
MCHC: 32 g/dL (ref 30.0–36.0)
MCV: 88.9 fL (ref 80.0–100.0)
Monocytes Absolute: 1.2 10*3/uL — ABNORMAL HIGH (ref 0.1–1.0)
Monocytes Relative: 10 %
Neutro Abs: 8.9 10*3/uL — ABNORMAL HIGH (ref 1.7–7.7)
Neutrophils Relative %: 80 %
Platelets: 205 10*3/uL (ref 150–400)
RBC: 3.79 MIL/uL — ABNORMAL LOW (ref 3.87–5.11)
RDW: 12.8 % (ref 11.5–15.5)
WBC: 11 10*3/uL — ABNORMAL HIGH (ref 4.0–10.5)
nRBC: 0 % (ref 0.0–0.2)

## 2022-10-11 LAB — RENAL FUNCTION PANEL
Albumin: 2.6 g/dL — ABNORMAL LOW (ref 3.5–5.0)
Anion gap: 10 (ref 5–15)
BUN: 10 mg/dL (ref 8–23)
CO2: 21 mmol/L — ABNORMAL LOW (ref 22–32)
Calcium: 8.1 mg/dL — ABNORMAL LOW (ref 8.9–10.3)
Chloride: 112 mmol/L — ABNORMAL HIGH (ref 98–111)
Creatinine, Ser: 0.72 mg/dL (ref 0.44–1.00)
GFR, Estimated: >60 mL/min (ref >60)
Glucose, Bld: 113 mg/dL — ABNORMAL HIGH (ref 70–99)
Phosphorus: 2 mg/dL — ABNORMAL LOW (ref 2.5–4.6)
Potassium: 3.6 mmol/L (ref 3.5–5.1)
Sodium: 143 mmol/L (ref 135–145)

## 2022-10-11 LAB — GLUCOSE, CAPILLARY
Glucose-Capillary: 117 mg/dL — ABNORMAL HIGH (ref 70–99)
Glucose-Capillary: 106 mg/dL — ABNORMAL HIGH (ref 70–99)
Glucose-Capillary: 136 mg/dL — ABNORMAL HIGH (ref 70–99)
Glucose-Capillary: 104 mg/dL — ABNORMAL HIGH (ref 70–99)

## 2022-10-11 LAB — MAGNESIUM: Magnesium: 1.9 mg/dL (ref 1.7–2.4)

## 2022-10-11 MED ORDER — CEFAZOLIN SODIUM-DEXTROSE 1-4 GM/50ML-% IV SOLN
1.0000 g | Freq: Three times a day (TID) | INTRAVENOUS | Status: DC
  Administered 2022-10-11: 1 g via INTRAVENOUS
  Filled 2022-10-11: qty 50

## 2022-10-11 MED ORDER — SODIUM CHLORIDE 0.9 % IV SOLN
2.0000 g | Freq: Two times a day (BID) | INTRAVENOUS | Status: DC
  Administered 2022-10-11 – 2022-10-12 (×2): 2 g via INTRAVENOUS
  Filled 2022-10-11 (×2): qty 12.5

## 2022-10-11 NOTE — Progress Notes (Signed)
Progress Note   Patient: Amanda [name]:[id number] DOB: 07-06-1945 DOA: 10/08/2022     2 DOS: the patient was seen and examined on 10/11/2022   Brief hospital course: Patient is a 77 year old female with moderate to severe dementia and dyslipidemia.  Patient was admitted with abdominal pain, urine culture grew Klebsiella pneumonia.  Will continue IV Rocephin.  10/11/2022: Patient seen alongside patient's husband.  Patient is more interactive today.  Urine culture grew Klebsiella.  Patient is still having fever.  Further workup revealed likely right middle and right lower lobe pneumonia.  Will change antibiotics to cefepime.  Continue to manage expectantly.   Assessment and Plan: AMS (altered mental status) Moderate dementia with behavioral disturbance As an outpatient she sees neurology for moderate dementia with behavioral disturbance.  She lives with her husband who provides her with significant support.  At her baseline she recognizes family members but is not aware of location, date or time, situation. She has altered mental status in the setting of AKI, UTI, and receiving benzodiazepines. Per history from the patient's husband at the bedside, patient was talking appropriately, and merely refusing to get out of bed, which she ascribes to her lower abdominal pain.  CT head was normal.  MRI was attempted but patient has not tolerate the MRI despite premedication.  She has not had any focal neurological deficits that identified so far.  Will continue clinical monitoring of her altered mental status.  Overall husband thinks patient is improving and her altered mental status is likely due to infection and AKI.  Husband also reports that patient has difficulty taking medications at home -Delirium precautions -Treatment of infection and AKI as below 10/10/2022: Acute kidney injury has resolved.  Continue IV fluids.  Will treat UTI with IV Rocephin. 10/11/2022: Chest x-ray revealed infiltrate  involving the right middle and right lower lobe.  Will start patient on IV cefepime. Urinary tract infection Culture is pending, we will treat with ceftriaxone for now.  Of note husband reports that patient has difficulty to with pills at home so oral antibiotics may be difficult for her. -Ceftriaxone 6/22- -Follow-up urine culture 10/10/2022: Urine culture grew E. coli.  Continue IV antibiotics (Rocephin).. 10/11/2022: No behavioral problems noted today.  Altered mental status is slowly improving.  Urine culture grew Klebsiella pneumonia.  Patient continues to have fever.  Chest x-ray done today revealed right middle and lower lobe infiltrates.  Will change antibiotic to cefepime.  Speech evaluation.  Thrombocytopenia - Daily CBC 10/10/2022: Resolved.  Platelet count is 164 today.  AKI (acute kidney injury) [facility]), resolved Patient admitted with AKI.  patient has received 2 L of fluid in the ER. 10/10/2022: Resolved.  Likely prerenal.  Hypokalemia: -Potassium of 3.2. -Replete.  Continue to monitor renal function and electrolytes. 10/11/2022: Resolved.  Potassium is 3.8 today.  Subjective:  -Patient is more interactive today. -No significant history from patient.  Physical Exam: Vitals:   10/11/22 0507 10/11/22 0513 10/11/22 0735 10/11/22 1600  BP: (!) 131/50  (!) 113/56 (!) 113/47  Pulse: 88  80 68  Resp: 18  18 18   Temp:  (!) 100.7 F (38.2 C) 98.6 F (37 C) 98.3 F (36.8 C)  TempSrc:  Oral    SpO2: 99%  97% 100%  Weight:      Height:       Physical Exam Vitals and nursing note reviewed.  Constitutional:      General: She is sleeping.     Appearance: She is normal  weight.  HENT:     Head: Normocephalic and atraumatic.  Cardiovascular:     Rate and Rhythm: Normal rate.     Heart sounds: Murmur heard.  Pulmonary:     Effort: Pulmonary effort is normal. No respiratory distress.  Abdominal:     General: Abdomen is flat. Bowel sounds are normal.     Palpations: Abdomen is  soft.  Musculoskeletal:     Right lower leg: No edema.     Left lower leg: No edema.  Neurological:     General: No focal deficit present.     Mental Status: She is lethargic and disoriented.  Psychiatric:        Mood and Affect: Mood normal.        Behavior: Behavior normal.     Data Reviewed:     Latest Ref Rng & Units 10/11/2022    5:58 AM 10/10/2022    5:48 AM 10/09/2022    6:39 AM  CBC  WBC 4.0 - 10.5 K/uL 11.0  10.3  9.8   Hemoglobin 12.0 - 15.0 g/dL 16.1  09.6  04.5   Hematocrit 36.0 - 46.0 % 33.7  34.9  37.8   Platelets 150 - 400 K/uL 205  164  142       Latest Ref Rng & Units 10/11/2022    5:58 AM 10/10/2022    5:48 AM 10/09/2022    6:39 AM  BMP  Glucose 70 - 99 mg/dL 409  811  914   BUN 8 - 23 mg/dL 10  14  15    Creatinine 0.44 - 1.00 mg/dL 7.82  9.56  2.13   Sodium 135 - 145 mmol/L 143  144  143   Potassium 3.5 - 5.1 mmol/L 3.6  3.2  4.0   Chloride 98 - 111 mmol/L 112  111  107   CO2 22 - 32 mmol/L 21  23  23    Calcium 8.9 - 10.3 mg/dL 8.1  8.5  8.9    UA moderate leukocyte esterase, positive nitrate Urine culture pending  Family Communication: Husband.  Disposition: Status is: Inpatient The patient will require care spanning > 2 midnights and should be moved to inpatient because: Ongoing altered mental status  Planned Discharge Destination: Home with Home Health    Time spent: 35 minutes  Author: Barnetta Chapel, MD 10/11/2022 6:34 PM  For on call review [email or web address].

## 2022-10-11 NOTE — Progress Notes (Signed)
Pharmacy Antibiotic Note  Amanda [name] is a 77 y.o. female admitted on 10/08/2022 with AMS.  Noted pt has been on Rocephin ->Ancef for klebsiella UTI since 6/21. Pharmacy now consulted for Cefepime dosing for PNA.   Plan: Cefepime 2gm IV q12h Will f/u renal function, micro data, and pt's clinical condition  Height: 5\' 5"  (165.1 cm) Weight: 62.6 kg (138 lb 0.1 oz) IBW/kg (Calculated) : 57  Temp (24hrs), Avg:99.8 F (37.7 C), Min:98.3 F (36.8 C), Max:100.7 F (38.2 C)  Recent Labs  Lab 10/08/22 1705 10/09/22 0639 10/10/22 0548 10/11/22 0558  WBC 9.0 9.8 10.3 11.0*  CREATININE 1.05* 0.86 0.85 0.72    Estimated Creatinine Clearance: 53 mL/min (by C-G formula based on SCr of 0.72 mg/dL).    No Known Allergies  Antimicrobials this admission: 6/21 Rocephin >> 6/23 6/24 Ancef >> 6/24 6/24 Cefepime >>  Microbiology results: 6/24 BCx:  6/21 UCx: kleb pneumo (R amp/nitrofurantoin   Thank you for allowing pharmacy to be a part of this patient's care.  Christoper Fabian, PharmD, BCPS Please see amion for complete clinical pharmacist phone list 10/11/2022 6:36 PM

## 2022-10-11 NOTE — Progress Notes (Signed)
Ok to optimize ceftriaxone to cefazolin to complete 5d of abx per Dr. Dartha Lodge.  Ulyses Southward, PharmD, BCIDP, AAHIVP, CPP Infectious Disease Pharmacist 10/11/2022 8:01 AM

## 2022-10-11 NOTE — TOC Progression Note (Signed)
Transition of Care [facility]) - Progression Note    Patient Details  Name: Amanda [name] MRN: [id number] Date of Birth: 06-15-1945  Transition of Care [facility]) CM/SW Contact  Zayn Selley A [place], [place] Phone Number: 10/11/2022, 3:52 PM  Clinical Narrative:    CSW met with pt and pt's husband [name] at bedside. He said he did not want any of pt's bed offers. He also thought pt was not medically stable enough so wanted to wait until she was better before making a choice. He requested CSW reach out to [employer] as an option.   The provider also informed pt's husband that she was nearing medical readiness and CSW informed him that per providers conversation with CSW, it could be as early as tomorrow.   Pt's husband understood information and said he would reconsider offers. CSW will reach back out with decision regarding bed offers and choice and update him on Danley Danker decision regarding acceptance or decline of pt.   TOC will continue to follow.    CSW met with pt and pt's husband at bedside to provide bed offers. Pt's husband stated that he wanted to review list and update CSW. CSW will follow up with regarding bed offers and choice.   TOC will continue to follow.    Expected Discharge Plan: Skilled Nursing Facility Barriers to Discharge: Continued Medical Work up  Expected Discharge Plan and Services     Post Acute Care Choice: Skilled Nursing Facility Living arrangements for the past 2 months: Single Family Home                                       Social Determinants of Health (SDOH) Interventions SDOH Screenings   Food Insecurity: No Food Insecurity (10/09/2022)  Housing: Low Risk  (09/14/2022)  Transportation Needs: No Transportation Needs (10/09/2022)  Utilities: Not At Risk (10/09/2022)  Depression (PHQ2-9): Low Risk  (09/14/2022)  Financial Resource Strain: Low Risk  (09/14/2022)  Physical Activity: Inactive (09/14/2022)  Stress: No Stress Concern Present  (09/14/2022)  Tobacco Use: Low Risk  (10/08/2022)    Readmission Risk Interventions     No data to display

## 2022-10-11 NOTE — Plan of Care (Signed)
  Problem: Coping: Goal: Ability to adjust to condition or change in health will improve Outcome: Progressing   Problem: Fluid Volume: Goal: Ability to maintain a balanced intake and output will improve Outcome: Progressing   Problem: Health Behavior/Discharge Planning: Goal: Ability to identify and utilize available resources and services will improve Outcome: Progressing   Problem: Metabolic: Goal: Ability to maintain appropriate glucose levels will improve Outcome: Progressing   Problem: Nutritional: Goal: Maintenance of adequate nutrition will improve Outcome: Progressing   Problem: Skin Integrity: Goal: Risk for impaired skin integrity will decrease Outcome: Progressing   Problem: Tissue Perfusion: Goal: Adequacy of tissue perfusion will improve Outcome: Progressing   Problem: Education: Goal: Knowledge of General Education information will improve Description: Including pain rating scale, medication(s)/side effects and non-pharmacologic comfort measures Outcome: Progressing   Problem: Health Behavior/Discharge Planning: Goal: Ability to manage health-related needs will improve Outcome: Progressing   Problem: Clinical Measurements: Goal: Ability to maintain clinical measurements within normal limits will improve Outcome: Progressing Goal: Will remain free from infection Outcome: Progressing Goal: Diagnostic test results will improve Outcome: Progressing Goal: Respiratory complications will improve Outcome: Progressing Goal: Cardiovascular complication will be avoided Outcome: Progressing

## 2022-10-11 NOTE — Plan of Care (Signed)
Patient oriented to self. Patient consumed 75% of breakfast and lunch with assistance. Patient tolerating cont. IV potassium. Patient turned Q2 hours and compliant with medication administration. VSS, will continue to monitor.  Problem: Education: Goal: Ability to describe self-care measures that may prevent or decrease complications (Diabetes Survival Skills Education) will improve Outcome: Progressing   Problem: Education: Goal: Individualized Educational Video(s) Outcome: Progressing   Problem: Coping: Goal: Ability to adjust to condition or change in health will improve Outcome: Progressing   Problem: Fluid Volume: Goal: Ability to maintain a balanced intake and output will improve Outcome: Progressing   Problem: Health Behavior/Discharge Planning: Goal: Ability to identify and utilize available resources and services will improve Outcome: Progressing   Problem: Health Behavior/Discharge Planning: Goal: Ability to manage health-related needs will improve Outcome: Progressing   Problem: Metabolic: Goal: Ability to maintain appropriate glucose levels will improve Outcome: Progressing   Problem: Nutritional: Goal: Maintenance of adequate nutrition will improve Outcome: Progressing   Problem: Nutritional: Goal: Progress toward achieving an optimal weight will improve Outcome: Progressing   Problem: Skin Integrity: Goal: Risk for impaired skin integrity will decrease Outcome: Progressing   Problem: Tissue Perfusion: Goal: Adequacy of tissue perfusion will improve Outcome: Progressing   Problem: Education: Goal: Knowledge of General Education information will improve Description: Including pain rating scale, medication(s)/side effects and non-pharmacologic comfort measures Outcome: Progressing   Problem: Health Behavior/Discharge Planning: Goal: Ability to manage health-related needs will improve Outcome: Progressing   Problem: Clinical Measurements: Goal:  Ability to maintain clinical measurements within normal limits will improve Outcome: Progressing   Problem: Clinical Measurements: Goal: Will remain free from infection Outcome: Progressing   Problem: Clinical Measurements: Goal: Diagnostic test results will improve Outcome: Progressing   Problem: Clinical Measurements: Goal: Respiratory complications will improve Outcome: Progressing   Problem: Clinical Measurements: Goal: Cardiovascular complication will be avoided Outcome: Progressing   Problem: Activity: Goal: Risk for activity intolerance will decrease Outcome: Progressing   Problem: Nutrition: Goal: Adequate nutrition will be maintained Outcome: Progressing   Problem: Coping: Goal: Level of anxiety will decrease Outcome: Progressing   Problem: Elimination: Goal: Will not experience complications related to bowel motility Outcome: Progressing   Problem: Elimination: Goal: Will not experience complications related to urinary retention Outcome: Progressing   Problem: Pain Managment: Goal: General experience of comfort will improve Outcome: Progressing   Problem: Safety: Goal: Ability to remain free from injury will improve Outcome: Progressing   Problem: Skin Integrity: Goal: Risk for impaired skin integrity will decrease Outcome: Progressing

## 2022-10-12 DIAGNOSIS — N3000 Acute cystitis without hematuria: Secondary | ICD-10-CM

## 2022-10-12 LAB — BASIC METABOLIC PANEL
Anion gap: 9 (ref 5–15)
BUN: 9 mg/dL (ref 8–23)
CO2: 20 mmol/L — ABNORMAL LOW (ref 22–32)
Calcium: 8.1 mg/dL — ABNORMAL LOW (ref 8.9–10.3)
Chloride: 113 mmol/L — ABNORMAL HIGH (ref 98–111)
Creatinine, Ser: 0.58 mg/dL (ref 0.44–1.00)
GFR, Estimated: >60 mL/min (ref >60)
Glucose, Bld: 101 mg/dL — ABNORMAL HIGH (ref 70–99)
Potassium: 3.7 mmol/L (ref 3.5–5.1)
Sodium: 142 mmol/L (ref 135–145)

## 2022-10-12 LAB — GLUCOSE, CAPILLARY
Glucose-Capillary: 87 mg/dL (ref 70–99)
Glucose-Capillary: 102 mg/dL — ABNORMAL HIGH (ref 70–99)
Glucose-Capillary: 122 mg/dL — ABNORMAL HIGH (ref 70–99)
Glucose-Capillary: 92 mg/dL (ref 70–99)

## 2022-10-12 LAB — PHOSPHORUS: Phosphorus: 2.5 mg/dL (ref 2.5–4.6)

## 2022-10-12 MED ORDER — AMOXICILLIN-POT CLAVULANATE 875-125 MG PO TABS
1.0000 | ORAL_TABLET | Freq: Two times a day (BID) | ORAL | Status: DC
  Administered 2022-10-12 – 2022-10-13 (×4): 1 via ORAL
  Filled 2022-10-12 (×4): qty 1

## 2022-10-12 NOTE — Plan of Care (Signed)

## 2022-10-12 NOTE — Care Management Important Message (Signed)
Important Message  Patient Details  Name: ELLENI MOZINGO MRN: [id number] Date of Birth: [birth year]   Medicare Important Message Given:  Yes     Lincon Sahlin Stefan Church 10/12/2022, 2:51 PM

## 2022-10-12 NOTE — Evaluation (Signed)
Clinical/Bedside Swallow Evaluation Patient Details  Name: ELIAH OZAWA MRN: [id number] Date of Birth: 1946/04/02  Today's Date: 10/12/2022 Time: SLP Start Time (ACUTE ONLY): 0831 SLP Stop Time (ACUTE ONLY): 0845 SLP Time Calculation (min) (ACUTE ONLY): 14 min  Past Medical History:  Past Medical History:  Diagnosis Date   Dementia (HCC)    Dyslipidemia    Past Surgical History: History reviewed. No pertinent surgical history. HPI:  The pt is a 77 yo female presenting 6/21 with AMS and abdominal pain. Pt found to have AKI and UTI. PMH includes: moderate-severe dementia, and dyslipidemia. CXR  Chest x-ray done today revealed right middle and lower lobe infiltrates.  Per MD note pt has difficulty taking liquids at home.    Assessment / Plan / Recommendation  Clinical Impression  Pt presents with a cognitive based oral dysphagia. She did not follow commands and verbal output mostly nonsensical. Could not completely observe oral cavity but suspect she may be missing some posterior dentition. She expectorated graham cracker but did accept one breakfast potato which she masticated for approximately 5 minutes using liquid wash to transit and clear. Mutliple sips thin liquid consumed without s/s aspiration. Applesauce consistency she was able to transit without overt delays. SLP downgraded texture to puree (Dys 1), continue thin liquids, crush meds. Suspect as cognition improves she would be able to upgrade texture. ST will continue to follow. SLP Visit Diagnosis: Dysphagia, unspecified (R13.10)    Aspiration Risk  Mild aspiration risk    Diet Recommendation Dysphagia 1 (Puree);Thin liquid   Liquid Administration via: Straw;Cup Medication Administration: Crushed with puree Supervision: Staff to assist with self feeding;Full supervision/cueing for compensatory strategies Compensations: Lingual sweep for clearance of pocketing Postural Changes: Seated upright at 90 degrees    Other   Recommendations Oral Care Recommendations: Oral care BID    Recommendations for follow up therapy are one component of a multi-disciplinary discharge planning process, led by the attending physician.  Recommendations may be updated based on patient status, additional functional criteria and insurance authorization.  Follow up Recommendations  (TBD)      Assistance Recommended at Discharge    Functional Status Assessment Patient has had a recent decline in their functional status and demonstrates the ability to make significant improvements in function in a reasonable and predictable amount of time.  Frequency and Duration min 2x/week  2 weeks       Prognosis Prognosis for improved oropharyngeal function:  (fair-good) Barriers to Reach Goals: Cognitive deficits      Swallow Study   General HPI: The pt is a 76 yo female presenting 6/21 with AMS and abdominal pain. Pt found to have AKI and UTI. PMH includes: moderate-severe dementia, and dyslipidemia. CXR  Chest x-ray done today revealed right middle and lower lobe infiltrates.  Per MD note pt has difficulty taking liquids at home. Type of Study: Bedside Swallow Evaluation Previous Swallow Assessment:  (none) Diet Prior to this Study: Regular;Thin liquids (Level 0) Temperature Spikes Noted: No Respiratory Status: Room air History of Recent Intubation: No Behavior/Cognition: Alert;Requires cueing Oral Cavity Assessment: Within Functional Limits Oral Care Completed by SLP: No Oral Cavity - Dentition:  (pt would not open fully- may be missing some) Vision: Functional for self-feeding Self-Feeding Abilities: Needs assist Patient Positioning: Upright in bed Baseline Vocal Quality: Normal Volitional Cough: Cognitively unable to elicit Volitional Swallow: Unable to elicit    Oral/Motor/Sensory Function Overall Oral Motor/Sensory Function:  (pt would not follow commands)   [place]  chips: Not tested   Thin Liquid Thin Liquid:  Within functional limits Presentation: Cup;Straw    Nectar Thick Nectar Thick Liquid: Not tested   Honey Thick Honey Thick Liquid: Not tested   Puree Puree: Within functional limits   Solid     Solid: Impaired Oral Phase Impairments: Reduced lingual movement/coordination;Poor awareness of bolus Oral Phase Functional Implications: Prolonged oral transit (prolonged mastication)      Roque Cash, Breck Coons 10/12/2022,9:02 AM

## 2022-10-12 NOTE — Progress Notes (Addendum)
PROGRESS NOTE  Amanda [name]  UUV:[id number] DOB: 05-31-1945 DOA: 10/08/2022 PCP: [name], MD   Brief Narrative: Patient is a 77 year old female with history of moderate to severe dementia, hyperlipidemia who initially presented with abdominal pain, fever.  Workup revealed right-sided pneumonia, UTI.  Urine culture grew Klebsiella pneumoniae. CXR  also showed right-sided pneumonia, currently hemodynamically stable. PT/recommending skilled nursing facility on discharge.Medically stable for dc whenever possible  Assessment & Plan:  Principal Problem:   UTI (urinary tract infection) Active Problems:   AMS (altered mental status)   Urinary tract infection   AKI (acute kidney injury) (HCC)   Altered mental status   Altered mental status/history of dementia: Patient follows with neurology for dementia as an outpatient.  Lives with her husband.  At baseline, confused to time and location.  Altered mental status was thought to be secondary to UTI, AKI on presentation .CT head did not show any acute intracranial abnormalities.  MRI was attempted but she could not tolerate despite premedication.  No focal neurological deficits.  Continue delirium precautions.Mentation at baseline now  AKI: Resolved  Community-acquired pneumonia: Chest x-ray showed right middle and right lower lobe infiltrate.  She was on cefepime. She is currently on room air.  Respiratory status stable.No cough or SOB.  Has few crackles on right lung. Abx changed to augementin  UTI: Urine culture shows Klebsiella.  Continue current antibiotics.  Afebrile this morning.  Deconditioning/debility: Patient seen by PT/OT and recommended SNF on discharge.AT baseline, patient is ambulatory without any problem.  Lives at home with husband.  Climbs stairs      DVT prophylaxis:enoxaparin (LOVENOX) injection 40 mg Start: 10/09/22 0800 SCDs Start: 10/08/22 2355     Code Status: Full Code  Family Communication:    Patient status:Inpatient  Patient is from :Home  Anticipated discharge to:SNF  Estimated DC date:1-2 days   Consultants: None  Procedures:None  Antimicrobials:  Anti-infectives (From admission, onward)    Start     Dose/Rate Route Frequency Ordered Stop   10/11/22 1930  ceFEPIme (MAXIPIME) 2 g in sodium chloride 0.9 % 100 mL IVPB        2 g 200 mL/hr over 30 Minutes Intravenous Every 12 hours 10/11/22 1838     10/11/22 1800  ceFAZolin (ANCEF) IVPB 1 g/50 mL premix  Status:  Discontinued        1 g 100 mL/hr over 30 Minutes Intravenous Every 8 hours 10/11/22 0800 10/11/22 1834   10/10/22 2100  cefTRIAXone (ROCEPHIN) 1 g in sodium chloride 0.9 % 100 mL IVPB  Status:  Discontinued        1 g 200 mL/hr over 30 Minutes Intravenous Every 24 hours 10/10/22 1537 10/11/22 0800   10/09/22 2200  cefTRIAXone (ROCEPHIN) 2 g in sodium chloride 0.9 % 100 mL IVPB        2 g 200 mL/hr over 30 Minutes Intravenous Every 24 hours 10/08/22 2316 10/10/22 0755   10/09/22 0000  cefTRIAXone (ROCEPHIN) 2 g in sodium chloride 0.9 % 100 mL IVPB  Status:  Discontinued        2 g 200 mL/hr over 30 Minutes Intravenous Every 24 hours 10/08/22 2229 10/08/22 2316   10/08/22 2145  cefTRIAXone (ROCEPHIN) 2 g in sodium chloride 0.9 % 100 mL IVPB        2 g 200 mL/hr over 30 Minutes Intravenous  Once 10/08/22 2138 10/08/22 2231       Subjective: Patient seen and examined at the bedside  today.  Husband also at bedside.  She appeared comfortable.  She was on room air.  Denies any abdomen pain, nausea or vomiting.  Confused to time.  Not in any current distress  Objective: Vitals:   10/11/22 1948 10/12/22 0100 10/12/22 0143 10/12/22 0910  BP: (!) 105/47  (!) 121/49 (!) 129/58  Pulse: 76  74 67  Resp: 17  17   Temp: 98.7 F (37.1 C)  98.2 F (36.8 C)   TempSrc:      SpO2: 98%  98% 100%  Weight:  61.8 kg    Height:        Intake/Output Summary (Last 24 hours) at 10/12/2022 1102 Last data filed at  10/12/2022 0451 Gross per 24 hour  Intake 1330.44 ml  Output 150 ml  Net 1180.44 ml   Filed Weights   10/08/22 2134 10/12/22 0100  Weight: 62.6 kg 61.8 kg    Examination:  General exam: Overall comfortable, not in distress HEENT: PERRL Respiratory system: Few crackles on right base Cardiovascular system: S1 & S2 heard, RRR.  Gastrointestinal system: Abdomen is nondistended, soft and nontender. Central nervous system: Alert, awake but underwent Extremities: No edema, no clubbing ,no cyanosis Skin: No rashes, no ulcers,no icterus     Data Reviewed: I have personally reviewed following labs and imaging studies  CBC: Recent Labs  Lab 10/08/22 1705 10/09/22 0639 10/10/22 0548 10/11/22 0558  WBC 9.0 9.8 10.3 11.0*  NEUTROABS 7.4  --   --  8.9*  HGB 14.0 12.1 11.8* 10.8*  HCT 42.3 37.8 34.9* 33.7*  MCV 87.0 88.1 86.4 88.9  PLT 165 142* 164 205   Basic Metabolic Panel: Recent Labs  Lab 10/08/22 1705 10/09/22 0639 10/10/22 0548 10/11/22 0558 10/12/22 0756  NA 138 143 144 143 142  K 4.5 4.0 3.2* 3.6 3.7  CL 101 107 111 112* 113*  CO2 25 23 23  21* 20*  GLUCOSE 177* 118* 121* 113* 101*  BUN 22 15 14 10 9   CREATININE 1.05* 0.86 0.85 0.72 0.58  CALCIUM 9.3 8.9 8.5* 8.1* 8.1*  MG  --   --   --  1.9  --   PHOS  --   --   --  2.0* 2.5     Recent Results (from the past 240 hour(s))  Urine Culture     Status: Abnormal   Collection Time: 10/08/22  9:38 PM   Specimen: Urine, Clean Catch  Result Value Ref Range Status   Specimen Description URINE, CLEAN CATCH  Final   Special Requests   Final    NONE Performed at [facility], [address] [place], [place] [address]    Culture 50,000 COLONIES/mL KLEBSIELLA PNEUMONIAE (A)  Final   Report Status 10/10/2022 FINAL  Final   Organism ID, Bacteria KLEBSIELLA PNEUMONIAE (A)  Final      Susceptibility   Klebsiella pneumoniae - MIC*    AMPICILLIN RESISTANT Resistant     CEFAZOLIN <=4 SENSITIVE Sensitive      CEFEPIME <=0.12 SENSITIVE Sensitive     CEFTRIAXONE <=0.25 SENSITIVE Sensitive     CIPROFLOXACIN <=0.25 SENSITIVE Sensitive     GENTAMICIN <=1 SENSITIVE Sensitive     IMIPENEM <=0.25 SENSITIVE Sensitive     NITROFURANTOIN 128 RESISTANT Resistant     TRIMETH/SULFA <=20 SENSITIVE Sensitive     AMPICILLIN/SULBACTAM 4 SENSITIVE Sensitive     PIP/TAZO <=4 SENSITIVE Sensitive     * 50,000 COLONIES/mL KLEBSIELLA PNEUMONIAE  Culture, blood (Routine X 2)  w Reflex to ID Panel     Status: None (Preliminary result)   Collection Time: 10/11/22  3:46 PM   Specimen: BLOOD LEFT HAND  Result Value Ref Range Status   Specimen Description BLOOD LEFT HAND  Final   Special Requests   Final    BOTTLES DRAWN AEROBIC AND ANAEROBIC Blood Culture adequate volume   Culture   Final    NO GROWTH < 24 HOURS Performed at [facility], [address] [place], [place] [address]    Report Status PENDING  Incomplete  Culture, blood (Routine X 2) w Reflex to ID Panel     Status: None (Preliminary result)   Collection Time: 10/11/22  3:56 PM   Specimen: BLOOD  Result Value Ref Range Status   Specimen Description BLOOD LEFT ANTECUBITAL  Final   Special Requests   Final    BOTTLES DRAWN AEROBIC AND ANAEROBIC Blood Culture adequate volume   Culture   Final    NO GROWTH < 24 HOURS Performed at [facility], [address] [place], [place] [address]    Report Status PENDING  Incomplete     Radiology Studies: DG CHEST PORT 1 VIEW  Result Date: 10/11/2022 CLINICAL DATA:  Fever. EXAM: PORTABLE CHEST 1 VIEW COMPARISON:  One-view chest x-ray 03/26/2017 FINDINGS: Heart is mildly enlarged. Atherosclerotic calcifications are present at the aortic arch. Right middle and lower lobe airspace opacity is new. Right pleural effusion is suspected. The left lung is clear. The visualized soft tissues and bony thorax are unremarkable. IMPRESSION: 1. New right middle and lower lobe airspace disease consistent with  pneumonia. 2. Probable right pleural effusion. Electronically Signed   By: Marin Roberts M.D.   On: 10/11/2022 16:26    Scheduled Meds:  acetaminophen  650 mg Rectal Q4H   enoxaparin (LOVENOX) injection  40 mg Subcutaneous Q24H   insulin aspart  0-15 Units Subcutaneous TID WC   insulin aspart  0-5 Units Subcutaneous QHS   polyethylene glycol  17 g Oral BID   sodium chloride flush  3 mL Intravenous Q12H   Continuous Infusions:  0.9 % NaCl with KCl 20 mEq / L 50 mL/hr at 10/12/22 0451   ceFEPime (MAXIPIME) IV 2 g (10/12/22 0815)     LOS: 3 days   Burnadette Pop, MD [facility] P6/25/2024, 11:02 AM

## 2022-10-12 NOTE — Progress Notes (Signed)
Physical Therapy Treatment Patient Details Name: Amanda [name] MRN: [id number] DOB: 1945-10-29 Today's Date: 10/12/2022   History of Present Illness The pt is a 77 yo female presenting 6/21 with AMS and abdominal pain. Pt found to have AKI and UTI. PMH includes: moderate-severe dementia, and dyslipidemia.    PT Comments    Pt awake upon PT arrival, per pt's husband at bedside pt just woke up from afternoon nap. Pt more engaged during PT session today, especially when given multimodal cuing to understand task at hand. Pt remains resistant to attempt transfer into standing, x2 attempts and only able to partially rise given pt resistance. Pt overall requiring mod-max assist for mobility at this time, does sit EOB unsupported x10 minutes today. PT to continue to follow, plan remains appropriate.      Recommendations for follow up therapy are one component of a multi-disciplinary discharge planning process, led by the attending physician.  Recommendations may be updated based on patient status, additional functional criteria and insurance authorization.  Follow Up Recommendations  Can patient physically be transported by private vehicle: No    Assistance Recommended at Discharge Frequent or constant Supervision/Assistance  Patient can return home with the following Two people to help with bathing/dressing/bathroom;Assistance with feeding;Assist for transportation;Help with stairs or ramp for entrance;Two people to help with walking and/or transfers   Equipment Recommendations  None recommended by PT    Recommendations for Other Services       Precautions / Restrictions Precautions Precautions: Fall Restrictions Weight Bearing Restrictions: No     Mobility  Bed Mobility Overal bed mobility: Needs Assistance Bed Mobility: Supine to Sit, Sit to Supine     Supine to sit: Min assist, HOB elevated Sit to supine: Mod assist, HOB elevated   General bed mobility comments: assist  to progress LEs to/from EOB, once pt understands task pt engages and elevates/lowers trunk. boost assist with return to supine    Transfers Overall transfer level: Needs assistance Equipment used: 2 person hand held assist Transfers: Sit to/from Stand Sit to Stand: Max assist, +2 physical assistance           General transfer comment: assist for rise and steady, LE blocking as pt's feet sliding anteriorly. Attempts to stand x2, pt resisting once rising into stand both times even with husband assisting too.    Ambulation/Gait                   Stairs             Wheelchair Mobility    Modified Rankin (Stroke Patients Only)       Balance Overall balance assessment: Needs assistance Sitting-balance support: No upper extremity supported, Feet supported Sitting balance-Leahy Scale: Fair     Standing balance support: During functional activity, Bilateral upper extremity supported Standing balance-Leahy Scale: Zero Standing balance comment: max +2 to get to semi-standing                            Cognition Arousal/Alertness: Awake/alert Behavior During Therapy: Flat affect Overall Cognitive Status: History of cognitive impairments - at baseline                                 General Comments: history of dementia. Pt clearly stating no and yes during session, otherwise pt just repeating back what PT says with some variation or speaking nonsensically.  Pt restless during session, per husband she was napping prior to PT arrival        Exercises      General Comments        Pertinent Vitals/Pain Pain Assessment Pain Assessment: No/denies pain Pain Intervention(s): Monitored during session    Home Living                          Prior Function            PT Goals (current goals can now be found in the care plan section) Acute Rehab PT Goals Patient Stated Goal: none stated PT Goal Formulation: Patient unable  to participate in goal setting Time For Goal Achievement: 10/23/22 Potential to Achieve Goals: Fair Progress towards PT goals: Progressing toward goals    Frequency    Min 3X/week      PT Plan Current plan remains appropriate    Co-evaluation              AM-PAC PT "6 Clicks" Mobility   Outcome Measure  Help needed turning from your back to your side while in a flat bed without using bedrails?: A Lot Help needed moving from lying on your back to sitting on the side of a flat bed without using bedrails?: A Lot Help needed moving to and from a bed to a chair (including a wheelchair)?: Total Help needed standing up from a chair using your arms (e.g., wheelchair or bedside chair)?: Total Help needed to walk in hospital room?: Total Help needed climbing 3-5 steps with a railing? : Total 6 Click Score: 8    End of Session   Activity Tolerance: Patient tolerated treatment well Patient left: in bed;with call bell/phone within reach;with bed alarm set;with family/visitor present Nurse Communication: Mobility status PT Visit Diagnosis: Other abnormalities of gait and mobility (R26.89);Muscle weakness (generalized) (M62.81)     Time: 6045-4098 PT Time Calculation (min) (ACUTE ONLY): 19 min  Charges:  $Therapeutic Activity: 8-22 mins                     Marye Round, PT DPT Acute Rehabilitation Services Secure Chat Preferred  Office [phone]    Myeisha Kruser E Christain [place] 10/12/2022, 3:52 PM

## 2022-10-13 ENCOUNTER — Inpatient Hospital Stay (HOSPITAL_COMMUNITY): Payer: PPO

## 2022-10-13 DIAGNOSIS — N3000 Acute cystitis without hematuria: Secondary | ICD-10-CM | POA: Diagnosis not present

## 2022-10-13 LAB — BASIC METABOLIC PANEL
Anion gap: 12 (ref 5–15)
BUN: 10 mg/dL (ref 8–23)
CO2: 22 mmol/L (ref 22–32)
Calcium: 8.3 mg/dL — ABNORMAL LOW (ref 8.9–10.3)
Chloride: 107 mmol/L (ref 98–111)
Creatinine, Ser: 0.67 mg/dL (ref 0.44–1.00)
GFR, Estimated: >60 mL/min (ref >60)
Glucose, Bld: 95 mg/dL (ref 70–99)
Potassium: 3.6 mmol/L (ref 3.5–5.1)
Sodium: 141 mmol/L (ref 135–145)

## 2022-10-13 LAB — CBC
HCT: 31.4 % — ABNORMAL LOW (ref 36.0–46.0)
Hemoglobin: 10.2 g/dL — ABNORMAL LOW (ref 12.0–15.0)
MCH: 28 pg (ref 26.0–34.0)
MCHC: 32.5 g/dL (ref 30.0–36.0)
MCV: 86.3 fL (ref 80.0–100.0)
Platelets: 265 10*3/uL (ref 150–400)
RBC: 3.64 MIL/uL — ABNORMAL LOW (ref 3.87–5.11)
RDW: 13.2 % (ref 11.5–15.5)
WBC: 8.5 10*3/uL (ref 4.0–10.5)
nRBC: 0 % (ref 0.0–0.2)

## 2022-10-13 LAB — CULTURE, BLOOD (ROUTINE X 2): Special Requests: ADEQUATE

## 2022-10-13 LAB — GLUCOSE, CAPILLARY
Glucose-Capillary: 81 mg/dL (ref 70–99)
Glucose-Capillary: 112 mg/dL — ABNORMAL HIGH (ref 70–99)
Glucose-Capillary: 78 mg/dL (ref 70–99)
Glucose-Capillary: 83 mg/dL (ref 70–99)

## 2022-10-13 MED ORDER — ACETAMINOPHEN 325 MG PO TABS
650.0000 mg | ORAL_TABLET | Freq: Four times a day (QID) | ORAL | Status: DC | PRN
  Administered 2022-10-13 – 2022-10-21 (×2): 650 mg via ORAL
  Filled 2022-10-13 (×3): qty 2

## 2022-10-13 NOTE — TOC Progression Note (Signed)
Transition of Care [facility]) - Progression Note    Patient Details  Name: Amanda [name] MRN: [id number] Date of Birth: September 19, 1945  Transition of Care [facility]) CM/SW Contact  Elza Varricchio A [place], [place] Phone Number: 10/13/2022, 1:26 PM  Clinical Narrative:     CSW met with pt's husband to discuss bed offers as pt is still too confused and disoriented.  He said that CSW could provide him with contact information so that he could visit [facility].  CSW also reached out to [facility] who stated they had semi private rooms available and would look at the referral CSW sent over.   She said she would reach out to pt's husband and inform him of the facility and potential admission. She will reach out to CSW regarding decision for bed offer.   TOC will continue to follow.   Expected Discharge Plan: Skilled Nursing Facility Barriers to Discharge: Continued Medical Work up  Expected Discharge Plan and Services     Post Acute Care Choice: Skilled Nursing Facility Living arrangements for the past 2 months: Single Family Home                                       Social Determinants of Health (SDOH) Interventions SDOH Screenings   Food Insecurity: No Food Insecurity (10/09/2022)  Housing: Low Risk  (09/14/2022)  Transportation Needs: No Transportation Needs (10/09/2022)  Utilities: Not At Risk (10/09/2022)  Depression (PHQ2-9): Low Risk  (09/14/2022)  Financial Resource Strain: Low Risk  (09/14/2022)  Physical Activity: Inactive (09/14/2022)  Stress: No Stress Concern Present (09/14/2022)  Tobacco Use: Low Risk  (10/08/2022)    Readmission Risk Interventions     No data to display

## 2022-10-13 NOTE — TOC Progression Note (Addendum)
Transition of Care [facility]) - Progression Note    Patient Details  Name: Amanda [name] MRN: [id number] Date of Birth: 01/01/1946  Transition of Care [facility]) CM/SW Contact  Tomica Arseneault A [place], [place] Phone Number: 10/13/2022, 4:40 PM  Clinical Narrative:     CSW met with pt's husband. CSW met with pt's husband regarding decision for SNF. He said that he visited [facility] [address] and tentatively accepted. Possible DC Monday.   TOC will continue to follow.   CSW met with pt's husband regarding decision for SNF. He was unable to visit facility[facility] and plan to visit tomorrow. He will update CSW regarding plan once place is confirmed.   TOC will continue to follow.   CSW met with pt's husband and pt at bedside. He said he is touring [facility] and [facility] and would update CSW in the afternoon regarding decision of SNF.   TOC will continue to follow.    Expected Discharge Plan: Skilled Nursing Facility Barriers to Discharge: Continued Medical Work up  Expected Discharge Plan and Services     Post Acute Care Choice: Skilled Nursing Facility Living arrangements for the past 2 months: Single Family Home                                       Social Determinants of Health (SDOH) Interventions SDOH Screenings   Food Insecurity: No Food Insecurity (10/09/2022)  Housing: Low Risk  (09/14/2022)  Transportation Needs: No Transportation Needs (10/09/2022)  Utilities: Not At Risk (10/09/2022)  Depression (PHQ2-9): Low Risk  (09/14/2022)  Financial Resource Strain: Low Risk  (09/14/2022)  Physical Activity: Inactive (09/14/2022)  Stress: No Stress Concern Present (09/14/2022)  Tobacco Use: Low Risk  (10/08/2022)    Readmission Risk Interventions     No data to display

## 2022-10-13 NOTE — Progress Notes (Addendum)
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  Workup revealed right-sided pneumonia, UTI.  Urine culture grew Klebsiella pneumoniae. CXR  also showed right-sided pneumonia, currently hemodynamically stable. PT/recommending skilled nursing facility on discharge.Medically stable for dc whenever possible  Assessment & Plan:  Principal Problem:   UTI (urinary tract infection) Active Problems:   AMS (altered mental status)   Urinary tract infection   AKI (acute kidney injury) (HCC)   Altered mental status   Altered mental status/history of dementia: Patient follows with neurology for dementia as an outpatient.  Lives with her husband.  At baseline, confused to time and location.  Altered mental status was thought to be secondary to UTI, AKI on presentation .CT head did not show any acute intracranial abnormalities.  MRI was attempted but she could not tolerate despite premedication.  No focal neurological deficits.  Continue delirium precautions.Mentation at baseline now  AKI: Resolved  Community-acquired pneumonia: Chest x-ray showed right middle and right lower lobe infiltrate.  She was on cefepime. She is currently on room air.  Respiratory status stable.No cough or SOB.  Had few crackles on right lung. Abx changed to augmentin  UTI: Urine culture shows Klebsiella.  Continue current antibiotics.  Afebrile this morning.  Deconditioning/debility: Patient seen by PT/OT and recommended SNF on discharge.AT baseline, patient is ambulatory without any problem.  Lives at home with husband.  Climbs stairs      DVT prophylaxis:enoxaparin (LOVENOX) injection 40 mg Start: 10/09/22 0800 SCDs Start: 10/08/22 2355     Code Status: Full Code  Family Communication: Discussed  with husband and daughter on phone on 6/26  Patient status:Inpatient  Patient is from :Home  Anticipated discharge to:SNF  Estimated DC date:1-2 days   Consultants: None  Procedures:None  Antimicrobials:  Anti-infectives (From admission, onward)    Start     Dose/Rate Route Frequency Ordered Stop   10/12/22 1200  amoxicillin-clavulanate (AUGMENTIN) 875-125 MG per tablet 1 tablet        1 tablet Oral Every 12 hours 10/12/22 1107 10/16/22 0959   10/11/22 1930  ceFEPIme (MAXIPIME) 2 g in sodium chloride 0.9 % 100 mL IVPB  Status:  Discontinued        2 g 200 mL/hr over 30 Minutes Intravenous Every 12 hours 10/11/22 1838 10/12/22 1107   10/11/22 1800  ceFAZolin (ANCEF) IVPB 1 g/50 mL premix  Status:  Discontinued        1 g 100 mL/hr over 30 Minutes Intravenous Every 8 hours 10/11/22 0800 10/11/22 1834   10/10/22 2100  cefTRIAXone (ROCEPHIN) 1 g in sodium chloride 0.9 % 100 mL IVPB  Status:  Discontinued        1 g 200 mL/hr over 30 Minutes Intravenous Every 24 hours 10/10/22 1537 10/11/22 0800   10/09/22 2200  cefTRIAXone (ROCEPHIN) 2 g in sodium chloride 0.9 % 100 mL IVPB        2 g 200 mL/hr over 30 Minutes Intravenous Every 24 hours 10/08/22 2316 10/10/22 0755   10/09/22 0000  cefTRIAXone (ROCEPHIN) 2 g in sodium chloride 0.9 % 100 mL IVPB  Status:  Discontinued        2 g 200 mL/hr over 30 Minutes Intravenous Every 24 hours 10/08/22 2229 10/08/22 2316   10/08/22 2145  cefTRIAXone (ROCEPHIN) 2 g in  sodium chloride 0.9 % 100 mL IVPB        2 g 200 mL/hr over 30 Minutes Intravenous  Once 10/08/22 2138 10/08/22 2231       Subjective: Patient seen and examined at bedside today.  Hemodynamically stable.  Looks comfortable.  On room air.  Remains confused.  Obeys commands.  Breakfast tray on the bedside which was untouched   Objective: Vitals:   10/12/22 1553 10/12/22 1943 10/13/22 0445 10/13/22 0740  BP: (!) 123/50 (!) 119/58 (!) 127/50 (!) 129/56  Pulse: 67 62 64 66   Resp:  17 17 17   Temp: 98.3 F (36.8 C) 99.8 F (37.7 C) 98.3 F (36.8 C) 98.2 F (36.8 C)  TempSrc:    Oral  SpO2: 100% 100% 98% 98%  Weight:      Height:        Intake/Output Summary (Last 24 hours) at 10/13/2022 1329 Last data filed at 10/13/2022 0600 Gross per 24 hour  Intake --  Output 520 ml  Net -520 ml   Filed Weights   10/08/22 2134 10/12/22 0100  Weight: 62.6 kg 61.8 kg    Examination:  General exam: Overall comfortable, not in distress HEENT: PERRL Respiratory system:  no wheezes or crackles  Cardiovascular system: S1 & S2 heard, RRR.  Gastrointestinal system: Abdomen is nondistended, soft and nontender. Central nervous system: Alert and awake but not oriented Extremities: No edema, no clubbing ,no cyanosis Skin: No rashes, no ulcers,no icterus     Data Reviewed: I have personally reviewed following labs and imaging studies  CBC: Recent Labs  Lab 10/08/22 1705 10/09/22 0639 10/10/22 0548 10/11/22 0558 10/13/22 0440  WBC 9.0 9.8 10.3 11.0* 8.5  NEUTROABS 7.4  --   --  8.9*  --   HGB 14.0 12.1 11.8* 10.8* 10.2*  HCT 42.3 37.8 34.9* 33.7* 31.4*  MCV 87.0 88.1 86.4 88.9 86.3  PLT 165 142* 164 205 265   Basic Metabolic Panel: Recent Labs  Lab 10/09/22 0639 10/10/22 0548 10/11/22 0558 10/12/22 0756 10/13/22 0440  NA 143 144 143 142 141  K 4.0 3.2* 3.6 3.7 3.6  CL 107 111 112* 113* 107  CO2 23 23 21* 20* 22  GLUCOSE 118* 121* 113* 101* 95  BUN 15 14 10 9 10   CREATININE 0.86 0.85 0.72 0.58 0.67  CALCIUM 8.9 8.5* 8.1* 8.1* 8.3*  MG  --   --  1.9  --   --   PHOS  --   --  2.0* 2.5  --      Recent Results (from the past 240 hour(s))  Urine Culture     Status: Abnormal   Collection Time: 10/08/22  9:38 PM   Specimen: Urine, Clean Catch  Result Value Ref Range Status   Specimen Description URINE, CLEAN CATCH  Final   Special Requests   Final    NONE Performed at [facility], [address] [place], [place] [address]    Culture  50,000 COLONIES/mL KLEBSIELLA PNEUMONIAE (A)  Final   Report Status 10/10/2022 FINAL  Final   Organism ID, Bacteria KLEBSIELLA PNEUMONIAE (A)  Final      Susceptibility   Klebsiella pneumoniae - MIC*    AMPICILLIN RESISTANT Resistant     CEFAZOLIN <=4 SENSITIVE Sensitive     CEFEPIME <=0.12 SENSITIVE Sensitive     CEFTRIAXONE <=0.25 SENSITIVE Sensitive     CIPROFLOXACIN <=0.25 SENSITIVE Sensitive     GENTAMICIN <=1 SENSITIVE Sensitive  IMIPENEM <=0.25 SENSITIVE Sensitive     NITROFURANTOIN 128 RESISTANT Resistant     TRIMETH/SULFA <=20 SENSITIVE Sensitive     AMPICILLIN/SULBACTAM 4 SENSITIVE Sensitive     PIP/TAZO <=4 SENSITIVE Sensitive     * 50,000 COLONIES/mL KLEBSIELLA PNEUMONIAE  Culture, blood (Routine X 2) w Reflex to ID Panel     Status: None (Preliminary result)   Collection Time: 10/11/22  3:46 PM   Specimen: BLOOD LEFT HAND  Result Value Ref Range Status   Specimen Description BLOOD LEFT HAND  Final   Special Requests   Final    BOTTLES DRAWN AEROBIC AND ANAEROBIC Blood Culture adequate volume   Culture   Final    NO GROWTH 2 DAYS Performed at [facility], [address] [place], [place] [address]    Report Status PENDING  Incomplete  Culture, blood (Routine X 2) w Reflex to ID Panel     Status: None (Preliminary result)   Collection Time: 10/11/22  3:56 PM   Specimen: BLOOD  Result Value Ref Range Status   Specimen Description BLOOD LEFT ANTECUBITAL  Final   Special Requests   Final    BOTTLES DRAWN AEROBIC AND ANAEROBIC Blood Culture adequate volume   Culture   Final    NO GROWTH 2 DAYS Performed at [facility], [address], [place], [place] [address]    Report Status PENDING  Incomplete     Radiology Studies: DG CHEST PORT 1 VIEW  Result Date: 10/11/2022 CLINICAL DATA:  Fever. EXAM: PORTABLE CHEST 1 VIEW COMPARISON:  One-view chest x-ray 03/26/2017 FINDINGS: Heart is mildly enlarged. Atherosclerotic calcifications are present at the  aortic arch. Right middle and lower lobe airspace opacity is new. Right pleural effusion is suspected. The left lung is clear. The visualized soft tissues and bony thorax are unremarkable. IMPRESSION: 1. New right middle and lower lobe airspace disease consistent with pneumonia. 2. Probable right pleural effusion. Electronically Signed   By: Marin Roberts M.D.   On: 10/11/2022 16:26    Scheduled Meds:  amoxicillin-clavulanate  1 tablet Oral Q12H   enoxaparin (LOVENOX) injection  40 mg Subcutaneous Q24H   insulin aspart  0-15 Units Subcutaneous TID WC   insulin aspart  0-5 Units Subcutaneous QHS   polyethylene glycol  17 g Oral BID   sodium chloride flush  3 mL Intravenous Q12H   Continuous Infusions:     LOS: 4 days   Burnadette Pop, MD [facility] P6/26/2024, 1:29 PM

## 2022-10-14 DIAGNOSIS — N3000 Acute cystitis without hematuria: Secondary | ICD-10-CM | POA: Diagnosis not present

## 2022-10-14 LAB — BASIC METABOLIC PANEL
Anion gap: 9 (ref 5–15)
BUN: 8 mg/dL (ref 8–23)
CO2: 22 mmol/L (ref 22–32)
Calcium: 8.1 mg/dL — ABNORMAL LOW (ref 8.9–10.3)
Chloride: 105 mmol/L (ref 98–111)
Creatinine, Ser: 0.68 mg/dL (ref 0.44–1.00)
GFR, Estimated: >60 mL/min (ref >60)
Glucose, Bld: 99 mg/dL (ref 70–99)
Potassium: 3.3 mmol/L — ABNORMAL LOW (ref 3.5–5.1)
Sodium: 136 mmol/L (ref 135–145)

## 2022-10-14 LAB — CBC
HCT: 32.1 % — ABNORMAL LOW (ref 36.0–46.0)
Hemoglobin: 10.4 g/dL — ABNORMAL LOW (ref 12.0–15.0)
MCH: 28.1 pg (ref 26.0–34.0)
MCHC: 32.4 g/dL (ref 30.0–36.0)
MCV: 86.8 fL (ref 80.0–100.0)
Platelets: 301 10*3/uL (ref 150–400)
RBC: 3.7 MIL/uL — ABNORMAL LOW (ref 3.87–5.11)
RDW: 13.2 % (ref 11.5–15.5)
WBC: 8.2 10*3/uL (ref 4.0–10.5)
nRBC: 0 % (ref 0.0–0.2)

## 2022-10-14 LAB — CULTURE, BLOOD (ROUTINE X 2): Special Requests: ADEQUATE

## 2022-10-14 LAB — GLUCOSE, CAPILLARY
Glucose-Capillary: 82 mg/dL (ref 70–99)
Glucose-Capillary: 92 mg/dL (ref 70–99)
Glucose-Capillary: 77 mg/dL (ref 70–99)
Glucose-Capillary: 84 mg/dL (ref 70–99)

## 2022-10-14 MED ORDER — SODIUM CHLORIDE 0.9 % IV SOLN
3.0000 g | Freq: Four times a day (QID) | INTRAVENOUS | Status: AC
  Administered 2022-10-14 – 2022-10-16 (×11): 3 g via INTRAVENOUS
  Filled 2022-10-14 (×11): qty 8

## 2022-10-14 MED ORDER — POTASSIUM CHLORIDE 20 MEQ PO PACK
40.0000 meq | PACK | Freq: Once | ORAL | Status: AC
  Administered 2022-10-14: 40 meq via ORAL
  Filled 2022-10-14: qty 2

## 2022-10-14 MED ORDER — ENSURE ENLIVE PO LIQD
237.0000 mL | Freq: Two times a day (BID) | ORAL | Status: DC
  Administered 2022-10-14 – 2022-10-25 (×13): 237 mL via ORAL
  Filled 2022-10-14: qty 237

## 2022-10-14 NOTE — Progress Notes (Signed)
Occupational Therapy Treatment Patient Details Name: Amanda [name] MRN: [id number] DOB: [birth year] Today's Date: 10/14/2022   History of present illness The pt is a 77 yo female presenting 6/21 with AMS and abdominal pain. Pt found to have AKI and UTI. PMH includes: moderate-severe dementia, and dyslipidemia.   OT comments  Pt progressing toward established OT goals. RN reporting MD would like pt to get OOB. Facilitating socialization and interpersonal relationships with pt EOB with husband standing in front of her and holding hands dancing. Pt in good spirits, smiling and actively engaged in dancing sitting EOB to R&B music. Pt performing step pivot transfer to chair with max A (requiring 3 attempts due to pt prematurely extending legs requiring BLE foot block) for rise and mod A +2 for steps to chair. Chair alarm on and husband to communicate with nursing staff before leaving the room if not transferred back to chair before leaving. Will continue to follow.    Recommendations for follow up therapy are one component of a multi-disciplinary discharge planning process, led by the attending physician.  Recommendations may be updated based on patient status, additional functional criteria and insurance authorization.    Assistance Recommended at Discharge Frequent or constant Supervision/Assistance  Patient can return home with the following  Two people to help with walking and/or transfers;A lot of help with bathing/dressing/bathroom;Assistance with cooking/housework;Direct supervision/assist for medications management;Direct supervision/assist for financial management;Assist for transportation;Help with stairs or ramp for entrance;Assistance with feeding   Equipment Recommendations  Other (comment) (defer)    Recommendations for Other Services      Precautions / Restrictions Precautions Precautions: Fall Restrictions Weight Bearing Restrictions: No       Mobility Bed  Mobility Overal bed mobility: Needs Assistance Bed Mobility: Supine to Sit, Sit to Supine     Supine to sit: Mod assist, HOB elevated Sit to supine: Max assist        Transfers Overall transfer level: Needs assistance Equipment used: 2 person hand held assist Transfers: Sit to/from Stand, Bed to chair/wheelchair/BSC Sit to Stand: Max assist, +2 physical assistance     Step pivot transfers: Mod assist, +2 physical assistance, +2 safety/equipment           Balance Overall balance assessment: Needs assistance Sitting-balance support: No upper extremity supported, Feet supported Sitting balance-Leahy Scale: Fair Sitting balance - Comments: min guard A EOB   Standing balance support: During functional activity, Bilateral upper extremity supported Standing balance-Leahy Scale: Zero Standing balance comment: mod+2 to maintain upright                           ADL either performed or assessed with clinical judgement   ADL Overall ADL's : Needs assistance/impaired                         Toilet Transfer: Maximal assistance;+2 for physical assistance;+2 for safety/equipment;Stand-pivot                  Extremity/Trunk Assessment Upper Extremity Assessment Upper Extremity Assessment: Generalized weakness   Lower Extremity Assessment Lower Extremity Assessment: Generalized weakness        Vision   Vision Assessment?: Vision impaired- to be further tested in functional context   Perception     Praxis      Cognition Arousal/Alertness: Awake/alert Behavior During Therapy: Flat affect Overall Cognitive Status: History of cognitive impairments - at baseline  Exercises      Shoulder Instructions       General Comments VSS on RA    Pertinent Vitals/ Pain       Pain Assessment Pain Assessment: No/denies pain  Home Living Family/patient expects to be discharged to:: Private  residence Living Arrangements: Spouse/significant other                                      Prior Functioning/Environment              Frequency  Min 2X/week        Progress Toward Goals  OT Goals(current goals can now be found in the care plan section)  Progress towards OT goals: Progressing toward goals  Acute Rehab OT Goals Patient Stated Goal: unable OT Goal Formulation: With patient Time For Goal Achievement: 10/24/22 Potential to Achieve Goals: Fair ADL Goals Pt Will Perform Upper Body Dressing: with supervision;sitting Pt Will Perform Lower Body Dressing: with mod assist;sit to/from stand Pt Will Transfer to Toilet: with min assist;stand pivot transfer;bedside commode  Plan Discharge plan remains appropriate;Frequency remains appropriate    Co-evaluation                 AM-PAC OT "6 Clicks" Daily Activity     Outcome Measure   Help from another person eating meals?: A Lot Help from another person taking care of personal grooming?: A Lot Help from another person toileting, which includes using toliet, bedpan, or urinal?: A Lot Help from another person bathing (including washing, rinsing, drying)?: A Lot Help from another person to put on and taking off regular upper body clothing?: A Lot Help from another person to put on and taking off regular lower body clothing?: A Lot 6 Click Score: 12    End of Session Equipment Utilized During Treatment: Gait belt  OT Visit Diagnosis: Unsteadiness on feet (R26.81);Muscle weakness (generalized) (M62.81);Other symptoms and signs involving cognitive function   Activity Tolerance Patient tolerated treatment well   Patient Left with call bell/phone within reach;in chair;with chair alarm set;with family/visitor present   Nurse Communication Mobility status        Time: 7829-5621 OT Time Calculation (min): 43 min  Charges: OT General Charges $OT Visit: 1 Visit OT Treatments $Self  Care/Home Management : 8-22 mins $Therapeutic Activity: 23-37 mins  Tyler Deis, OTR/L [facility] Acute Rehabilitation Office: [phone]   Myrla Halsted 10/14/2022, 2:14 PM

## 2022-10-14 NOTE — Progress Notes (Addendum)
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  Workup revealed right-sided pneumonia, UTI.  Urine culture grew Klebsiella pneumoniae. CXR  also showed right-sided pneumonia, currently hemodynamically stable. PT/OT recommending skilled nursing facility on discharge.  TOC following for disposition.  Assessment & Plan:  Principal Problem:   UTI (urinary tract infection) Active Problems:   AMS (altered mental status)   Urinary tract infection   AKI (acute kidney injury) (HCC)   Altered mental status   Altered mental status/history of dementia: Patient follows with neurology for dementia as an outpatient.  Lives with her husband.  At baseline, confused to time and location.  Altered mental status was thought to be secondary to UTI, AKI on presentation .CT head did not show any acute intracranial abnormalities.  MRI was attempted but she could not tolerate despite premedication.  No focal neurological deficits.  Continue delirium precautions.Mentation at baseline now  AKI: Resolved  Community-acquired pneumonia: Chest x-ray showed right middle and right lower lobe infiltrate.  She was on cefepime. She is currently on room air.  Respiratory status stable.No cough or SOB.  Likely this is aspiration pneumonia.  Speech therapy consulted and recommended dysphagia 1 diet.  Continue unysyn until she is discharged  UTI: Urine culture shows Klebsiella.  Continue current antibiotics.  Afebrile this morning.  Deconditioning/debility: Patient seen by PT/OT and recommended SNF on discharge.AT baseline, patient is ambulatory without any problem.  Lives at home with husband.  Climbs stairs      DVT prophylaxis:enoxaparin (LOVENOX) injection 40 mg Start: 10/09/22 0800 SCDs Start:  10/08/22 2355     Code Status: Full Code  Family Communication: Discussed with daughter on phone on 6/27  Patient status:Inpatient  Patient is from :Home  Anticipated discharge to:SNF  Estimated DC date:1-2 days   Consultants: None  Procedures:None  Antimicrobials:  Anti-infectives (From admission, onward)    Start     Dose/Rate Route Frequency Ordered Stop   10/14/22 0900  Ampicillin-Sulbactam (UNASYN) 3 g in sodium chloride 0.9 % 100 mL IVPB        3 g 200 mL/hr over 30 Minutes Intravenous Every 6 hours 10/14/22 0745     10/12/22 1200  amoxicillin-clavulanate (AUGMENTIN) 875-125 MG per tablet 1 tablet  Status:  Discontinued        1 tablet Oral Every 12 hours 10/12/22 1107 10/14/22 0735   10/11/22 1930  ceFEPIme (MAXIPIME) 2 g in sodium chloride 0.9 % 100 mL IVPB  Status:  Discontinued        2 g 200 mL/hr over 30 Minutes Intravenous Every 12 hours 10/11/22 1838 10/12/22 1107   10/11/22 1800  ceFAZolin (ANCEF) IVPB 1 g/50 mL premix  Status:  Discontinued        1 g 100 mL/hr over 30 Minutes Intravenous Every 8 hours 10/11/22 0800 10/11/22 1834   10/10/22 2100  cefTRIAXone (ROCEPHIN) 1 g in sodium chloride 0.9 % 100 mL IVPB  Status:  Discontinued        1 g 200 mL/hr over 30 Minutes Intravenous Every 24 hours 10/10/22 1537 10/11/22 0800   10/09/22 2200  cefTRIAXone (ROCEPHIN) 2 g in sodium chloride 0.9 % 100 mL IVPB        2 g 200 mL/hr over 30 Minutes Intravenous Every 24 hours 10/08/22 2316 10/10/22 0755  10/09/22 0000  cefTRIAXone (ROCEPHIN) 2 g in sodium chloride 0.9 % 100 mL IVPB  Status:  Discontinued        2 g 200 mL/hr over 30 Minutes Intravenous Every 24 hours 10/08/22 2229 10/08/22 2316   10/08/22 2145  cefTRIAXone (ROCEPHIN) 2 g in sodium chloride 0.9 % 100 mL IVPB        2 g 200 mL/hr over 30 Minutes Intravenous  Once 10/08/22 2138 10/08/22 2231       Subjective: Patient seen and examined the bedside today.  Remains on room air.  Not in any kind of  respiratory distress.  Not coughing.  Lying in bed.  Remains confused which is her baseline.  Objective: Vitals:   10/13/22 1508 10/13/22 2047 10/13/22 2127 10/14/22 0532  BP: 137/62 (!) 134/56 (!) 133/50 (!) 136/48  Pulse: 64 78 78 64  Resp: 17 18 18 18   Temp: 98.2 F (36.8 C) (!) 100.8 F (38.2 C) (!) 100.5 F (38.1 C) 98.7 F (37.1 C)  TempSrc:  Oral    SpO2: 98% 99% 98% 99%  Weight:      Height:        Intake/Output Summary (Last 24 hours) at 10/14/2022 1048 Last data filed at 10/14/2022 1017 Gross per 24 hour  Intake 3 ml  Output 250 ml  Net -247 ml   Filed Weights   10/08/22 2134 10/12/22 0100  Weight: 62.6 kg 61.8 kg    Examination:  General exam: Overall comfortable, not in distress, pleasantly confused HEENT: PERRL Respiratory system:  no wheezes or crackles ,mildly diminished air sounds on bases Cardiovascular system: S1 & S2 heard, RRR.  Gastrointestinal system: Abdomen is nondistended, soft and nontender. Central nervous system: Alert and awake but not oriented Extremities: No edema, no clubbing ,no cyanosis Skin: No rashes, no ulcers,no icterus      Data Reviewed: I have personally reviewed following labs and imaging studies  CBC: Recent Labs  Lab 10/08/22 1705 10/09/22 0639 10/10/22 0548 10/11/22 0558 10/13/22 0440 10/14/22 0715  WBC 9.0 9.8 10.3 11.0* 8.5 8.2  NEUTROABS 7.4  --   --  8.9*  --   --   HGB 14.0 12.1 11.8* 10.8* 10.2* 10.4*  HCT 42.3 37.8 34.9* 33.7* 31.4* 32.1*  MCV 87.0 88.1 86.4 88.9 86.3 86.8  PLT 165 142* 164 205 265 301   Basic Metabolic Panel: Recent Labs  Lab 10/10/22 0548 10/11/22 0558 10/12/22 0756 10/13/22 0440 10/14/22 0715  NA 144 143 142 141 136  K 3.2* 3.6 3.7 3.6 3.3*  CL 111 112* 113* 107 105  CO2 23 21* 20* 22 22  GLUCOSE 121* 113* 101* 95 99  BUN 14 10 9 10 8   CREATININE 0.85 0.72 0.58 0.67 0.68  CALCIUM 8.5* 8.1* 8.1* 8.3* 8.1*  MG  --  1.9  --   --   --   PHOS  --  2.0* 2.5  --   --       Recent Results (from the past 240 hour(s))  Urine Culture     Status: Abnormal   Collection Time: 10/08/22  9:38 PM   Specimen: Urine, Clean Catch  Result Value Ref Range Status   Specimen Description URINE, CLEAN CATCH  Final   Special Requests   Final    NONE Performed at [facility], [address] [place], [place] [address]    Culture 50,000 COLONIES/mL KLEBSIELLA PNEUMONIAE (A)  Final   Report Status 10/10/2022 FINAL  Final   Organism ID, Bacteria KLEBSIELLA PNEUMONIAE (A)  Final      Susceptibility   Klebsiella pneumoniae - MIC*    AMPICILLIN RESISTANT Resistant     CEFAZOLIN <=4 SENSITIVE Sensitive     CEFEPIME <=0.12 SENSITIVE Sensitive     CEFTRIAXONE <=0.25 SENSITIVE Sensitive     CIPROFLOXACIN <=0.25 SENSITIVE Sensitive     GENTAMICIN <=1 SENSITIVE Sensitive     IMIPENEM <=0.25 SENSITIVE Sensitive     NITROFURANTOIN 128 RESISTANT Resistant     TRIMETH/SULFA <=20 SENSITIVE Sensitive     AMPICILLIN/SULBACTAM 4 SENSITIVE Sensitive     PIP/TAZO <=4 SENSITIVE Sensitive     * 50,000 COLONIES/mL KLEBSIELLA PNEUMONIAE  Culture, blood (Routine X 2) w Reflex to ID Panel     Status: None (Preliminary result)   Collection Time: 10/11/22  3:46 PM   Specimen: BLOOD LEFT HAND  Result Value Ref Range Status   Specimen Description BLOOD LEFT HAND  Final   Special Requests   Final    BOTTLES DRAWN AEROBIC AND ANAEROBIC Blood Culture adequate volume   Culture   Final    NO GROWTH 3 DAYS Performed at [facility], [address], [place], [place] [address]    Report Status PENDING  Incomplete  Culture, blood (Routine X 2) w Reflex to ID Panel     Status: None (Preliminary result)   Collection Time: 10/11/22  3:56 PM   Specimen: BLOOD  Result Value Ref Range Status   Specimen Description BLOOD LEFT ANTECUBITAL  Final   Special Requests   Final    BOTTLES DRAWN AEROBIC AND ANAEROBIC Blood Culture adequate volume   Culture   Final    NO GROWTH 3  DAYS Performed at [facility], [address], [place], [place] [address]    Report Status PENDING  Incomplete     Radiology Studies: DG CHEST PORT 1 VIEW  Result Date: 10/13/2022 CLINICAL DATA:  Pneumonia.  Follow-up. EXAM: PORTABLE CHEST 1 VIEW COMPARISON:  10/11/2022 FINDINGS: Heart size remains normal. Aortic atherosclerotic calcification is seen. The left lung is clear. No left effusion. Persistent and possibly worsened right lower lung pneumonia and pleural fluid. Right lung apex is clear. IMPRESSION: Persistent and possibly worsened right lower lung pneumonia and pleural fluid. Electronically Signed   By: Paulina Fusi M.D.   On: 10/13/2022 15:54    Scheduled Meds:  enoxaparin (LOVENOX) injection  40 mg Subcutaneous Q24H   insulin aspart  0-15 Units Subcutaneous TID WC   insulin aspart  0-5 Units Subcutaneous QHS   polyethylene glycol  17 g Oral BID   sodium chloride flush  3 mL Intravenous Q12H   Continuous Infusions:  ampicillin-sulbactam (UNASYN) IV 3 g (10/14/22 1016)      LOS: 5 days   Burnadette Pop, MD [facility] P6/27/2024, 10:48 AM

## 2022-10-14 NOTE — Progress Notes (Signed)
Initial Nutrition Assessment  DOCUMENTATION CODES:   Not applicable  INTERVENTION:  Feeding assistance with all meals per SLP recommendation Ensure Enlive po BID, each supplement provides 350 kcal and 20 grams of protein. Magic cup TID with meals, each supplement provides 290 kcal and 9 grams of protein  NUTRITION DIAGNOSIS:   Increased nutrient needs related to acute illness as evidenced by estimated needs.  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 Admitted with  c/o abdominal pain, fever d/t R sided PNA and UTI.  6/25- s/p BSE- diet changed to dysphagia 1, thin d/t cognitive based oral dysphagia 6/27 - SLP advanced diet to regular, thin liquids; full assist with feeding  Pt remains inpatient for treatment of likely aspiration PNA. Noted CSW/family working on SNF placement.   Flowsheet documentation reflects pt to be disoriented x4.  Assessed pt at bedside. No family present. RN present and reports pt only consumed cranberry juice this morning. 0% breakfast consumed. Requires feeding assistance but pt declined eating.    Reviewed weight history. Although limited, it does not appear pt has had recent significant weight loss. Will continue to monitor trends throughout admission.   Medications: SSI 0-15 units TID, SSI 0-5 units at bedtime, miralax, IV abx  Labs: potassium 3.3, CBG's 78-112 x24 hours  NUTRITION - FOCUSED PHYSICAL EXAM:  Flowsheet Row Most Recent Value  Orbital Region No depletion  Upper Arm Region No depletion  Thoracic and Lumbar Region No depletion  Buccal Region No depletion  Temple Region Moderate depletion  Clavicle Bone Region Mild depletion  Clavicle and Acromion Bone Region Moderate depletion  Scapular Bone Region Mild  depletion  Dorsal Hand Mild depletion  Patellar Region No depletion  Anterior Thigh Region No depletion  Posterior Calf Region No depletion  Edema (RD Assessment) None  Hair Reviewed  Eyes Reviewed  Mouth Reviewed  Skin Reviewed  Nails Reviewed       Diet Order:   Diet Order             Diet regular Room service appropriate? No; Fluid consistency: Thin  Diet effective now                   EDUCATION NEEDS:   No education needs have been identified at this time  Skin:  Skin Assessment: Reviewed RN Assessment  Last BM:  6/27 per RN report  Height:   Ht Readings from Last 1 Encounters:  10/08/22 5\' 5"  (1.651 m)    Weight:   Wt Readings from Last 1 Encounters:  10/12/22 61.8 kg   BMI:  Body mass index is 22.67 kg/m.  Estimated Nutritional Needs:   Kcal:  1600-1800  Protein:  80-95g  Fluid:  >/=1.6L  Drusilla Kanner, RDN, LDN Clinical Nutrition

## 2022-10-14 NOTE — Progress Notes (Signed)
Occupational Therapy Contact note  Patient Details Name: Amanda [name] MRN: [id number] DOB: [birth year] Today's Date: 10/14/2022   History of present illness The pt is a 77 yo female presenting 6/21 with AMS and abdominal pain. Pt found to have AKI and UTI. PMH includes: moderate-severe dementia, and dyslipidemia.   OT comments  Pt husband locating OT in hall asking for transfer back to bed. Returning for transfer back to bed with mod A of 2. Max A to return to supine. Good tolerance. No charge as OT in room <8 mins.    Recommendations for follow up therapy are one component of a multi-disciplinary discharge planning process, led by the attending physician.  Recommendations may be updated based on patient status, additional functional criteria and insurance authorization.    Assistance Recommended at Discharge Frequent or constant Supervision/Assistance  Patient can return home with the following  Two people to help with walking and/or transfers;A lot of help with bathing/dressing/bathroom;Assistance with cooking/housework;Direct supervision/assist for medications management;Direct supervision/assist for financial management;Assist for transportation;Help with stairs or ramp for entrance;Assistance with feeding   Equipment Recommendations  Other (comment) (defer)    Recommendations for Other Services      Precautions / Restrictions Precautions Precautions: Fall Restrictions Weight Bearing Restrictions: No       Mobility Bed Mobility Overal bed mobility: Needs Assistance Bed Mobility: Sit to Supine     Supine to sit: Mod assist, HOB elevated Sit to supine: Max assist        Transfers Overall transfer level: Needs assistance Equipment used: 2 person hand held assist Transfers: Sit to/from Stand, Bed to chair/wheelchair/BSC Sit to Stand: +2 physical assistance, Mod assist     Step pivot transfers: Mod assist, +2 physical assistance, +2 safety/equipment      General transfer comment: Assist for BLE blocking, rise, and steady     Balance Overall balance assessment: Needs assistance Sitting-balance support: No upper extremity supported, Feet supported Sitting balance-Leahy Scale: Fair Sitting balance - Comments: min guard A EOB   Standing balance support: During functional activity, Bilateral upper extremity supported Standing balance-Leahy Scale: Zero Standing balance comment: mod+2 to maintain upright                           ADL either performed or assessed with clinical judgement   ADL Overall ADL's : Needs assistance/impaired                         Toilet Transfer: Maximal assistance;+2 for physical assistance;+2 for safety/equipment;Stand-pivot                  Extremity/Trunk Assessment Upper Extremity Assessment Upper Extremity Assessment: Generalized weakness   Lower Extremity Assessment Lower Extremity Assessment: Generalized weakness        Vision   Vision Assessment?: Vision impaired- to be further tested in functional context   Perception     Praxis      Cognition Arousal/Alertness: Awake/alert Behavior During Therapy: WFL for tasks assessed/performed Overall Cognitive Status: History of cognitive impairments - at baseline                                 General Comments: Following commands intermittently        Exercises      Shoulder Instructions       General Comments VSS    Pertinent Vitals/  Pain       Pain Assessment Pain Assessment: No/denies pain  Home Living Family/patient expects to be discharged to:: Private residence Living Arrangements: Spouse/significant other                                      Prior Functioning/Environment              Frequency  Min 2X/week        Progress Toward Goals  OT Goals(current goals can now be found in the care plan section)  Progress towards OT goals: Progressing toward  goals  Acute Rehab OT Goals Patient Stated Goal: unable OT Goal Formulation: With patient Time For Goal Achievement: 10/24/22 Potential to Achieve Goals: Fair ADL Goals Pt Will Perform Upper Body Dressing: with supervision;sitting Pt Will Perform Lower Body Dressing: with mod assist;sit to/from stand Pt Will Transfer to Toilet: with min assist;stand pivot transfer;bedside commode  Plan Discharge plan remains appropriate;Frequency remains appropriate    Co-evaluation                 AM-PAC OT "6 Clicks" Daily Activity     Outcome Measure   Help from another person eating meals?: A Lot Help from another person taking care of personal grooming?: A Lot Help from another person toileting, which includes using toliet, bedpan, or urinal?: A Lot Help from another person bathing (including washing, rinsing, drying)?: A Lot Help from another person to put on and taking off regular upper body clothing?: A Lot Help from another person to put on and taking off regular lower body clothing?: A Lot 6 Click Score: 12    End of Session Equipment Utilized During Treatment: Gait belt  OT Visit Diagnosis: Unsteadiness on feet (R26.81);Muscle weakness (generalized) (M62.81);Other symptoms and signs involving cognitive function   Activity Tolerance Patient tolerated treatment well   Patient Left with chair alarm set;with family/visitor present;in bed;with bed alarm set   Nurse Communication Mobility status        Time: 4098-1191 OT Time Calculation (min): 7 min  Charges: OT General Charges   Tyler Deis, OTR/[facility] Acute Rehabilitation Office: [phone]   Myrla Halsted 10/14/2022, 2:20 PM

## 2022-10-14 NOTE — Progress Notes (Signed)
Speech Language Pathology Treatment: Dysphagia  Patient Details Name: Amanda [name] MRN: [id number] DOB: 1946-01-14 Today's Date: 10/14/2022 Time: 4098-1191 SLP Time Calculation (min) (ACUTE ONLY): 26 min  Assessment / Plan / Recommendation Clinical Impression  Spent lot of time with pt and husband this morning and updated spouse on swallow status. Pt's husband reports she appears to be at her baseline and was eating regular foods several days ago. SLP updated on swallow eval and downgrade to puree. She does seem to have improved somewhat from initial eval; more aware of surroundings and able to interact with therapist and husband. Husband fed her peaches with mild-moderate prolonged mastication that husband confirms is baseline status and able to clear oral cavity. Spouse is typically present with all meals and orders food that she likes and feels she can masticate efficiently. Husband states she typically doesn't pocket food it she likes it but would expect this intermittently given her dementia. SLP will upgrade diet texture to regular, continue thin, pills crushed and full assist with feeding. ST will plan to check on her once more next week.   HPI HPI: The pt is a 77 yo female presenting 6/21 with AMS and abdominal pain. Pt found to have AKI and UTI. PMH includes: moderate-severe dementia, and dyslipidemia. CXR  Chest x-ray done today revealed right middle and lower lobe infiltrates.  Per MD note pt has difficulty taking liquids at home.      SLP Plan  Continue with current plan of care      Recommendations for follow up therapy are one component of a multi-disciplinary discharge planning process, led by the attending physician.  Recommendations may be updated based on patient status, additional functional criteria and insurance authorization.    Recommendations  Diet recommendations: Regular;Thin liquid Liquids provided via: Cup;Straw Medication Administration: Crushed with  puree Supervision: Staff to assist with self feeding;Full supervision/cueing for compensatory strategies Compensations: Lingual sweep for clearance of pocketing Postural Changes and/or Swallow Maneuvers: Seated upright 90 degrees                  Oral care BID   Frequent or constant Supervision/Assistance Dysphagia, unspecified (R13.10)     Continue with current plan of care     Royce Macadamia  10/14/2022, 12:20 PM

## 2022-10-14 NOTE — Progress Notes (Addendum)
Pharmacy Antibiotic Note  Amanda [name] is a 77 y.o. female admitted on 10/08/2022 with AMS.  Noted pt has been on mult abx for PNA + Klebsiella UTI since 6/21. Pharmacy now consulted for Unasyn dosing for aspiration pneumonia.  Tm 100.5 yesterday evening, WBC improved and WNL. Noted to have worsened RLL PNA/fluid on CXR 6/26.  Plan: Unasyn 3g IV q6 hours Will f/u renal function, micro data, and pt's clinical condition  Height: 5\' 5"  (165.1 cm) Weight: 61.8 kg (136 lb 3.9 oz) IBW/kg (Calculated) : 57  Temp (24hrs), Avg:99.6 F (37.6 C), Min:98.2 F (36.8 C), Max:100.8 F (38.2 C)  Recent Labs  Lab 10/08/22 1705 10/09/22 0639 10/10/22 0548 10/11/22 0558 10/12/22 0756 10/13/22 0440  WBC 9.0 9.8 10.3 11.0*  --  8.5  CREATININE 1.05* 0.86 0.85 0.72 0.58 0.67     Estimated Creatinine Clearance: 53 mL/min (by C-G formula based on SCr of 0.67 mg/dL).    No Known Allergies  Antimicrobials this admission: 6/21 Rocephin >> 6/23 6/24 Ancef >> 6/24 6/24 Cefepime >> 6/25 6/25 Augmentin >> 6/26 6/27 Unasyn >>   Microbiology results: 6/24 BCx: ngtd 6/21 UCx: kleb pneumo (R amp/nitrofurantoin)  Thank you for allowing pharmacy to be a part of this patient's care.  Rexford Maus, PharmD, BCPS 10/14/2022 7:41 AM

## 2022-10-15 DIAGNOSIS — N3000 Acute cystitis without hematuria: Secondary | ICD-10-CM | POA: Diagnosis not present

## 2022-10-15 LAB — CREATININE, SERUM
Creatinine, Ser: 0.71 mg/dL (ref 0.44–1.00)
GFR, Estimated: >60 mL/min (ref >60)

## 2022-10-15 LAB — CULTURE, BLOOD (ROUTINE X 2): Culture: NO GROWTH

## 2022-10-15 LAB — GLUCOSE, CAPILLARY
Glucose-Capillary: 99 mg/dL (ref 70–99)
Glucose-Capillary: 88 mg/dL (ref 70–99)
Glucose-Capillary: 85 mg/dL (ref 70–99)
Glucose-Capillary: 89 mg/dL (ref 70–99)
Glucose-Capillary: 77 mg/dL (ref 70–99)

## 2022-10-15 NOTE — Progress Notes (Signed)
Physical Therapy Treatment Patient Details Name: ARLENYS KESLER MRN: [id number] DOB: 1945/10/16 Today's Date: 10/15/2022   History of Present Illness The pt is a 77 yo female presenting 6/21 with AMS and abdominal pain. Pt found to have AKI and UTI. PMH includes: moderate-severe dementia, and dyslipidemia.    PT Comments    Pt continues to progress towards acute PT goals. Pt able to come to sitting EOB with mod A to initiate transfer with pt then able to complete once understanding task at hand. Pt with fair sitting balance, able to maintain with min guard >15 mins. Pt resistant to attempts at standing needing max A +2 to rise. Pt with bowel incontinence upon standing thus transfer to chair deferred this session. Current plan remains appropriate to address deficits and maximize functional independence and decrease caregiver burden. Pt continues to benefit from skilled PT services to progress toward functional mobility goals.     Recommendations for follow up therapy are one component of a multi-disciplinary discharge planning process, led by the attending physician.  Recommendations may be updated based on patient status, additional functional criteria and insurance authorization.  Follow Up Recommendations  Can patient physically be transported by private vehicle: No    Assistance Recommended at Discharge Frequent or constant Supervision/Assistance  Patient can return home with the following Two people to help with bathing/dressing/bathroom;Assistance with feeding;Assist for transportation;Help with stairs or ramp for entrance;Two people to help with walking and/or transfers   Equipment Recommendations  None recommended by PT    Recommendations for Other Services       Precautions / Restrictions Precautions Precautions: Fall Restrictions Weight Bearing Restrictions: No     Mobility  Bed Mobility Overal bed mobility: Needs Assistance Bed Mobility: Sit to Supine, Supine to  Sit, Rolling Rolling: Mod assist   Supine to sit: Mod assist, HOB elevated Sit to supine: Max assist   General bed mobility comments: assist to progress LEs to/from EOB, once pt understands task pt engages and elevates/lowers trunk. boost assist with return to supine    Transfers Overall transfer level: Needs assistance Equipment used: 2 person hand held assist Transfers: Sit to/from Stand Sit to Stand: +2 physical assistance, Mod assist           General transfer comment: Assist for BLE blocking, rise, and steady, pt resisiting initially on rise, pt with bpwel incontinence, trasnfer to chair deferred    Ambulation/Gait               General Gait Details: pt unable to maintain standing, did not attempt   Stairs             Wheelchair Mobility    Modified Rankin (Stroke Patients Only)       Balance Overall balance assessment: Needs assistance Sitting-balance support: No upper extremity supported, Feet supported Sitting balance-Leahy Scale: Fair Sitting balance - Comments: min guard A EOB   Standing balance support: During functional activity, Bilateral upper extremity supported Standing balance-Leahy Scale: Zero Standing balance comment: mod+2 to maintain upright                            Cognition Arousal/Alertness: Awake/alert Behavior During Therapy: WFL for tasks assessed/performed Overall Cognitive Status: History of cognitive impairments - at baseline                                 General  Comments: Following commands intermittently        Exercises      General Comments General comments (skin integrity, edema, etc.): VSS on RA      Pertinent Vitals/Pain Pain Assessment Pain Assessment: Faces Faces Pain Scale: Hurts a little bit Pain Location: grimacing with movement, pt did not state or indicate any specific pain Pain Descriptors / Indicators: Grimacing Pain Intervention(s): Monitored during session,  Limited activity within patient's tolerance    Home Living                          Prior Function            PT Goals (current goals can now be found in the care plan section) Acute Rehab PT Goals Patient Stated Goal: none stated PT Goal Formulation: Patient unable to participate in goal setting Time For Goal Achievement: 10/23/22 Progress towards PT goals: Progressing toward goals    Frequency    Min 3X/week      PT Plan Current plan remains appropriate    Co-evaluation              AM-PAC PT "6 Clicks" Mobility   Outcome Measure  Help needed turning from your back to your side while in a flat bed without using bedrails?: A Lot Help needed moving from lying on your back to sitting on the side of a flat bed without using bedrails?: A Lot Help needed moving to and from a bed to a chair (including a wheelchair)?: Total Help needed standing up from a chair using your arms (e.g., wheelchair or bedside chair)?: Total Help needed to walk in hospital room?: Total Help needed climbing 3-5 steps with a railing? : Total 6 Click Score: 8    End of Session Equipment Utilized During Treatment: Gait belt Activity Tolerance: Patient tolerated treatment well Patient left: in bed;with call bell/phone within reach;with bed alarm set;with family/visitor present Nurse Communication: Mobility status PT Visit Diagnosis: Other abnormalities of gait and mobility (R26.89);Muscle weakness (generalized) (M62.81)     Time: 1610-9604 PT Time Calculation (min) (ACUTE ONLY): 23 min  Charges:  $Therapeutic Activity: 23-37 mins                     [name] Frickey R. PTA Acute Rehabilitation Services Office: [phone]   Catalina Antigua 10/15/2022, 4:08 PM

## 2022-10-15 NOTE — Progress Notes (Signed)
Patient refused scheduled morning lab draws this morning.

## 2022-10-15 NOTE — Progress Notes (Signed)
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  Workup revealed right-sided pneumonia, UTI.  Urine culture grew Klebsiella pneumoniae. CXR  also showed right-sided pneumonia, currently hemodynamically stable. PT/OT recommending skilled nursing facility on discharge.  TOC following for disposition.  Medically stable for discharge whenever possible.  Will change antibiotics to oral on discharge  Assessment & Plan:  Principal Problem:   UTI (urinary tract infection) Active Problems:   AMS (altered mental status)   Urinary tract infection   AKI (acute kidney injury) (HCC)   Altered mental status   Altered mental status/history of dementia: Patient follows with neurology for dementia as an outpatient.  Lives with her husband.  At baseline, confused to time and location.  Altered mental status was thought to be secondary to UTI, AKI on presentation .CT head did not show any acute intracranial abnormalities.  MRI was attempted but she could not tolerate despite premedication.  No focal neurological deficits.  Continue delirium precautions.Mentation at baseline now  AKI: Resolved  Community-acquired pneumonia: Chest x-ray showed right middle and right lower lobe infiltrate.  She was on cefepime. She is currently on room air.  Respiratory status stable.No cough or SOB.  Likely this is aspiration pneumonia.  Speech therapy consulted and recommended dysphagia 1 diet.  Continue unysyn till tomorrow  UTI: Urine culture shows Klebsiella.  Continue current antibiotics.  Afebrile this morning.  Deconditioning/debility: Patient seen by PT/OT and recommended SNF on discharge.AT baseline, patient is ambulatory without any problem.  Lives at home with husband.  Climbs stairs Nutrition  Problem: Increased nutrient needs Etiology: acute illness    DVT prophylaxis:enoxaparin (LOVENOX) injection 40 mg Start: 10/09/22 0800 SCDs Start: 10/08/22 2355     Code Status: Full Code  Family Communication: Discussed with daughter on phone on 6/27  Patient status:Inpatient  Patient is from :Home  Anticipated discharge to:SNF  Estimated DC date:whenever possible   Consultants: None  Procedures:None  Antimicrobials:  Anti-infectives (From admission, onward)    Start     Dose/Rate Route Frequency Ordered Stop   10/14/22 0900  Ampicillin-Sulbactam (UNASYN) 3 g in sodium chloride 0.9 % 100 mL IVPB        3 g 200 mL/hr over 30 Minutes Intravenous Every 6 hours 10/14/22 0745     10/12/22 1200  amoxicillin-clavulanate (AUGMENTIN) 875-125 MG per tablet 1 tablet  Status:  Discontinued        1 tablet Oral Every 12 hours 10/12/22 1107 10/14/22 0735   10/11/22 1930  ceFEPIme (MAXIPIME) 2 g in sodium chloride 0.9 % 100 mL IVPB  Status:  Discontinued        2 g 200 mL/hr over 30 Minutes Intravenous Every 12 hours 10/11/22 1838 10/12/22 1107   10/11/22 1800  ceFAZolin (ANCEF) IVPB 1 g/50 mL premix  Status:  Discontinued        1 g 100 mL/hr over 30 Minutes Intravenous Every 8 hours 10/11/22 0800 10/11/22 1834   10/10/22 2100  cefTRIAXone (ROCEPHIN) 1 g in sodium chloride 0.9 % 100 mL IVPB  Status:  Discontinued        1 g 200 mL/hr over 30 Minutes Intravenous Every 24 hours 10/10/22 1537 10/11/22 0800   10/09/22 2200  cefTRIAXone (ROCEPHIN) 2 g in sodium chloride 0.9 % 100 mL IVPB  2 g 200 mL/hr over 30 Minutes Intravenous Every 24 hours 10/08/22 2316 10/10/22 0755   10/09/22 0000  cefTRIAXone (ROCEPHIN) 2 g in sodium chloride 0.9 % 100 mL IVPB  Status:  Discontinued        2 g 200 mL/hr over 30 Minutes Intravenous Every 24 hours 10/08/22 2229 10/08/22 2316   10/08/22 2145  cefTRIAXone (ROCEPHIN) 2 g in sodium chloride 0.9 % 100 mL IVPB        2 g 200 mL/hr over 30  Minutes Intravenous  Once 10/08/22 2138 10/08/22 2231       Subjective: Patient seen and examined at bedside today.  Hemodynamically stable comfortable.  Lying in bed.  More alert today.  Obeys commands but confused.  Not in any Distress.  On room air.  Not coughing, speaking in full sentences  Objective: Vitals:   10/14/22 1621 10/14/22 1921 10/15/22 0408 10/15/22 0715  BP: 125/65 132/60 130/61 112/66  Pulse: 70 73 69 63  Resp: 18 18 18 18   Temp: 98.9 F (37.2 C) 98.7 F (37.1 C) 99 F (37.2 C) 99 F (37.2 C)  TempSrc: Oral Oral Oral   SpO2: 92% 100% 96% 98%  Weight:      Height:        Intake/Output Summary (Last 24 hours) at 10/15/2022 1053 Last data filed at 10/15/2022 0439 Gross per 24 hour  Intake 381.09 ml  Output 400 ml  Net -18.91 ml   Filed Weights   10/08/22 2134 10/12/22 0100  Weight: 62.6 kg 61.8 kg    Examination:   General exam: Overall comfortable, not in distress, confused HEENT: PERRL Respiratory system:  no wheezes or crackles, mild diminished sounds on the right side Cardiovascular system: S1 & S2 heard, RRR.  Gastrointestinal system: Abdomen is nondistended, soft and nontender. Central nervous system: Alert and awake but confused Extremities: No edema, no clubbing ,no cyanosis Skin: No rashes, no ulcers,no icterus     Data Reviewed: I have personally reviewed following labs and imaging studies  CBC: Recent Labs  Lab 10/08/22 1705 10/09/22 0639 10/10/22 0548 10/11/22 0558 10/13/22 0440 10/14/22 0715  WBC 9.0 9.8 10.3 11.0* 8.5 8.2  NEUTROABS 7.4  --   --  8.9*  --   --   HGB 14.0 12.1 11.8* 10.8* 10.2* 10.4*  HCT 42.3 37.8 34.9* 33.7* 31.4* 32.1*  MCV 87.0 88.1 86.4 88.9 86.3 86.8  PLT 165 142* 164 205 265 301   Basic Metabolic Panel: Recent Labs  Lab 10/10/22 0548 10/11/22 0558 10/12/22 0756 10/13/22 0440 10/14/22 0715  NA 144 143 142 141 136  K 3.2* 3.6 3.7 3.6 3.3*  CL 111 112* 113* 107 105  CO2 23 21* 20* 22 22   GLUCOSE 121* 113* 101* 95 99  BUN 14 10 9 10 8   CREATININE 0.85 0.72 0.58 0.67 0.68  CALCIUM 8.5* 8.1* 8.1* 8.3* 8.1*  MG  --  1.9  --   --   --   PHOS  --  2.0* 2.5  --   --      Recent Results (from the past 240 hour(s))  Urine Culture     Status: Abnormal   Collection Time: 10/08/22  9:38 PM   Specimen: Urine, Clean Catch  Result Value Ref Range Status   Specimen Description URINE, CLEAN CATCH  Final   Special Requests   Final    NONE Performed at [facility], [address], [place], [place] [address]  Culture 50,000 COLONIES/mL KLEBSIELLA PNEUMONIAE (A)  Final   Report Status 10/10/2022 FINAL  Final   Organism ID, Bacteria KLEBSIELLA PNEUMONIAE (A)  Final      Susceptibility   Klebsiella pneumoniae - MIC*    AMPICILLIN RESISTANT Resistant     CEFAZOLIN <=4 SENSITIVE Sensitive     CEFEPIME <=0.12 SENSITIVE Sensitive     CEFTRIAXONE <=0.25 SENSITIVE Sensitive     CIPROFLOXACIN <=0.25 SENSITIVE Sensitive     GENTAMICIN <=1 SENSITIVE Sensitive     IMIPENEM <=0.25 SENSITIVE Sensitive     NITROFURANTOIN 128 RESISTANT Resistant     TRIMETH/SULFA <=20 SENSITIVE Sensitive     AMPICILLIN/SULBACTAM 4 SENSITIVE Sensitive     PIP/TAZO <=4 SENSITIVE Sensitive     * 50,000 COLONIES/mL KLEBSIELLA PNEUMONIAE  Culture, blood (Routine X 2) w Reflex to ID Panel     Status: None (Preliminary result)   Collection Time: 10/11/22  3:46 PM   Specimen: BLOOD LEFT HAND  Result Value Ref Range Status   Specimen Description BLOOD LEFT HAND  Final   Special Requests   Final    BOTTLES DRAWN AEROBIC AND ANAEROBIC Blood Culture adequate volume   Culture   Final    NO GROWTH 4 DAYS Performed at [facility], [address], [place], [place] [address]    Report Status PENDING  Incomplete  Culture, blood (Routine X 2) w Reflex to ID Panel     Status: None (Preliminary result)   Collection Time: 10/11/22  3:56 PM   Specimen: BLOOD  Result Value Ref Range Status   Specimen  Description BLOOD LEFT ANTECUBITAL  Final   Special Requests   Final    BOTTLES DRAWN AEROBIC AND ANAEROBIC Blood Culture adequate volume   Culture   Final    NO GROWTH 4 DAYS Performed at [facility], [address], [place], [place] [address]    Report Status PENDING  Incomplete     Radiology Studies: DG CHEST PORT 1 VIEW  Result Date: 10/13/2022 CLINICAL DATA:  Pneumonia.  Follow-up. EXAM: PORTABLE CHEST 1 VIEW COMPARISON:  10/11/2022 FINDINGS: Heart size remains normal. Aortic atherosclerotic calcification is seen. The left lung is clear. No left effusion. Persistent and possibly worsened right lower lung pneumonia and pleural fluid. Right lung apex is clear. IMPRESSION: Persistent and possibly worsened right lower lung pneumonia and pleural fluid. Electronically Signed   By: Paulina Fusi M.D.   On: 10/13/2022 15:54    Scheduled Meds:  enoxaparin (LOVENOX) injection  40 mg Subcutaneous Q24H   feeding supplement  237 mL Oral BID BM   insulin aspart  0-15 Units Subcutaneous TID WC   insulin aspart  0-5 Units Subcutaneous QHS   polyethylene glycol  17 g Oral BID   sodium chloride flush  3 mL Intravenous Q12H   Continuous Infusions:  ampicillin-sulbactam (UNASYN) IV 3 g (10/15/22 0833)      LOS: 6 days   Burnadette Pop, MD [facility] P6/28/2024, 10:53 AM

## 2022-10-15 NOTE — Plan of Care (Signed)

## 2022-10-15 NOTE — Progress Notes (Signed)
Speech Language Pathology Treatment: Dysphagia  Patient Details Name: Amanda [name] MRN: [id number] DOB: Jan 28, 1946 Today's Date: 10/15/2022 Time: 4098-1191 SLP Time Calculation (min) (ACUTE ONLY): 15 min  Assessment / Plan / Recommendation Clinical Impression  Pt seen with part of lunch meal with husband feeding. Prior to admission pt was able to self feed. With her dementia she was more distracted today, fidgeting with gown, call bell and needed more encouragement to take po's. Eventually she consumed a grape and piece of honey dew melon and masticated without significant delays. Unable to take liquid via straw which does wax and wane according to husband but took liquid potassium via cup. No cough or signs of aspiration present. Husband states she is eating more now that food is regular than the puree. Educated him that she needs to be upright when eating. Also educated husband that as her dementia progressed pt could begin to hold or pocket her food and how to facilitate transit or remove food from oral cavity. Husband voiced understanding. Recommend continue regular diet texture, thin liquids. Pt's husband orders food that pt likes and can masticate and is here for the majority of meals. No further ST needed.    HPI HPI: The pt is a 77 yo female presenting 6/21 with AMS and abdominal pain. Pt found to have AKI and UTI. PMH includes: moderate-severe dementia, and dyslipidemia. CXR  Chest x-ray done today revealed right middle and lower lobe infiltrates.  Per MD note pt has difficulty taking liquids at home.      SLP Plan  All goals met;Discharge SLP treatment due to (comment)      Recommendations for follow up therapy are one component of a multi-disciplinary discharge planning process, led by the attending physician.  Recommendations may be updated based on patient status, additional functional criteria and insurance authorization.    Recommendations  Diet recommendations:  Regular;Thin liquid Liquids provided via: Cup;Straw Medication Administration: Crushed with puree Supervision: Staff to assist with self feeding;Full supervision/cueing for compensatory strategies Postural Changes and/or Swallow Maneuvers: Seated upright 90 degrees                  Oral care BID   Frequent or constant Supervision/Assistance Dysphagia, unspecified (R13.10)     All goals met;Discharge SLP treatment due to (comment)     Royce Macadamia  10/15/2022, 2:23 PM

## 2022-10-16 DIAGNOSIS — N3000 Acute cystitis without hematuria: Secondary | ICD-10-CM | POA: Diagnosis not present

## 2022-10-16 LAB — CULTURE, BLOOD (ROUTINE X 2): Culture: NO GROWTH

## 2022-10-16 LAB — CBC
HCT: 33.2 % — ABNORMAL LOW (ref 36.0–46.0)
Hemoglobin: 10.7 g/dL — ABNORMAL LOW (ref 12.0–15.0)
MCH: 28.4 pg (ref 26.0–34.0)
MCHC: 32.2 g/dL (ref 30.0–36.0)
MCV: 88.1 fL (ref 80.0–100.0)
Platelets: 375 10*3/uL (ref 150–400)
RBC: 3.77 MIL/uL — ABNORMAL LOW (ref 3.87–5.11)
RDW: 13 % (ref 11.5–15.5)
WBC: 7 10*3/uL (ref 4.0–10.5)
nRBC: 0 % (ref 0.0–0.2)

## 2022-10-16 LAB — BASIC METABOLIC PANEL
Anion gap: 9 (ref 5–15)
BUN: <5 mg/dL — ABNORMAL LOW (ref 8–23)
CO2: 23 mmol/L (ref 22–32)
Calcium: 8.3 mg/dL — ABNORMAL LOW (ref 8.9–10.3)
Chloride: 104 mmol/L (ref 98–111)
Creatinine, Ser: 0.69 mg/dL (ref 0.44–1.00)
GFR, Estimated: >60 mL/min (ref >60)
Glucose, Bld: 98 mg/dL (ref 70–99)
Potassium: 3.7 mmol/L (ref 3.5–5.1)
Sodium: 136 mmol/L (ref 135–145)

## 2022-10-16 LAB — GLUCOSE, CAPILLARY
Glucose-Capillary: 75 mg/dL (ref 70–99)
Glucose-Capillary: 80 mg/dL (ref 70–99)
Glucose-Capillary: 85 mg/dL (ref 70–99)
Glucose-Capillary: 79 mg/dL (ref 70–99)

## 2022-10-16 NOTE — Progress Notes (Signed)
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  Workup revealed right-sided pneumonia, UTI.  Urine culture grew Klebsiella pneumoniae. CXR  also showed right-sided pneumonia, currently hemodynamically stable. PT/OT recommending skilled nursing facility on discharge.  TOC following for disposition.  Medically stable for discharge whenever possible. She will finish antibiotics course today  Assessment & Plan:  Principal Problem:   UTI (urinary tract infection) Active Problems:   AMS (altered mental status)   Urinary tract infection   AKI (acute kidney injury) (HCC)   Altered mental status   Altered mental status/history of dementia: Patient follows with neurology for dementia as an outpatient.  Lives with her husband.  At baseline, confused to time and location.  Altered mental status was thought to be secondary to UTI, AKI on presentation .CT head did not show any acute intracranial abnormalities.  MRI was attempted but she could not tolerate despite premedication.  No focal neurological deficits.  Continue delirium precautions.Mentation at baseline now  AKI: Resolved  Community-acquired pneumonia: Chest x-ray showed right middle and right lower lobe infiltrate. She is currently on room air.  Respiratory status stable.No cough or SOB.  Likely this is aspiration pneumonia.  Speech therapy consulted and now has recommended regular diet.  Will complete antibiotic course today.  UTI: Urine culture shows Klebsiella.  Continue current antibiotics.  Afebrile this morning.  Deconditioning/debility: Patient seen by PT/OT and recommended SNF on discharge.AT baseline, patient is ambulatory without any problem.  Lives at home with husband.  Climbs stairs Nutrition Problem:  Increased nutrient needs Etiology: acute illness    DVT prophylaxis:enoxaparin (LOVENOX) injection 40 mg Start: 10/09/22 0800 SCDs Start: 10/08/22 2355     Code Status: Full Code  Family Communication: Discussed with daughter on phone on 6/27.Called husband and discussed on phone on 6/29  Patient status:Inpatient  Patient is from :Home  Anticipated discharge to:SNF  Estimated DC date:whenever possible   Consultants: None  Procedures:None  Antimicrobials:  Anti-infectives (From admission, onward)    Start     Dose/Rate Route Frequency Ordered Stop   10/14/22 0900  Ampicillin-Sulbactam (UNASYN) 3 g in sodium chloride 0.9 % 100 mL IVPB        3 g 200 mL/hr over 30 Minutes Intravenous Every 6 hours 10/14/22 0745     10/12/22 1200  amoxicillin-clavulanate (AUGMENTIN) 875-125 MG per tablet 1 tablet  Status:  Discontinued        1 tablet Oral Every 12 hours 10/12/22 1107 10/14/22 0735   10/11/22 1930  ceFEPIme (MAXIPIME) 2 g in sodium chloride 0.9 % 100 mL IVPB  Status:  Discontinued        2 g 200 mL/hr over 30 Minutes Intravenous Every 12 hours 10/11/22 1838 10/12/22 1107   10/11/22 1800  ceFAZolin (ANCEF) IVPB 1 g/50 mL premix  Status:  Discontinued        1 g 100 mL/hr over 30 Minutes Intravenous Every 8 hours 10/11/22 0800 10/11/22 1834   10/10/22 2100  cefTRIAXone (ROCEPHIN) 1 g in sodium chloride 0.9 % 100 mL IVPB  Status:  Discontinued        1 g 200 mL/hr over 30 Minutes Intravenous Every 24 hours 10/10/22 1537 10/11/22 0800   10/09/22 2200  cefTRIAXone (ROCEPHIN) 2 g in sodium chloride 0.9 % 100 mL IVPB  2 g 200 mL/hr over 30 Minutes Intravenous Every 24 hours 10/08/22 2316 10/10/22 0755   10/09/22 0000  cefTRIAXone (ROCEPHIN) 2 g in sodium chloride 0.9 % 100 mL IVPB  Status:  Discontinued        2 g 200 mL/hr over 30 Minutes Intravenous Every 24 hours 10/08/22 2229 10/08/22 2316   10/08/22 2145  cefTRIAXone (ROCEPHIN) 2 g in sodium chloride 0.9 % 100 mL IVPB         2 g 200 mL/hr over 30 Minutes Intravenous  Once 10/08/22 2138 10/08/22 2231       Subjective: Patient seen and examined at bedside today.  Hemodynamically stable.  Lying on the bed.  Remains confused but not in any Distress.  Talks.  Obeys commands  Objective: Vitals:   10/15/22 0715 10/15/22 1924 10/16/22 0406 10/16/22 0719  BP: 112/66 (!) 144/66 133/61 (!) 127/59  Pulse: 63 66 70 72  Resp: 18 17 17 15   Temp: 99 F (37.2 C) 98.1 F (36.7 C) 98.6 F (37 C) 98.6 F (37 C)  TempSrc:    Oral  SpO2: 98% 100% 98% 100%  Weight:      Height:        Intake/Output Summary (Last 24 hours) at 10/16/2022 1103 Last data filed at 10/16/2022 0409 Gross per 24 hour  Intake --  Output 400 ml  Net -400 ml   Filed Weights   10/08/22 2134 10/12/22 0100  Weight: 62.6 kg 61.8 kg    Examination:   General exam: Overall comfortable, not in distress,confused HEENT: PERRL Respiratory system:  no wheezes or crackles  Cardiovascular system: S1 & S2 heard, RRR.  Gastrointestinal system: Abdomen is nondistended, soft and nontender. Central nervous system: Alert and awake but not oriented Extremities: No edema, no clubbing ,no cyanosis Skin: No rashes, no ulcers,no icterus     Data Reviewed: I have personally reviewed following labs and imaging studies  CBC: Recent Labs  Lab 10/10/22 0548 10/11/22 0558 10/13/22 0440 10/14/22 0715 10/16/22 0437  WBC 10.3 11.0* 8.5 8.2 7.0  NEUTROABS  --  8.9*  --   --   --   HGB 11.8* 10.8* 10.2* 10.4* 10.7*  HCT 34.9* 33.7* 31.4* 32.1* 33.2*  MCV 86.4 88.9 86.3 86.8 88.1  PLT 164 205 265 301 375   Basic Metabolic Panel: Recent Labs  Lab 10/11/22 0558 10/12/22 0756 10/13/22 0440 10/14/22 0715 10/15/22 1749 10/16/22 0437  NA 143 142 141 136  --  136  K 3.6 3.7 3.6 3.3*  --  3.7  CL 112* 113* 107 105  --  104  CO2 21* 20* 22 22  --  23  GLUCOSE 113* 101* 95 99  --  98  BUN 10 9 10 8   --  <5*  CREATININE 0.72 0.58 0.67 0.68 0.71  0.69  CALCIUM 8.1* 8.1* 8.3* 8.1*  --  8.3*  MG 1.9  --   --   --   --   --   PHOS 2.0* 2.5  --   --   --   --      Recent Results (from the past 240 hour(s))  Urine Culture     Status: Abnormal   Collection Time: 10/08/22  9:38 PM   Specimen: Urine, Clean Catch  Result Value Ref Range Status   Specimen Description URINE, CLEAN CATCH  Final   Special Requests   Final    NONE Performed at [facility], [address] [place], [place] [address]    Culture 50,000 COLONIES/mL KLEBSIELLA PNEUMONIAE (A)  Final   Report Status 10/10/2022 FINAL  Final   Organism ID, Bacteria KLEBSIELLA PNEUMONIAE (A)  Final      Susceptibility   Klebsiella pneumoniae - MIC*    AMPICILLIN RESISTANT Resistant     CEFAZOLIN <=4 SENSITIVE Sensitive     CEFEPIME <=0.12 SENSITIVE Sensitive     CEFTRIAXONE <=0.25 SENSITIVE Sensitive     CIPROFLOXACIN <=0.25 SENSITIVE Sensitive     GENTAMICIN <=1 SENSITIVE Sensitive     IMIPENEM <=0.25 SENSITIVE Sensitive     NITROFURANTOIN 128 RESISTANT Resistant     TRIMETH/SULFA <=20 SENSITIVE Sensitive     AMPICILLIN/SULBACTAM 4 SENSITIVE Sensitive     PIP/TAZO <=4 SENSITIVE Sensitive     * 50,000 COLONIES/mL KLEBSIELLA PNEUMONIAE  Culture, blood (Routine X 2) w Reflex to ID Panel     Status: None   Collection Time: 10/11/22  3:46 PM   Specimen: BLOOD LEFT HAND  Result Value Ref Range Status   Specimen Description BLOOD LEFT HAND  Final   Special Requests   Final    BOTTLES DRAWN AEROBIC AND ANAEROBIC Blood Culture adequate volume   Culture   Final    NO GROWTH 5 DAYS Performed at [facility], [address] [place], [place] [address]    Report Status 10/16/2022 FINAL  Final  Culture, blood (Routine X 2) w Reflex to ID Panel     Status: None   Collection Time: 10/11/22  3:56 PM   Specimen: BLOOD  Result Value Ref Range Status   Specimen Description BLOOD LEFT ANTECUBITAL  Final   Special Requests   Final    BOTTLES DRAWN AEROBIC AND ANAEROBIC  Blood Culture adequate volume   Culture   Final    NO GROWTH 5 DAYS Performed at [facility], [address] [place], [place] [address]    Report Status 10/16/2022 FINAL  Final     Radiology Studies: No results found.  Scheduled Meds:  enoxaparin (LOVENOX) injection  40 mg Subcutaneous Q24H   feeding supplement  237 mL Oral BID BM   insulin aspart  0-15 Units Subcutaneous TID WC   insulin aspart  0-5 Units Subcutaneous QHS   polyethylene glycol  17 g Oral BID   sodium chloride flush  3 mL Intravenous Q12H   Continuous Infusions:  ampicillin-sulbactam (UNASYN) IV 3 g (10/16/22 0907)      LOS: 7 days   Burnadette Pop, MD [facility] P6/29/2024, 11:03 AM

## 2022-10-17 DIAGNOSIS — N3000 Acute cystitis without hematuria: Secondary | ICD-10-CM | POA: Diagnosis not present

## 2022-10-17 LAB — GLUCOSE, CAPILLARY
Glucose-Capillary: 91 mg/dL (ref 70–99)
Glucose-Capillary: 100 mg/dL — ABNORMAL HIGH (ref 70–99)
Glucose-Capillary: 98 mg/dL (ref 70–99)
Glucose-Capillary: 98 mg/dL (ref 70–99)
Glucose-Capillary: 82 mg/dL (ref 70–99)

## 2022-10-17 NOTE — Progress Notes (Signed)
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  Workup revealed right-sided pneumonia, UTI.  Urine culture grew Klebsiella pneumoniae. CXR  also showed right-sided pneumonia, currently hemodynamically stable. PT/OT recommending skilled nursing facility on discharge.  TOC following for disposition.  Medically stable for discharge whenever possible.  Husband interested to take her home instead of SNF so requesting PT reevaluation  Assessment & Plan:  Principal Problem:   UTI (urinary tract infection) Active Problems:   AMS (altered mental status)   Urinary tract infection   AKI (acute kidney injury) (HCC)   Altered mental status   Altered mental status/history of dementia: Patient follows with neurology for dementia as an outpatient.  Lives with her husband.  At baseline, confused to time and location.  Altered mental status was thought to be secondary to UTI, AKI on presentation .CT head did not show any acute intracranial abnormalities.  MRI was attempted but she could not tolerate despite premedication.  No focal neurological deficits.  Continue delirium precautions.Mentation at baseline now  AKI: Resolved  Community-acquired pneumonia: Chest x-ray showed right middle and right lower lobe infiltrate. She is currently on room air.  Respiratory status stable.No cough or SOB.  Likely this is aspiration pneumonia.  Speech therapy consulted and now has recommended regular diet.  Competed antibiotic course .  UTI: Urine culture shows Klebsiella.  Finished abx  Deconditioning/debility: Patient seen by PT/OT and recommended SNF on discharge.AT baseline, patient is ambulatory without any problem as per family..  Lives at home with husband.  Climbs stairs. Now family is  interested on taking her to home.  Requesting PT reevaluation for tomorrow  Nutrition Problem: Increased nutrient needs Etiology: acute illness    DVT prophylaxis:enoxaparin (LOVENOX) injection 40 mg Start: 10/09/22 0800 SCDs Start: 10/08/22 2355     Code Status: Full Code  Family Communication: Discussed with husband on phone on 6/30  Patient status:Inpatient  Patient is from :Home  Anticipated discharge to:SNF versus home with home health  Estimated DC date:whenever possible   Consultants: None  Procedures:None  Antimicrobials:  Anti-infectives (From admission, onward)    Start     Dose/Rate Route Frequency Ordered Stop   10/14/22 0900  Ampicillin-Sulbactam (UNASYN) 3 g in sodium chloride 0.9 % 100 mL IVPB        3 g 200 mL/hr over 30 Minutes Intravenous Every 6 hours 10/14/22 0745 10/16/22 2202   10/12/22 1200  amoxicillin-clavulanate (AUGMENTIN) 875-125 MG per tablet 1 tablet  Status:  Discontinued        1 tablet Oral Every 12 hours 10/12/22 1107 10/14/22 0735   10/11/22 1930  ceFEPIme (MAXIPIME) 2 g in sodium chloride 0.9 % 100 mL IVPB  Status:  Discontinued        2 g 200 mL/hr over 30 Minutes Intravenous Every 12 hours 10/11/22 1838 10/12/22 1107   10/11/22 1800  ceFAZolin (ANCEF) IVPB 1 g/50 mL premix  Status:  Discontinued        1 g 100 mL/hr over 30 Minutes Intravenous Every 8 hours 10/11/22 0800 10/11/22 1834   10/10/22 2100  cefTRIAXone (ROCEPHIN) 1 g in sodium chloride 0.9 % 100 mL IVPB  Status:  Discontinued        1 g 200 mL/hr over 30 Minutes Intravenous Every 24 hours 10/10/22 1537 10/11/22  0800   10/09/22 2200  cefTRIAXone (ROCEPHIN) 2 g in sodium chloride 0.9 % 100 mL IVPB        2 g 200 mL/hr over 30 Minutes Intravenous Every 24 hours 10/08/22 2316 10/10/22 0755   10/09/22 0000  cefTRIAXone (ROCEPHIN) 2 g in sodium chloride 0.9 % 100 mL IVPB  Status:  Discontinued        2 g 200 mL/hr over 30 Minutes Intravenous Every 24 hours 10/08/22 2229  10/08/22 2316   10/08/22 2145  cefTRIAXone (ROCEPHIN) 2 g in sodium chloride 0.9 % 100 mL IVPB        2 g 200 mL/hr over 30 Minutes Intravenous  Once 10/08/22 2138 10/08/22 2231       Subjective: Patient seen and examined the bedside today.  She was looked comfortable.  Alert and awake, remains confused.  Husband was feeding her.  No cough or shortness of breath.  Remains on room air.  Objective: Vitals:   10/16/22 0719 10/16/22 1914 10/17/22 0414 10/17/22 0805  BP: (!) 127/59 (!) 144/60 (!) 123/54 97/65  Pulse: 72 71 79 70  Resp: 15 17 18 16   Temp: 98.6 F (37 C) 99.4 F (37.4 C) 99.1 F (37.3 C) 97.8 F (36.6 C)  TempSrc: Oral     SpO2: 100% 100% 99% 100%  Weight:      Height:        Intake/Output Summary (Last 24 hours) at 10/17/2022 1029 Last data filed at 10/16/2022 2130 Gross per 24 hour  Intake 0 ml  Output --  Net 0 ml   Filed Weights   10/08/22 2134 10/12/22 0100  Weight: 62.6 kg 61.8 kg    Examination:   General exam: Overall comfortable, not in distress HEENT: PERRL Respiratory system:  no wheezes or crackles  Cardiovascular system: S1 & S2 heard, RRR.  Gastrointestinal system: Abdomen is nondistended, soft and nontender. Central nervous system: Alert and awake but confused Extremities: No edema, no clubbing ,no cyanosis Skin: No rashes, no ulcers,no icterus    Data Reviewed: I have personally reviewed following labs and imaging studies  CBC: Recent Labs  Lab 10/11/22 0558 10/13/22 0440 10/14/22 0715 10/16/22 0437  WBC 11.0* 8.5 8.2 7.0  NEUTROABS 8.9*  --   --   --   HGB 10.8* 10.2* 10.4* 10.7*  HCT 33.7* 31.4* 32.1* 33.2*  MCV 88.9 86.3 86.8 88.1  PLT 205 265 301 375   Basic Metabolic Panel: Recent Labs  Lab 10/11/22 0558 10/12/22 0756 10/13/22 0440 10/14/22 0715 10/15/22 1749 10/16/22 0437  NA 143 142 141 136  --  136  K 3.6 3.7 3.6 3.3*  --  3.7  CL 112* 113* 107 105  --  104  CO2 21* 20* 22 22  --  23  GLUCOSE 113* 101* 95  99  --  98  BUN 10 9 10 8   --  <5*  CREATININE 0.72 0.58 0.67 0.68 0.71 0.69  CALCIUM 8.1* 8.1* 8.3* 8.1*  --  8.3*  MG 1.9  --   --   --   --   --   PHOS 2.0* 2.5  --   --   --   --      Recent Results (from the past 240 hour(s))  Urine Culture     Status: Abnormal   Collection Time: 10/08/22  9:38 PM   Specimen: Urine, Clean Catch  Result Value Ref Range Status   Specimen Description URINE, CLEAN CATCH  Final   Special Requests   Final    NONE Performed at [facility], [address] [place], [place] [address]    Culture 50,000 COLONIES/mL KLEBSIELLA PNEUMONIAE (A)  Final   Report Status 10/10/2022 FINAL  Final   Organism ID, Bacteria KLEBSIELLA PNEUMONIAE (A)  Final      Susceptibility   Klebsiella pneumoniae - MIC*    AMPICILLIN RESISTANT Resistant     CEFAZOLIN <=4 SENSITIVE Sensitive     CEFEPIME <=0.12 SENSITIVE Sensitive     CEFTRIAXONE <=0.25 SENSITIVE Sensitive     CIPROFLOXACIN <=0.25 SENSITIVE Sensitive     GENTAMICIN <=1 SENSITIVE Sensitive     IMIPENEM <=0.25 SENSITIVE Sensitive     NITROFURANTOIN 128 RESISTANT Resistant     TRIMETH/SULFA <=20 SENSITIVE Sensitive     AMPICILLIN/SULBACTAM 4 SENSITIVE Sensitive     PIP/TAZO <=4 SENSITIVE Sensitive     * 50,000 COLONIES/mL KLEBSIELLA PNEUMONIAE  Culture, blood (Routine X 2) w Reflex to ID Panel     Status: None   Collection Time: 10/11/22  3:46 PM   Specimen: BLOOD LEFT HAND  Result Value Ref Range Status   Specimen Description BLOOD LEFT HAND  Final   Special Requests   Final    BOTTLES DRAWN AEROBIC AND ANAEROBIC Blood Culture adequate volume   Culture   Final    NO GROWTH 5 DAYS Performed at [facility], [address], [place], [place] [address]    Report Status 10/16/2022 FINAL  Final  Culture, blood (Routine X 2) w Reflex to ID Panel     Status: None   Collection Time: 10/11/22  3:56 PM   Specimen: BLOOD  Result Value Ref Range Status   Specimen Description BLOOD LEFT ANTECUBITAL   Final   Special Requests   Final    BOTTLES DRAWN AEROBIC AND ANAEROBIC Blood Culture adequate volume   Culture   Final    NO GROWTH 5 DAYS Performed at [facility], [address], [place], [place] [address]    Report Status 10/16/2022 FINAL  Final     Radiology Studies: No results found.  Scheduled Meds:  enoxaparin (LOVENOX) injection  40 mg Subcutaneous Q24H   feeding supplement  237 mL Oral BID BM   insulin aspart  0-15 Units Subcutaneous TID WC   insulin aspart  0-5 Units Subcutaneous QHS   polyethylene glycol  17 g Oral BID   sodium chloride flush  3 mL Intravenous Q12H   Continuous Infusions:      LOS: 8 days   Burnadette Pop, MD [facility] P6/30/2024, 10:29 AM

## 2022-10-18 DIAGNOSIS — N3000 Acute cystitis without hematuria: Secondary | ICD-10-CM | POA: Diagnosis not present

## 2022-10-18 LAB — BASIC METABOLIC PANEL
Anion gap: 9 (ref 5–15)
BUN: <5 mg/dL — ABNORMAL LOW (ref 8–23)
CO2: 25 mmol/L (ref 22–32)
Calcium: 8.4 mg/dL — ABNORMAL LOW (ref 8.9–10.3)
Chloride: 103 mmol/L (ref 98–111)
Creatinine, Ser: 0.69 mg/dL (ref 0.44–1.00)
GFR, Estimated: >60 mL/min (ref >60)
Glucose, Bld: 104 mg/dL — ABNORMAL HIGH (ref 70–99)
Potassium: 3.6 mmol/L (ref 3.5–5.1)
Sodium: 137 mmol/L (ref 135–145)

## 2022-10-18 LAB — CBC
HCT: 33.9 % — ABNORMAL LOW (ref 36.0–46.0)
Hemoglobin: 11 g/dL — ABNORMAL LOW (ref 12.0–15.0)
MCH: 27.6 pg (ref 26.0–34.0)
MCHC: 32.4 g/dL (ref 30.0–36.0)
MCV: 85.2 fL (ref 80.0–100.0)
Platelets: 489 10*3/uL — ABNORMAL HIGH (ref 150–400)
RBC: 3.98 MIL/uL (ref 3.87–5.11)
RDW: 12.8 % (ref 11.5–15.5)
WBC: 6.3 10*3/uL (ref 4.0–10.5)
nRBC: 0 % (ref 0.0–0.2)

## 2022-10-18 LAB — GLUCOSE, CAPILLARY
Glucose-Capillary: 107 mg/dL — ABNORMAL HIGH (ref 70–99)
Glucose-Capillary: 94 mg/dL (ref 70–99)
Glucose-Capillary: 86 mg/dL (ref 70–99)
Glucose-Capillary: 75 mg/dL (ref 70–99)

## 2022-10-18 NOTE — Progress Notes (Signed)
Physical Therapy Treatment Patient Details Name: Amanda [name] MRN: [id number] DOB: 1946-01-11 Today's Date: 10/18/2022   History of Present Illness The pt is a 77 yo female presenting 6/21 with AMS and abdominal pain. Pt found to have AKI and UTI. PMH includes: moderate-severe dementia, and dyslipidemia.    PT Comments  The pt was received in bed, spouse and daughter present for session and able to elicit significant improvements in patient participation and mobility. With daughter's encouragement, pt completing bed mobility with minA, and was able to maintain sitting EOB with minG. The pt's ability to complete sit-stand transfers and stand-pivot transfers fluctuated with presence of family, with +2 HHA, blocking of bilateral knees, and assist at hips/trunk, the pt was able to complete sit-stand transfers and small steps to recliner with assist from PT and spouse. The pt does demo significantly improved participation with use of familiar music and family, but still required mod-maxA of 2 to manage sit-stand transfers and any steps in room at this time. The pt will benefit from continued rehab to facilitate return to prior level of mobility (independence with ambulation and able to navigate a flight of stairs without assist).     Assistance Recommended at Discharge Frequent or constant Supervision/Assistance  If plan is discharge home, recommend the following:  Can travel by private vehicle    Two people to help with bathing/dressing/bathroom;Assistance with feeding;Assist for transportation;Help with stairs or ramp for entrance;Two people to help with walking and/or transfers   No  Equipment Recommendations  None recommended by PT    Recommendations for Other Services       Precautions / Restrictions Precautions Precautions: Fall Precaution Comments: does best with family present Restrictions Weight Bearing Restrictions: No     Mobility  Bed Mobility Overal bed mobility:  Needs Assistance Bed Mobility: Supine to Sit     Supine to sit: HOB elevated, Min guard, Min assist     General bed mobility comments: minA of pt pulling on her daughter to elevate trunk, minA to scoot to EOB    Transfers Overall transfer level: Needs assistance Equipment used: 2 person hand held assist Transfers: Sit to/from Stand, Bed to chair/wheelchair/BSC Sit to Stand: +2 physical assistance, Mod assist, Total assist, Max assist   Step pivot transfers: Max assist, Mod assist, +2 physical assistance       General transfer comment: pt resistant to standing without family present, able to generate minimal hip lift despite maxA of 2 and blocking of bilateral knees/feet. With husband assisting, pt initiating sit-stand and able to manage small lateral steps to recliner    Ambulation/Gait               General Gait Details: limited to small lateral steps. pt with posterior lean and minimal step clearance despite cues and assist     Balance Overall balance assessment: Needs assistance Sitting-balance support: No upper extremity supported, Feet supported Sitting balance-Leahy Scale: Fair Sitting balance - Comments: min guard A EOB   Standing balance support: During functional activity, Bilateral upper extremity supported Standing balance-Leahy Scale: Zero Standing balance comment: modA to maxA of 2 to achieve full stand, posterior lean                            Cognition Arousal/Alertness: Awake/alert Behavior During Therapy: WFL for tasks assessed/performed Overall Cognitive Status: History of cognitive impairments - at baseline  General Comments: Following commands intermittently, best with family encouragement. pt mumbling and difficult to understand. did not verbally answer any direct questions in session. 60s music does improve affect and mood        Exercises Other Exercises Other Exercises: discussed  AROM for LE with daughter to encourage outside of session    General Comments General comments (skin integrity, edema, etc.): pt spouse and daughter present, discussed post-acute rehab at length with SW as well.      Pertinent Vitals/Pain Pain Assessment Pain Assessment: No/denies pain Faces Pain Scale: No hurt Pain Intervention(s): Monitored during session     PT Goals (current goals can now be found in the care plan section) Acute Rehab PT Goals Patient Stated Goal: none stated PT Goal Formulation: Patient unable to participate in goal setting Time For Goal Achievement: 10/23/22 Potential to Achieve Goals: Fair Progress towards PT goals: Progressing toward goals    Frequency    Min 3X/week      PT Plan Current plan remains appropriate       AM-PAC PT "6 Clicks" Mobility   Outcome Measure  Help needed turning from your back to your side while in a flat bed without using bedrails?: A Little Help needed moving from lying on your back to sitting on the side of a flat bed without using bedrails?: A Little Help needed moving to and from a bed to a chair (including a wheelchair)?: Total Help needed standing up from a chair using your arms (e.g., wheelchair or bedside chair)?: Total Help needed to walk in hospital room?: Total Help needed climbing 3-5 steps with a railing? : Total 6 Click Score: 10    End of Session Equipment Utilized During Treatment: Gait belt Activity Tolerance: Patient tolerated treatment well Patient left: with family/visitor present;in chair;with call bell/phone within reach;with chair alarm set Nurse Communication: Mobility status PT Visit Diagnosis: Other abnormalities of gait and mobility (R26.89);Muscle weakness (generalized) (M62.81)     Time: 0160-1093 PT Time Calculation (min) (ACUTE ONLY): 54 min  Charges:    $Therapeutic Exercise: 23-37 mins $Therapeutic Activity: 8-22 mins $Self Care/Home Management: 8-22 PT General Charges $$  ACUTE PT VISIT: 1 Visit                     Vickki Muff, PT, DPT   Acute Rehabilitation Department Office [phone] Secure Chat Communication Preferred   Ronnie Derby 10/18/2022, 1:58 PM

## 2022-10-18 NOTE — TOC Progression Note (Signed)
Transition of Care [facility]) - Progression Note    Patient Details  Name: Amanda [name] MRN: [id number] Date of Birth: 11/18/1945  Transition of Care [facility]) CM/SW Contact  Miklos Bidinger A [place], [place] Phone Number: 10/18/2022, 2:16 PM  Clinical Narrative:     CSW met with pt, pt's daughter and PT to facilitate disposition  concerns. Pt's daughter, [name] discussed concerns with pt being at an unfamiliar facility and her not progressing. PT said that pt may do better with family being able to visit at facility and perform PT/OT therapy if they are present and pt could get stronger potentially faster.   CSW said that since they are visiting facility again, asking questions about visiting hours, etc during visit could be useful.   CSW said that insurance authorization could be started since they have chosen the facility at [facility] and even in the even they declined SNF there would be no issue.   CSW then proceeded to contact [employer] regarding request for insurance authorization to [facility].   TOC will continue to follow.     Expected Discharge Plan: Skilled Nursing Facility Barriers to Discharge: Continued Medical Work up  Expected Discharge Plan and Services     Post Acute Care Choice: Skilled Nursing Facility Living arrangements for the past 2 months: Single Family Home                                       Social Determinants of Health (SDOH) Interventions SDOH Screenings   Food Insecurity: No Food Insecurity (10/09/2022)  Housing: Low Risk  (10/18/2022)  Transportation Needs: No Transportation Needs (10/09/2022)  Utilities: Not At Risk (10/09/2022)  Depression (PHQ2-9): Low Risk  (09/14/2022)  Financial Resource Strain: Low Risk  (09/14/2022)  Physical Activity: Inactive (09/14/2022)  Stress: No Stress Concern Present (09/14/2022)  Tobacco Use: Low Risk  (10/08/2022)    Readmission Risk Interventions     No data to display

## 2022-10-18 NOTE — TOC Progression Note (Signed)
Transition of Care [facility]) - Progression Note    Patient Details  Name: Amanda [name] MRN: [id number] Date of Birth: March 06, 1946  Transition of Care [facility]) CM/SW Contact  Nader Boys A [place], [place] Phone Number: 10/18/2022, 4:53 PM  Clinical Narrative:     CSW met with pt's daughter [name] and husband, [name] at bedside. They said they had spoken with Katie at the facility and were aware that pt no longer has a bed at [facility].  CSW provided updated bed offers list and they stated they would reach out to [employer] liaison, Soy regarding bed availability and possible tour.  CSW will reach out in the AM regarding bed acceptance.   TOC will continue to follow.    Expected Discharge Plan: Skilled Nursing Facility Barriers to Discharge: Continued Medical Work up  Expected Discharge Plan and Services     Post Acute Care Choice: Skilled Nursing Facility Living arrangements for the past 2 months: Single Family Home                                       Social Determinants of Health (SDOH) Interventions SDOH Screenings   Food Insecurity: No Food Insecurity (10/09/2022)  Housing: Low Risk  (10/18/2022)  Transportation Needs: No Transportation Needs (10/09/2022)  Utilities: Not At Risk (10/09/2022)  Depression (PHQ2-9): Low Risk  (09/14/2022)  Financial Resource Strain: Low Risk  (09/14/2022)  Physical Activity: Inactive (09/14/2022)  Stress: No Stress Concern Present (09/14/2022)  Tobacco Use: Low Risk  (10/08/2022)    Readmission Risk Interventions     No data to display

## 2022-10-18 NOTE — TOC Progression Note (Signed)
Transition of Care [facility]) - Progression Note    Patient Details  Name: Amanda [name] MRN: [id number] Date of Birth: 04-15-1946  Transition of Care [facility]) CM/SW Contact  Bernece Gall A [place], [place] Phone Number: 10/18/2022, 2:42 PM  Clinical Narrative:     CSW reached out to Katie at [facility] to follow up on bed offer. She confirmed pt's bed offer, but said that she has had 4 facility residents admit from the hospital and needs to follow up with CSW regarding bed availability this week. She said she would follow up with CSW about beds later this afternoon.   TOC will continue to follow.   Expected Discharge Plan: Skilled Nursing Facility Barriers to Discharge: Continued Medical Work up  Expected Discharge Plan and Services     Post Acute Care Choice: Skilled Nursing Facility Living arrangements for the past 2 months: Single Family Home                                       Social Determinants of Health (SDOH) Interventions SDOH Screenings   Food Insecurity: No Food Insecurity (10/09/2022)  Housing: Low Risk  (10/18/2022)  Transportation Needs: No Transportation Needs (10/09/2022)  Utilities: Not At Risk (10/09/2022)  Depression (PHQ2-9): Low Risk  (09/14/2022)  Financial Resource Strain: Low Risk  (09/14/2022)  Physical Activity: Inactive (09/14/2022)  Stress: No Stress Concern Present (09/14/2022)  Tobacco Use: Low Risk  (10/08/2022)    Readmission Risk Interventions     No data to display

## 2022-10-18 NOTE — Progress Notes (Signed)
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  Workup revealed right-sided pneumonia, UTI.  Urine culture grew Klebsiella pneumoniae. CXR  also showed right-sided pneumonia, currently hemodynamically stable. PT/OT recommending skilled nursing facility on discharge.  TOC following for disposition.  Medically stable for discharge whenever possible.    Assessment & Plan:  Principal Problem:   UTI (urinary tract infection) Active Problems:   AMS (altered mental status)   Urinary tract infection   AKI (acute kidney injury) (HCC)   Altered mental status   Altered mental status/history of dementia: Patient follows with neurology for dementia as an outpatient.  Lives with her husband.  At baseline, confused to time and location.  Altered mental status was thought to be secondary to UTI, AKI on presentation .CT head did not show any acute intracranial abnormalities.  MRI was attempted but she could not tolerate despite premedication.  No focal neurological deficits.  Continue delirium precautions.Mentation at baseline now  AKI: Resolved  Community-acquired pneumonia: Chest x-ray showed right middle and right lower lobe infiltrate. She is currently on room air.  Respiratory status stable.No cough or SOB.  Likely this is aspiration pneumonia.  Speech therapy consulted and now has recommended regular diet.  Competed antibiotic course .  UTI: Urine culture shows Klebsiella.  Finished abx  Deconditioning/debility: Patient seen by PT/OT and recommended SNF on discharge.AT baseline, as per family patient is ambulatory without any problem as per family.Lives at home with husband.  Climbs stairs.  Nutrition Problem: Increased nutrient needs Etiology: acute illness    DVT  prophylaxis:enoxaparin (LOVENOX) injection 40 mg Start: 10/09/22 0800 SCDs Start: 10/08/22 2355     Code Status: Full Code  Family Communication: Discussed with daughter /husband at length at bedside on 7/1  Patient status:Inpatient  Patient is from :Home  Anticipated discharge to:SNF   Estimated DC date:whenever possible   Consultants: None  Procedures:None  Antimicrobials:  Anti-infectives (From admission, onward)    Start     Dose/Rate Route Frequency Ordered Stop   10/14/22 0900  Ampicillin-Sulbactam (UNASYN) 3 g in sodium chloride 0.9 % 100 mL IVPB        3 g 200 mL/hr over 30 Minutes Intravenous Every 6 hours 10/14/22 0745 10/16/22 2202   10/12/22 1200  amoxicillin-clavulanate (AUGMENTIN) 875-125 MG per tablet 1 tablet  Status:  Discontinued        1 tablet Oral Every 12 hours 10/12/22 1107 10/14/22 0735   10/11/22 1930  ceFEPIme (MAXIPIME) 2 g in sodium chloride 0.9 % 100 mL IVPB  Status:  Discontinued        2 g 200 mL/hr over 30 Minutes Intravenous Every 12 hours 10/11/22 1838 10/12/22 1107   10/11/22 1800  ceFAZolin (ANCEF) IVPB 1 g/50 mL premix  Status:  Discontinued        1 g 100 mL/hr over 30 Minutes Intravenous Every 8 hours 10/11/22 0800 10/11/22 1834   10/10/22 2100  cefTRIAXone (ROCEPHIN) 1 g in sodium chloride 0.9 % 100 mL IVPB  Status:  Discontinued        1 g 200 mL/hr over 30 Minutes Intravenous Every 24 hours 10/10/22 1537 10/11/22 0800   10/09/22 2200  cefTRIAXone (ROCEPHIN) 2 g in sodium chloride 0.9 % 100 mL IVPB        2 g  200 mL/hr over 30 Minutes Intravenous Every 24 hours 10/08/22 2316 10/10/22 0755   10/09/22 0000  cefTRIAXone (ROCEPHIN) 2 g in sodium chloride 0.9 % 100 mL IVPB  Status:  Discontinued        2 g 200 mL/hr over 30 Minutes Intravenous Every 24 hours 10/08/22 2229 10/08/22 2316   10/08/22 2145  cefTRIAXone (ROCEPHIN) 2 g in sodium chloride 0.9 % 100 mL IVPB        2 g 200 mL/hr over 30 Minutes Intravenous  Once 10/08/22 2138  10/08/22 2231       Subjective: Patient seen and examined the bedside this morning she was comfortable.  Lying in bed.  No cough or shortness of breath.  Has been on room air since last several days.  Not in any current distress. Patient again seen at the bedside for discussion with family .  Made clear to family that patient does not more than 7 days of antibiotics.  She has already completed antibiotics course.  All the questions were answered in detail and made it clear that patient is medically stable for discharge  Vitals:   10/17/22 1634 10/17/22 2107 10/18/22 0551 10/18/22 0743  BP: (!) 123/55 (!) 156/57 (!) 119/51 (!) 125/94  Pulse: 64 71 66 64  Resp: 16 16 18 18   Temp: 97.8 F (36.6 C) 98.5 F (36.9 C) 98.7 F (37.1 C) 98.4 F (36.9 C)  TempSrc:      SpO2: 100%  99% 98%  Weight:      Height:        Intake/Output Summary (Last 24 hours) at 10/18/2022 1404 Last data filed at 10/18/2022 0500 Gross per 24 hour  Intake --  Output 225 ml  Net -225 ml   Filed Weights   10/08/22 2134 10/12/22 0100  Weight: 62.6 kg 61.8 kg    Examination:   General exam: Overall comfortable, not in distress HEENT: PERRL Respiratory system:  no wheezes or crackles  Cardiovascular system: S1 & S2 heard, RRR.  Gastrointestinal system: Abdomen is nondistended, soft and nontender. Central nervous system: Alert and awake,confused Extremities: No edema, no clubbing ,no cyanosis Skin: No rashes, no ulcers,no icterus    Data Reviewed: I have personally reviewed following labs and imaging studies  CBC: Recent Labs  Lab 10/13/22 0440 10/14/22 0715 10/16/22 0437 10/18/22 0734  WBC 8.5 8.2 7.0 6.3  HGB 10.2* 10.4* 10.7* 11.0*  HCT 31.4* 32.1* 33.2* 33.9*  MCV 86.3 86.8 88.1 85.2  PLT 265 301 375 489*   Basic Metabolic Panel: Recent Labs  Lab 10/12/22 0756 10/13/22 0440 10/14/22 0715 10/15/22 1749 10/16/22 0437 10/18/22 0734  NA 142 141 136  --  136 137  K 3.7 3.6 3.3*  --  3.7  3.6  CL 113* 107 105  --  104 103  CO2 20* 22 22  --  23 25  GLUCOSE 101* 95 99  --  98 104*  BUN 9 10 8   --  <5* <5*  CREATININE 0.58 0.67 0.68 0.71 0.69 0.69  CALCIUM 8.1* 8.3* 8.1*  --  8.3* 8.4*  PHOS 2.5  --   --   --   --   --      Recent Results (from the past 240 hour(s))  Urine Culture     Status: Abnormal   Collection Time: 10/08/22  9:38 PM   Specimen: Urine, Clean Catch  Result Value Ref Range Status   Specimen Description URINE, CLEAN CATCH  Final  Special Requests   Final    NONE Performed at [facility], [address] [place], [place] [address]    Culture 50,000 COLONIES/mL KLEBSIELLA PNEUMONIAE (A)  Final   Report Status 10/10/2022 FINAL  Final   Organism ID, Bacteria KLEBSIELLA PNEUMONIAE (A)  Final      Susceptibility   Klebsiella pneumoniae - MIC*    AMPICILLIN RESISTANT Resistant     CEFAZOLIN <=4 SENSITIVE Sensitive     CEFEPIME <=0.12 SENSITIVE Sensitive     CEFTRIAXONE <=0.25 SENSITIVE Sensitive     CIPROFLOXACIN <=0.25 SENSITIVE Sensitive     GENTAMICIN <=1 SENSITIVE Sensitive     IMIPENEM <=0.25 SENSITIVE Sensitive     NITROFURANTOIN 128 RESISTANT Resistant     TRIMETH/SULFA <=20 SENSITIVE Sensitive     AMPICILLIN/SULBACTAM 4 SENSITIVE Sensitive     PIP/TAZO <=4 SENSITIVE Sensitive     * 50,000 COLONIES/mL KLEBSIELLA PNEUMONIAE  Culture, blood (Routine X 2) w Reflex to ID Panel     Status: None   Collection Time: 10/11/22  3:46 PM   Specimen: BLOOD LEFT HAND  Result Value Ref Range Status   Specimen Description BLOOD LEFT HAND  Final   Special Requests   Final    BOTTLES DRAWN AEROBIC AND ANAEROBIC Blood Culture adequate volume   Culture   Final    NO GROWTH 5 DAYS Performed at [facility], [address], [place], [place] [address]    Report Status 10/16/2022 FINAL  Final  Culture, blood (Routine X 2) w Reflex to ID Panel     Status: None   Collection Time: 10/11/22  3:56 PM   Specimen: BLOOD  Result Value Ref Range  Status   Specimen Description BLOOD LEFT ANTECUBITAL  Final   Special Requests   Final    BOTTLES DRAWN AEROBIC AND ANAEROBIC Blood Culture adequate volume   Culture   Final    NO GROWTH 5 DAYS Performed at [facility], [address] [place], [place] [address]    Report Status 10/16/2022 FINAL  Final     Radiology Studies: No results found.  Scheduled Meds:  enoxaparin (LOVENOX) injection  40 mg Subcutaneous Q24H   feeding supplement  237 mL Oral BID BM   insulin aspart  0-15 Units Subcutaneous TID WC   insulin aspart  0-5 Units Subcutaneous QHS   polyethylene glycol  17 g Oral BID   sodium chloride flush  3 mL Intravenous Q12H   Continuous Infusions:      LOS: 9 days   Burnadette Pop, MD [facility] P7/04/2022, 2:04 PM

## 2022-10-18 NOTE — Plan of Care (Signed)
  Problem: Fluid Volume: Goal: Ability to maintain a balanced intake and output will improve Outcome: Progressing   Problem: Metabolic: Goal: Ability to maintain appropriate glucose levels will improve Outcome: Progressing   Problem: Tissue Perfusion: Goal: Adequacy of tissue perfusion will improve Outcome: Progressing   Problem: Clinical Measurements: Goal: Ability to maintain clinical measurements within normal limits will improve Outcome: Progressing Goal: Will remain free from infection Outcome: Progressing Goal: Diagnostic test results will improve Outcome: Progressing Goal: Respiratory complications will improve Outcome: Progressing Goal: Cardiovascular complication will be avoided Outcome: Progressing   Problem: Elimination: Goal: Will not experience complications related to bowel motility Outcome: Progressing Goal: Will not experience complications related to urinary retention Outcome: Progressing   Problem: Safety: Goal: Ability to remain free from injury will improve Outcome: Progressing   Problem: Education: Goal: Ability to describe self-care measures that may prevent or decrease complications (Diabetes Survival Skills Education) will improve Outcome: Not Progressing Goal: Individualized Educational Video(s) Outcome: Not Progressing   Problem: Coping: Goal: Ability to adjust to condition or change in health will improve Outcome: Not Progressing   Problem: Health Behavior/Discharge Planning: Goal: Ability to identify and utilize available resources and services will improve Outcome: Not Progressing Goal: Ability to manage health-related needs will improve Outcome: Not Progressing   Problem: Nutritional: Goal: Maintenance of adequate nutrition will improve Outcome: Not Progressing Goal: Progress toward achieving an optimal weight will improve Outcome: Not Progressing   Problem: Skin Integrity: Goal: Risk for impaired skin integrity will  decrease Outcome: Not Progressing   Problem: Education: Goal: Knowledge of General Education information will improve Description: Including pain rating scale, medication(s)/side effects and non-pharmacologic comfort measures Outcome: Not Progressing   Problem: Health Behavior/Discharge Planning: Goal: Ability to manage health-related needs will improve Outcome: Not Progressing   Problem: Activity: Goal: Risk for activity intolerance will decrease Outcome: Not Progressing   Problem: Nutrition: Goal: Adequate nutrition will be maintained Outcome: Not Progressing   Problem: Coping: Goal: Level of anxiety will decrease Outcome: Not Progressing   Problem: Pain Managment: Goal: General experience of comfort will improve Outcome: Not Progressing   Problem: Skin Integrity: Goal: Risk for impaired skin integrity will decrease Outcome: Not Progressing

## 2022-10-18 NOTE — Progress Notes (Signed)
Attempted to feed patient and she declined food, drink, and Ensure at this time.

## 2022-10-18 NOTE — TOC Progression Note (Signed)
Transition of Care [facility]) - Progression Note    Patient Details  Name: Amanda [name] MRN: [id number] Date of Birth: 1945/10/24  [facility]) CM/SW Contact  Celese Banner A [place], [place] Phone Number: 10/18/2022, 3:33 PM  Clinical Narrative:     CSW received contact from Katie at [facility] regarding bed offer update. She stated that with her 4 patients from their facility in the hospital she is unable to honor and has to rescind bed offer given to pt.   She said she will reach out to pt's family and make them aware of the situation.   CSW will follow up with family on other facilities potentially for placement selection as well as reach out to [employer] regarding change in facility for authorization.   TOC will continue to follow.    Expected Discharge Plan: Skilled Nursing Facility Barriers to Discharge: Continued Medical Work up  Expected Discharge Plan and Services     Post Acute Care Choice: Skilled Nursing Facility Living arrangements for the past 2 months: Single Family Home                                       Social Determinants of Health (SDOH) Interventions SDOH Screenings   Food Insecurity: No Food Insecurity (10/09/2022)  Housing: Low Risk  (10/18/2022)  Transportation Needs: No Transportation Needs (10/09/2022)  Utilities: Not At Risk (10/09/2022)  Depression (PHQ2-9): Low Risk  (09/14/2022)  Financial Resource Strain: Low Risk  (09/14/2022)  Physical Activity: Inactive (09/14/2022)  Stress: No Stress Concern Present (09/14/2022)  Tobacco Use: Low Risk  (10/08/2022)    Readmission Risk Interventions     No data to display

## 2022-10-19 DIAGNOSIS — N3000 Acute cystitis without hematuria: Secondary | ICD-10-CM | POA: Diagnosis not present

## 2022-10-19 LAB — GLUCOSE, CAPILLARY
Glucose-Capillary: 111 mg/dL — ABNORMAL HIGH (ref 70–99)
Glucose-Capillary: 127 mg/dL — ABNORMAL HIGH (ref 70–99)
Glucose-Capillary: 70 mg/dL (ref 70–99)
Glucose-Capillary: 89 mg/dL (ref 70–99)
Glucose-Capillary: 99 mg/dL (ref 70–99)

## 2022-10-19 MED ORDER — ACETAMINOPHEN 325 MG PO TABS: 650.0000 mg | ORAL_TABLET | Freq: Four times a day (QID) | ORAL | Status: DC | PRN

## 2022-10-19 MED ORDER — ENSURE ENLIVE PO LIQD: 237.0000 mL | Freq: Two times a day (BID) | ORAL | 12 refills | Status: DC

## 2022-10-19 MED ORDER — POLYETHYLENE GLYCOL 3350 17 G PO PACK: 17.0000 g | PACK | Freq: Every day | ORAL | 0 refills | Status: DC

## 2022-10-19 NOTE — TOC Progression Note (Signed)
Transition of Care [facility]) - Progression Note    Patient Details  Name: Amanda [name] MRN: [id number] Date of Birth: [birth year]  Transition of Care [facility]) CM/SW Contact  Zariah Jost A [place], [place] Phone Number: 10/19/2022, 5:02 PM  Clinical Narrative:     CSW was contacted by Junious Dresser [phone], at [facility]. CSW was informed that pt's ambulance transportation was approved, [id number].   Junious Dresser said that pt's authorization for SNF is still be reviewed by the Medical Director and [facility] staff will update CSW when decided.   TOC will continue to follow.    Expected Discharge Plan: Skilled Nursing Facility Barriers to Discharge: Continued Medical Work up  Expected Discharge Plan and Services     Post Acute Care Choice: Skilled Nursing Facility Living arrangements for the past 2 months: Single Family Home Expected Discharge Date: 10/19/22                                     Social Determinants of Health (SDOH) Interventions SDOH Screenings   Food Insecurity: No Food Insecurity (10/09/2022)  Housing: Low Risk  (10/18/2022)  Transportation Needs: No Transportation Needs (10/09/2022)  Utilities: Not At Risk (10/09/2022)  Depression (PHQ2-9): Low Risk  (09/14/2022)  Financial Resource Strain: Low Risk  (09/14/2022)  Physical Activity: Inactive (09/14/2022)  Stress: No Stress Concern Present (09/14/2022)  Tobacco Use: Low Risk  (10/08/2022)    Readmission Risk Interventions     No data to display

## 2022-10-19 NOTE — Discharge Summary (Addendum)
Physician Discharge Summary  Amanda [name]:[id number] DOB: [birth year] DOA: 10/08/2022  PCP: [name], MD  Admit date: 10/08/2022 Discharge date: 10/25/2022  Admitted From: Home Disposition: Skilled nursing facility.  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 Workup revealed right-sided pneumonia, UTI. Urine culture grew Klebsiella pneumoniae. CXR also showed right-sided pneumonia, currently hemodynamically stable.She completed antibiotics course. PT/OT recommending skilled nursing facility on discharge.   10/25/2022: Patient will be discharged to skilled nursing facility (short-term).  Following problems were addressed during the hospitalization:  Altered mental status/history of dementia: Patient follows with neurology for dementia as an outpatient.  Lives with her husband.  At baseline, confused to time and location.  Altered mental status was thought to be secondary to UTI, AKI on presentation .CT head did not show any acute intracranial abnormalities.  MRI was attempted but she could not tolerate despite premedication.  No focal neurological deficits.  Continue delirium precautions.Mentation at baseline now   AKI: Resolved   Community-acquired pneumonia: Chest x-ray showed right middle and right lower lobe infiltrate. She is currently on room air.  Respiratory status stable.No cough or SOB.  Likely this is aspiration pneumonia.  Speech therapy consulted and now has recommended regular diet.  Competed antibiotic course .  Do a  follow-up chest x-ray in 4 weeks   UTI: Urine culture shows Klebsiella.  Finished abx   Deconditioning/debility: Patient seen by PT/OT and recommended SNF on discharge.AT baseline, as per family patient is ambulatory without any problem as per  family.Lives at home with husband.  Climbs stairs.   Discharge Diagnoses:  Principal Problem:   UTI (urinary tract infection) Active Problems:   AMS (altered mental status)   Urinary tract infection   AKI (acute kidney injury) (HCC)   Altered mental status    Discharge Instructions  Discharge Instructions     Diet general   Complete by: As directed    Discharge instructions   Complete by: As directed    1)Please do a follow up chest xray: AP and lateral view on 4 weeks   Increase activity slowly   Complete by: As directed       Allergies as of 10/19/2022   No Known Allergies      Medication List     TAKE these medications    acetaminophen 325 MG tablet Commonly known as: TYLENOL Take 2 tablets (650 mg total) by mouth every 6 (six) hours as needed for moderate pain or fever.   feeding supplement Liqd Take 237 mLs by mouth 2 (two) times daily between meals.   polyethylene glycol 17 g packet Commonly known as: MIRALAX / GLYCOLAX Take 17 g by mouth daily.        No Known Allergies  Consultations: None   Procedures/Studies: DG CHEST PORT 1 VIEW  Result Date: 10/13/2022 CLINICAL DATA:  Pneumonia.  Follow-up. EXAM: PORTABLE CHEST 1 VIEW COMPARISON:  10/11/2022 FINDINGS: Heart size remains normal. Aortic atherosclerotic calcification is seen. The left lung is clear. No left effusion. Persistent and possibly worsened right lower lung pneumonia and pleural fluid. Right lung apex is clear. IMPRESSION: Persistent and possibly worsened right lower lung pneumonia and pleural fluid. Electronically Signed   By: Paulina Fusi M.D.   On: 10/13/2022 15:54   DG CHEST PORT 1 VIEW  Result Date: 10/11/2022 CLINICAL DATA:  Fever. EXAM: PORTABLE CHEST  1 VIEW COMPARISON:  One-view chest x-ray 03/26/2017 FINDINGS: Heart is mildly enlarged. Atherosclerotic calcifications are present at the aortic arch. Right middle and lower lobe airspace opacity is new. Right pleural effusion is  suspected. The left lung is clear. The visualized soft tissues and bony thorax are unremarkable. IMPRESSION: 1. New right middle and lower lobe airspace disease consistent with pneumonia. 2. Probable right pleural effusion. Electronically Signed   By: Marin Roberts M.D.   On: 10/11/2022 16:26   DG Pelvis Portable  Result Date: 10/08/2022 CLINICAL DATA:  weak EXAM: PORTABLE PELVIS 1-2 VIEWS COMPARISON:  None Available. FINDINGS: There is no evidence of pelvic fracture or diastasis. No acute displaced fracture or dislocation of either hips on frontal view. No pelvic bone lesions are seen. IMPRESSION: Negative. Electronically Signed   By: Tish Frederickson M.D.   On: 10/08/2022 21:01   DG Chest Port 1 View  Result Date: 10/08/2022 CLINICAL DATA:  Weakness. EXAM: PORTABLE CHEST 1 VIEW COMPARISON:  03/26/2017 FINDINGS: Heart size and mediastinal contours are unremarkable. Aortic atherosclerotic calcifications identified. Decreased lung volumes with slight asymmetric elevation of the right hemidiaphragm. No pleural fluid, interstitial edema or airspace disease. No acute osseous findings. IMPRESSION: Low lung volumes. No acute findings. Electronically Signed   By: Signa Kell M.D.   On: 10/08/2022 18:10   CT ABDOMEN PELVIS WO CONTRAST  Result Date: 10/08/2022 CLINICAL DATA:  Constipation for 3 days, abdominal pain, dementia EXAM: CT ABDOMEN AND PELVIS WITHOUT CONTRAST TECHNIQUE: Multidetector CT imaging of the abdomen and pelvis was performed following the standard protocol without IV contrast. RADIATION DOSE REDUCTION: This exam was performed according to the departmental dose-optimization program which includes automated exposure control, adjustment of the mA and/or kV according to patient size and/or use of iterative reconstruction technique. COMPARISON:  09/07/2022 FINDINGS: Lower chest: Dependent hypoventilatory changes. No acute pleural or parenchymal lung disease. Hepatobiliary: Calcified  gallstones without cholecystitis. Unremarkable unenhanced appearance of the liver. Pancreas: Unremarkable unenhanced appearance. Spleen: Unremarkable unenhanced appearance. Adrenals/Urinary Tract: No urinary tract calculi or obstructive uropathy. The adrenals are stable. Bladder is unremarkable. Stomach/Bowel: No bowel obstruction or ileus. Moderate retained stool within the rectal vault without evidence of stercoral colitis. No bowel wall thickening or inflammatory change. Vascular/Lymphatic: Aortic atherosclerosis. No enlarged abdominal or pelvic lymph nodes. Reproductive: Status post hysterectomy. No adnexal masses. Other: No free fluid or free intraperitoneal gas. No abdominal wall hernia. Musculoskeletal: No acute or destructive bony abnormalities. Reconstructed images demonstrate no additional findings. IMPRESSION: 1. Cholelithiasis without cholecystitis. 2. Moderate retained stool within the rectal vault which could reflect fecal impaction. No evidence of stercoral colitis. 3.  Aortic Atherosclerosis (ICD10-I70.0). Electronically Signed   By: Sharlet Salina M.D.   On: 10/08/2022 18:07   CT Head Wo Contrast  Result Date: 10/08/2022 CLINICAL DATA:  History of dementia with mental status change of unknown etiology. EXAM: CT HEAD WITHOUT CONTRAST TECHNIQUE: Contiguous axial images were obtained from the base of the skull through the vertex without intravenous contrast. RADIATION DOSE REDUCTION: This exam was performed according to the departmental dose-optimization program which includes automated exposure control, adjustment of the mA and/or kV according to patient size and/or use of iterative reconstruction technique. COMPARISON:  05/22/2021 FINDINGS: Brain: No evidence of acute infarction, hemorrhage, hydrocephalus, extra-axial collection or mass lesion/mass effect. There is mild diffuse low-attenuation within the subcortical and periventricular white matter compatible with chronic microvascular disease.  Prominence of the sulci and ventricles. Vascular: No hyperdense vessel or unexpected calcification. Skull: Normal. Negative  for fracture or focal lesion. Sinuses/Orbits: No acute finding. Other: No scalp hematoma IMPRESSION: 1. No acute intracranial abnormalities. 2. Chronic microvascular disease and brain atrophy. Electronically Signed   By: Signa Kell M.D.   On: 10/08/2022 18:06      Subjective: Patient seen and examined at bedside today.  Hemodynamically stable on room air.  Not coughing.  Not in any Distress.  She was being fed.  I have discussed with the husband and daughter about discharge planning in detail on 7/1  Discharge Exam: Vitals:   10/19/22 0541 10/19/22 0734  BP: (!) 121/51 (!) 120/51  Pulse: 77 72  Resp: 18   Temp: 99.7 F (37.6 C) 98.7 F (37.1 C)  SpO2: 96% 99%   Vitals:   10/18/22 1721 10/18/22 2039 10/19/22 0541 10/19/22 0734  BP: 132/60 128/60 (!) 121/51 (!) 120/51  Pulse: 77 83 77 72  Resp: 18 18 18    Temp: 98.5 F (36.9 C) 98.4 F (36.9 C) 99.7 F (37.6 C) 98.7 F (37.1 C)  TempSrc:  Oral    SpO2: 100% 98% 96% 99%  Weight:      Height:        General: Pt is alert, awake, not in acute distress, confused Cardiovascular: RRR, S1/S2 +, no rubs, no gallops Respiratory: CTA bilaterally, no wheezing, no rhonchi Abdominal: Soft, NT, ND, bowel sounds + Extremities: no edema, no cyanosis    The results of significant diagnostics from this hospitalization (including imaging, microbiology, ancillary and laboratory) are listed below for reference.     Microbiology: Recent Results (from the past 240 hour(s))  Culture, blood (Routine X 2) w Reflex to ID Panel     Status: None   Collection Time: 10/11/22  3:46 PM   Specimen: BLOOD LEFT HAND  Result Value Ref Range Status   Specimen Description BLOOD LEFT HAND  Final   Special Requests   Final    BOTTLES DRAWN AEROBIC AND ANAEROBIC Blood Culture adequate volume   Culture   Final    NO GROWTH 5  DAYS Performed at [facility], [address] [place], [place] [address]    Report Status 10/16/2022 FINAL  Final  Culture, blood (Routine X 2) w Reflex to ID Panel     Status: None   Collection Time: 10/11/22  3:56 PM   Specimen: BLOOD  Result Value Ref Range Status   Specimen Description BLOOD LEFT ANTECUBITAL  Final   Special Requests   Final    BOTTLES DRAWN AEROBIC AND ANAEROBIC Blood Culture adequate volume   Culture   Final    NO GROWTH 5 DAYS Performed at [facility], [address] [place], [place] [address]    Report Status 10/16/2022 FINAL  Final     Labs: BNP (last 3 results) No results for input(s): "BNP" in the last 8760 hours. Basic Metabolic Panel: Recent Labs  Lab 10/13/22 0440 10/14/22 0715 10/15/22 1749 10/16/22 0437 10/18/22 0734  NA 141 136  --  136 137  K 3.6 3.3*  --  3.7 3.6  CL 107 105  --  104 103  CO2 22 22  --  23 25  GLUCOSE 95 99  --  98 104*  BUN 10 8  --  <5* <5*  CREATININE 0.67 0.68 0.71 0.69 0.69  CALCIUM 8.3* 8.1*  --  8.3* 8.4*   Liver Function Tests: No results for input(s): "AST", "ALT", "ALKPHOS", "BILITOT", "PROT", "ALBUMIN" in the last 168 hours.  No results for input(s): "LIPASE", "AMYLASE" in the last 168 hours. No results for input(s): "AMMONIA" in the last 168 hours. CBC: Recent Labs  Lab 10/13/22 0440 10/14/22 0715 10/16/22 0437 10/18/22 0734  WBC 8.5 8.2 7.0 6.3  HGB 10.2* 10.4* 10.7* 11.0*  HCT 31.4* 32.1* 33.2* 33.9*  MCV 86.3 86.8 88.1 85.2  PLT 265 301 375 489*   Cardiac Enzymes: No results for input(s): "CKTOTAL", "CKMB", "CKMBINDEX", "TROPONINI" in the last 168 hours. BNP: Invalid input(s): "POCBNP" CBG: Recent Labs  Lab 10/18/22 1210 10/18/22 1723 10/18/22 2045 10/19/22 0012 10/19/22 0753  GLUCAP 94 86 75 89 99   D-Dimer No results for input(s): "DDIMER" in the last 72 hours. Hgb A1c No results for input(s): "HGBA1C" in the last 72 hours. Lipid Profile No results for  input(s): "CHOL", "HDL", "LDLCALC", "TRIG", "CHOLHDL", "LDLDIRECT" in the last 72 hours. Thyroid function studies No results for input(s): "TSH", "T4TOTAL", "T3FREE", "THYROIDAB" in the last 72 hours.  Invalid input(s): "FREET3" Anemia work up No results for input(s): "VITAMINB12", "FOLATE", "FERRITIN", "TIBC", "IRON", "RETICCTPCT" in the last 72 hours. Urinalysis    Component Value Date/Time   COLORURINE AMBER (A) 10/08/2022 2053   APPEARANCEUR HAZY (A) 10/08/2022 2053   LABSPEC 1.024 10/08/2022 2053   PHURINE 5.0 10/08/2022 2053   GLUCOSEU NEGATIVE 10/08/2022 2053   HGBUR MODERATE (A) 10/08/2022 2053   BILIRUBINUR NEGATIVE 10/08/2022 2053   KETONESUR 5 (A) 10/08/2022 2053   PROTEINUR NEGATIVE 10/08/2022 2053   NITRITE POSITIVE (A) 10/08/2022 2053   LEUKOCYTESUR LARGE (A) 10/08/2022 2053   Sepsis Labs Recent Labs  Lab 10/13/22 0440 10/14/22 0715 10/16/22 0437 10/18/22 0734  WBC 8.5 8.2 7.0 6.3   Microbiology Recent Results (from the past 240 hour(s))  Culture, blood (Routine X 2) w Reflex to ID Panel     Status: None   Collection Time: 10/11/22  3:46 PM   Specimen: BLOOD LEFT HAND  Result Value Ref Range Status   Specimen Description BLOOD LEFT HAND  Final   Special Requests   Final    BOTTLES DRAWN AEROBIC AND ANAEROBIC Blood Culture adequate volume   Culture   Final    NO GROWTH 5 DAYS Performed at [facility], [address] [place], [place] [address]    Report Status 10/16/2022 FINAL  Final  Culture, blood (Routine X 2) w Reflex to ID Panel     Status: None   Collection Time: 10/11/22  3:56 PM   Specimen: BLOOD  Result Value Ref Range Status   Specimen Description BLOOD LEFT ANTECUBITAL  Final   Special Requests   Final    BOTTLES DRAWN AEROBIC AND ANAEROBIC Blood Culture adequate volume   Culture   Final    NO GROWTH 5 DAYS Performed at [facility], [address] [place], [place] [address]    Report Status 10/16/2022 FINAL  Final     Please note: You were cared for by a hospitalist during your hospital stay. Once you are discharged, your primary care physician will handle any further medical issues. Please note that NO REFILLS for any discharge medications will be authorized once you are discharged, as it is imperative that you return to your primary care physician (or establish a relationship with a primary care physician if you do not have one) for your post hospital discharge needs so that they can reassess your need for medications and monitor your lab values.    Time coordinating discharge: 40 minutes  SIGNED:   Burnadette Pop, MD  [facility] 10/19/2022, 10:35 AM Pager [phone]  If 7PM-7AM, please contact night-coverage [email or web address] Password [email or web address]  Addendum done by Dr. Dartha Lodge. -This addendum is done on 10/25/2022. -After further discussion with the patient's husband and daughter, PT OT and transition of care team, patient will be discharged to a skilled nursing facility (short-term). -Please continue calorie count at the skilled nursing facility. -Patient will follow-up with primary care provider within 1 week of discharge. -No significant changes noted since the last discharge summary was done. -This will serve as a final discharge summary.  Modena Morrow, MD

## 2022-10-19 NOTE — TOC Progression Note (Signed)
Transition of Care [facility]) - Progression Note    Patient Details  Name: Amanda [name] MRN: [id number] Date of Birth: [birth year]  Transition of Care [facility]) CM/SW Contact  Arron Tetrault A [place], [place] Phone Number: 10/19/2022, 10:57 AM  Clinical Narrative:     CSW contacted pt's daughter, [name] and she stated that were going to visit [facility] today. CSW provided contact information for Kenney Houseman, liaison at facility. She also said she was interested in [employer] [facility]. CSW will follow up regarding decision for SNF selection. CSW informed them that decision must be made today as pt is medically stable for discharge.   CSW will update SNF location for insurance authorization once facility has been selected.   TOC will continue to follow.    Expected Discharge Plan: Skilled Nursing Facility Barriers to Discharge: Continued Medical Work up  Expected Discharge Plan and Services     Post Acute Care Choice: Skilled Nursing Facility Living arrangements for the past 2 months: Single Family Home Expected Discharge Date: 10/19/22                                     Social Determinants of Health (SDOH) Interventions SDOH Screenings   Food Insecurity: No Food Insecurity (10/09/2022)  Housing: Low Risk  (10/18/2022)  Transportation Needs: No Transportation Needs (10/09/2022)  Utilities: Not At Risk (10/09/2022)  Depression (PHQ2-9): Low Risk  (09/14/2022)  Financial Resource Strain: Low Risk  (09/14/2022)  Physical Activity: Inactive (09/14/2022)  Stress: No Stress Concern Present (09/14/2022)  Tobacco Use: Low Risk  (10/08/2022)    Readmission Risk Interventions     No data to display

## 2022-10-20 DIAGNOSIS — R410 Disorientation, unspecified: Secondary | ICD-10-CM | POA: Diagnosis not present

## 2022-10-20 DIAGNOSIS — N179 Acute kidney failure, unspecified: Secondary | ICD-10-CM | POA: Diagnosis not present

## 2022-10-20 DIAGNOSIS — N3000 Acute cystitis without hematuria: Secondary | ICD-10-CM | POA: Diagnosis not present

## 2022-10-20 LAB — GLUCOSE, CAPILLARY
Glucose-Capillary: 104 mg/dL — ABNORMAL HIGH (ref 70–99)
Glucose-Capillary: 93 mg/dL (ref 70–99)
Glucose-Capillary: 80 mg/dL (ref 70–99)

## 2022-10-20 NOTE — TOC Progression Note (Addendum)
Transition of Care [facility]) - Progression Note    Patient Details  Name: Amanda [name] MRN: [id number] Date of Birth: [birth year]  Transition of Care [facility]) CM/SW Contact  Jermarcus Mcfadyen A [place], [place] Phone Number: 10/20/2022, 3:31 PM  Clinical Narrative:     1700 CSW provided denial letter to family regarding decision of denial of short term rehab for insurance. CSW highlighted appeal information. Family said that they are going to decide if they want to appeal and is going to follow up with provider regarding appropriateness of appeal.   Pt's daughter asked for information for grievance department. CSW provided information for Patient Relations: [phone]  TOC will continue to follow.   1635 CSW was asked by Junious Dresser at [facility] to speak with [name]. CSW contacted provider regarding pt's authorization. He said that he would keep pt's denial in place for short term rehab for 7-14 days given pt's current level of participation in therapy. He said that that an appeal could be made and that denial letter can be provided to family.  1620 CSW updated provider regarding deliberation and opportunity for appeal. Provider informed CSW that provider is not going to follow up with appeal as PT recommendation needs reevaluating and if potential denial is in place, does not have enough to sway decision.  1600 CSW received denial letter from [employer]. Then CSW received phone call from [place] regarding denial letter and to hold off giving to family because  [name] wanted to deliberate appeal as there was updated documentation. CSW informed provider.  1520 CSW received phone call from Waldron at [facility]. She stated that pt's provider did not complete peer 2 peer. She faxed CSW the denial letter.   She requested TOC fax number, which was provided, to send denial letter. CSW will follow up with pt, family and provider regarding HTA decision and information for appeal  process if interested. Once fax is received CSW will follow up with pt's family and provider with appeal information.    0830 CSW sent secure chat message to provider [place] regarding Peer to Peer for [employer] with Dr. Lovena Le, the medical director, [phone]. Deadline at 2pm. Regarding pt's SNF authorization.   TOC will continue to follow.  Expected Discharge Plan: Skilled Nursing Facility Barriers to Discharge: Continued Medical Work up  Expected Discharge Plan and Services     Post Acute Care Choice: Skilled Nursing Facility Living arrangements for the past 2 months: Single Family Home Expected Discharge Date: 10/19/22                                     Social Determinants of Health (SDOH) Interventions SDOH Screenings   Food Insecurity: No Food Insecurity (10/09/2022)  Housing: Low Risk  (10/18/2022)  Transportation Needs: No Transportation Needs (10/09/2022)  Utilities: Not At Risk (10/09/2022)  Depression (PHQ2-9): Low Risk  (09/14/2022)  Financial Resource Strain: Low Risk  (09/14/2022)  Physical Activity: Inactive (09/14/2022)  Stress: No Stress Concern Present (09/14/2022)  Tobacco Use: Low Risk  (10/08/2022)    Readmission Risk Interventions     No data to display

## 2022-10-20 NOTE — Progress Notes (Signed)
Physical Therapy Treatment Patient Details Name: Amanda [name] MRN: [id number] DOB: 05-24-1945 Today's Date: 10/20/2022   History of Present Illness The pt is a 77 yo female presenting 6/21 with AMS and abdominal pain. Pt found to have AKI and UTI. PMH includes: moderate-severe dementia, and dyslipidemia.    PT Comments  Pt greeted resting in bed without family at bedside. Pt continues to benefit from music throughout session for improved mood and participation. Pt with decreased participation this session with no family present, however able to come to sit EOB with min A to elevate trunk and scoot out to EOB.  Pt unable to come to stand this session with gross multimodal cues, and pt returning self to supine. Pt positioned to comfort in bed and music left on for increased auditory/mental stimuli in room. Current plan remains appropriate to address deficits and maximize functional independence and decrease caregiver burden. Pt continues to benefit from skilled PT services to progress toward functional mobility goals.      Assistance Recommended at Discharge Frequent or constant Supervision/Assistance  If plan is discharge home, recommend the following:  Can travel by private vehicle    Two people to help with bathing/dressing/bathroom;Assistance with feeding;Assist for transportation;Help with stairs or ramp for entrance;Two people to help with walking and/or transfers   No  Equipment Recommendations  None recommended by PT    Recommendations for Other Services       Precautions / Restrictions Precautions Precautions: Fall Precaution Comments: does best with family present Restrictions Weight Bearing Restrictions: No     Mobility  Bed Mobility Overal bed mobility: Needs Assistance Bed Mobility: Supine to Sit, Sit to Supine     Supine to sit: HOB elevated, Min assist Sit to supine: Max assist   General bed mobility comments: min A to elevate trunk minA to scoot to EOB     Transfers                   General transfer comment: without family present pt beginning to lay back down and resistant to standing trials.    Ambulation/Gait                   Stairs             Wheelchair Mobility     Tilt Bed    Modified Rankin (Stroke Patients Only)       Balance Overall balance assessment: Needs assistance Sitting-balance support: No upper extremity supported, Feet supported Sitting balance-Leahy Scale: Fair Sitting balance - Comments: min guard A EOB                                    Cognition Arousal/Alertness: Awake/alert Behavior During Therapy: WFL for tasks assessed/performed Overall Cognitive Status: History of cognitive impairments - at baseline                                 General Comments: Following commands intermittently, best with family encouragement. however family not present this session and RN reporting has not been in today, pt with mumbling throughout session and difficult to understand. music much improves mood!        Exercises      General Comments General comments (skin integrity, edema, etc.): no family at bedside this session and pt with decreased participation  Pertinent Vitals/Pain Pain Assessment Pain Assessment: No/denies pain Pain Intervention(s): Monitored during session    Home Living                          Prior Function            PT Goals (current goals can now be found in the care plan section) Acute Rehab PT Goals Patient Stated Goal: none stated PT Goal Formulation: Patient unable to participate in goal setting Time For Goal Achievement: 10/23/22 Progress towards PT goals: Progressing toward goals    Frequency    Min 3X/week      PT Plan Current plan remains appropriate    Co-evaluation              AM-PAC PT "6 Clicks" Mobility   Outcome Measure  Help needed turning from your back to your side  while in a flat bed without using bedrails?: A Little Help needed moving from lying on your back to sitting on the side of a flat bed without using bedrails?: A Little Help needed moving to and from a bed to a chair (including a wheelchair)?: Total Help needed standing up from a chair using your arms (e.g., wheelchair or bedside chair)?: Total Help needed to walk in hospital room?: Total Help needed climbing 3-5 steps with a railing? : Total 6 Click Score: 10    End of Session   Activity Tolerance: Patient tolerated treatment well;Other (comment) (limited by cognition) Patient left: with call bell/phone within reach;in bed;with bed alarm set Nurse Communication: Mobility status PT Visit Diagnosis: Other abnormalities of gait and mobility (R26.89);Muscle weakness (generalized) (M62.81)     Time: 1610-9604 PT Time Calculation (min) (ACUTE ONLY): 11 min  Charges:    $Therapeutic Activity: 8-22 mins PT General Charges $$ ACUTE PT VISIT: 1 Visit                     Caoimhe Damron R. PTA Acute Rehabilitation Services Office: [phone]   Catalina Antigua 10/20/2022, 2:45 PM

## 2022-10-21 DIAGNOSIS — N390 Urinary tract infection, site not specified: Secondary | ICD-10-CM | POA: Diagnosis not present

## 2022-10-21 LAB — GLUCOSE, CAPILLARY
Glucose-Capillary: 100 mg/dL — ABNORMAL HIGH (ref 70–99)
Glucose-Capillary: 82 mg/dL (ref 70–99)

## 2022-10-21 NOTE — Plan of Care (Signed)

## 2022-10-21 NOTE — Progress Notes (Signed)
PROGRESS NOTE  Amanda [name] ZOX:[id number] DOB: 04/29/1945 DOA: 10/08/2022 PCP: [name], MD  Brief History   Patient is a 77 year old female with history of moderate to severe dementia, hyperlipidemia who initially presented with abdominal pain, fever. Workup revealed right-sided pneumonia, UTI. Urine culture grew Klebsiella pneumoniae. CXR also showed right-sided pneumonia, currently hemodynamically stable.  Plan was for discharge to SNF. Pt will need re-evaluation by PT for this to take place. The patient lives at home with her spouse Hervey Ard is an elderly man in a home with stairs. It is anticipated that the patient will not do well in this environment. Insurance is denying the patient's going to rehab at this point.   Interval History/Subjective  The patient is weak and frail. She is resting comfortably. No new complaints. She is pleasantly confused.  Objective   Vitals:  Vitals:   10/20/22 1934 10/21/22 0427  BP: (!) 124/51 (!) 142/98  Pulse: 76 94  Resp: 16 15  Temp: 97.8 F (36.6 C) 99.3 F (37.4 C)  SpO2: 99% 96%     Scheduled Meds:  enoxaparin (LOVENOX) injection  40 mg Subcutaneous Q24H   feeding supplement  237 mL Oral BID BM   insulin aspart  0-15 Units Subcutaneous TID WC   insulin aspart  0-5 Units Subcutaneous QHS   polyethylene glycol  17 g Oral BID   sodium chloride flush  3 mL Intravenous Q12H   Continuous Infusions:  Principal Problem:   UTI (urinary tract infection) Active Problems:   AMS (altered mental status)   Urinary tract infection   AKI (acute kidney injury) (HCC)   Altered mental status  A & P  Altered mental status/history of dementia: Patient follows with neurology for dementia as an outpatient.  Lives with her husband.  At baseline, confused to time and location.  Altered mental status was thought to be secondary to UTI, AKI on presentation .CT head did not show any acute intracranial abnormalities.  MRI was attempted but she  could not tolerate despite premedication.  No focal neurological deficits.  Continue delirium precautions.Mentation at baseline now   AKI: Resolved   Community-acquired pneumonia: Chest x-ray showed right middle and right lower lobe infiltrate. She is currently on room air.  Respiratory status stable.No cough or SOB.  Likely this is aspiration pneumonia.  Speech therapy consulted and now has recommended regular diet.  Competed antibiotic course .   UTI: Urine culture shows Klebsiella.  Finished abx  Diarrhea: The patient's daughter has related that the patient is havign loose stools. These have not been recorded or mentioned by nursing. The patient and her daughter state that this has happened 3 times today. Will check stool culture and consider loperamide should the patient's stooling rises to that level.   Deconditioning/debility: Patient seen by PT/OT. She will need rehab at dc on discharge. The patient lives at home with her spouse. Although she has previously been fairly independent, she is currently requiring 2 person assist with standing, transfers, and taking stairs. There are stairs in her home. She currently has insufficient assistance at home to be safe. PT has been asked to re-evaluate the patient for rehab placement.   Nutrition Problem: Increased nutrient needs Etiology: acute illness   DVT prophylaxis:enoxaparin (LOVENOX) injection 40 mg Start: 10/09/22 0800 SCDs Start: 10/08/22 2355       Code Status: Full Code   Family Communication: Discussed with daughter at length on the phone on 10/19/2022.  Patient status:Inpatient   Patient  is from :Home   Anticipated discharge to:SNF    Estimated DC date:whenever possible     Consultants: None   Procedures:None   Antimicrobials:      Santoria Chason, DO [facility] Direct contact: see [email or web address]  7PM-7AM contact night coverage as above 10/20/2022, 6:30 PM  LOS: 12 days     LOS: 12 days

## 2022-10-21 NOTE — Progress Notes (Signed)
PROGRESS NOTE  KONI SAVIN NWG:[id number] DOB: Jul 27, 1945 DOA: 10/08/2022 PCP: [name], MD  Brief History   Patient is a 77 year old female with history of moderate to severe dementia, hyperlipidemia who initially presented with abdominal pain, fever. Workup revealed right-sided pneumonia, UTI. Urine culture grew Klebsiella pneumoniae. CXR also showed right-sided pneumonia, currently hemodynamically stable.  Plan was for discharge to SNF. Pt will need re-evaluation by PT for this to take place. The patient lives at home with her spouse Hervey Ard is an elderly man in a home with stairs. It is anticipated that the patient will not do well in this environment. Insurance is denying the patient's going to rehab at this point.   10/21/2022: Patient seen alongside patient's husband and daughter.  Patient looks much better today.  Patient is awake and alert and quite interactive.  Memory deficit persist.  Awaiting possible SNF placement.  Interval History/Subjective  -Patient seen. -Patient is awake and alert. -Patient is unable to give coherent history. Objective   Vitals:  Vitals:   10/21/22 0739 10/21/22 1941  BP: (!) 115/46 122/65  Pulse: 78 76  Resp: 17 15  Temp: 98.2 F (36.8 C)   SpO2: 96% 94%     Scheduled Meds:  enoxaparin (LOVENOX) injection  40 mg Subcutaneous Q24H   feeding supplement  237 mL Oral BID BM   polyethylene glycol  17 g Oral BID   sodium chloride flush  3 mL Intravenous Q12H   Continuous Infusions:  Principal Problem:   UTI (urinary tract infection) Active Problems:   AMS (altered mental status)   Urinary tract infection   AKI (acute kidney injury) (HCC)   Altered mental status  A & P  Altered mental status/history of dementia: Patient follows with neurology for dementia as an outpatient.  Lives with her husband.  At baseline, confused to time and location.  Altered mental status was thought to be secondary to UTI, AKI on presentation .CT head  did not show any acute intracranial abnormalities.  MRI was attempted but she could not tolerate despite premedication.  No focal neurological deficits.  Continue delirium precautions.Mentation at baseline now 10/21/2022: Resolved significantly.   AKI: Resolved   Community-acquired pneumonia: Chest x-ray showed right middle and right lower lobe infiltrate. She is currently on room air.  Respiratory status stable.No cough or SOB.  Likely this is aspiration pneumonia.  Speech therapy consulted and now has recommended regular diet.  Competed antibiotic course .   UTI: Urine culture shows Klebsiella.  Finished abx  Diarrhea: The patient's daughter has related that the patient is havign loose stools. These have not been recorded or mentioned by nursing. The patient and her daughter state that this has happened 3 times today. Will check stool culture and consider loperamide should the patient's stooling rises to that level.   Deconditioning/debility: Patient seen by PT/OT. She will need rehab at dc on discharge. The patient lives at home with her spouse. Although she has previously been fairly independent, she is currently requiring 2 person assist with standing, transfers, and taking stairs. There are stairs in her home. She currently has insufficient assistance at home to be safe. PT has been asked to re-evaluate the patient for rehab placement.   Nutrition Problem: Increased nutrient needs Etiology: acute illness   DVT prophylaxis:enoxaparin (LOVENOX) injection 40 mg Start: 10/09/22 0800 SCDs Start: 10/08/22 2355       Code Status: Full Code   Family Communication: Discussed with daughter at length on the  phone on 10/19/2022.  Patient status:Inpatient   Patient is from :Home   Anticipated discharge to:SNF    Estimated DC date:whenever possible     Consultants: None   Procedures:None   Antimicrobials:      Berton Mount MD. [facility] Direct contact: see [email or web address]   7PM-7AM contact night coverage as above 10/20/2022, 6:30 PM  LOS: 12 days     LOS: 12 days

## 2022-10-22 DIAGNOSIS — N390 Urinary tract infection, site not specified: Secondary | ICD-10-CM | POA: Diagnosis not present

## 2022-10-22 LAB — GLUCOSE, CAPILLARY
Glucose-Capillary: 85 mg/dL (ref 70–99)
Glucose-Capillary: 134 mg/dL — ABNORMAL HIGH (ref 70–99)

## 2022-10-22 LAB — CREATININE, SERUM
Creatinine, Ser: 0.73 mg/dL (ref 0.44–1.00)
GFR, Estimated: >60 mL/min (ref >60)

## 2022-10-22 NOTE — TOC Progression Note (Signed)
Transition of Care [facility]) - Progression Note    Patient Details  Name: Amanda [name] MRN: [id number] Date of Birth: [birth year]  Transition of Care [facility]) CM/SW Contact  Jazzma Neidhardt A [place], [place] Phone Number: 10/22/2022, 4:32 PM  Clinical Narrative:     CSW received contact from [place] [facility] who confirmed pt was approved for 10 days to [facility] and must be at facility within 5 business of approval, starting today, at 10/22/22.   Auth ID: [id number]  Ambulance ID: [id number]  Pt bed available at [facility] Monday.   TOC will continue to follow.   Expected Discharge Plan: Skilled Nursing Facility Barriers to Discharge: Continued Medical Work up  Expected Discharge Plan and Services     Post Acute Care Choice: Skilled Nursing Facility Living arrangements for the past 2 months: Single Family Home Expected Discharge Date: 10/19/22                                     Social Determinants of Health (SDOH) Interventions SDOH Screenings   Food Insecurity: No Food Insecurity (10/09/2022)  Housing: Low Risk  (10/18/2022)  Transportation Needs: No Transportation Needs (10/09/2022)  Utilities: Not At Risk (10/09/2022)  Depression (PHQ2-9): Low Risk  (09/14/2022)  Financial Resource Strain: Low Risk  (09/14/2022)  Physical Activity: Inactive (09/14/2022)  Stress: No Stress Concern Present (09/14/2022)  Tobacco Use: Low Risk  (10/08/2022)    Readmission Risk Interventions     No data to display

## 2022-10-22 NOTE — TOC Progression Note (Addendum)
Transition of Care [facility]) - Progression Note    Patient Details  Name: Amanda [name] MRN: [id number] Date of Birth: 12/12/1945  Transition of Care [facility]) CM/SW Contact  Wagner Tanzi A [place], [place] Phone Number: 10/22/2022, 10:42 AM  Clinical Narrative:     CSW spoke with Dr. Dartha Lodge to update him on pt's insurance status and goal recommendation. He stated he would assist with appeal process  CSW provided information for Dr. Lovena Le, [phone] at [facility] [employer] to Dr Dartha Lodge to appeal the denial from insurance.   Provider updated CSW that [name] would be able to approve the SNF stay and that CSW would need to [employer] request.   CSW contacted Junious Dresser to start insurance authorization at [employer], per previous conversations with pt's family about being the new selected facility.   She stated she would start the process over again and update CSW regarding authorization approval information.    CSW reached out to [employer] regarding bed availability today for possible discharge.   TOC will continue to follow.    Expected Discharge Plan: Skilled Nursing Facility Barriers to Discharge: Continued Medical Work up  Expected Discharge Plan and Services     Post Acute Care Choice: Skilled Nursing Facility Living arrangements for the past 2 months: Single Family Home Expected Discharge Date: 10/19/22                                     Social Determinants of Health (SDOH) Interventions SDOH Screenings   Food Insecurity: No Food Insecurity (10/09/2022)  Housing: Low Risk  (10/18/2022)  Transportation Needs: No Transportation Needs (10/09/2022)  Utilities: Not At Risk (10/09/2022)  Depression (PHQ2-9): Low Risk  (09/14/2022)  Financial Resource Strain: Low Risk  (09/14/2022)  Physical Activity: Inactive (09/14/2022)  Stress: No Stress Concern Present (09/14/2022)  Tobacco Use: Low Risk  (10/08/2022)    Readmission Risk Interventions     No data to  display

## 2022-10-22 NOTE — Progress Notes (Signed)
PROGRESS NOTE  Amanda [name] ZOX:[id number] DOB: 11-Jun-1945 DOA: 10/08/2022 PCP: [name], MD  Brief History   Patient is a 77 year old female with history of moderate to severe dementia, hyperlipidemia who initially presented with abdominal pain, fever. Workup revealed right-sided pneumonia, UTI. Urine culture grew Klebsiella pneumoniae. CXR also showed right-sided pneumonia, currently hemodynamically stable.  Plan was for discharge to SNF. Pt will need re-evaluation by PT for this to take place. The patient lives at home with her spouse Hervey Ard is an elderly man in a home with stairs. It is anticipated that the patient will not do well in this environment. Insurance is denying the patient's going to rehab at this point.   10/21/2022: Patient seen alongside patient's husband and daughter.  Patient looks much better today.  Patient is awake and alert and quite interactive.  Memory deficit persist.  Awaiting possible SNF placement.  10/22/2022: Patient seen.  No new changes.  Authorization has been approved.  Likely discharge on Monday, 10/25/2022.  Interval History/Subjective  -Patient seen. -No new changes. -Patient is unable to give coherent history. Objective   Vitals:  Vitals:   10/22/22 0533 10/22/22 0731  BP: (!) 131/59 (!) 115/58  Pulse: 80 74  Resp: 15   Temp: 98.2 F (36.8 C) 97.9 F (36.6 C)  SpO2: 97% 99%     Scheduled Meds:  enoxaparin (LOVENOX) injection  40 mg Subcutaneous Q24H   feeding supplement  237 mL Oral BID BM   polyethylene glycol  17 g Oral BID   sodium chloride flush  3 mL Intravenous Q12H   Continuous Infusions:  Principal Problem:   UTI (urinary tract infection) Active Problems:   AMS (altered mental status)   Urinary tract infection   AKI (acute kidney injury) (HCC)   Altered mental status  A & P  Altered mental status/history of dementia: Patient follows with neurology for dementia as an outpatient.  Lives with her husband.  At  baseline, confused to time and location.  Altered mental status was thought to be secondary to UTI, AKI on presentation .CT head did not show any acute intracranial abnormalities.  MRI was attempted but she could not tolerate despite premedication.  No focal neurological deficits.  Continue delirium precautions.Mentation at baseline now 10/21/2022: Resolved significantly.   AKI: Resolved   Community-acquired pneumonia: Chest x-ray showed right middle and right lower lobe infiltrate. She is currently on room air.  Respiratory status stable.No cough or SOB.  Likely this is aspiration pneumonia.  Speech therapy consulted and now has recommended regular diet.  Competed antibiotic course .   UTI: Urine culture shows Klebsiella.  Finished abx  Diarrhea: The patient's daughter has related that the patient is havign loose stools. These have not been recorded or mentioned by nursing. The patient and her daughter state that this has happened 3 times today. Will check stool culture and consider loperamide should the patient's stooling rises to that level.   Deconditioning/debility: Patient seen by PT/OT. She will need rehab at dc on discharge. The patient lives at home with her spouse. Although she has previously been fairly independent, she is currently requiring 2 person assist with standing, transfers, and taking stairs. There are stairs in her home. She currently has insufficient assistance at home to be safe. PT has been asked to re-evaluate the patient for rehab placement.   Nutrition Problem: Increased nutrient needs Etiology: acute illness   DVT prophylaxis:enoxaparin (LOVENOX) injection 40 mg Start: 10/09/22 0800 SCDs Start: 10/08/22 2355  Code Status: Full Code   Family Communication: Discussed with daughter at length on the phone on 10/19/2022.  Patient status:Inpatient   Patient is from :Home   Anticipated discharge to:SNF    Estimated DC date:whenever possible     Consultants:  None   Procedures:None   Antimicrobials:      Berton Mount MD. [facility] Direct contact: see [email or web address]  7PM-7AM contact night coverage as above 10/20/2022, 6:30 PM  LOS: 12 days     LOS: 13 days

## 2022-10-22 NOTE — TOC Progression Note (Signed)
Transition of Care [facility]) - Progression Note    Patient Details  Name: Amanda [name] MRN: [id number] Date of Birth: 15-Oct-1945  [facility]) CM/SW Contact  Keary Hanak A [place], [place] Phone Number: 10/22/2022, 3:56 PM  Clinical Narrative:     CSW contacted pt's daughter, [name] and updated on pt's progression towards discharge.   She said that she had spoken with Dr. Dartha Lodge and was made aware of verbal approval based on his impression of the conversation with [name].   CSW updated that there was no insurance approval documentation provided to CSW  as of yet but would update family as soon as information was known.   CSW informed them that bed at [facility] available Monday and that ambulance transportation has been approved by insurance.   Auth ID for ambulance: [id number]  No other requests were made.TOC will continue to follow.   Expected Discharge Plan: Skilled Nursing Facility Barriers to Discharge: Continued Medical Work up  Expected Discharge Plan and Services     Post Acute Care Choice: Skilled Nursing Facility Living arrangements for the past 2 months: Single Family Home Expected Discharge Date: 10/19/22                                     Social Determinants of Health (SDOH) Interventions SDOH Screenings   Food Insecurity: No Food Insecurity (10/09/2022)  Housing: Low Risk  (10/18/2022)  Transportation Needs: No Transportation Needs (10/09/2022)  Utilities: Not At Risk (10/09/2022)  Depression (PHQ2-9): Low Risk  (09/14/2022)  Financial Resource Strain: Low Risk  (09/14/2022)  Physical Activity: Inactive (09/14/2022)  Stress: No Stress Concern Present (09/14/2022)  Tobacco Use: Low Risk  (10/08/2022)    Readmission Risk Interventions     No data to display

## 2022-10-22 NOTE — Plan of Care (Signed)
  Problem: Skin Integrity: Goal: Risk for impaired skin integrity will decrease Outcome: Progressing   Problem: Tissue Perfusion: Goal: Adequacy of tissue perfusion will improve Outcome: Progressing   Problem: Clinical Measurements: Goal: Respiratory complications will improve Outcome: Progressing Goal: Cardiovascular complication will be avoided Outcome: Progressing   Problem: Activity: Goal: Risk for activity intolerance will decrease Outcome: Progressing   Problem: Coping: Goal: Level of anxiety will decrease Outcome: Progressing   Problem: Elimination: Goal: Will not experience complications related to bowel motility Outcome: Progressing Goal: Will not experience complications related to urinary retention Outcome: Progressing   Problem: Pain Managment: Goal: General experience of comfort will improve Outcome: Progressing   Problem: Safety: Goal: Ability to remain free from injury will improve Outcome: Progressing   Problem: Skin Integrity: Goal: Risk for impaired skin integrity will decrease Outcome: Progressing   Problem: Education: Goal: Ability to describe self-care measures that may prevent or decrease complications (Diabetes Survival Skills Education) will improve Outcome: Not Progressing Goal: Individualized Educational Video(s) Outcome: Not Progressing   Problem: Coping: Goal: Ability to adjust to condition or change in health will improve Outcome: Not Progressing   Problem: Fluid Volume: Goal: Ability to maintain a balanced intake and output will improve Outcome: Not Progressing   Problem: Health Behavior/Discharge Planning: Goal: Ability to identify and utilize available resources and services will improve Outcome: Not Progressing Goal: Ability to manage health-related needs will improve Outcome: Not Progressing   Problem: Metabolic: Goal: Ability to maintain appropriate glucose levels will improve Outcome: Not Progressing   Problem:  Nutritional: Goal: Maintenance of adequate nutrition will improve Outcome: Not Progressing Goal: Progress toward achieving an optimal weight will improve Outcome: Not Progressing   Problem: Education: Goal: Knowledge of General Education information will improve Description: Including pain rating scale, medication(s)/side effects and non-pharmacologic comfort measures Outcome: Not Progressing   Problem: Health Behavior/Discharge Planning: Goal: Ability to manage health-related needs will improve Outcome: Not Progressing   Problem: Clinical Measurements: Goal: Ability to maintain clinical measurements within normal limits will improve Outcome: Not Progressing Goal: Diagnostic test results will improve Outcome: Not Progressing   Problem: Nutrition: Goal: Adequate nutrition will be maintained Outcome: Not Progressing

## 2022-10-22 NOTE — TOC Progression Note (Signed)
Transition of Care [facility]) - Progression Note    Patient Details  Name: Amanda [name] MRN: [id number] Date of Birth: October 03, 1945  [facility] of [facility]) CM/SW Contact  Jancarlo Biermann [place], [place] Phone Number: 10/22/2022, 4:35 PM  Clinical Narrative:     CSW contact pt's daughter, [name], and updated pt's family that authorization was formally approved for [facility]. They are aware that bed is available at Monday.  Ambulance transport to facility at discharge.   TOC will continue to follow.    Expected Discharge Plan: Skilled Nursing Facility Barriers to Discharge: Continued Medical Work up  Expected Discharge Plan and Services     Post Acute Care Choice: Skilled Nursing Facility Living arrangements for the past 2 months: Single Family Home Expected Discharge Date: 10/19/22                                     Social Determinants of Health (SDOH) Interventions SDOH Screenings   Food Insecurity: No Food Insecurity (10/09/2022)  Housing: Low Risk  (10/18/2022)  Transportation Needs: No Transportation Needs (10/09/2022)  Utilities: Not At Risk (10/09/2022)  Depression (PHQ2-9): Low Risk  (09/14/2022)  Financial Resource Strain: Low Risk  (09/14/2022)  Physical Activity: Inactive (09/14/2022)  Stress: No Stress Concern Present (09/14/2022)  Tobacco Use: Low Risk  (10/08/2022)    Readmission Risk Interventions     No data to display

## 2022-10-22 NOTE — Progress Notes (Signed)
Physical Therapy Treatment Patient Details Name: Amanda [name] MRN: [id number] DOB: July 17, 1945 Today's Date: 10/22/2022   History of Present Illness The pt is a 77 yo female presenting 6/21 with AMS and abdominal pain. Pt found to have AKI and UTI. PMH includes: moderate-severe dementia, and dyslipidemia.    PT Comments  Pt received in bed, no family present. She required mod assist bed mobility, mod assist sit to stand, and mod assist SPT with RW. Good participation when provided increased time and encouragement. Responds well to music. Pt in recliner at end of session with feet elevated and chair alarm activated. Current POC remains appropriate, as pt would benefit from low intensity rehab in SNF upon d/c.       Assistance Recommended at Discharge Frequent or constant Supervision/Assistance  If plan is discharge home, recommend the following:  Can travel by private vehicle    Two people to help with bathing/dressing/bathroom;Assistance with feeding;Assist for transportation;Help with stairs or ramp for entrance;Two people to help with walking and/or transfers   No  Equipment Recommendations  None recommended by PT    Recommendations for Other Services       Precautions / Restrictions Precautions Precautions: Fall;Other (comment) Precaution Comments: does best with family present     Mobility  Bed Mobility Overal bed mobility: Needs Assistance Bed Mobility: Supine to Sit     Supine to sit: HOB elevated, Mod assist     General bed mobility comments: increased time and encouragement. Assist with BLE and trunk    Transfers Overall transfer level: Needs assistance Equipment used: Rolling walker (2 wheels) Transfers: Sit to/from Stand, Bed to chair/wheelchair/BSC Sit to Stand: Mod assist, From elevated surface   Step pivot transfers: Mod assist       General transfer comment: Posterior bias in stance. Shuffle steps with RW bed to recliner. Assist to maintain  balance and management RW. Multi modal cues for sequencing.    Ambulation/Gait               General Gait Details: +2 needed for gait progression   Stairs             Wheelchair Mobility     Tilt Bed    Modified Rankin (Stroke Patients Only)       Balance Overall balance assessment: Needs assistance Sitting-balance support: No upper extremity supported, Feet supported Sitting balance-Leahy Scale: Fair   Postural control: Posterior lean Standing balance support: During functional activity, Reliant on assistive device for balance, Bilateral upper extremity supported Standing balance-Leahy Scale: Poor                              Cognition Arousal/Alertness: Awake/alert Behavior During Therapy: WFL for tasks assessed/performed Overall Cognitive Status: History of cognitive impairments - at baseline                                 General Comments: Following simple commands intermittently. Responds well to music. Best with family encouragement, but no family present today.        Exercises      General Comments        Pertinent Vitals/Pain Pain Assessment Pain Assessment: Faces Faces Pain Scale: No hurt    Home Living  Prior Function            PT Goals (current goals can now be found in the care plan section) Acute Rehab PT Goals Patient Stated Goal: unable to state Progress towards PT goals: Progressing toward goals    Frequency    Min 3X/week      PT Plan Current plan remains appropriate    Co-evaluation              AM-PAC PT "6 Clicks" Mobility   Outcome Measure  Help needed turning from your back to your side while in a flat bed without using bedrails?: A Little Help needed moving from lying on your back to sitting on the side of a flat bed without using bedrails?: A Lot Help needed moving to and from a bed to a chair (including a wheelchair)?: A Lot Help  needed standing up from a chair using your arms (e.g., wheelchair or bedside chair)?: A Lot Help needed to walk in hospital room?: Total Help needed climbing 3-5 steps with a railing? : Total 6 Click Score: 11    End of Session Equipment Utilized During Treatment: Gait belt Activity Tolerance: Patient tolerated treatment well Patient left: in chair;with call bell/phone within reach;with chair alarm set Nurse Communication: Mobility status PT Visit Diagnosis: Other abnormalities of gait and mobility (R26.89);Muscle weakness (generalized) (M62.81)     Time: 1610-9604 PT Time Calculation (min) (ACUTE ONLY): 26 min  Charges:    $Therapeutic Activity: 23-37 mins PT General Charges $$ ACUTE PT VISIT: 1 Visit                     Ferd Glassing., PT  Office # [phone]    Ilda Foil 10/22/2022, 9:17 AM

## 2022-10-23 DIAGNOSIS — G9341 Metabolic encephalopathy: Secondary | ICD-10-CM

## 2022-10-23 LAB — GLUCOSE, CAPILLARY
Glucose-Capillary: 103 mg/dL — ABNORMAL HIGH (ref 70–99)
Glucose-Capillary: 91 mg/dL (ref 70–99)
Glucose-Capillary: 68 mg/dL — ABNORMAL LOW (ref 70–99)
Glucose-Capillary: 117 mg/dL — ABNORMAL HIGH (ref 70–99)
Glucose-Capillary: 100 mg/dL — ABNORMAL HIGH (ref 70–99)
Glucose-Capillary: 98 mg/dL (ref 70–99)

## 2022-10-23 MED ORDER — DEXTROSE 50 % IV SOLN
INTRAVENOUS | Status: AC
  Administered 2022-10-23: 50 mL
  Filled 2022-10-23: qty 50

## 2022-10-23 MED ORDER — GLUCOSE 4 G PO CHEW
CHEWABLE_TABLET | ORAL | Status: AC
  Filled 2022-10-23: qty 1

## 2022-10-23 NOTE — Progress Notes (Addendum)
Hypoglycemic Event  CBG: 68  Treatment: D50 25 mL (12.5 gm)  Symptoms: None  Follow-up CBG: Time: 1338 CBG Result: 117  Possible Reasons for Event: Inadequate meal intake  Comments/MD notified: MD Ogb[place] secure chatted. Daughter at bedside and does not want BG rechecked at this time. Currently feeding patient. Will try again later.    Amanda [name]

## 2022-10-23 NOTE — TOC Progression Note (Signed)
Transition of Care [facility]) - Progression Note    Patient Details  Name: Amanda [name] MRN: [id number] Date of Birth: [birth year]  Transition [facility]) CM/SW Contact  Dellie Burns [place], [place] Phone Number: 10/23/2022, 12:34 PM  Clinical Narrative:  DC order noted. Confirmed with [facility] admissions they are not able to accept pt until Monday and family still needs to complete paperwork. Weekday SW will f/u.   Dellie Burns, MSW, LCSW [phone] (coverage)      Expected Discharge Plan: Skilled Nursing Facility Barriers to Discharge: Continued Medical Work up  Expected Discharge Plan and Services     Post Acute Care Choice: Skilled Nursing Facility Living arrangements for the past 2 months: Single Family Home Expected Discharge Date: 10/19/22                                     Social Determinants of Health (SDOH) Interventions SDOH Screenings   Food Insecurity: No Food Insecurity (10/09/2022)  Housing: Low Risk  (10/18/2022)  Transportation Needs: No Transportation Needs (10/09/2022)  Utilities: Not At Risk (10/09/2022)  Depression (PHQ2-9): Low Risk  (09/14/2022)  Financial Resource Strain: Low Risk  (09/14/2022)  Physical Activity: Inactive (09/14/2022)  Stress: No Stress Concern Present (09/14/2022)  Tobacco Use: Low Risk  (10/08/2022)    Readmission Risk Interventions     No data to display

## 2022-10-23 NOTE — Progress Notes (Signed)
PROGRESS NOTE  Amanda [name]:[id number] DOB: Jul 25, 1945 DOA: 10/08/2022 PCP: [name], MD  Brief History   Patient is a 77 year old female with history of moderate to severe dementia, hyperlipidemia who initially presented with abdominal pain, fever. Workup revealed right-sided pneumonia, UTI. Urine culture grew Klebsiella pneumoniae. CXR also showed right-sided pneumonia, currently hemodynamically stable.  Plan was for discharge to SNF. Pt will need re-evaluation by PT for this to take place. The patient lives at home with her spouse Amanda [name] is an elderly man in a home with stairs. It is anticipated that the patient will not do well in this environment. Insurance is denying the patient's going to rehab at this point.   10/21/2022: Patient seen alongside patient's husband and daughter.  Patient looks much better today.  Patient is awake and alert and quite interactive.  Memory deficit persist.  Awaiting possible SNF placement.  10/22/2022: Patient seen.  No new changes.  Authorization has been approved.  Likely discharge on Monday, 10/25/2022. 10/23/2022: Patient seen.  Patient has remained stable.  Awaiting disposition.  Blood sugar of 68 reported.  Caloric count.  Dietary consult.  Interval History/Subjective  -Patient seen. -No new changes. -Patient is unable to give coherent history. Objective   Vitals:  Vitals:   10/23/22 0455 10/23/22 0722  BP: (!) 124/54 116/63  Pulse: 85 76  Resp: 17 17  Temp: 98.5 F (36.9 C) 98.3 F (36.8 C)  SpO2: 98% 100%     Scheduled Meds:  enoxaparin (LOVENOX) injection  40 mg Subcutaneous Q24H   feeding supplement  237 mL Oral BID BM   polyethylene glycol  17 g Oral BID   sodium chloride flush  3 mL Intravenous Q12H   Continuous Infusions:  Principal Problem:   UTI (urinary tract infection) Active Problems:   AMS (altered mental status)   Urinary tract infection   AKI (acute kidney injury) (HCC)   Altered mental status  A & P   Altered mental status/history of dementia: Patient follows with neurology for dementia as an outpatient.  Lives with her husband.  At baseline, confused to time and location.  Altered mental status was thought to be secondary to UTI, AKI on presentation .CT head did not show any acute intracranial abnormalities.  MRI was attempted but she could not tolerate despite premedication.  No focal neurological deficits.  Continue delirium precautions.Mentation at baseline now 10/21/2022: Resolved significantly.   AKI: Resolved   Community-acquired pneumonia: Chest x-ray showed right middle and right lower lobe infiltrate. She is currently on room air.  Respiratory status stable.No cough or SOB.  Likely this is aspiration pneumonia.  Speech therapy consulted and now has recommended regular diet.  Competed antibiotic course .   UTI: Urine culture shows Klebsiella.  Finished abx  Diarrhea: The patient's daughter has related that the patient is havign loose stools. These have not been recorded or mentioned by nursing. The patient and her daughter state that this has happened 3 times today. Will check stool culture and consider loperamide should the patient's stooling rises to that level.   Deconditioning/debility: Patient seen by PT/OT. She will need rehab at dc on discharge. The patient lives at home with her spouse. Although she has previously been fairly independent, she is currently requiring 2 person assist with standing, transfers, and taking stairs. There are stairs in her home. She currently has insufficient assistance at home to be safe. PT has been asked to re-evaluate the patient for rehab placement.   Nutrition  Problem: Increased nutrient needs Etiology: acute illness  Hypoglycemia: -Blood sugar of 68. -Caloric count. -Dietary consult. -Monitor closely.   DVT prophylaxis:enoxaparin (LOVENOX) injection 40 mg Start: 10/09/22 0800 SCDs Start: 10/08/22 2355       Code Status: Full Code    Family Communication:   Patient status:Inpatient   Patient is from :Home   Anticipated discharge to:SNF    Estimated DC date:whenever possible     Consultants: None   Procedures:None   Antimicrobials:      Berton Mount MD. [facility] Direct contact: see [email or web address]  7PM-7AM contact night coverage as above 10/20/2022, 6:30 PM  LOS: 12 days     LOS: 14 days

## 2022-10-23 NOTE — Plan of Care (Signed)
Patient alert but disoriented x 4, grabbled speech, unable to follow commands.  VSS throughout shift.  Diminished lungs, IS encouraged.  All meds given on time as ordered.  Purewick in place, bath given. POC maintained, will continue to maintain.   Problem: Education: Goal: Ability to describe self-care measures that may prevent or decrease complications (Diabetes Survival Skills Education) will improve Outcome: Progressing Goal: Individualized Educational Video(s) Outcome: Progressing   Problem: Coping: Goal: Ability to adjust to condition or change in health will improve Outcome: Progressing   Problem: Fluid Volume: Goal: Ability to maintain a balanced intake and output will improve Outcome: Progressing   Problem: Health Behavior/Discharge Planning: Goal: Ability to identify and utilize available resources and services will improve Outcome: Progressing Goal: Ability to manage health-related needs will improve Outcome: Progressing   Problem: Metabolic: Goal: Ability to maintain appropriate glucose levels will improve Outcome: Progressing   Problem: Nutritional: Goal: Maintenance of adequate nutrition will improve Outcome: Progressing Goal: Progress toward achieving an optimal weight will improve Outcome: Progressing   Problem: Skin Integrity: Goal: Risk for impaired skin integrity will decrease Outcome: Progressing   Problem: Tissue Perfusion: Goal: Adequacy of tissue perfusion will improve Outcome: Progressing   Problem: Education: Goal: Knowledge of General Education information will improve Description: Including pain rating scale, medication(s)/side effects and non-pharmacologic comfort measures Outcome: Progressing   Problem: Health Behavior/Discharge Planning: Goal: Ability to manage health-related needs will improve Outcome: Progressing   Problem: Clinical Measurements: Goal: Ability to maintain clinical measurements within normal limits will  improve Outcome: Progressing Goal: Will remain free from infection Outcome: Progressing Goal: Diagnostic test results will improve Outcome: Progressing Goal: Respiratory complications will improve Outcome: Progressing Goal: Cardiovascular complication will be avoided Outcome: Progressing   Problem: Activity: Goal: Risk for activity intolerance will decrease Outcome: Progressing   Problem: Nutrition: Goal: Adequate nutrition will be maintained Outcome: Progressing   Problem: Coping: Goal: Level of anxiety will decrease Outcome: Progressing   Problem: Elimination: Goal: Will not experience complications related to bowel motility Outcome: Progressing Goal: Will not experience complications related to urinary retention Outcome: Progressing   Problem: Pain Managment: Goal: General experience of comfort will improve Outcome: Progressing   Problem: Safety: Goal: Ability to remain free from injury will improve Outcome: Progressing   Problem: Skin Integrity: Goal: Risk for impaired skin integrity will decrease Outcome: Progressing

## 2022-10-24 DIAGNOSIS — G9341 Metabolic encephalopathy: Secondary | ICD-10-CM | POA: Diagnosis not present

## 2022-10-24 LAB — GLUCOSE, CAPILLARY
Glucose-Capillary: 91 mg/dL (ref 70–99)
Glucose-Capillary: 112 mg/dL — ABNORMAL HIGH (ref 70–99)
Glucose-Capillary: 107 mg/dL — ABNORMAL HIGH (ref 70–99)
Glucose-Capillary: 79 mg/dL (ref 70–99)

## 2022-10-24 NOTE — Progress Notes (Signed)
PROGRESS NOTE  Amanda [name] VHQ:[id number] DOB: February 25, 1946 DOA: 10/08/2022 PCP: [name], MD  Brief History   Patient is a 77 year old female with history of moderate to severe dementia, hyperlipidemia who initially presented with abdominal pain, fever. Workup revealed right-sided pneumonia, UTI. Urine culture grew Klebsiella pneumoniae. CXR also showed right-sided pneumonia, currently hemodynamically stable.  Plan was for discharge to SNF. Pt will need re-evaluation by PT for this to take place. The patient lives at home with her spouse Hervey Ard is an elderly man in a home with stairs. It is anticipated that the patient will not do well in this environment. Insurance is denying the patient's going to rehab at this point.   10/24/2022: Patient seen.  Patient is currently being fed.  Otherwise, patient looks normal.  No complaints.  No significant history from patient.  Pursue disposition.  Interval History/Subjective  -Patient seen. -No new changes. -Patient is unable to give coherent history. Objective   Vitals:  Vitals:   10/24/22 0533 10/24/22 0718  BP: 123/64 123/63  Pulse: 81 77  Resp: 18 18  Temp: 98.7 F (37.1 C) 98.5 F (36.9 C)  SpO2: 99% 98%     Scheduled Meds:  enoxaparin (LOVENOX) injection  40 mg Subcutaneous Q24H   feeding supplement  237 mL Oral BID BM   polyethylene glycol  17 g Oral BID   sodium chloride flush  3 mL Intravenous Q12H   Continuous Infusions:  Principal Problem:   UTI (urinary tract infection) Active Problems:   AMS (altered mental status)   Urinary tract infection   AKI (acute kidney injury) (HCC)   Altered mental status  A & P  Altered mental status/history of dementia: Patient follows with neurology for dementia as an outpatient.  Lives with her husband.  At baseline, confused to time and location.  Altered mental status was thought to be secondary to UTI, AKI on presentation .CT head did not show any acute intracranial  abnormalities.  MRI was attempted but she could not tolerate despite premedication.  No focal neurological deficits.  Continue delirium precautions.Mentation at baseline now 10/21/2022: Resolved significantly.   AKI: Resolved   Community-acquired pneumonia: Chest x-ray showed right middle and right lower lobe infiltrate. She is currently on room air.  Respiratory status stable.No cough or SOB.  Likely this is aspiration pneumonia.  Speech therapy consulted and now has recommended regular diet.  Competed antibiotic course .   UTI: Urine culture shows Klebsiella.  Finished abx  Diarrhea: The patient's daughter has related that the patient is havign loose stools. These have not been recorded or mentioned by nursing. The patient and her daughter state that this has happened 3 times today. Will check stool culture and consider loperamide should the patient's stooling rises to that level.   Deconditioning/debility: Patient seen by PT/OT. She will need rehab at dc on discharge. The patient lives at home with her spouse. Although she has previously been fairly independent, she is currently requiring 2 person assist with standing, transfers, and taking stairs. There are stairs in her home. She currently has insufficient assistance at home to be safe. PT has been asked to re-evaluate the patient for rehab placement.   Nutrition Problem: Increased nutrient needs Etiology: acute illness  Hypoglycemia: -Blood sugar of 68. -Caloric count. -Dietary consult. -Monitor closely.   DVT prophylaxis:enoxaparin (LOVENOX) injection 40 mg Start: 10/09/22 0800 SCDs Start: 10/08/22 2355       Code Status: Full Code   Family Communication:  Patient status:Inpatient   Patient is from :Home   Anticipated discharge to:SNF    Estimated DC date:whenever possible     Consultants: None   Procedures:None   Antimicrobials:      Berton Mount MD. [facility] Direct contact: see [email or web address]   7PM-7AM contact night coverage as above 10/20/2022, 6:30 PM  LOS: 12 days     LOS: 15 days

## 2022-10-24 NOTE — Plan of Care (Signed)
Patient alert and oriented to self only, grabbled speech, unable to follow commands. VSS throughout shift. Diminished lungs, IS encouraged. All meds given on time as ordered. Pt had BM last night.  Purewick in place, bath given. POC maintained, will continue to maintain.   Problem: Education: Goal: Ability to describe self-care measures that may prevent or decrease complications (Diabetes Survival Skills Education) will improve Outcome: Progressing Goal: Individualized Educational Video(s) Outcome: Progressing   Problem: Coping: Goal: Ability to adjust to condition or change in health will improve Outcome: Progressing   Problem: Fluid Volume: Goal: Ability to maintain a balanced intake and output will improve Outcome: Progressing   Problem: Health Behavior/Discharge Planning: Goal: Ability to identify and utilize available resources and services will improve Outcome: Progressing Goal: Ability to manage health-related needs will improve Outcome: Progressing   Problem: Metabolic: Goal: Ability to maintain appropriate glucose levels will improve Outcome: Progressing   Problem: Nutritional: Goal: Maintenance of adequate nutrition will improve Outcome: Progressing Goal: Progress toward achieving an optimal weight will improve Outcome: Progressing   Problem: Skin Integrity: Goal: Risk for impaired skin integrity will decrease Outcome: Progressing   Problem: Tissue Perfusion: Goal: Adequacy of tissue perfusion will improve Outcome: Progressing   Problem: Education: Goal: Knowledge of General Education information will improve Description: Including pain rating scale, medication(s)/side effects and non-pharmacologic comfort measures Outcome: Progressing   Problem: Health Behavior/Discharge Planning: Goal: Ability to manage health-related needs will improve Outcome: Progressing   Problem: Clinical Measurements: Goal: Ability to maintain clinical measurements within normal  limits will improve Outcome: Progressing Goal: Will remain free from infection Outcome: Progressing Goal: Diagnostic test results will improve Outcome: Progressing Goal: Respiratory complications will improve Outcome: Progressing Goal: Cardiovascular complication will be avoided Outcome: Progressing   Problem: Activity: Goal: Risk for activity intolerance will decrease Outcome: Progressing   Problem: Nutrition: Goal: Adequate nutrition will be maintained Outcome: Progressing   Problem: Coping: Goal: Level of anxiety will decrease Outcome: Progressing   Problem: Elimination: Goal: Will not experience complications related to bowel motility Outcome: Progressing Goal: Will not experience complications related to urinary retention Outcome: Progressing   Problem: Pain Managment: Goal: General experience of comfort will improve Outcome: Progressing   Problem: Safety: Goal: Ability to remain free from injury will improve Outcome: Progressing   Problem: Skin Integrity: Goal: Risk for impaired skin integrity will decrease Outcome: Progressing

## 2022-10-25 DIAGNOSIS — R638 Other symptoms and signs concerning food and fluid intake: Secondary | ICD-10-CM | POA: Diagnosis not present

## 2022-10-25 DIAGNOSIS — Z7401 Bed confinement status: Secondary | ICD-10-CM | POA: Diagnosis not present

## 2022-10-25 DIAGNOSIS — R456 Violent behavior: Secondary | ICD-10-CM | POA: Diagnosis not present

## 2022-10-25 DIAGNOSIS — N390 Urinary tract infection, site not specified: Secondary | ICD-10-CM | POA: Diagnosis not present

## 2022-10-25 DIAGNOSIS — I959 Hypotension, unspecified: Secondary | ICD-10-CM | POA: Diagnosis not present

## 2022-10-25 DIAGNOSIS — U071 COVID-19: Secondary | ICD-10-CM | POA: Diagnosis not present

## 2022-10-25 DIAGNOSIS — R279 Unspecified lack of coordination: Secondary | ICD-10-CM | POA: Diagnosis not present

## 2022-10-25 DIAGNOSIS — R5381 Other malaise: Secondary | ICD-10-CM | POA: Diagnosis not present

## 2022-10-25 DIAGNOSIS — K5900 Constipation, unspecified: Secondary | ICD-10-CM | POA: Diagnosis not present

## 2022-10-25 DIAGNOSIS — R2681 Unsteadiness on feet: Secondary | ICD-10-CM | POA: Diagnosis not present

## 2022-10-25 DIAGNOSIS — N3001 Acute cystitis with hematuria: Secondary | ICD-10-CM | POA: Diagnosis not present

## 2022-10-25 DIAGNOSIS — R627 Adult failure to thrive: Secondary | ICD-10-CM | POA: Diagnosis not present

## 2022-10-25 DIAGNOSIS — B961 Klebsiella pneumoniae [K. pneumoniae] as the cause of diseases classified elsewhere: Secondary | ICD-10-CM | POA: Diagnosis not present

## 2022-10-25 DIAGNOSIS — R404 Transient alteration of awareness: Secondary | ICD-10-CM | POA: Diagnosis not present

## 2022-10-25 DIAGNOSIS — R195 Other fecal abnormalities: Secondary | ICD-10-CM | POA: Diagnosis not present

## 2022-10-25 DIAGNOSIS — J9601 Acute respiratory failure with hypoxia: Secondary | ICD-10-CM | POA: Diagnosis not present

## 2022-10-25 DIAGNOSIS — F0390 Unspecified dementia without behavioral disturbance: Secondary | ICD-10-CM | POA: Diagnosis not present

## 2022-10-25 DIAGNOSIS — R4182 Altered mental status, unspecified: Secondary | ICD-10-CM | POA: Diagnosis not present

## 2022-10-25 DIAGNOSIS — E785 Hyperlipidemia, unspecified: Secondary | ICD-10-CM | POA: Diagnosis not present

## 2022-10-25 DIAGNOSIS — J189 Pneumonia, unspecified organism: Secondary | ICD-10-CM | POA: Diagnosis not present

## 2022-10-25 DIAGNOSIS — G9341 Metabolic encephalopathy: Secondary | ICD-10-CM | POA: Diagnosis not present

## 2022-10-25 DIAGNOSIS — F4320 Adjustment disorder, unspecified: Secondary | ICD-10-CM | POA: Diagnosis not present

## 2022-10-25 DIAGNOSIS — J150 Pneumonia due to Klebsiella pneumoniae: Secondary | ICD-10-CM | POA: Diagnosis not present

## 2022-10-25 LAB — GLUCOSE, CAPILLARY: Glucose-Capillary: 103 mg/dL — ABNORMAL HIGH (ref 70–99)

## 2022-10-25 NOTE — Progress Notes (Signed)
Attempted to call report to receiving facility.  Phone went to a voicemail recorder.

## 2022-10-25 NOTE — TOC Transition Note (Signed)
Transition of Care [facility]) - CM/SW Discharge Note   Patient Details  Name: Amanda [name] MRN: [id number] Date of Birth: 07-23-1945  Transition of Care [facility] CM/SW Contact:  Naasir Carreira A [place], Theresia Majors Phone Number: 10/25/2022, 4:01 PM   Clinical Narrative:     [facility]   Anticipated DC date: 10/25/22   Family notified: Norm Parcel  Transport by: Sharin Mons      Per MD patient ready for DC to [facility] . RN, patient, patient's family, and facility notified of DC. Discharge Summary and FL2 sent to facility. RN to call report prior to discharge [phone]). DC packet on chart. Ambulance transport requested for patient.     CSW will sign off for now as social work intervention is no longer needed. Please consult [name] again if new needs arise.   Final next level of care: Skilled Nursing Facility Barriers to Discharge: Barriers Resolved   Patient Goals and CMS Choice      Discharge Placement                [facility] Patient to be transferred to facility by: [facility] Name of family member notified: [name] Patient and family notified of of transfer: 10/25/22  Discharge Plan and Services Additional resources added to the After Visit Summary for       Post Acute Care Choice: Skilled Nursing Facility                               Social Determinants of Health (SDOH) Interventions SDOH Screenings   Food Insecurity: No Food Insecurity (10/09/2022)  Housing: Low Risk  (10/18/2022)  Transportation Needs: No Transportation Needs (10/09/2022)  Utilities: Not At Risk (10/09/2022)  Depression (PHQ2-9): Low Risk  (09/14/2022)  Financial Resource Strain: Low Risk  (09/14/2022)  Physical Activity: Inactive (09/14/2022)  Stress: No Stress Concern Present (09/14/2022)  Tobacco Use: Low Risk  (10/08/2022)     Readmission Risk Interventions     No data to display

## 2022-10-25 NOTE — Progress Notes (Signed)
Second attempt made to call report.

## 2022-10-25 NOTE — Progress Notes (Signed)
Nutrition Follow-up  DOCUMENTATION CODES:   Not applicable  INTERVENTION:  - Continue Regular diet.   NUTRITION DIAGNOSIS:   Increased nutrient needs related to acute illness as evidenced by estimated needs.  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 Admitted with  c/o abdominal pain, fever d/t R sided PNA and UTI.  Meds reviewed: miralax. Labs reviewed (7/1): BUN low.   Pt is planning to discharge to SNF today. RD spoke with pt's daughter at bedside. She reports that the pt is a very picky eater but has been doing really well with foods being brought in from home. Calorie count was ordered on 7/6. There were 3 pieces of paper in the folder but all were documented meals from home. RD unable to gather sufficient data to determine exact amount of calories since meals were from home but daughter reports that the pt has been eating well with encouragement and family members feeding her. RD will continue to monitor if pt does not discharge today.   Diet Order:   Diet Order             Diet - low sodium heart healthy           Diet general           Diet regular Room service appropriate? No; Fluid consistency: Thin  Diet effective now                   EDUCATION NEEDS:   No education needs have been identified at this time  Skin:  Skin Assessment: Reviewed RN Assessment  Last BM:  7/7  Height:   Ht Readings from Last 1 Encounters:  10/08/22 5\' 5"  (1.651 m)    Weight:   Wt Readings from Last 1 Encounters:  10/24/22 59.4 kg    Ideal Body Weight:     BMI:  Body mass index is 21.8 kg/m.  Estimated Nutritional Needs:   Kcal:  1600-1800  Protein:  80-95g  Fluid:  >/=1.6L  Markiyah Gahm [place], RD, LDN, CNSC.

## 2022-10-25 NOTE — Progress Notes (Signed)
Third attempt made to call report with call going to a voicemail recorder.

## 2022-10-27 DIAGNOSIS — F4320 Adjustment disorder, unspecified: Secondary | ICD-10-CM | POA: Diagnosis not present

## 2022-10-27 DIAGNOSIS — F0390 Unspecified dementia without behavioral disturbance: Secondary | ICD-10-CM | POA: Diagnosis not present

## 2022-10-27 DIAGNOSIS — E785 Hyperlipidemia, unspecified: Secondary | ICD-10-CM | POA: Diagnosis not present

## 2022-10-28 DIAGNOSIS — J150 Pneumonia due to Klebsiella pneumoniae: Secondary | ICD-10-CM | POA: Diagnosis not present

## 2022-10-28 DIAGNOSIS — F0390 Unspecified dementia without behavioral disturbance: Secondary | ICD-10-CM | POA: Diagnosis not present

## 2022-10-28 DIAGNOSIS — J189 Pneumonia, unspecified organism: Secondary | ICD-10-CM | POA: Diagnosis not present

## 2022-10-28 DIAGNOSIS — N390 Urinary tract infection, site not specified: Secondary | ICD-10-CM | POA: Diagnosis not present

## 2022-11-23 DIAGNOSIS — J189 Pneumonia, unspecified organism: Secondary | ICD-10-CM | POA: Diagnosis not present

## 2022-11-30 DIAGNOSIS — E785 Hyperlipidemia, unspecified: Secondary | ICD-10-CM | POA: Diagnosis not present

## 2022-11-30 DIAGNOSIS — R5381 Other malaise: Secondary | ICD-10-CM | POA: Diagnosis not present

## 2022-11-30 DIAGNOSIS — K5900 Constipation, unspecified: Secondary | ICD-10-CM | POA: Diagnosis not present

## 2022-11-30 DIAGNOSIS — F0390 Unspecified dementia without behavioral disturbance: Secondary | ICD-10-CM | POA: Diagnosis not present

## 2022-12-01 ENCOUNTER — Ambulatory Visit: Payer: PPO | Admitting: Podiatry

## 2022-12-03 DIAGNOSIS — F0390 Unspecified dementia without behavioral disturbance: Secondary | ICD-10-CM | POA: Diagnosis not present

## 2022-12-03 DIAGNOSIS — R627 Adult failure to thrive: Secondary | ICD-10-CM | POA: Diagnosis not present

## 2022-12-03 DIAGNOSIS — R638 Other symptoms and signs concerning food and fluid intake: Secondary | ICD-10-CM | POA: Diagnosis not present

## 2022-12-03 DIAGNOSIS — R5381 Other malaise: Secondary | ICD-10-CM | POA: Diagnosis not present

## 2022-12-13 DIAGNOSIS — J189 Pneumonia, unspecified organism: Secondary | ICD-10-CM | POA: Diagnosis not present

## 2022-12-14 DIAGNOSIS — J189 Pneumonia, unspecified organism: Secondary | ICD-10-CM | POA: Diagnosis not present

## 2022-12-17 DIAGNOSIS — R195 Other fecal abnormalities: Secondary | ICD-10-CM | POA: Diagnosis not present

## 2022-12-17 DIAGNOSIS — E785 Hyperlipidemia, unspecified: Secondary | ICD-10-CM | POA: Diagnosis not present

## 2022-12-17 DIAGNOSIS — F0390 Unspecified dementia without behavioral disturbance: Secondary | ICD-10-CM | POA: Diagnosis not present

## 2022-12-17 DIAGNOSIS — R5381 Other malaise: Secondary | ICD-10-CM | POA: Diagnosis not present

## 2022-12-17 DIAGNOSIS — R627 Adult failure to thrive: Secondary | ICD-10-CM | POA: Diagnosis not present

## 2022-12-22 DIAGNOSIS — N3001 Acute cystitis with hematuria: Secondary | ICD-10-CM | POA: Diagnosis not present

## 2022-12-22 DIAGNOSIS — J189 Pneumonia, unspecified organism: Secondary | ICD-10-CM | POA: Diagnosis not present

## 2022-12-22 DIAGNOSIS — R279 Unspecified lack of coordination: Secondary | ICD-10-CM | POA: Diagnosis not present

## 2022-12-22 DIAGNOSIS — E785 Hyperlipidemia, unspecified: Secondary | ICD-10-CM | POA: Diagnosis not present

## 2022-12-22 DIAGNOSIS — R627 Adult failure to thrive: Secondary | ICD-10-CM | POA: Diagnosis not present

## 2022-12-22 DIAGNOSIS — F0390 Unspecified dementia without behavioral disturbance: Secondary | ICD-10-CM | POA: Diagnosis not present

## 2022-12-22 DIAGNOSIS — R2681 Unsteadiness on feet: Secondary | ICD-10-CM | POA: Diagnosis not present

## 2022-12-22 DIAGNOSIS — R5381 Other malaise: Secondary | ICD-10-CM | POA: Diagnosis not present

## 2022-12-29 ENCOUNTER — Ambulatory Visit: Payer: PPO | Admitting: Podiatry

## 2022-12-30 ENCOUNTER — Telehealth: Payer: Self-pay | Admitting: *Deleted

## 2022-12-30 NOTE — Telephone Encounter (Signed)
Marcelino Duster with [facility] [phone] called to let you know patient was referred to them for Home Health therapy services. She said these are not appropriate at this time and she is not alert enough, her dementia is progressing too much for this service.

## 2023-01-04 ENCOUNTER — Inpatient Hospital Stay: Payer: PPO | Admitting: Family Medicine

## 2023-01-04 DIAGNOSIS — F01B3 Vascular dementia, moderate, with mood disturbance: Secondary | ICD-10-CM

## 2023-01-18 ENCOUNTER — Emergency Department (HOSPITAL_COMMUNITY): Payer: PPO

## 2023-01-18 ENCOUNTER — Encounter (HOSPITAL_COMMUNITY): Payer: Self-pay

## 2023-01-18 ENCOUNTER — Inpatient Hospital Stay (HOSPITAL_COMMUNITY)
Admission: EM | Admit: 2023-01-18 | Discharge: 2023-03-02 | DRG: 884 | Disposition: A | Discharge status: Hospice | Payer: PPO | Attending: Internal Medicine | Admitting: Internal Medicine

## 2023-01-18 ENCOUNTER — Ambulatory Visit (INDEPENDENT_AMBULATORY_CARE_PROVIDER_SITE_OTHER): Payer: PPO | Admitting: Family Medicine

## 2023-01-18 ENCOUNTER — Inpatient Hospital Stay (HOSPITAL_COMMUNITY): Payer: PPO

## 2023-01-18 ENCOUNTER — Other Ambulatory Visit: Payer: Self-pay

## 2023-01-18 VITALS — BP 110/72 | HR 112 | Resp 48

## 2023-01-18 DIAGNOSIS — Z515 Encounter for palliative care: Secondary | ICD-10-CM | POA: Diagnosis not present

## 2023-01-18 DIAGNOSIS — Z4682 Encounter for fitting and adjustment of non-vascular catheter: Secondary | ICD-10-CM | POA: Diagnosis not present

## 2023-01-18 DIAGNOSIS — L89892 Pressure ulcer of other site, stage 2: Secondary | ICD-10-CM | POA: Diagnosis not present

## 2023-01-18 DIAGNOSIS — F03B0 Unspecified dementia, moderate, without behavioral disturbance, psychotic disturbance, mood disturbance, and anxiety: Principal | ICD-10-CM | POA: Diagnosis present

## 2023-01-18 DIAGNOSIS — E860 Dehydration: Secondary | ICD-10-CM | POA: Diagnosis not present

## 2023-01-18 DIAGNOSIS — R64 Cachexia: Secondary | ICD-10-CM | POA: Diagnosis present

## 2023-01-18 DIAGNOSIS — E785 Hyperlipidemia, unspecified: Secondary | ICD-10-CM | POA: Diagnosis present

## 2023-01-18 DIAGNOSIS — R748 Abnormal levels of other serum enzymes: Secondary | ICD-10-CM | POA: Diagnosis not present

## 2023-01-18 DIAGNOSIS — I6782 Cerebral ischemia: Secondary | ICD-10-CM | POA: Diagnosis not present

## 2023-01-18 DIAGNOSIS — E870 Hyperosmolality and hypernatremia: Secondary | ICD-10-CM | POA: Diagnosis present

## 2023-01-18 DIAGNOSIS — R1310 Dysphagia, unspecified: Secondary | ICD-10-CM | POA: Diagnosis present

## 2023-01-18 DIAGNOSIS — Z1152 Encounter for screening for COVID-19: Secondary | ICD-10-CM

## 2023-01-18 DIAGNOSIS — N170 Acute kidney failure with tubular necrosis: Secondary | ICD-10-CM | POA: Diagnosis not present

## 2023-01-18 DIAGNOSIS — I10 Essential (primary) hypertension: Secondary | ICD-10-CM | POA: Diagnosis not present

## 2023-01-18 DIAGNOSIS — Z66 Do not resuscitate: Secondary | ICD-10-CM | POA: Diagnosis not present

## 2023-01-18 DIAGNOSIS — R6510 Systemic inflammatory response syndrome (SIRS) of non-infectious origin without acute organ dysfunction: Secondary | ICD-10-CM | POA: Diagnosis present

## 2023-01-18 DIAGNOSIS — N179 Acute kidney failure, unspecified: Secondary | ICD-10-CM | POA: Diagnosis present

## 2023-01-18 DIAGNOSIS — K449 Diaphragmatic hernia without obstruction or gangrene: Secondary | ICD-10-CM | POA: Diagnosis not present

## 2023-01-18 DIAGNOSIS — E43 Unspecified severe protein-calorie malnutrition: Secondary | ICD-10-CM | POA: Diagnosis not present

## 2023-01-18 DIAGNOSIS — E878 Other disorders of electrolyte and fluid balance, not elsewhere classified: Secondary | ICD-10-CM | POA: Diagnosis not present

## 2023-01-18 DIAGNOSIS — Z7401 Bed confinement status: Secondary | ICD-10-CM | POA: Diagnosis not present

## 2023-01-18 DIAGNOSIS — E8809 Other disorders of plasma-protein metabolism, not elsewhere classified: Secondary | ICD-10-CM | POA: Diagnosis not present

## 2023-01-18 DIAGNOSIS — Z7901 Long term (current) use of anticoagulants: Secondary | ICD-10-CM

## 2023-01-18 DIAGNOSIS — K8020 Calculus of gallbladder without cholecystitis without obstruction: Secondary | ICD-10-CM | POA: Diagnosis not present

## 2023-01-18 DIAGNOSIS — I82442 Acute embolism and thrombosis of left tibial vein: Secondary | ICD-10-CM | POA: Diagnosis present

## 2023-01-18 DIAGNOSIS — R627 Adult failure to thrive: Secondary | ICD-10-CM | POA: Diagnosis present

## 2023-01-18 DIAGNOSIS — E876 Hypokalemia: Secondary | ICD-10-CM | POA: Diagnosis not present

## 2023-01-18 DIAGNOSIS — G9341 Metabolic encephalopathy: Principal | ICD-10-CM | POA: Diagnosis present

## 2023-01-18 DIAGNOSIS — R6520 Severe sepsis without septic shock: Secondary | ICD-10-CM | POA: Diagnosis not present

## 2023-01-18 DIAGNOSIS — Z8701 Personal history of pneumonia (recurrent): Secondary | ICD-10-CM | POA: Diagnosis not present

## 2023-01-18 DIAGNOSIS — Z87440 Personal history of urinary (tract) infections: Secondary | ICD-10-CM

## 2023-01-18 DIAGNOSIS — Z8249 Family history of ischemic heart disease and other diseases of the circulatory system: Secondary | ICD-10-CM

## 2023-01-18 DIAGNOSIS — A419 Sepsis, unspecified organism: Secondary | ICD-10-CM | POA: Diagnosis not present

## 2023-01-18 DIAGNOSIS — E162 Hypoglycemia, unspecified: Secondary | ICD-10-CM | POA: Diagnosis not present

## 2023-01-18 DIAGNOSIS — L89152 Pressure ulcer of sacral region, stage 2: Secondary | ICD-10-CM

## 2023-01-18 DIAGNOSIS — I700 Atherosclerosis of aorta: Secondary | ICD-10-CM | POA: Diagnosis not present

## 2023-01-18 DIAGNOSIS — Z0389 Encounter for observation for other suspected diseases and conditions ruled out: Secondary | ICD-10-CM | POA: Diagnosis not present

## 2023-01-18 DIAGNOSIS — L89150 Pressure ulcer of sacral region, unstageable: Secondary | ICD-10-CM | POA: Diagnosis not present

## 2023-01-18 DIAGNOSIS — L89616 Pressure-induced deep tissue damage of right heel: Secondary | ICD-10-CM | POA: Diagnosis not present

## 2023-01-18 DIAGNOSIS — Z7189 Other specified counseling: Secondary | ICD-10-CM | POA: Diagnosis not present

## 2023-01-18 DIAGNOSIS — R6339 Other feeding difficulties: Secondary | ICD-10-CM | POA: Diagnosis not present

## 2023-01-18 DIAGNOSIS — Z993 Dependence on wheelchair: Secondary | ICD-10-CM

## 2023-01-18 DIAGNOSIS — R000 Tachycardia, unspecified: Secondary | ICD-10-CM | POA: Diagnosis not present

## 2023-01-18 DIAGNOSIS — Z681 Body mass index (BMI) 19 or less, adult: Secondary | ICD-10-CM

## 2023-01-18 DIAGNOSIS — S0990XA Unspecified injury of head, initial encounter: Secondary | ICD-10-CM | POA: Diagnosis not present

## 2023-01-18 DIAGNOSIS — R4182 Altered mental status, unspecified: Secondary | ICD-10-CM | POA: Diagnosis not present

## 2023-01-18 DIAGNOSIS — G319 Degenerative disease of nervous system, unspecified: Secondary | ICD-10-CM | POA: Diagnosis not present

## 2023-01-18 DIAGNOSIS — I82412 Acute embolism and thrombosis of left femoral vein: Secondary | ICD-10-CM | POA: Diagnosis not present

## 2023-01-18 DIAGNOSIS — L8990 Pressure ulcer of unspecified site, unspecified stage: Secondary | ICD-10-CM | POA: Diagnosis present

## 2023-01-18 DIAGNOSIS — M7989 Other specified soft tissue disorders: Secondary | ICD-10-CM | POA: Diagnosis not present

## 2023-01-18 DIAGNOSIS — R569 Unspecified convulsions: Secondary | ICD-10-CM | POA: Diagnosis not present

## 2023-01-18 DIAGNOSIS — R401 Stupor: Secondary | ICD-10-CM

## 2023-01-18 DIAGNOSIS — I2510 Atherosclerotic heart disease of native coronary artery without angina pectoris: Secondary | ICD-10-CM | POA: Diagnosis not present

## 2023-01-18 DIAGNOSIS — R197 Diarrhea, unspecified: Secondary | ICD-10-CM | POA: Diagnosis not present

## 2023-01-18 DIAGNOSIS — I82402 Acute embolism and thrombosis of unspecified deep veins of left lower extremity: Secondary | ICD-10-CM

## 2023-01-18 DIAGNOSIS — K5730 Diverticulosis of large intestine without perforation or abscess without bleeding: Secondary | ICD-10-CM | POA: Diagnosis not present

## 2023-01-18 DIAGNOSIS — Z79899 Other long term (current) drug therapy: Secondary | ICD-10-CM

## 2023-01-18 LAB — CBC WITH DIFFERENTIAL/PLATELET
Abs Immature Granulocytes: 0.25 10*3/uL — ABNORMAL HIGH (ref 0.00–0.07)
Basophils Absolute: 0.1 10*3/uL (ref 0.0–0.1)
Basophils Relative: 0 %
Eosinophils Absolute: 0 10*3/uL (ref 0.0–0.5)
Eosinophils Relative: 0 %
HCT: 44.2 % (ref 36.0–46.0)
Hemoglobin: 13.2 g/dL (ref 12.0–15.0)
Immature Granulocytes: 1 %
Lymphocytes Relative: 5 %
Lymphs Abs: 0.9 10*3/uL (ref 0.7–4.0)
MCH: 25.8 pg — ABNORMAL LOW (ref 26.0–34.0)
MCHC: 29.9 g/dL — ABNORMAL LOW (ref 30.0–36.0)
MCV: 86.3 fL (ref 80.0–100.0)
Monocytes Absolute: 1.3 10*3/uL — ABNORMAL HIGH (ref 0.1–1.0)
Monocytes Relative: 7 %
Neutro Abs: 15.3 10*3/uL — ABNORMAL HIGH (ref 1.7–7.7)
Neutrophils Relative %: 87 %
Platelets: 209 10*3/uL (ref 150–400)
RBC: 5.12 MIL/uL — ABNORMAL HIGH (ref 3.87–5.11)
RDW: 15.8 % — ABNORMAL HIGH (ref 11.5–15.5)
WBC: 17.8 10*3/uL — ABNORMAL HIGH (ref 4.0–10.5)
nRBC: 0 % (ref 0.0–0.2)

## 2023-01-18 LAB — RAPID URINE DRUG SCREEN, HOSP PERFORMED
Amphetamines: NOT DETECTED
Barbiturates: NOT DETECTED
Benzodiazepines: NOT DETECTED
Cocaine: NOT DETECTED
Opiates: NOT DETECTED
Tetrahydrocannabinol: NOT DETECTED

## 2023-01-18 LAB — COMPREHENSIVE METABOLIC PANEL
ALT: 30 U/L (ref 0–44)
AST: 48 U/L — ABNORMAL HIGH (ref 15–41)
Albumin: 3.3 g/dL — ABNORMAL LOW (ref 3.5–5.0)
Alkaline Phosphatase: 64 U/L (ref 38–126)
Anion gap: 17 — ABNORMAL HIGH (ref 5–15)
BUN: 138 mg/dL — ABNORMAL HIGH (ref 8–23)
CO2: 25 mmol/L (ref 22–32)
Calcium: 9.6 mg/dL (ref 8.9–10.3)
Chloride: 115 mmol/L — ABNORMAL HIGH (ref 98–111)
Creatinine, Ser: 2.36 mg/dL — ABNORMAL HIGH (ref 0.44–1.00)
GFR, Estimated: 21 mL/min — ABNORMAL LOW (ref >60)
Glucose, Bld: 149 mg/dL — ABNORMAL HIGH (ref 70–99)
Potassium: 4.6 mmol/L (ref 3.5–5.1)
Sodium: 157 mmol/L — ABNORMAL HIGH (ref 135–145)
Total Bilirubin: 0.4 mg/dL (ref 0.3–1.2)
Total Protein: 8.4 g/dL — ABNORMAL HIGH (ref 6.5–8.1)

## 2023-01-18 LAB — TSH: TSH: 3.652 u[IU]/mL (ref 0.350–4.500)

## 2023-01-18 LAB — CBG MONITORING, ED: Glucose-Capillary: 116 mg/dL — ABNORMAL HIGH (ref 70–99)

## 2023-01-18 LAB — TROPONIN I (HIGH SENSITIVITY)
Troponin I (High Sensitivity): 32 ng/L — ABNORMAL HIGH (ref <18)
Troponin I (High Sensitivity): 25 ng/L — ABNORMAL HIGH (ref <18)

## 2023-01-18 LAB — URINALYSIS, ROUTINE W REFLEX MICROSCOPIC
Bilirubin Urine: NEGATIVE
Glucose, UA: NEGATIVE mg/dL
Hgb urine dipstick: NEGATIVE
Ketones, ur: NEGATIVE mg/dL
Leukocytes,Ua: NEGATIVE
Nitrite: NEGATIVE
Protein, ur: NEGATIVE mg/dL
Specific Gravity, Urine: 1.019 (ref 1.005–1.030)
pH: 5 (ref 5.0–8.0)

## 2023-01-18 LAB — PROCALCITONIN: Procalcitonin: 0.32 ng/mL

## 2023-01-18 LAB — LACTIC ACID, PLASMA
Lactic Acid, Venous: 3.2 mmol/L (ref 0.5–1.9)
Lactic Acid, Venous: 2.6 mmol/L (ref 0.5–1.9)

## 2023-01-18 LAB — LIPASE, BLOOD: Lipase: 487 U/L — ABNORMAL HIGH (ref 11–51)

## 2023-01-18 LAB — AMMONIA: Ammonia: <10 umol/L (ref 9–35)

## 2023-01-18 MED ORDER — SILVER SULFADIAZINE 1 % EX CREA
1.0000 | TOPICAL_CREAM | Freq: Every day | CUTANEOUS | Status: DC
  Administered 2023-01-19 – 2023-01-24 (×6): 1 via TOPICAL
  Filled 2023-01-18 (×2): qty 85

## 2023-01-18 MED ORDER — SODIUM CHLORIDE 0.9 % IV SOLN
2.0000 g | Freq: Once | INTRAVENOUS | Status: AC
  Administered 2023-01-18: 2 g via INTRAVENOUS
  Filled 2023-01-18: qty 12.5

## 2023-01-18 MED ORDER — DEXTROSE 5 % IV SOLN: INTRAVENOUS | Status: DC

## 2023-01-18 MED ORDER — ACETAMINOPHEN 650 MG RE SUPP: 650.0000 mg | Freq: Four times a day (QID) | RECTAL | Status: DC | PRN

## 2023-01-18 MED ORDER — INSULIN ASPART 100 UNIT/ML IJ SOLN
0.0000 [IU] | INTRAMUSCULAR | Status: DC
  Administered 2023-01-20: 2 [IU] via SUBCUTANEOUS

## 2023-01-18 MED ORDER — VANCOMYCIN HCL 1250 MG/250ML IV SOLN
1250.0000 mg | Freq: Once | INTRAVENOUS | Status: AC
  Administered 2023-01-18: 1250 mg via INTRAVENOUS
  Filled 2023-01-18: qty 250

## 2023-01-18 MED ORDER — ONDANSETRON HCL 4 MG PO TABS: 4.0000 mg | ORAL_TABLET | Freq: Four times a day (QID) | ORAL | Status: DC | PRN

## 2023-01-18 MED ORDER — ONDANSETRON HCL 4 MG/2ML IJ SOLN: 4.0000 mg | Freq: Four times a day (QID) | INTRAMUSCULAR | Status: DC | PRN

## 2023-01-18 MED ORDER — LACTATED RINGERS IV BOLUS
1000.0000 mL | Freq: Once | INTRAVENOUS | Status: AC
  Administered 2023-01-18: 1000 mL via INTRAVENOUS

## 2023-01-18 MED ORDER — LACTATED RINGERS IV BOLUS
500.0000 mL | Freq: Once | INTRAVENOUS | Status: AC
  Administered 2023-01-18: 500 mL via INTRAVENOUS

## 2023-01-18 MED ORDER — ACETAMINOPHEN 325 MG PO TABS
650.0000 mg | ORAL_TABLET | Freq: Four times a day (QID) | ORAL | Status: DC | PRN
  Administered 2023-01-27: 650 mg via ORAL
  Filled 2023-01-18: qty 2

## 2023-01-18 MED ORDER — HEPARIN SODIUM (PORCINE) 5000 UNIT/ML IJ SOLN
5000.0000 [IU] | Freq: Three times a day (TID) | INTRAMUSCULAR | Status: DC
  Administered 2023-01-18 – 2023-01-19 (×3): 5000 [IU] via SUBCUTANEOUS
  Filled 2023-01-18 (×3): qty 1

## 2023-01-18 NOTE — ED Provider Notes (Signed)
[place] EMERGENCY DEPARTMENT AT [facility] Provider Note   CSN: [id number] Arrival date & time: 01/18/23  1750     History Chief Complaint  Patient presents with   Altered Mental Status   Hypertension    HPI Amanda [name] is a 77 y.o. female presenting for altered mental status.   Patient's recorded medical, surgical, social, medication list and allergies were reviewed in the Snapshot window as part of the initial history.   Review of Systems   Review of Systems  Constitutional:  Positive for fatigue and fever. Negative for chills.  HENT:  Negative for ear pain and sore throat.   Eyes:  Negative for pain and visual disturbance.  Respiratory:  Positive for shortness of breath. Negative for cough.   Cardiovascular:  Negative for chest pain and palpitations.  Gastrointestinal:  Negative for abdominal pain and vomiting.  Genitourinary:  Negative for dysuria and hematuria.  Musculoskeletal:  Negative for arthralgias and back pain.  Skin:  Negative for color change and rash.  Neurological:  Negative for seizures and syncope.  Psychiatric/Behavioral:  Positive for agitation and confusion.   All other systems reviewed and are negative.   Physical Exam Updated Vital Signs BP 125/69   Pulse 80   Temp (!) 96.1 F (35.6 C) (Rectal)   Resp 17   SpO2 100%  Physical Exam   ED Course/ Medical Decision Making/ A&P Clinical Course as of 01/18/23 2315  Tue Jan 18, 2023  1806 I was called emergently from a bedside procedure to evaluate this patient as she is GCS 7. 24 hours of altered mental status per her family has been progressive in nature now poorly responsive.  Glucose okay, vital signs notable for tachycardia hypertension tachypnea in triage. History of multifocal pneumonia and urosepsis.  X-ray as a code sepsis due to SIRS criteria and degree of altered mentation with plan for IV fluids, antibiosis for suspected infectious pathology and will plan for  serial reassessments. [CC]    Clinical Course User Index [CC] Glyn Ade, MD    Procedures .Critical Care  Performed by: Glyn Ade, MD Authorized by: Glyn Ade, MD   Critical care provider statement:    Critical care time (minutes):  85   Critical care was necessary to treat or prevent imminent or life-threatening deterioration of the following conditions:  Sepsis, dehydration and metabolic crisis   Critical care was time spent personally by me on the following activities:  Development of treatment plan with patient or surrogate, discussions with consultants, evaluation of patient's response to treatment, examination of patient, ordering and review of laboratory studies, ordering and review of radiographic studies, ordering and performing treatments and interventions, pulse oximetry, re-evaluation of patient's condition and review of old charts   Care discussed with: admitting provider      Medications Ordered in ED Medications  dextrose 5 % solution ( Intravenous New Bag/Given 01/18/23 2301)  insulin aspart (novoLOG) injection 0-9 Units (has no administration in time range)  heparin injection 5,000 Units (5,000 Units Subcutaneous Given 01/18/23 2303)  acetaminophen (TYLENOL) tablet 650 mg (has no administration in time range)    Or  acetaminophen (TYLENOL) suppository 650 mg (has no administration in time range)  ondansetron (ZOFRAN) tablet 4 mg (has no administration in time range)    Or  ondansetron (ZOFRAN) injection 4 mg (has no administration in time range)  silver sulfADIAZINE (SILVADENE) 1 % cream 1 Application (has no administration in time range)  lactated ringers bolus 1,000  mL (0 mLs Intravenous Stopped 01/18/23 2010)  ceFEPIme (MAXIPIME) 2 g in sodium chloride 0.9 % 100 mL IVPB (0 g Intravenous Stopped 01/18/23 1911)  vancomycin (VANCOREADY) IVPB 1250 mg/250 mL (0 mg Intravenous Stopped 01/18/23 2122)  lactated ringers bolus 500 mL (0 mLs Intravenous  Stopped 01/18/23 2122)   Medical Decision Making:   Amanda [name] is a 77 y.o. female who presented to the ED today with multiple symptoms detailed above.    Patient placed on continuous vitals and telemetry monitoring while in ED which was reviewed periodically.  Complete initial physical exam performed, notably the patient  was ill-appearing. During this initial exam, patient met criteria for activation of code sepsis due to presence of the following SIRS criteria as well as suspected infectious etiology:tachypnea,tachycardia,triage CBC with leukocytosis. Reviewed and confirmed nursing documentation for past medical history, family history, social history.    Initial Assessment:   With the patient's presentation of signs and symptoms of sepsis, most likely diagnosis is bacteremia secondary to underlying infection.  Considerations for source were initiated including:Urinary tract infections, abdominal infections such as cholecystitis/cholangitis/appendicitis, pulmonary etiology, bacteremia, skin etiology such as cellulitis or fasciitis, neurologic etiology such as meningitis or encephalitis.  This is most consistent with an acute life/limb threatening illness complicated by underlying chronic conditions.  Initial Plan:  Activated hospital protocol code sepsis including blood cultures, lactic acid screening, and further diagnostic care and management. Therapeutically, resuscitation fluids were considered. Patient has no contraindication to fluid resuscitation and therefore 30 cc of IV fluids per kilogram were administered Therapeutically, antibiotics were administered on a broad-spectrum nature. Undifferentiated source: Vancomycin and cefepime were administered to cover gram-positive and gram-negative high risk infections  Screening labs including CBC and Metabolic panel to evaluate for infectious or metabolic etiology of disease.  CXR to evaluate for structural/infectious intrathoracic  pathology.  Troponin/EKG to evaluate for cardiac pathology Objective evaluation as below reviewed   Initial Study Results:   Laboratory  WBC and lactic acid  EKG EKG was reviewed independently. Rate, rhythm, axis, intervals all examined and without medically relevant abnormality. ST segments without concerns for elevations.    Radiology:  All images reviewed independently. Agree with radiology report at this time.   CT HEAD WO CONTRAST ( )  Result Date: 01/18/2023 CLINICAL DATA:  Head trauma mental status change EXAM: CT HEAD WITHOUT CONTRAST TECHNIQUE: Contiguous axial images were obtained from the base of the skull through the vertex without intravenous contrast. RADIATION DOSE REDUCTION: This exam was performed according to the departmental dose-optimization program which includes automated exposure control, adjustment of the mA and/or kV according to patient size and/or use of iterative reconstruction technique. COMPARISON:  CT brain 10/08/2022 FINDINGS: Brain: No acute territorial infarction, hemorrhage, or intracranial mass. Moderate atrophy. Mild chronic small vessel ischemic changes of the white matter. Prominent ventricles felt secondary to atrophy. Vascular: No hyperdense vessels.  Carotid vascular calcification Skull: Normal. Negative for fracture or focal lesion. Sinuses/Orbits: No acute finding. Other: None IMPRESSION: 1. No CT evidence for acute intracranial abnormality. 2. Atrophy and chronic small vessel ischemic changes of the white matter. Electronically Signed   By: Jasmine Pang M.D.   On: 01/18/2023 20:34   DG Chest Portable 1 View  Result Date: 01/18/2023 CLINICAL DATA:  Altered mental status hypertension EXAM: PORTABLE CHEST 1 VIEW COMPARISON:  10/13/2022 FINDINGS: Multiple skin fold artifact over the right chest. No acute airspace disease or effusion. Normal cardiac size. No pneumothorax IMPRESSION: No active disease. Electronically Signed  By: Jasmine Pang M.D.   On:  01/18/2023 20:31      Consults: Case discussed with hospitalists.   Final Assessment and Plan:   Patient floridly septic.  Difficult to localize source.  Blood cultures in process.  Treated broadly and improving while in the emergency room.  She does have a sacral ulcer that could be a source though apparently is at its baseline per family and family is reporting and mouth sore with drainage that could also be the source though again less likely. Patient arranged for admission with stabilization at this time.  Clinical Impression:  1. Sepsis with acute organ dysfunction and septic shock, due to unspecified organism, unspecified organ dysfunction type (HCC)      Admit   Final Clinical Impression(s) / ED Diagnoses Final diagnoses:  Sepsis with acute organ dysfunction and septic shock, due to unspecified organism, unspecified organ dysfunction type [facility])    Rx / DC Orders ED Discharge Orders     None         Glyn Ade, MD 01/18/23 2315

## 2023-01-18 NOTE — ED Triage Notes (Addendum)
Pt came in via POV & staff pulled her from car, family stated when bringing her in that she isn't usually "like this." Pt slouching in chair to the Rt, responding only to pain, HTN @ 180/150, pulse ranging from 40-109bpm was difficult to get a goof plef, unable to get a temperature during triage, EKG showed NSR, CBG WNL, Pupils 3/equal/reactive. Husband came in when pt being pushed back to a room & reported she was at her baseline last night, can usually walk with help & speaks clearly. Woke up like she presented this morning. Husband explains she had pneumonia & a UTI about a month ago & was treated for it then sent to a rehab to help to strengthen her walking & she had been d/c approx. 1 week ago back to home with him. Plus she does have a dental abscess on the lower Lt side of her mouth.

## 2023-01-18 NOTE — Assessment & Plan Note (Addendum)
Initial tachycardia, WBC 17k.  Pt had SIRS due to AKI and dehydration. Sepsis ruled out.  Procal was elevated at 0.32. pt given IV Rocephin for 7 days. But pt never had a clear source of infection.  All urine and blood cx were negative. RVP also negative.

## 2023-01-18 NOTE — H&P (Signed)
History and Physical    Patient: Amanda [name]:[id number] DOB: February 08, 1946 DOA: 01/18/2023 DOS: the patient was seen and examined on 01/18/2023 PCP: [name], MD  Patient coming from: Home  Chief Complaint:  Chief Complaint  Patient presents with   Altered Mental Status   Hypertension   HPI: Amanda [name] is a 77 y.o. female with medical history significant of moderate dementia.  Pt with 24h of progressively worsening AMS, now poorly responsive with GCS 7  BGL okay  H/o PNA and UTI causing sepsis back in June.  Family reports decreased PO intake recently "can't get her to eat anything".  They wonder about dental abscess.  Also has known decubitus ulcer.   Review of Systems: unable to review all systems due to the inability of the patient to answer questions. Past Medical History:  Diagnosis Date   Dementia (HCC)    Dyslipidemia    History reviewed. No pertinent surgical history. Social History:  reports that she has never smoked. She has never used smokeless tobacco. She reports that she does not drink alcohol and does not use drugs.  No Known Allergies  Family History  Problem Relation Age of Onset   Dementia Mother    Heart disease Maternal Grandmother     Prior to Admission medications   Medication Sig Start Date End Date Taking? Authorizing Provider  acetaminophen (TYLENOL) 325 MG tablet Take 2 tablets (650 mg total) by mouth every 6 (six) hours as needed for moderate pain or fever. Patient not taking: Reported on 01/18/2023 10/19/22   Burnadette Pop, MD  feeding supplement (ENSURE ENLIVE / ENSURE PLUS) LIQD Take 237 mLs by mouth 2 (two) times daily between meals. 10/19/22   Burnadette Pop, MD  Hydroactive Dressings (DYNADERM HYDROCOLL FOAM 4"X4" EX) Apply 1 patch topically daily.    [provider]  polyethylene glycol (MIRALAX / GLYCOLAX) 17 g packet Take 17 g by mouth daily. 10/19/22   Burnadette Pop, MD  silver sulfADIAZINE  (SILVADENE) 1 % cream Apply 1 Application topically daily.    [provider]    Physical Exam: Vitals:   01/18/23 1915 01/18/23 2000 01/18/23 2100 01/18/23 2130  BP: 118/63 125/65 130/76 114/83  Pulse: 88 83 79 81  Resp: 16 15 17 15   Temp:      TempSrc:      SpO2: 100% 100% 100% 100%   Constitutional: Lethargic, GCS 10 ENMT: Dental carrie to rear most left lower molar, but no obvious exudate / drainage as family describes, no tissue erythema. Respiratory: clear to auscultation bilaterally, no wheezing, no crackles. Normal respiratory effort. No accessory muscle use.  Cardiovascular: Regular rate and rhythm, no murmurs / rubs / gallops. No extremity edema. 2+ pedal pulses. No carotid bruits.  Abdomen: no tenderness, No guarding Skin: At least stage 2 sacral decubitus present, no obvious drainage nor surrounding erythema present. Some small bruises on LLE (maybe ~2cm diameter) but no tissue firmness, crepitus, erythema, induration, nor open wound. LLE has edema and compression stocking in place, but this is chronic per family member "Only her left leg gets swollen, never the right". Neurologic: GCS 10  Data Reviewed:     Labs on Admission: I have personally reviewed following labs and imaging studies  CBC: Recent Labs  Lab 01/18/23 1848  WBC 17.8*  NEUTROABS 15.3*  HGB 13.2  HCT 44.2  MCV 86.3  PLT 209   Basic Metabolic Panel: Recent Labs  Lab 01/18/23 1848  NA 157*  K 4.6  CL 115*  CO2 25  GLUCOSE 149*  BUN 138*  CREATININE 2.36*  CALCIUM 9.6   GFR: CrCl cannot be calculated (Unknown ideal weight.). Liver Function Tests: Recent Labs  Lab 01/18/23 1848  AST 48*  ALT 30  ALKPHOS 64  BILITOT 0.4  PROT 8.4*  ALBUMIN 3.3*   Recent Labs  Lab 01/18/23 1848  LIPASE 487*   Recent Labs  Lab 01/18/23 1848  AMMONIA <10   Coagulation Profile: No results for input(s): "INR", "PROTIME" in the last 168 hours. Cardiac Enzymes: No results for  input(s): "CKTOTAL", "CKMB", "CKMBINDEX", "TROPONINI" in the last 168 hours. BNP (last 3 results) No results for input(s): "PROBNP" in the last 8760 hours. HbA1C: No results for input(s): "HGBA1C" in the last 72 hours. CBG: Recent Labs  Lab 01/18/23 1751  GLUCAP 116*   Lipid Profile: No results for input(s): "CHOL", "HDL", "LDLCALC", "TRIG", "CHOLHDL", "LDLDIRECT" in the last 72 hours. Thyroid Function Tests: No results for input(s): "TSH", "T4TOTAL", "FREET4", "T3FREE", "THYROIDAB" in the last 72 hours. Anemia Panel: No results for input(s): "VITAMINB12", "FOLATE", "FERRITIN", "TIBC", "IRON", "RETICCTPCT" in the last 72 hours. Urine analysis:    Component Value Date/Time   COLORURINE YELLOW 01/18/2023 1907   APPEARANCEUR HAZY (A) 01/18/2023 1907   LABSPEC 1.019 01/18/2023 1907   PHURINE 5.0 01/18/2023 1907   GLUCOSEU NEGATIVE 01/18/2023 1907   HGBUR NEGATIVE 01/18/2023 1907   BILIRUBINUR NEGATIVE 01/18/2023 1907   KETONESUR NEGATIVE 01/18/2023 1907   PROTEINUR NEGATIVE 01/18/2023 1907   NITRITE NEGATIVE 01/18/2023 1907   LEUKOCYTESUR NEGATIVE 01/18/2023 1907    Radiological Exams on Admission: CT HEAD WO CONTRAST ( )  Result Date: 01/18/2023 CLINICAL DATA:  Head trauma mental status change EXAM: CT HEAD WITHOUT CONTRAST TECHNIQUE: Contiguous axial images were obtained from the base of the skull through the vertex without intravenous contrast. RADIATION DOSE REDUCTION: This exam was performed according to the departmental dose-optimization program which includes automated exposure control, adjustment of the mA and/or kV according to patient size and/or use of iterative reconstruction technique. COMPARISON:  CT brain 10/08/2022 FINDINGS: Brain: No acute territorial infarction, hemorrhage, or intracranial mass. Moderate atrophy. Mild chronic small vessel ischemic changes of the white matter. Prominent ventricles felt secondary to atrophy. Vascular: No hyperdense vessels.  Carotid  vascular calcification Skull: Normal. Negative for fracture or focal lesion. Sinuses/Orbits: No acute finding. Other: None IMPRESSION: 1. No CT evidence for acute intracranial abnormality. 2. Atrophy and chronic small vessel ischemic changes of the white matter. Electronically Signed   By: Jasmine Pang M.D.   On: 01/18/2023 20:34   DG Chest Portable 1 View  Result Date: 01/18/2023 CLINICAL DATA:  Altered mental status hypertension EXAM: PORTABLE CHEST 1 VIEW COMPARISON:  10/13/2022 FINDINGS: Multiple skin fold artifact over the right chest. No acute airspace disease or effusion. Normal cardiac size. No pneumothorax IMPRESSION: No active disease. Electronically Signed   By: Jasmine Pang M.D.   On: 01/18/2023 20:31    EKG: Independently reviewed.   Assessment and Plan: * Acute metabolic encephalopathy Multifactorial. Sodium 157 and BUN 138 definitely contributing / causing, some or all of this. Addressing sodium and AKF as below first. CT head neg Check TSH Ammonia nl Check MRI brain if not improving with correction of metabolic abnormalities.  SIRS (systemic inflammatory response syndrome) (HCC) Initial tachycardia, WBC 17k.  Question of SIRS / Sepsis with unclear source: UA neg CXR neg but may just be due to her being so dehydrated No obvious  dental infection on exam, but does have dental cary of posterior molar on LL side. Decubitus ulcer is at least stage 2, but no obvious erythema / drainage Got doses of cefepime and vanc in ED Check procalcitonin Get CT maxilofacial to r/o dental abscess Get CT abd pelvis to r/o obvious signs of infection, evaluate the decubitus, etc. Got 1.5L sepsis fluid bolus Tachycardia resolved and BP wnl for the moment Getting further IVF for hypernatremia as above BCx pending Check COVID and RVP  Hypernatremia Severe dehydration due to decreased PO intake. Got 1.5L LR bolus in ED for question of sepsis Putting on D5W at 75 cc/hr for  hypernatremia BMP 6QH Max rate of correction = 12 meq / day  AKI (acute kidney injury) (HCC) Very likely pre-renal / ATN with a very elevated BUN:Cr ratio, clinically appears dehydrated, and sodium of 157 today. IVF: as per hypernatremia above Strict intake and output Serial BMPs Get kidney imaging if not improving rapidly  Decubitus ulcer Wound care consult No obvious erythema, drainage, crepitus.  Moderate dementia (HCC) Has acute encephalopathy today with initial GCS 7. At baseline, she is confused to time and location.   Hypertension Chart diagnosis, doesn't appear to be on any chronic meds for this.      Advance Care Planning:   Code Status: Full Code Per husband  Consults: None  Family Communication: Husband at bedside  Severity of Illness: The appropriate patient status for this patient is INPATIENT. Inpatient status is judged to be reasonable and necessary in order to provide the required intensity of service to ensure the patient's safety. The patient's presenting symptoms, physical exam findings, and initial radiographic and laboratory data in the context of their chronic comorbidities is felt to place them at high risk for further clinical deterioration. Furthermore, it is not anticipated that the patient will be medically stable for discharge from the hospital within 2 midnights of admission.   * I certify that at the point of admission it is my clinical judgment that the patient will require inpatient hospital care spanning beyond 2 midnights from the point of admission due to high intensity of service, high risk for further deterioration and high frequency of surveillance required.*  Author: Hillary Bow., DO 01/18/2023 10:34 PM  For on call review [email or web address].

## 2023-01-18 NOTE — Assessment & Plan Note (Addendum)
Dementia for years. at baseline, wheelchair bound (walked 80 ft with walker with assistance in early September at rehab - wheelchair bound since being home), she's disoriented typically, but will communicate.   On admission, pt had acute encephalopathy with initial GCS 7. At baseline, she is confused to time and location.   02-01-2023 definitively, pt's cognition has not improved with good enteral nutrition in the past 7 days. It has only caused massive diarrhea in her due to refeeding syndrome. Her dementia is not going to get better. Family has declined PEG tube. She really needs hospice care. I'm not sure that family is ready for that step. Will have Dr. Lowell Guitar see the patient tomorrow. He was pt's attending last week.

## 2023-01-18 NOTE — Assessment & Plan Note (Addendum)
Wound care consult on pt 01-19-2023 No obvious erythema, drainage, crepitus. Location: Sacral Pressure Injury POA: Yes.  Measurement:7cm x 7cm x 0.1cm (this included the area of deep tissue injury) with the partial thickness scattered skin loss  02-01-2023 discussed with pt's husband. He lives alone with his wife. Both children are out of state. Son lives in [address] [place]) and dtr lives in [place].  Husband unable to provide the care pt needs in terms of wound care. He does not have 24 hours assistance at home for patient.  Therefore, pt's only choice is SNF placement. Discussed with husband that he and family ultimately will decide what SNF pt will go to, but she will be going to SNF. Unless family decides on hospice care.  Repeat Wound consult 02-01-2023. This has evolved into an Unstageable sacral pressure injury; 50% red, 50% yellow slough, 6X6X.2cm, mod amt tan drainage.

## 2023-01-18 NOTE — Sepsis Progress Note (Signed)
Elink monitoring for the code sepsis protocol.  

## 2023-01-18 NOTE — Progress Notes (Signed)
ED Pharmacy Antibiotic Sign Off An antibiotic consult was received from an ED provider for cefepime and vancomycin per pharmacy dosing for sepsis. A chart review was completed to assess appropriateness.  A single dose of cefepime was placed by the ED provider.   The following one time order(s) were placed per pharmacy consult:  vancomycin 1250 mg x 1 dose   Further antibiotic and/or antibiotic pharmacy consults should be ordered by the admitting provider if indicated.   Thank you for allowing pharmacy to be a part of this patient's care.   Delmar Landau, PharmD, BCPS 01/18/2023 6:17 PM ED Clinical Pharmacist -  [phone]

## 2023-01-18 NOTE — Progress Notes (Signed)
   Subjective:    Patient ID: Amanda [name], female    DOB: 07-25-1945, 77 y.o.   MRN: [id number]  HPI She is here for follow-up visit after recent hospitalization and subsequent referral to rehab unit.  She has been home for the last couple weeks and her husband and son and daughter have been helping take care of her.  She was diagnosed with pneumonia as well as a UTI.  Her husband states that in the last couple days she has seemed to have taken a turn for the worse in terms of less responsive and today has not been able to eat much.  Apparently they do have [facility] coming out to help with care.  She apparently also has a decubitus ulcer and Dr. Wallace Cullens has been contacted.  He did call in medication in for her but her husband has not made an appointment for follow-up concerning that.  Apparently there is also an issue of possible abscessed tooth.   Review of Systems     Objective:    Physical Exam The patient appears obtunded and not responding to verbal stimuli.  Respiratory rate is approximately 48.  Pulse ox was very difficult to do.  Blood pressure is recorded.  Apparently the husband did get a pulse ox of 66 at home but I am not sure when it was done.   Did not evaluate her mouth.     Assessment & Plan:  History of pneumonia  History of UTI  Obtunded It seems that in the last couple days she has definitely taken a turn for the worse in terms of becoming less responsive, not eating and apparently the husband did get a pulse ox at home of 66.  Today it was difficult to get a good pulse ox out of her as well as a good blood pressure reading but definitely needs to be evaluated more thoroughly.  Will need to do a cath urine and also check her mouth and get a good look at the decubitus.  I sent her to the emergency room for further evaluation.

## 2023-01-18 NOTE — Assessment & Plan Note (Signed)
Chart diagnosis, doesn't appear to be on any chronic meds for this.

## 2023-01-18 NOTE — Assessment & Plan Note (Addendum)
On admission, pt's etiology of her acute metabolic encephalopathy was/is multifactorial. Admission Sodium 157 and BUN 138 definitely contributing / causing, some or all of this. Likely multifactorial (hypernatremia, uremia/AKI, dementia) but inciting cause unclear.  No clear infectious cause (CXR without active disease, CT maxillofacial without notable findings, CT abd/pelvis without acute findings, decub doesn't appear infected) TSH wnl, ammonia wnl  B12 (elevated), folate low -> supplement EEG with moderate to severe diffuse encephalopathy, no seizures or epileptiform discharges CT head without acute abnormality MRI brain without acute abnormality   01-29-2023 Pt has had tube feedings for 4 days now without any improvement in her cognition. I think she has severe dementia and this will not improve even with adequate enteral nutrition.  Pt's mental status has not improved since admission. This is likely progressive decline of her already established dementia.  Pt's hypernatremia and AKI resolved with IVF  Pt's mentation has not significantly improved.  Palliative care consulted and they discussed with pt's husband on 01-25-2023. Limited Cortrak feedings will start today. Cortrak placed on 01-26-2023.  01-28-2023. Pt has been on tube feedings since 01-26-2023. No appreciable change in her mentation with extra nutrition provided by NG feeds. Having diarrhea from refeeding syndrome.  01-30-2023. Serum albumin remains low at 2.1 g/dl despite 5 days of tube feedings. Adding pre-albumin today's labs. Pt still without any appreciable positive change in her cognition despite tube feedings. 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 Not the few weeks that family states she had. This is observation is further justified in that pt's serum albumin was 4.0 on 09-06-2022. But on 10-10-2022, her  serum albumin had dropped to 2.7. so sometime between may and June is when she stopped eating enough protein to keep up her albumin stores. So basically 4 months without any significant protein intake.  This is drastically different than family reported "only a few weeks" that pt has not been eating well. In my opinion, pt really needs hospice care.  01-31-2023.  Today is day #6 of enteral feeds.  Her prealbumin is low at 7.  This signifies significant malnutrition over the last several weeks to months.  This is in contrast to the family reported history of only 2 to 4 weeks of malnutrition.  Patient is somewhat more awake but still nonverbal.  Awaiting palliative medicine to reengage with the family.  As discussed before during my family meeting on 01/28/2023, we will not continue enteral feedings past 10 days via NG tube.  Family will need to decide if they want to pursue PEG tube at that time.  02-01-2023 pt has completed 7 days of tube feeds. Pt is more awake now but cognition is still quite poor. Definitively, tube feeds and enteral nutrition has not helped her dementia. Discussed this at length with pt's husband [name] on the phone. Additional days of enteral feeds will not help and only place her at risk for aspiration. [name] says that he and family do not want pt to have long term PEG. I discussed that NG/Cortrak tube would come out today.   Pt to remain on pureed diet. Will ask same ST who saw her last week to re-evaluate her. I think she will likely remain on pureed diet but will have formal re-consult by ST to determine if her diet can be upgraded from pureed.

## 2023-01-18 NOTE — Assessment & Plan Note (Addendum)
On admission, AKI Very likely pre-renal / ATN with a very elevated BUN:Cr ratio, clinically appears dehydrated, and sodium of 157 today.  Pt treated with IVF and her AKI resolved.

## 2023-01-18 NOTE — Assessment & Plan Note (Addendum)
Severe dehydration due to decreased PO intake. Treated with 1.5L LR bolus in ED for question of sepsis. Sepsis ruled out.  Pt then given D5W at 75 cc/hr for hypernatremia. Pt's hypernatremia resolved after IVF.  Receiving free water via NG/cortrak. Her hypernatremia has resolved.  02-01-2023 cortrak to be removed today. Pt will need to take all of her hydration via free water by mouth. I am not optimistic that she can stay hydrated enough to keep her out of the hospital.

## 2023-01-19 ENCOUNTER — Inpatient Hospital Stay (HOSPITAL_COMMUNITY): Payer: PPO

## 2023-01-19 DIAGNOSIS — M7989 Other specified soft tissue disorders: Secondary | ICD-10-CM

## 2023-01-19 DIAGNOSIS — G9341 Metabolic encephalopathy: Secondary | ICD-10-CM | POA: Diagnosis not present

## 2023-01-19 LAB — CBC
HCT: 36 % (ref 36.0–46.0)
Hemoglobin: 10.5 g/dL — ABNORMAL LOW (ref 12.0–15.0)
MCH: 25.4 pg — ABNORMAL LOW (ref 26.0–34.0)
MCHC: 29.2 g/dL — ABNORMAL LOW (ref 30.0–36.0)
MCV: 87 fL (ref 80.0–100.0)
Platelets: 162 10*3/uL (ref 150–400)
RBC: 4.14 MIL/uL (ref 3.87–5.11)
RDW: 15.7 % — ABNORMAL HIGH (ref 11.5–15.5)
WBC: 16.3 10*3/uL — ABNORMAL HIGH (ref 4.0–10.5)
nRBC: 0 % (ref 0.0–0.2)

## 2023-01-19 LAB — BASIC METABOLIC PANEL
Anion gap: 12 (ref 5–15)
Anion gap: 14 (ref 5–15)
Anion gap: 12 (ref 5–15)
Anion gap: 14 (ref 5–15)
BUN: 131 mg/dL — ABNORMAL HIGH (ref 8–23)
BUN: 126 mg/dL — ABNORMAL HIGH (ref 8–23)
BUN: 115 mg/dL — ABNORMAL HIGH (ref 8–23)
BUN: 109 mg/dL — ABNORMAL HIGH (ref 8–23)
CO2: 26 mmol/L (ref 22–32)
CO2: 24 mmol/L (ref 22–32)
CO2: 21 mmol/L — ABNORMAL LOW (ref 22–32)
CO2: 25 mmol/L (ref 22–32)
Calcium: 8.9 mg/dL (ref 8.9–10.3)
Calcium: 8.7 mg/dL — ABNORMAL LOW (ref 8.9–10.3)
Calcium: 8.6 mg/dL — ABNORMAL LOW (ref 8.9–10.3)
Calcium: 8.7 mg/dL — ABNORMAL LOW (ref 8.9–10.3)
Chloride: 118 mmol/L — ABNORMAL HIGH (ref 98–111)
Chloride: 115 mmol/L — ABNORMAL HIGH (ref 98–111)
Chloride: 118 mmol/L — ABNORMAL HIGH (ref 98–111)
Chloride: 114 mmol/L — ABNORMAL HIGH (ref 98–111)
Creatinine, Ser: 1.71 mg/dL — ABNORMAL HIGH (ref 0.44–1.00)
Creatinine, Ser: 1.76 mg/dL — ABNORMAL HIGH (ref 0.44–1.00)
Creatinine, Ser: 1.64 mg/dL — ABNORMAL HIGH (ref 0.44–1.00)
Creatinine, Ser: 1.7 mg/dL — ABNORMAL HIGH (ref 0.44–1.00)
GFR, Estimated: 30 mL/min — ABNORMAL LOW (ref >60)
GFR, Estimated: 29 mL/min — ABNORMAL LOW (ref >60)
GFR, Estimated: 32 mL/min — ABNORMAL LOW (ref >60)
GFR, Estimated: 31 mL/min — ABNORMAL LOW (ref >60)
Glucose, Bld: 123 mg/dL — ABNORMAL HIGH (ref 70–99)
Glucose, Bld: 168 mg/dL — ABNORMAL HIGH (ref 70–99)
Glucose, Bld: 123 mg/dL — ABNORMAL HIGH (ref 70–99)
Glucose, Bld: 119 mg/dL — ABNORMAL HIGH (ref 70–99)
Potassium: 4.3 mmol/L (ref 3.5–5.1)
Potassium: 3.8 mmol/L (ref 3.5–5.1)
Potassium: 3.9 mmol/L (ref 3.5–5.1)
Potassium: 3.7 mmol/L (ref 3.5–5.1)
Sodium: 156 mmol/L — ABNORMAL HIGH (ref 135–145)
Sodium: 153 mmol/L — ABNORMAL HIGH (ref 135–145)
Sodium: 151 mmol/L — ABNORMAL HIGH (ref 135–145)
Sodium: 153 mmol/L — ABNORMAL HIGH (ref 135–145)

## 2023-01-19 LAB — RESPIRATORY PANEL BY PCR

## 2023-01-19 LAB — GLUCOSE, CAPILLARY
Glucose-Capillary: 103 mg/dL — ABNORMAL HIGH (ref 70–99)
Glucose-Capillary: 112 mg/dL — ABNORMAL HIGH (ref 70–99)
Glucose-Capillary: 93 mg/dL (ref 70–99)

## 2023-01-19 LAB — CBG MONITORING, ED
Glucose-Capillary: 100 mg/dL — ABNORMAL HIGH (ref 70–99)
Glucose-Capillary: 117 mg/dL — ABNORMAL HIGH (ref 70–99)
Glucose-Capillary: 132 mg/dL — ABNORMAL HIGH (ref 70–99)
Glucose-Capillary: 72 mg/dL (ref 70–99)

## 2023-01-19 LAB — SARS CORONAVIRUS 2 BY RT PCR: SARS Coronavirus 2 by RT PCR: NEGATIVE

## 2023-01-19 LAB — LIPASE, BLOOD: Lipase: 383 U/L — ABNORMAL HIGH (ref 11–51)

## 2023-01-19 MED ORDER — HEPARIN (PORCINE) 25000 UT/250ML-% IV SOLN
900.0000 [IU]/h | INTRAVENOUS | Status: DC
  Administered 2023-01-19: 900 [IU]/h via INTRAVENOUS
  Filled 2023-01-19: qty 250

## 2023-01-19 MED ORDER — HEPARIN BOLUS VIA INFUSION
3500.0000 [IU] | Freq: Once | INTRAVENOUS | Status: AC
  Administered 2023-01-19: 3500 [IU] via INTRAVENOUS
  Filled 2023-01-19: qty 3500

## 2023-01-19 MED ORDER — THIAMINE HCL 100 MG/ML IJ SOLN
500.0000 mg | Freq: Three times a day (TID) | INTRAVENOUS | Status: AC
  Administered 2023-01-19 – 2023-01-22 (×9): 500 mg via INTRAVENOUS
  Filled 2023-01-19 (×11): qty 5

## 2023-01-19 MED ORDER — THIAMINE HCL 100 MG/ML IJ SOLN
100.0000 mg | Freq: Every day | INTRAMUSCULAR | Status: DC
  Administered 2023-01-27 – 2023-01-31 (×5): 100 mg via INTRAVENOUS
  Filled 2023-01-19 (×5): qty 2

## 2023-01-19 MED ORDER — VANCOMYCIN VARIABLE DOSE PER UNSTABLE RENAL FUNCTION (PHARMACIST DOSING): Status: DC

## 2023-01-19 MED ORDER — THIAMINE HCL 100 MG/ML IJ SOLN
250.0000 mg | Freq: Every day | INTRAVENOUS | Status: AC
  Administered 2023-01-22 – 2023-01-26 (×5): 250 mg via INTRAVENOUS
  Filled 2023-01-19 (×6): qty 2.5

## 2023-01-19 MED ORDER — SODIUM CHLORIDE 0.9 % IV SOLN: 2.0000 g | INTRAVENOUS | Status: DC

## 2023-01-19 MED ORDER — SODIUM CHLORIDE 0.9 % IV SOLN
2.0000 g | INTRAVENOUS | Status: AC
  Administered 2023-01-19 – 2023-01-25 (×7): 2 g via INTRAVENOUS
  Filled 2023-01-19 (×8): qty 20

## 2023-01-19 NOTE — ED Notes (Signed)
Bair hugger applied per providers orders, provider notified of rectal temp of 95.8.

## 2023-01-19 NOTE — ED Notes (Signed)
MD made aware of sodium 156. No new orders that this time.

## 2023-01-19 NOTE — ED Notes (Signed)
ED TO INPATIENT HANDOFF REPORT  ED Nurse Name and Phone #: Grover Canavan RN   S Name/Age/Gender Amanda [name] 77 y.o. female Room/Bed: 002C/002C  Code Status   Code Status: Full Code  Home/SNF/Other Home Patient oriented to: self Is this baseline? No   Triage Complete: Triage complete  Chief Complaint Hypernatremia [E87.0]  Triage Note Pt came in via POV & staff pulled her from car, family stated when bringing her in that she isn't usually "like this." Pt slouching in chair to the Rt, responding only to pain, HTN @ 180/150, pulse ranging from 40-109bpm was difficult to get a goof plef, unable to get a temperature during triage, EKG showed NSR, CBG WNL, Pupils 3/equal/reactive. Husband came in when pt being pushed back to a room & reported she was at her baseline last night, can usually walk with help & speaks clearly. Woke up like she presented this morning. Husband explains she had pneumonia & a UTI about a month ago & was treated for it then sent to a rehab to help to strengthen her walking & she had been d/c approx. 1 week ago back to home with him. Plus she does have a dental abscess on the lower Lt side of her mouth.    Allergies No Known Allergies  Level of Care/Admitting Diagnosis ED Disposition     ED Disposition  Admit   Condition  --   Comment  Hospital [facility]: [facility] [[id number]]  Level of Care: Progressive [102]  Admit to Progressive based on following criteria: MULTISYSTEM THREATS such as stable sepsis, metabolic/electrolyte imbalance with or without encephalopathy that is responding to early treatment.  May admit patient to [facility] or [facility] if equivalent level of care is available:: No  Covid Evaluation: Symptomatic Person Under Investigation (PUI) or recent exposure (last 10 days) *Testing Required*  Diagnosis: Hypernatremia [034742]  Admitting Physician: Wyvonnia Dusky  Attending Physician: Hillary Bow [phone]   Certification:: I certify this patient will need inpatient services for at least 2 midnights  Expected Medical Readiness: 01/23/2023          B Medical/Surgery History Past Medical History:  Diagnosis Date   Dementia (HCC)    Dyslipidemia    History reviewed. No pertinent surgical history.   A IV Location/Drains/Wounds Patient Lines/Drains/Airways Status     Active Line/Drains/Airways     Name Placement date Placement time Site Days   Peripheral IV 01/18/23 20 G 1.88" Anterior;Right;Upper Arm 01/18/23  1845  Arm  1   Pressure Injury 01/19/23 Sacrum Medial Deep Tissue Pressure Injury - Purple or maroon localized area of discolored intact skin or blood-filled blister due to damage of underlying soft tissue from pressure and/or shear. patient seen in ED per [facility] nursing 01/19/23  1029  -- less than 1            Intake/Output Last 24 hours  Intake/Output Summary (Last 24 hours) at 01/19/2023 1507 Last data filed at 01/18/2023 2122 Gross per 24 hour  Intake 1850 ml  Output --  Net 1850 ml    Labs/Imaging Results for orders placed or performed during the hospital encounter of 01/18/23 (from the past 48 hour(s))  CBG monitoring, ED     Status: Abnormal   Collection Time: 01/18/23  5:51 PM  Result Value Ref Range   Glucose-Capillary 116 (H) 70 - 99 mg/dL    Comment: Glucose reference range applies only to samples taken after fasting for at least 8  hours.  CBC with Differential     Status: Abnormal   Collection Time: 01/18/23  6:48 PM  Result Value Ref Range   WBC 17.8 (H) 4.0 - 10.5 K/uL   RBC 5.12 (H) 3.87 - 5.11 MIL/uL   Hemoglobin 13.2 12.0 - 15.0 g/dL   HCT 14.7 82.9 - 56.2 %   MCV 86.3 80.0 - 100.0 fL   MCH 25.8 (L) 26.0 - 34.0 pg   MCHC 29.9 (L) 30.0 - 36.0 g/dL   RDW 13.0 (H) 86.5 - 78.4 %   Platelets 209 150 - 400 K/uL   nRBC 0.0 0.0 - 0.2 %   Neutrophils Relative % 87 %   Neutro Abs 15.3 (H) 1.7 - 7.7 K/uL   Lymphocytes Relative 5 %   Lymphs Abs 0.9 0.7  - 4.0 K/uL   Monocytes Relative 7 %   Monocytes Absolute 1.3 (H) 0.1 - 1.0 K/uL   Eosinophils Relative 0 %   Eosinophils Absolute 0.0 0.0 - 0.5 K/uL   Basophils Relative 0 %   Basophils Absolute 0.1 0.0 - 0.1 K/uL   Immature Granulocytes 1 %   Abs Immature Granulocytes 0.25 (H) 0.00 - 0.07 K/uL    Comment: Performed at [facility], [address] [place], [place] [address]  Comprehensive metabolic panel     Status: Abnormal   Collection Time: 01/18/23  6:48 PM  Result Value Ref Range   Sodium 157 (H) 135 - 145 mmol/L   Potassium 4.6 3.5 - 5.1 mmol/L   Chloride 115 (H) 98 - 111 mmol/L   CO2 25 22 - 32 mmol/L   Glucose, Bld 149 (H) 70 - 99 mg/dL    Comment: Glucose reference range applies only to samples taken after fasting for at least 8 hours.   BUN 138 (H) 8 - 23 mg/dL   Creatinine, Ser 5.28 (H) 0.44 - 1.00 mg/dL   Calcium 9.6 8.9 - 41.3 mg/dL   Total Protein 8.4 (H) 6.5 - 8.1 g/dL   Albumin 3.3 (L) 3.5 - 5.0 g/dL   AST 48 (H) 15 - 41 U/L   ALT 30 0 - 44 U/L   Alkaline Phosphatase 64 38 - 126 U/L   Total Bilirubin 0.4 0.3 - 1.2 mg/dL   GFR, Estimated 21 (L) >60 mL/min    Comment: (NOTE) Calculated using the CKD-EPI Creatinine Equation (2021)    Anion gap 17 (H) 5 - 15    Comment: Performed at [facility], [address], [place], [place] [address]  Troponin I (High Sensitivity)     Status: Abnormal   Collection Time: 01/18/23  6:48 PM  Result Value Ref Range   Troponin I (High Sensitivity) 32 (H) <18 ng/L    Comment: (NOTE) Elevated high sensitivity troponin I (hsTnI) values and significant  changes across serial measurements may suggest ACS but many other  chronic and acute conditions are known to elevate hsTnI results.  Refer to the "Links" section for chest pain algorithms and additional  guidance. Performed at [facility], [address], [place], [place] [address]   Blood culture (routine x 2)     Status: None (Preliminary result)    Collection Time: 01/18/23  6:48 PM   Specimen: BLOOD RIGHT ARM  Result Value Ref Range   Specimen Description BLOOD RIGHT ARM    Special Requests      BOTTLES DRAWN AEROBIC AND ANAEROBIC Blood Culture adequate volume   Culture  NO GROWTH < 12 HOURS Performed at [facility], [address], [place], [place] [address]    Report Status PENDING   Ammonia     Status: None   Collection Time: 01/18/23  6:48 PM  Result Value Ref Range   Ammonia <10 9 - 35 umol/L    Comment: Performed at [facility], [address] [place], [place] [address]  Lipase, blood     Status: Abnormal   Collection Time: 01/18/23  6:48 PM  Result Value Ref Range   Lipase 487 (H) 11 - 51 U/L    Comment: RESULTS CONFIRMED BY MANUAL DILUTION CORRECTED RESULTS CALLED TO: Vicente Serene, RN @ 2013 01/18/23 BY SEKDAHL Performed at [facility], [address], [place], [place] [address] CORRECTED ON 10/01 AT 2013: PREVIOUSLY REPORTED AS 631 RESULT CONFIRMED BY MANUAL DILUTION   Lactic acid, plasma     Status: Abnormal   Collection Time: 01/18/23  6:48 PM  Result Value Ref Range   Lactic Acid, Venous 3.2 (HH) 0.5 - 1.9 mmol/L    Comment: CRITICAL RESULT CALLED TO, READ BACK BY AND VERIFIED WITH H.LINEBERRY,RN @1922  01/18/2023 [email or web address] Performed at [facility], [address], [place], [place] [address]   Blood culture (routine x 2)     Status: None (Preliminary result)   Collection Time: 01/18/23  6:49 PM   Specimen: BLOOD RIGHT ARM  Result Value Ref Range   Specimen Description BLOOD RIGHT ARM    Special Requests      BOTTLES DRAWN AEROBIC AND ANAEROBIC Blood Culture adequate volume   Culture      NO GROWTH < 12 HOURS Performed at [facility], [address], [place], [place] [address]    Report Status PENDING   Urinalysis, Routine w reflex microscopic -Urine, Clean Catch     Status: Abnormal   Collection Time: 01/18/23  7:07 PM  Result Value Ref Range   Color, Urine  YELLOW YELLOW   APPearance HAZY (A) CLEAR   Specific Gravity, Urine 1.019 1.005 - 1.030   pH 5.0 5.0 - 8.0   Glucose, UA NEGATIVE NEGATIVE mg/dL   Hgb urine dipstick NEGATIVE NEGATIVE   Bilirubin Urine NEGATIVE NEGATIVE   Ketones, ur NEGATIVE NEGATIVE mg/dL   Protein, ur NEGATIVE NEGATIVE mg/dL   Nitrite NEGATIVE NEGATIVE   Leukocytes,Ua NEGATIVE NEGATIVE    Comment: Performed at [facility], [address], [place], [place] [address]  Rapid urine drug screen (hospital performed)     Status: None   Collection Time: 01/18/23  7:07 PM  Result Value Ref Range   Opiates NONE DETECTED NONE DETECTED   Cocaine NONE DETECTED NONE DETECTED   Benzodiazepines NONE DETECTED NONE DETECTED   Amphetamines NONE DETECTED NONE DETECTED   Tetrahydrocannabinol NONE DETECTED NONE DETECTED   Barbiturates NONE DETECTED NONE DETECTED    Comment: (NOTE) DRUG SCREEN FOR MEDICAL PURPOSES ONLY.  IF CONFIRMATION IS NEEDED FOR ANY PURPOSE, NOTIFY LAB WITHIN 5 DAYS.  LOWEST DETECTABLE LIMITS FOR URINE DRUG SCREEN Drug Class                     Cutoff (ng/mL) Amphetamine and metabolites    1000 Barbiturate and metabolites    200 Benzodiazepine                 200 Opiates and metabolites        300 Cocaine and metabolites  300 THC                            50 Performed at [facility], [address], [place], [place] [address]   Lactic acid, plasma     Status: Abnormal   Collection Time: 01/18/23  8:42 PM  Result Value Ref Range   Lactic Acid, Venous 2.6 (HH) 0.5 - 1.9 mmol/L    Comment: CRITICAL VALUE NOTED. VALUE IS CONSISTENT WITH PREVIOUSLY REPORTED/CALLED VALUE Performed at [facility], [address], [place], [place] [address]   Troponin I (High Sensitivity)     Status: Abnormal   Collection Time: 01/18/23  8:42 PM  Result Value Ref Range   Troponin I (High Sensitivity) 25 (H) <18 ng/L    Comment: (NOTE) Elevated high sensitivity troponin I (hsTnI) values and  significant  changes across serial measurements may suggest ACS but many other  chronic and acute conditions are known to elevate hsTnI results.  Refer to the "Links" section for chest pain algorithms and additional  guidance. Performed at [facility], [address], [place], [place] [address]   Procalcitonin     Status: None   Collection Time: 01/18/23  8:42 PM  Result Value Ref Range   Procalcitonin 0.32 ng/mL    Comment:        Interpretation: PCT (Procalcitonin) <= 0.5 ng/mL: Systemic infection (sepsis) is not likely. Local bacterial infection is possible. (NOTE)       Sepsis PCT Algorithm           Lower Respiratory Tract                                      Infection PCT Algorithm    ----------------------------     ----------------------------         PCT < 0.25 ng/mL                PCT < 0.10 ng/mL          Strongly encourage             Strongly discourage   discontinuation of antibiotics    initiation of antibiotics    ----------------------------     -----------------------------       PCT 0.25 - 0.50 ng/mL            PCT 0.10 - 0.25 ng/mL               OR       >80% decrease in PCT            Discourage initiation of                                            antibiotics      Encourage discontinuation           of antibiotics    ----------------------------     -----------------------------         PCT >= 0.50 ng/mL              PCT 0.26 - 0.50 ng/mL               AND        <80% decrease in  PCT             Encourage initiation of                                             antibiotics       Encourage continuation           of antibiotics    ----------------------------     -----------------------------        PCT >= 0.50 ng/mL                  PCT > 0.50 ng/mL               AND         increase in PCT                  Strongly encourage                                      initiation of antibiotics    Strongly encourage escalation           of  antibiotics                                     -----------------------------                                           PCT <= 0.25 ng/mL                                                 OR                                        > 80% decrease in PCT                                      Discontinue / Do not initiate                                             antibiotics  Performed at [facility], [address] [place], [place] [address]   TSH     Status: None   Collection Time: 01/18/23  8:42 PM  Result Value Ref Range   TSH 3.652 0.350 - 4.500 uIU/mL    Comment: Performed by a 3rd Generation assay with a functional sensitivity of <=0.01 uIU/mL. Performed at [facility], [address] [place], [place] [address]   SARS Coronavirus 2 by RT PCR (hospital order, performed in [facility] hospital lab) *cepheid single result test* Anterior Nasal Swab     Status: None   Collection Time: 01/18/23 11:09 PM  Specimen: Anterior Nasal Swab  Result Value Ref Range   SARS Coronavirus 2 by RT PCR NEGATIVE NEGATIVE    Comment: Performed at [facility], [address] [place], [place] [address]  Respiratory (~20 pathogens) panel by PCR     Status: None   Collection Time: 01/18/23 11:09 PM   Specimen: Anterior Nasal Swab; Respiratory  Result Value Ref Range   Adenovirus NOT DETECTED NOT DETECTED   Coronavirus 229E NOT DETECTED NOT DETECTED    Comment: (NOTE) The Coronavirus on the Respiratory Panel, DOES NOT test for the novel  Coronavirus (2019 nCoV)    Coronavirus HKU1 NOT DETECTED NOT DETECTED   Coronavirus NL63 NOT DETECTED NOT DETECTED   Coronavirus OC43 NOT DETECTED NOT DETECTED   Metapneumovirus NOT DETECTED NOT DETECTED   Rhinovirus / Enterovirus NOT DETECTED NOT DETECTED   Influenza A NOT DETECTED NOT DETECTED   Influenza B NOT DETECTED NOT DETECTED   Parainfluenza Virus 1 NOT DETECTED NOT DETECTED   Parainfluenza Virus 2 NOT DETECTED NOT DETECTED    Parainfluenza Virus 3 NOT DETECTED NOT DETECTED   Parainfluenza Virus 4 NOT DETECTED NOT DETECTED   Respiratory Syncytial Virus NOT DETECTED NOT DETECTED   Bordetella pertussis NOT DETECTED NOT DETECTED   Bordetella Parapertussis NOT DETECTED NOT DETECTED   Chlamydophila pneumoniae NOT DETECTED NOT DETECTED   Mycoplasma pneumoniae NOT DETECTED NOT DETECTED    Comment: Performed at [facility], [address] [place], [place] [address]  CBG monitoring, ED     Status: None   Collection Time: 01/19/23 12:07 AM  Result Value Ref Range   Glucose-Capillary 72 70 - 99 mg/dL    Comment: Glucose reference range applies only to samples taken after fasting for at least 8 hours.  Basic metabolic panel     Status: Abnormal   Collection Time: 01/19/23  1:03 AM  Result Value Ref Range   Sodium 156 (H) 135 - 145 mmol/L   Potassium 4.3 3.5 - 5.1 mmol/L   Chloride 118 (H) 98 - 111 mmol/L   CO2 26 22 - 32 mmol/L   Glucose, Bld 123 (H) 70 - 99 mg/dL    Comment: Glucose reference range applies only to samples taken after fasting for at least 8 hours.   BUN 131 (H) 8 - 23 mg/dL   Creatinine, Ser 0.98 (H) 0.44 - 1.00 mg/dL   Calcium 8.9 8.9 - 11.9 mg/dL   GFR, Estimated 30 (L) >60 mL/min    Comment: (NOTE) Calculated using the CKD-EPI Creatinine Equation (2021)    Anion gap 12 5 - 15    Comment: Performed at [facility], [address] [place], [place] [address]  CBG monitoring, ED     Status: Abnormal   Collection Time: 01/19/23  3:57 AM  Result Value Ref Range   Glucose-Capillary 117 (H) 70 - 99 mg/dL    Comment: Glucose reference range applies only to samples taken after fasting for at least 8 hours.  Basic metabolic panel     Status: Abnormal   Collection Time: 01/19/23  7:15 AM  Result Value Ref Range   Sodium 153 (H) 135 - 145 mmol/L   Potassium 3.8 3.5 - 5.1 mmol/L   Chloride 115 (H) 98 - 111 mmol/L   CO2 24 22 - 32 mmol/L   Glucose, Bld 168 (H) 70 - 99 mg/dL    Comment:  Glucose reference range applies only to samples taken after fasting for at least  8 hours.   BUN 126 (H) 8 - 23 mg/dL   Creatinine, Ser 1.61 (H) 0.44 - 1.00 mg/dL   Calcium 8.7 (L) 8.9 - 10.3 mg/dL   GFR, Estimated 29 (L) >60 mL/min    Comment: (NOTE) Calculated using the CKD-EPI Creatinine Equation (2021)    Anion gap 14 5 - 15    Comment: Performed at [facility], [address], [place], [place] [address]  CBC     Status: Abnormal   Collection Time: 01/19/23  7:15 AM  Result Value Ref Range   WBC 16.3 (H) 4.0 - 10.5 K/uL   RBC 4.14 3.87 - 5.11 MIL/uL   Hemoglobin 10.5 (L) 12.0 - 15.0 g/dL   HCT 54.0 98.1 - 19.1 %   MCV 87.0 80.0 - 100.0 fL   MCH 25.4 (L) 26.0 - 34.0 pg   MCHC 29.2 (L) 30.0 - 36.0 g/dL   RDW 47.8 (H) 29.5 - 62.1 %   Platelets 162 150 - 400 K/uL   nRBC 0.0 0.0 - 0.2 %    Comment: Performed at [facility], [address], [place], [place] [address]  CBG monitoring, ED     Status: Abnormal   Collection Time: 01/19/23  8:06 AM  Result Value Ref Range   Glucose-Capillary 100 (H) 70 - 99 mg/dL    Comment: Glucose reference range applies only to samples taken after fasting for at least 8 hours.  CBG monitoring, ED     Status: Abnormal   Collection Time: 01/19/23 12:15 PM  Result Value Ref Range   Glucose-Capillary 132 (H) 70 - 99 mg/dL    Comment: Glucose reference range applies only to samples taken after fasting for at least 8 hours.   VAS [name] LOWER EXTREMITY VENOUS (DVT)  Result Date: 01/19/2023  Lower Venous DVT Study Patient Name:  Amanda [name]  Date of Exam:   01/19/2023 Medical Rec #: [id number]            Accession #:    [id number] Date of Birth: 1945-05-01           Patient Gender: F Patient Age:   61 years Exam Location:  [facility] Procedure:      VAS [name] LOWER EXTREMITY VENOUS (DVT) Referring Phys: A POWELL JR --------------------------------------------------------------------------------  Indications: Swelling.  Performing  Technologist: Marilynne Halsted RDMS, RVT  Examination Guidelines: A complete evaluation includes B-mode imaging, spectral Doppler, color Doppler, and power Doppler as needed of all accessible portions of each vessel. Bilateral testing is considered an integral part of a complete examination. Limited examinations for reoccurring indications may be performed as noted. The reflux portion of the exam is performed with the patient in reverse Trendelenburg.  +-----+---------------+---------+-----------+----------+--------------+ RIGHTCompressibilityPhasicitySpontaneityPropertiesThrombus Aging +-----+---------------+---------+-----------+----------+--------------+ CFV  Full           Yes      Yes                                 +-----+---------------+---------+-----------+----------+--------------+ SFJ  Full                                                        +-----+---------------+---------+-----------+----------+--------------+   +---------+---------------+---------+-----------+----------+--------------+ LEFT     CompressibilityPhasicitySpontaneityPropertiesThrombus Aging +---------+---------------+---------+-----------+----------+--------------+ CFV  None           No       No                   Acute          +---------+---------------+---------+-----------+----------+--------------+ SFJ      None                                         Acute          +---------+---------------+---------+-----------+----------+--------------+ FV Prox  None                                         Acute          +---------+---------------+---------+-----------+----------+--------------+ FV Mid   None           No       No                   Acute          +---------+---------------+---------+-----------+----------+--------------+ FV DistalNone                                         Acute          +---------+---------------+---------+-----------+----------+--------------+  PFV      None                                         Acute          +---------+---------------+---------+-----------+----------+--------------+ POP      None           No       No                   Acute          +---------+---------------+---------+-----------+----------+--------------+ PTV      None                                         Acute          +---------+---------------+---------+-----------+----------+--------------+ PERO     None                                         Acute          +---------+---------------+---------+-----------+----------+--------------+ Unable to evaluate iliac veins.    Summary: RIGHT: - No evidence of common femoral vein obstruction.   LEFT: - Findings consistent with acute deep vein thrombosis involving the left common femoral vein, SF junction, left femoral vein, left proximal profunda vein, left popliteal vein, left posterior tibial veins, and left peroneal veins.   *See table(s) above for measurements and observations.    Preliminary    CT ABDOMEN PELVIS WO CONTRAST  Result Date: 01/19/2023 CLINICAL DATA:  Sepsis, altered mental status. Sacral decubitus ulcer. EXAM: CT ABDOMEN AND PELVIS WITHOUT CONTRAST TECHNIQUE: Multidetector CT imaging of the abdomen and pelvis  was performed following the standard protocol without IV contrast. RADIATION DOSE REDUCTION: This exam was performed according to the departmental dose-optimization program which includes automated exposure control, adjustment of the mA and/or kV according to patient size and/or use of iterative reconstruction technique. COMPARISON:  None Available. FINDINGS: Lower chest: Coronary artery and aortic calcifications. No acute abnormality. Hepatobiliary: Several layering gallstones within the gallbladder. No biliary ductal dilatation or focal hepatic abnormality. Pancreas: No focal abnormality or ductal dilatation. Spleen: No focal abnormality.  Normal size. Adrenals/Urinary Tract:  Small amount of air within the urinary bladder, presumably from recent catheterization. Recommend clinical correlation. No renal or adrenal mass. No stones or hydronephrosis. Stomach/Bowel: Large stool burden throughout the colon. Scattered colonic diverticula. No active diverticulitis. Stomach and small bowel decompressed. No bowel obstruction. Vascular/Lymphatic: Aortoiliac atherosclerosis. No evidence of aneurysm or adenopathy. Reproductive: Prior hysterectomy.  No adnexal masses. Other: No free fluid or free air. Musculoskeletal: No acute bony abnormality. No bone destruction to suggest osteomyelitis within the pelvis. No soft tissue fluid collection or air. IMPRESSION: Small amount air within the urinary bladder, presumably from recent catheterization. Recommend clinical correlation. Large stool burden throughout the colon. Aortic atherosclerosis.  Coronary artery disease. Cholelithiasis. Electronically Signed   By: Charlett Nose M.D.   On: 01/19/2023 00:12   CT MAXILLOFACIAL WO CONTRAST  Result Date: 01/19/2023 CLINICAL DATA:  Question dental abscess left lower region. EXAM: CT MAXILLOFACIAL WITHOUT CONTRAST TECHNIQUE: Multidetector CT imaging of the maxillofacial structures was performed. Multiplanar CT image reconstructions were also generated. RADIATION DOSE REDUCTION: This exam was performed according to the departmental dose-optimization program which includes automated exposure control, adjustment of the mA and/or kV according to patient size and/or use of iterative reconstruction technique. COMPARISON:  None Available. FINDINGS: Osseous: No fracture or mandibular dislocation. No destructive process. No visible periapical abscess. Orbits: Negative. No traumatic or inflammatory finding. Sinuses: Clear Soft tissues: No fluid collection to suggest abscess. In particular, in the region of the left mandible or submandibular region. Limited intracranial: No significant or unexpected finding. IMPRESSION: No  facial or orbital fracture. No evidence of dental or soft tissue abscess. Electronically Signed   By: Charlett Nose M.D.   On: 01/19/2023 00:09   CT HEAD WO CONTRAST ( )  Result Date: 01/18/2023 CLINICAL DATA:  Head trauma mental status change EXAM: CT HEAD WITHOUT CONTRAST TECHNIQUE: Contiguous axial images were obtained from the base of the skull through the vertex without intravenous contrast. RADIATION DOSE REDUCTION: This exam was performed according to the departmental dose-optimization program which includes automated exposure control, adjustment of the mA and/or kV according to patient size and/or use of iterative reconstruction technique. COMPARISON:  CT brain 10/08/2022 FINDINGS: Brain: No acute territorial infarction, hemorrhage, or intracranial mass. Moderate atrophy. Mild chronic small vessel ischemic changes of the white matter. Prominent ventricles felt secondary to atrophy. Vascular: No hyperdense vessels.  Carotid vascular calcification Skull: Normal. Negative for fracture or focal lesion. Sinuses/Orbits: No acute finding. Other: None IMPRESSION: 1. No CT evidence for acute intracranial abnormality. 2. Atrophy and chronic small vessel ischemic changes of the white matter. Electronically Signed   By: Jasmine Pang M.D.   On: 01/18/2023 20:34   DG Chest Portable 1 View  Result Date: 01/18/2023 CLINICAL DATA:  Altered mental status hypertension EXAM: PORTABLE CHEST 1 VIEW COMPARISON:  10/13/2022 FINDINGS: Multiple skin fold artifact over the right chest. No acute airspace disease or effusion. Normal cardiac size. No pneumothorax IMPRESSION: No active disease. Electronically Signed   By:  Jasmine Pang M.D.   On: 01/18/2023 20:31    Pending Labs Unresulted Labs (From admission, onward)     Start     Ordered   01/20/23 0500  CBC with Differential/Platelet  Tomorrow morning,   R        01/19/23 0950   01/20/23 0500  Comprehensive metabolic panel  Tomorrow morning,   R        01/19/23 0950    01/20/23 0500  Magnesium  Tomorrow morning,   R        01/19/23 0950   01/20/23 0500  Phosphorus  Tomorrow morning,   R        01/19/23 0950   01/19/23 0941  Lipase, blood  Add-on,   AD        01/19/23 0940   01/19/23 0359  MRSA Next Gen by PCR, Nasal  (MRSA Screening)  Once,   R        01/19/23 0359   01/19/23 0100  Basic metabolic panel  Now then every 6 hours,   R (with TIMED occurrences)      01/18/23 2213   01/18/23 1806  Urine Culture  Once,   URGENT       Question:  Indication  Answer:  Altered mental status (if no other cause identified)   01/18/23 1806            Vitals/Pain Today's Vitals   01/19/23 1405 01/19/23 1406 01/19/23 1445 01/19/23 1505  BP: 117/65  111/62   Pulse: (!) 122  (!) 109   Resp: (!) 30  (!) 27   Temp:  99.3 F (37.4 C)  99.1 F (37.3 C)  TempSrc:  Oral  Rectal  SpO2: 100%  96%   Weight:        Isolation Precautions Droplet precaution  Medications Medications  dextrose 5 % solution ( Intravenous New Bag/Given 01/19/23 1404)  insulin aspart (novoLOG) injection 0-9 Units ( Subcutaneous Not Given 01/19/23 1221)  heparin injection 5,000 Units (5,000 Units Subcutaneous Given 01/19/23 1500)  acetaminophen (TYLENOL) tablet 650 mg (has no administration in time range)    Or  acetaminophen (TYLENOL) suppository 650 mg (has no administration in time range)  ondansetron (ZOFRAN) tablet 4 mg (has no administration in time range)    Or  ondansetron (ZOFRAN) injection 4 mg (has no administration in time range)  silver sulfADIAZINE (SILVADENE) 1 % cream 1 Application (1 Application Topical Given 01/19/23 1142)  vancomycin variable dose per unstable renal function (pharmacist dosing) (has no administration in time range)  thiamine (VITAMIN B1) 500 mg in sodium chloride 0.9 % 50 mL IVPB (500 mg Intravenous New Bag/Given 01/19/23 1504)    Followed by  thiamine (VITAMIN B1) 250 mg in sodium chloride 0.9 % 50 mL IVPB (has no administration in time range)     Followed by  thiamine (VITAMIN B1) injection 100 mg (has no administration in time range)  cefTRIAXone (ROCEPHIN) 2 g in sodium chloride 0.9 % 100 mL IVPB (0 g Intravenous Stopped 01/19/23 1125)  lactated ringers bolus 1,000 mL (0 mLs Intravenous Stopped 01/18/23 2010)  ceFEPIme (MAXIPIME) 2 g in sodium chloride 0.9 % 100 mL IVPB (0 g Intravenous Stopped 01/18/23 1911)  vancomycin (VANCOREADY) IVPB 1250 mg/250 mL (0 mg Intravenous Stopped 01/18/23 2122)  lactated ringers bolus 500 mL (0 mLs Intravenous Stopped 01/18/23 2122)    Mobility non-ambulatory     Focused Assessments Neuro Assessment Handoff:  Swallow screen pass?  N/a  Last date known well: 01/17/23 Last time known well:  ("bedtime last night") Neuro Assessment: Exceptions to WDL Neuro Checks:      Has TPA been given? No If patient is a Neuro Trauma and patient is going to OR before floor call report to 4N Charge nurse: [phone] or [phone]   R Recommendations: See Admitting Provider Note  Report given to:   Additional Notes:

## 2023-01-19 NOTE — Consult Note (Signed)
WOC Nurse Consult Note: Reason for Consult: sacral pressure injury Wound type: Deep Tissue Pressure Injury; in evolution  Pressure Injury POA: Yes Measurement:7cm x 7cm x 0.1cm (this included the area of deep tissue injury) with the partial thickness scattered skin loss Wound bed: dark purple non-blanchable, pink, moist in sites where there is skin loss  Drainage (amount, consistency, odor) serosanguinous Periwound:intact  Dressing procedure/placement/frequency: Air mattress for moisture management and pressure redistribution, requested to be ordered in the ED due to possible delay in inpatient admission Cleanse wound with saline, pat dry Apply single layer of xeroform gauze, top with foam. Change every day.   Discussed POC with patient's husband and bedside nurse.  Re consult if needed, will not follow at this time. Thanks  Geraldin Haberm[employer]l [employer], RN,CWOCN, CNS, CWON-AP [phone])

## 2023-01-19 NOTE — ED Notes (Signed)
IV team at bedside 

## 2023-01-19 NOTE — ED Notes (Signed)
Patients soiled linens removed and replaced with clean sheets and gown.

## 2023-01-19 NOTE — Progress Notes (Addendum)
PHARMACY - ANTICOAGULATION CONSULT NOTE  Pharmacy Consult for heparin Indication: DVT  No Known Allergies  Patient Measurements: Weight: 58.1 kg (128 lb) Heparin Dosing Weight: 58.1kg  Vital Signs: Temp: 99.3 F (37.4 C) (10/02 1406) Temp Source: Oral (10/02 1406) BP: 117/65 (10/02 1405) Pulse Rate: 122 (10/02 1405)  Labs: Recent Labs    01/18/23 1848 01/18/23 2042 01/19/23 0103 01/19/23 0715  HGB 13.2  --   --  10.5*  HCT 44.2  --   --  36.0  PLT 209  --   --  162  CREATININE 2.36*  --  1.71* 1.76*  TROPONINIHS 32* 25*  --   --     Estimated Creatinine Clearance: 24.1 mL/min (A) (by C-G formula based on SCr of 1.76 mg/dL (H)).   Medical History: Past Medical History:  Diagnosis Date   Dementia (HCC)    Dyslipidemia     Assessment: 76YOF with PMH of dementia, HLD presented with DVT. Pharmacy consulted to dose heparin.  Hgb 13.5>10.5, Plt 209>162, decreased, likely dilutional  Patient received subcutaneous heparin dose just prior to heparin consult.  Goal of Therapy:  Heparin level 0.3-0.7 units/ml Monitor platelets by anticoagulation protocol: Yes   Plan:  Give 3500 units heparin bolus x 1 Start heparin infusion at 900 units/hr Check anti-Xa level in 8 hours and daily while on heparin Continue to monitor H&H and platelets   Stephenie Acres, PharmD PGY1 Pharmacy Resident 01/19/2023 2:45 PM

## 2023-01-19 NOTE — Progress Notes (Signed)
Left lower ext venous  has been completed. Refer to [facility] under chart review to view preliminary results. Results given to patient's nurse.  01/19/2023  2:00 PM Bennet Kujawa, Gerarda Gunther

## 2023-01-19 NOTE — Progress Notes (Addendum)
PROGRESS NOTE    Amanda [name]  XBJ:[id number] DOB: 1945/08/24 DOA: 01/18/2023 PCP: [name], MD  Chief Complaint  Patient presents with   Altered Mental Status   Hypertension    Brief Narrative:   Amanda [name] is Amanda [name] 77 y.o. female with medical history significant of moderate dementia.   Pt with 24h of progressively worsening AMS, now poorly responsive with GCS 7   BGL okay   H/o PNA and UTI causing sepsis back in June.   Family reports decreased PO intake recently "can't get her to eat anything".  They wonder about dental abscess.   Also has known decubitus ulcer.  Assessment & Plan:   Principal Problem:   Acute metabolic encephalopathy Active Problems:   AKI (acute kidney injury) (HCC)   Hypernatremia   SIRS (systemic inflammatory response syndrome) (HCC)   Decubitus ulcer   Hypertension   Moderate dementia (HCC)  Goals of Care Patient appears to be significantly ill.  Has chronic dementia.  Hypernatremia and poor PO intake.  Prognosis is likely poor, but collateral from family will help with establishing baseline.  Haven't been able to reach at this point, will need to discuss goals of care.  Acute metabolic encephalopathy Dementia  Addendum: Dementia for years.  at baseline, wheelchair bound (walked 80 ft with walker with assistance in early September at rehab - wheelchair bound since being home), she's disoriented typically, but will communicate.   Likely multifactorial (hypernatremia, uremia/AKI, dementia) but inciting cause unclear.  Not able to discuss with family yet today to discuss her baseline and timing for this decline. No clear infectious cause (CXR without active disease, CT maxillofacial without notable findings, CT abd/pelvis without acute findings, decub doesn't appear infected) TSH wnl, ammonia wnl  CT head without acute abnormality Treating hypernatremia/AKI Consider additional workup/imaging   SIRS (systemic inflammatory  response syndrome) (HCC) Initial tachycardia, WBC 17k.  Question of SIRS / Sepsis with unclear source: UA neg CXR neg but may just be due to her being so dehydrated No obvious dental infection on exam, but does have dental cary of posterior molar on LL side. Decubitus ulcer is at least stage 2, but no obvious erythema / drainage S/p cefepime and vanc in ED Check procalcitonin CT maxilofacial to r/o dental abscess without acute findings CT abd pelvis with large stool burden, air within bladder Repeat CXR 10/3 Blood cultures pending Covid and RVP negative Will continue  ceftriaxone empirically for now, broaden as indicated    Hypernatremia Severe dehydration due to decreased PO intake. S/p 1.5L LR bolus in ED for question of sepsis D5W at 75 cc/hr for hypernatremia Avoid overcorrection    AKI (acute kidney injury) (HCC) likely pre-renal with hx poor PO intake Baseline creatinine 0.69 S/p 1.5 L bous  Continue D5 fluids  CT without hydro    Elevated Lipase Unclear significance - CT without findings c/w pancreatitis, not significantly tender on exam Repeat   Decubitus ulcer Wound care consult No obvious erythema, drainage, crepitus.   Hypertension Chart diagnosis, doesn't appear to be on any chronic meds for this.     DVT prophylaxis: heparin Code Status: full Family Communication: called all listed contacts, no answer Disposition:   Status is: Inpatient Remains inpatient appropriate because: pending improvement   Consultants:  none  Procedures:  none  Antimicrobials:  Anti-infectives (From admission, onward)    Start     Dose/Rate Route Frequency Ordered Stop   01/19/23 1900  ceFEPIme (MAXIPIME) 2 g in  sodium chloride 0.9 % 100 mL IVPB        2 g 200 mL/hr over 30 Minutes Intravenous Every 24 hours 01/19/23 0338     01/19/23 0342  vancomycin variable dose per unstable renal function (pharmacist dosing)         Does not apply See admin instructions 01/19/23  0343     01/18/23 1830  vancomycin (VANCOREADY) IVPB 1250 mg/250 mL        1,250 mg 166.7 mL/hr over 90 Minutes Intravenous  Once 01/18/23 1818 01/18/23 2122   01/18/23 1815  ceFEPIme (MAXIPIME) 2 g in sodium chloride 0.9 % 100 mL IVPB        2 g 200 mL/hr over 30 Minutes Intravenous  Once 01/18/23 1807 01/18/23 1911       Subjective: Nonverbal Called husband, daughter, son without answer  Objective: Vitals:   01/19/23 0615 01/19/23 0630 01/19/23 0645 01/19/23 0715  BP: 127/60 107/69 104/81 103/70  Pulse: 86 93 92 91  Resp: (!) 22 16 17  (!) 25  Temp:      TempSrc:      SpO2: 100% 100% 100% 100%  Weight:        Intake/Output Summary (Last 24 hours) at 01/19/2023 0846 Last data filed at 01/18/2023 2122 Gross per 24 hour  Intake 1850 ml  Output --  Net 1850 ml   Filed Weights   01/19/23 0315  Weight: 58.1 kg    Examination:  General exam: frail, chronically ill appearing Respiratory system: unlabored Cardiovascular system: RRR Gastrointestinal system: Abdomen is nondistended, soft and nontender.  Stage 2 decubitus that does not appear infected Central nervous system: nonverbal, moans and resists when being turned to examine her decubitus Extremities: left leg edema   Data Reviewed: I have personally reviewed following labs and imaging studies  CBC: Recent Labs  Lab 01/18/23 1848 01/19/23 0715  WBC 17.8* 16.3*  NEUTROABS 15.3*  --   HGB 13.2 10.5*  HCT 44.2 36.0  MCV 86.3 87.0  PLT 209 162    Basic Metabolic Panel: Recent Labs  Lab 01/18/23 1848 01/19/23 0103 01/19/23 0715  NA 157* 156* 153*  K 4.6 4.3 3.8  CL 115* 118* 115*  CO2 25 26 24   GLUCOSE 149* 123* 168*  BUN 138* 131* 126*  CREATININE 2.36* 1.71* 1.76*  CALCIUM 9.6 8.9 8.7*    GFR: Estimated Creatinine Clearance: 24.1 mL/min (Lorilee Cafarella) (by C-G formula based on SCr of 1.76 mg/dL (H)).  Liver Function Tests: Recent Labs  Lab 01/18/23 1848  AST 48*  ALT 30  ALKPHOS 64  BILITOT 0.4   PROT 8.4*  ALBUMIN 3.3*    CBG: Recent Labs  Lab 01/18/23 1751 01/19/23 0007 01/19/23 0357 01/19/23 0806  GLUCAP 116* 72 117* 100*     Recent Results (from the past 240 hour(s))  Blood culture (routine x 2)     Status: None (Preliminary result)   Collection Time: 01/18/23  6:48 PM   Specimen: BLOOD RIGHT ARM  Result Value Ref Range Status   Specimen Description BLOOD RIGHT ARM  Final   Special Requests   Final    BOTTLES DRAWN AEROBIC AND ANAEROBIC Blood Culture adequate volume   Culture   Final    NO GROWTH < 12 HOURS Performed at [facility], [address] [place], [place] [address]    Report Status PENDING  Incomplete  Blood culture (routine x 2)     Status: None (Preliminary result)   Collection  Time: 01/18/23  6:49 PM   Specimen: BLOOD RIGHT ARM  Result Value Ref Range Status   Specimen Description BLOOD RIGHT ARM  Final   Special Requests   Final    BOTTLES DRAWN AEROBIC AND ANAEROBIC Blood Culture adequate volume   Culture   Final    NO GROWTH < 12 HOURS Performed at [facility], [address] [place], [place] [address]    Report Status PENDING  Incomplete  SARS Coronavirus 2 by RT PCR (hospital order, performed in [facility] hospital lab) *cepheid single result test* Anterior Nasal Swab     Status: None   Collection Time: 01/18/23 11:09 PM   Specimen: Anterior Nasal Swab  Result Value Ref Range Status   SARS Coronavirus 2 by RT PCR NEGATIVE NEGATIVE Final    Comment: Performed at [facility], [address], [place], [place] [address]  Respiratory (~20 pathogens) panel by PCR     Status: None   Collection Time: 01/18/23 11:09 PM   Specimen: Anterior Nasal Swab; Respiratory  Result Value Ref Range Status   Adenovirus NOT DETECTED NOT DETECTED Final   Coronavirus 229E NOT DETECTED NOT DETECTED Final    Comment: (NOTE) The Coronavirus on the Respiratory Panel, DOES NOT test for the novel  Coronavirus (2019 nCoV)    Coronavirus  HKU1 NOT DETECTED NOT DETECTED Final   Coronavirus NL63 NOT DETECTED NOT DETECTED Final   Coronavirus OC43 NOT DETECTED NOT DETECTED Final   Metapneumovirus NOT DETECTED NOT DETECTED Final   Rhinovirus / Enterovirus NOT DETECTED NOT DETECTED Final   Influenza [place] NOT DETECTED NOT DETECTED Final   Influenza B NOT DETECTED NOT DETECTED Final   Parainfluenza Virus 1 NOT DETECTED NOT DETECTED Final   Parainfluenza Virus 2 NOT DETECTED NOT DETECTED Final   Parainfluenza Virus 3 NOT DETECTED NOT DETECTED Final   Parainfluenza Virus 4 NOT DETECTED NOT DETECTED Final   Respiratory Syncytial Virus NOT DETECTED NOT DETECTED Final   Bordetella pertussis NOT DETECTED NOT DETECTED Final   Bordetella Parapertussis NOT DETECTED NOT DETECTED Final   Chlamydophila pneumoniae NOT DETECTED NOT DETECTED Final   Mycoplasma pneumoniae NOT DETECTED NOT DETECTED Final    Comment: Performed at [facility], [address] [place], [place] [address]         Radiology Studies: CT ABDOMEN PELVIS WO CONTRAST  Result Date: 01/19/2023 CLINICAL DATA:  Sepsis, altered mental status. Sacral decubitus ulcer. EXAM: CT ABDOMEN AND PELVIS WITHOUT CONTRAST TECHNIQUE: Multidetector CT imaging of the abdomen and pelvis was performed following the standard protocol without IV contrast. RADIATION DOSE REDUCTION: This exam was performed according to the departmental dose-optimization program which includes automated exposure control, adjustment of the mA and/or kV according to patient size and/or use of iterative reconstruction technique. COMPARISON:  None Available. FINDINGS: Lower chest: Coronary artery and aortic calcifications. No acute abnormality. Hepatobiliary: Several layering gallstones within the gallbladder. No biliary ductal dilatation or focal hepatic abnormality. Pancreas: No focal abnormality or ductal dilatation. Spleen: No focal abnormality.  Normal size. Adrenals/Urinary Tract: Small amount of air within the  urinary bladder, presumably from recent catheterization. Recommend clinical correlation. No renal or adrenal mass. No stones or hydronephrosis. Stomach/Bowel: Large stool burden throughout the colon. Scattered colonic diverticula. No active diverticulitis. Stomach and small bowel decompressed. No bowel obstruction. Vascular/Lymphatic: Aortoiliac atherosclerosis. No evidence of aneurysm or adenopathy. Reproductive: Prior hysterectomy.  No adnexal masses. Other: No free fluid or free air. Musculoskeletal: No acute  bony abnormality. No bone destruction to suggest osteomyelitis within the pelvis. No soft tissue fluid collection or air. IMPRESSION: Small amount air within the urinary bladder, presumably from recent catheterization. Recommend clinical correlation. Large stool burden throughout the colon. Aortic atherosclerosis.  Coronary artery disease. Cholelithiasis. Electronically Signed   By: Charlett Nose M.D.   On: 01/19/2023 00:12   CT MAXILLOFACIAL WO CONTRAST  Result Date: 01/19/2023 CLINICAL DATA:  Question dental abscess left lower region. EXAM: CT MAXILLOFACIAL WITHOUT CONTRAST TECHNIQUE: Multidetector CT imaging of the maxillofacial structures was performed. Multiplanar CT image reconstructions were also generated. RADIATION DOSE REDUCTION: This exam was performed according to the departmental dose-optimization program which includes automated exposure control, adjustment of the mA and/or kV according to patient size and/or use of iterative reconstruction technique. COMPARISON:  None Available. FINDINGS: Osseous: No fracture or mandibular dislocation. No destructive process. No visible periapical abscess. Orbits: Negative. No traumatic or inflammatory finding. Sinuses: Clear Soft tissues: No fluid collection to suggest abscess. In particular, in the region of the left mandible or submandibular region. Limited intracranial: No significant or unexpected finding. IMPRESSION: No facial or orbital fracture. No  evidence of dental or soft tissue abscess. Electronically Signed   By: Charlett Nose M.D.   On: 01/19/2023 00:09   CT HEAD WO CONTRAST ( )  Result Date: 01/18/2023 CLINICAL DATA:  Head trauma mental status change EXAM: CT HEAD WITHOUT CONTRAST TECHNIQUE: Contiguous axial images were obtained from the base of the skull through the vertex without intravenous contrast. RADIATION DOSE REDUCTION: This exam was performed according to the departmental dose-optimization program which includes automated exposure control, adjustment of the mA and/or kV according to patient size and/or use of iterative reconstruction technique. COMPARISON:  CT brain 10/08/2022 FINDINGS: Brain: No acute territorial infarction, hemorrhage, or intracranial mass. Moderate atrophy. Mild chronic small vessel ischemic changes of the white matter. Prominent ventricles felt secondary to atrophy. Vascular: No hyperdense vessels.  Carotid vascular calcification Skull: Normal. Negative for fracture or focal lesion. Sinuses/Orbits: No acute finding. Other: None IMPRESSION: 1. No CT evidence for acute intracranial abnormality. 2. Atrophy and chronic small vessel ischemic changes of the white matter. Electronically Signed   By: Jasmine Pang M.D.   On: 01/18/2023 20:34   DG Chest Portable 1 View  Result Date: 01/18/2023 CLINICAL DATA:  Altered mental status hypertension EXAM: PORTABLE CHEST 1 VIEW COMPARISON:  10/13/2022 FINDINGS: Multiple skin fold artifact over the right chest. No acute airspace disease or effusion. Normal cardiac size. No pneumothorax IMPRESSION: No active disease. Electronically Signed   By: Jasmine Pang M.D.   On: 01/18/2023 20:31        Scheduled Meds:  heparin  5,000 Units Subcutaneous Q8H   insulin aspart  0-9 Units Subcutaneous Q4H   silver sulfADIAZINE  1 Application Topical Daily   vancomycin variable dose per unstable renal function (pharmacist dosing)   Does not apply See admin instructions   Continuous  Infusions:  ceFEPime (MAXIPIME) IV     dextrose 75 mL/hr at 01/19/23 0703     LOS: 1 day    Time spent: over 30 min    Lacretia Nicks, MD [facility]   To contact the attending provider between 7A-7P or the covering provider during after hours 7P-7A, please log into the web site [email or web address] and access using universal [place] password for that web site. If you do not have the password, please call the hospital operator.  01/19/2023, 8:46 AM

## 2023-01-19 NOTE — ED Notes (Signed)
Vascular ultra sound at bedside.

## 2023-01-19 NOTE — Progress Notes (Signed)
Pharmacy Antibiotic Note  Amanda [name] is a 77 y.o. female admitted on 01/18/2023 with sepsis. Pharmacy has been consulted for cefepime and vancomycin dosing.  Plan: Cefepime 2g q24h Vancomycin dose per levels with unstable renal function F/u renal function, micro data, and narrow as able Vancomycin levels as indicated     Temp (24hrs), Avg:96.6 F (35.9 C), Min:96.1 F (35.6 C), Max:97.1 F (36.2 C)  Recent Labs  Lab 01/18/23 1848 01/18/23 2042 01/19/23 0103  WBC 17.8*  --   --   CREATININE 2.36*  --  1.71*  LATICACIDVEN 3.2* 2.6*  --     CrCl cannot be calculated (Unknown ideal weight.).    No Known Allergies  Antimicrobials this admission: Cefepime 10/1 > Vancomycin 10/1 >  Microbiology results: 10/1 Bcx: pending 10/1 Ucx: pending  10/1 Covid: negative 10/1 resp panel: negative  Thank you for allowing pharmacy to be a part of this patient's care.  Marja Kays 01/19/2023 3:32 AM

## 2023-01-20 ENCOUNTER — Inpatient Hospital Stay (HOSPITAL_COMMUNITY): Payer: PPO

## 2023-01-20 DIAGNOSIS — G9341 Metabolic encephalopathy: Secondary | ICD-10-CM | POA: Diagnosis not present

## 2023-01-20 DIAGNOSIS — I700 Atherosclerosis of aorta: Secondary | ICD-10-CM | POA: Diagnosis not present

## 2023-01-20 LAB — CBC WITH DIFFERENTIAL/PLATELET
Abs Immature Granulocytes: 0.3 10*3/uL — ABNORMAL HIGH (ref 0.00–0.07)
Basophils Absolute: 0 10*3/uL (ref 0.0–0.1)
Basophils Relative: 0 %
Eosinophils Absolute: 0 10*3/uL (ref 0.0–0.5)
Eosinophils Relative: 0 %
HCT: 34.4 % — ABNORMAL LOW (ref 36.0–46.0)
Hemoglobin: 10.4 g/dL — ABNORMAL LOW (ref 12.0–15.0)
Immature Granulocytes: 2 %
Lymphocytes Relative: 11 %
Lymphs Abs: 1.4 10*3/uL (ref 0.7–4.0)
MCH: 25.4 pg — ABNORMAL LOW (ref 26.0–34.0)
MCHC: 30.2 g/dL (ref 30.0–36.0)
MCV: 84.1 fL (ref 80.0–100.0)
Monocytes Absolute: 1.3 10*3/uL — ABNORMAL HIGH (ref 0.1–1.0)
Monocytes Relative: 10 %
Neutro Abs: 10.4 10*3/uL — ABNORMAL HIGH (ref 1.7–7.7)
Neutrophils Relative %: 77 %
Platelets: 170 10*3/uL (ref 150–400)
RBC: 4.09 MIL/uL (ref 3.87–5.11)
RDW: 15.8 % — ABNORMAL HIGH (ref 11.5–15.5)
WBC: 13.5 10*3/uL — ABNORMAL HIGH (ref 4.0–10.5)
nRBC: 0.1 % (ref 0.0–0.2)

## 2023-01-20 LAB — COMPREHENSIVE METABOLIC PANEL
ALT: 26 U/L (ref 0–44)
AST: 38 U/L (ref 15–41)
Albumin: 2.5 g/dL — ABNORMAL LOW (ref 3.5–5.0)
Alkaline Phosphatase: 47 U/L (ref 38–126)
Anion gap: 15 (ref 5–15)
BUN: 93 mg/dL — ABNORMAL HIGH (ref 8–23)
CO2: 23 mmol/L (ref 22–32)
Calcium: 8.3 mg/dL — ABNORMAL LOW (ref 8.9–10.3)
Chloride: 111 mmol/L (ref 98–111)
Creatinine, Ser: 1.35 mg/dL — ABNORMAL HIGH (ref 0.44–1.00)
GFR, Estimated: 40 mL/min — ABNORMAL LOW (ref >60)
Glucose, Bld: 155 mg/dL — ABNORMAL HIGH (ref 70–99)
Potassium: 3.3 mmol/L — ABNORMAL LOW (ref 3.5–5.1)
Sodium: 149 mmol/L — ABNORMAL HIGH (ref 135–145)
Total Bilirubin: 0.6 mg/dL (ref 0.3–1.2)
Total Protein: 6.6 g/dL (ref 6.5–8.1)

## 2023-01-20 LAB — BASIC METABOLIC PANEL
Anion gap: 18 — ABNORMAL HIGH (ref 5–15)
BUN: 59 mg/dL — ABNORMAL HIGH (ref 8–23)
CO2: 21 mmol/L — ABNORMAL LOW (ref 22–32)
Calcium: 8.6 mg/dL — ABNORMAL LOW (ref 8.9–10.3)
Chloride: 109 mmol/L (ref 98–111)
Creatinine, Ser: 1.05 mg/dL — ABNORMAL HIGH (ref 0.44–1.00)
GFR, Estimated: 55 mL/min — ABNORMAL LOW (ref >60)
Glucose, Bld: 100 mg/dL — ABNORMAL HIGH (ref 70–99)
Potassium: 4 mmol/L (ref 3.5–5.1)
Sodium: 148 mmol/L — ABNORMAL HIGH (ref 135–145)

## 2023-01-20 LAB — GLUCOSE, CAPILLARY
Glucose-Capillary: 155 mg/dL — ABNORMAL HIGH (ref 70–99)
Glucose-Capillary: 130 mg/dL — ABNORMAL HIGH (ref 70–99)
Glucose-Capillary: 107 mg/dL — ABNORMAL HIGH (ref 70–99)
Glucose-Capillary: 94 mg/dL (ref 70–99)
Glucose-Capillary: 103 mg/dL — ABNORMAL HIGH (ref 70–99)

## 2023-01-20 LAB — URINE CULTURE: Culture: NO GROWTH

## 2023-01-20 LAB — LIPASE, BLOOD: Lipase: 261 U/L — ABNORMAL HIGH (ref 11–51)

## 2023-01-20 LAB — HEPARIN LEVEL (UNFRACTIONATED)
Heparin Unfractionated: >1.1 [IU]/mL — ABNORMAL HIGH (ref 0.30–0.70)
Heparin Unfractionated: >1.1 [IU]/mL — ABNORMAL HIGH (ref 0.30–0.70)

## 2023-01-20 LAB — MAGNESIUM: Magnesium: 2.6 mg/dL — ABNORMAL HIGH (ref 1.7–2.4)

## 2023-01-20 LAB — PHOSPHORUS: Phosphorus: 2.2 mg/dL — ABNORMAL LOW (ref 2.5–4.6)

## 2023-01-20 MED ORDER — HEPARIN (PORCINE) 25000 UT/250ML-% IV SOLN
600.0000 [IU]/h | INTRAVENOUS | Status: DC
  Administered 2023-01-20: 600 [IU]/h via INTRAVENOUS
  Filled 2023-01-20: qty 250

## 2023-01-20 MED ORDER — POTASSIUM CL IN DEXTROSE 5% 20 MEQ/L IV SOLN
20.0000 meq | INTRAVENOUS | Status: DC
  Administered 2023-01-20: 20 meq via INTRAVENOUS
  Filled 2023-01-20 (×2): qty 1000

## 2023-01-20 MED ORDER — HEPARIN (PORCINE) 25000 UT/250ML-% IV SOLN
750.0000 [IU]/h | INTRAVENOUS | Status: DC
  Administered 2023-01-20: 750 [IU]/h via INTRAVENOUS

## 2023-01-20 MED ORDER — DEXTROSE 5 % IV SOLN: INTRAVENOUS | Status: DC

## 2023-01-20 NOTE — Progress Notes (Addendum)
PROGRESS NOTE    Amanda [name]  ZOX:[id number] DOB: October 10, 1945 DOA: 01/18/2023 PCP: [name], MD  Chief Complaint  Patient presents with   Altered Mental Status   Hypertension    Brief Narrative:   Amanda [name] is Amanda [name] 77 y.o. female with medical history significant of moderate dementia who presents with progressive AMS and poor PO intake.    She's been admitted with AKI, hypernatremia, and AMS.   Assessment & Plan:   Principal Problem:   Acute metabolic encephalopathy Active Problems:   AKI (acute kidney injury) (HCC)   Hypernatremia   SIRS (systemic inflammatory response syndrome) (HCC)   Decubitus ulcer   Hypertension   Moderate dementia (HCC)  Goals of Care Patient appears to be significantly ill.  Has chronic dementia.  Hypernatremia and poor PO intake.  If patient's AMS does not improve to allow safe PO intake, prognosis is likely poor.  Discussed with husband 10/2, he wants full code at this time.  Discussed additionally today, I recommended involvement of palliative care to help with continued goc conversations.     Acute metabolic encephalopathy Dementia  Dementia for years.  at baseline, wheelchair bound (walked 80 ft with walker with assistance in early September at rehab - wheelchair bound since being home), she's disoriented typically, but will communicate.   Likely multifactorial (hypernatremia, uremia/AKI, dementia) but inciting cause unclear.  Not able to discuss with family yet today to discuss her baseline and timing for this decline. No clear infectious cause (CXR without active disease, CT maxillofacial without notable findings, CT abd/pelvis without acute findings, decub doesn't appear infected) TSH wnl, ammonia wnl  CT head without acute abnormality Treating hypernatremia/AKI Consider additional workup/imaging - mildly improved today   SIRS (systemic inflammatory response syndrome) (HCC) Initial tachycardia, WBC 17k.  Question of SIRS  / Sepsis with unclear source: UA neg No obvious dental infection on my exam Decubitus ulcer is at least stage 2, but no obvious erythema / drainage S/p cefepime and vanc in ED Check procalcitonin CT maxilofacial to r/o dental abscess -> without acute findings CT abd pelvis with large stool burden, air within bladder Repeat CXR 10/3 -> without acute cardiopulm dz  Blood cultures pending Covid and RVP negative Will continue ceftriaxone empirically for now, broaden as indicated    Dysphagia Not safe for PO at this time given AMS - daughter asked about TPN -> discussed we'd prefer enteral nutrition since no contraindication, if artificial nutrition pursued, would probably recommend tube feeds via cortrak (would need to discuss whether time limited trial).  My preference would be to avoid feeding tube with her history, will discuss additionally.      Hypernatremia Severe dehydration due to decreased PO intake. S/p 1.5L LR bolus in ED for question of sepsis D5W at 100 cc/hr for hypernatremia Avoid overcorrection    AKI  likely pre-renal with hx poor PO intake Baseline creatinine 0.69 Improving with IVF CT without hydro    DVT Heparin gtt  Elevated Lipase Unclear significance - CT without findings c/w pancreatitis, not significantly tender on exam Trend   Decubitus ulcer Wound care consult -> appreciate recs No obvious erythema, drainage, crepitus.   Hypertension Chart diagnosis, doesn't appear to be on any chronic meds for this.     DVT prophylaxis: heparin gtt Code Status: full Family Communication: husband, daughter  Disposition:   Status is: Inpatient Remains inpatient appropriate because: need for continued inpatient care   Consultants:  Palliative care  Procedures:  LE [name] LEFT:  - Findings consistent with acute deep vein thrombosis involving the left  common femoral vein, SF junction, left femoral vein, left proximal  profunda vein, left popliteal vein,  left posterior tibial veins, and left  peroneal veins.   Antimicrobials:  Anti-infectives (From admission, onward)    Start     Dose/Rate Route Frequency Ordered Stop   01/19/23 1900  ceFEPIme (MAXIPIME) 2 g in sodium chloride 0.9 % 100 mL IVPB  Status:  Discontinued        2 g 200 mL/hr over 30 Minutes Intravenous Every 24 hours 01/19/23 0338 01/19/23 0945   01/19/23 1000  cefTRIAXone (ROCEPHIN) 2 g in sodium chloride 0.9 % 100 mL IVPB       Note to Pharmacy: Retime as indicated   2 g 200 mL/hr over 30 Minutes Intravenous Every 24 hours 01/19/23 0945     01/19/23 0342  vancomycin variable dose per unstable renal function (pharmacist dosing)  Status:  Discontinued         Does not apply See admin instructions 01/19/23 0343 01/20/23 0718   01/18/23 1830  vancomycin (VANCOREADY) IVPB 1250 mg/250 mL        1,250 mg 166.7 mL/hr over 90 Minutes Intravenous  Once 01/18/23 1818 01/18/23 2122   01/18/23 1815  ceFEPIme (MAXIPIME) 2 g in sodium chloride 0.9 % 100 mL IVPB        2 g 200 mL/hr over 30 Minutes Intravenous  Once 01/18/23 1807 01/18/23 1911       Subjective: Nonverbal Discussed plan of care with husband  Objective: Vitals:   01/19/23 1700 01/19/23 2315 01/20/23 0316 01/20/23 0754  BP:  120/63 (!) 108/58   Pulse: 98 94 90   Resp:  20 20   Temp:  98.1 F (36.7 C) 98 F (36.7 C) 97.7 F (36.5 C)  TempSrc:  Oral  Oral  SpO2: 100% 100% 98%   Weight:        Intake/Output Summary (Last 24 hours) at 01/20/2023 0912 Last data filed at 01/19/2023 1816 Gross per 24 hour  Intake 1337.79 ml  Output --  Net 1337.79 ml   Filed Weights   01/19/23 0315  Weight: 58.1 kg    Examination:  General exam: frail, chronically ill appearing Respiratory system: unlabored Cardiovascular system: RRR Gastrointestinal system: Abdomen is nondistended, soft and nontender Central nervous system: attends and moans, but doesn't follow commands  Extremities: no lee     Data Reviewed: I  have personally reviewed following labs and imaging studies  CBC: Recent Labs  Lab 01/18/23 1848 01/19/23 0715 01/20/23 0355  WBC 17.8* 16.3* 13.5*  NEUTROABS 15.3*  --  10.4*  HGB 13.2 10.5* 10.4*  HCT 44.2 36.0 34.4*  MCV 86.3 87.0 84.1  PLT 209 162 170    Basic Metabolic Panel: Recent Labs  Lab 01/19/23 0103 01/19/23 0715 01/19/23 1619 01/19/23 1846 01/20/23 0355  NA 156* 153* 151* 153* 149*  K 4.3 3.8 3.9 3.7 3.3*  CL 118* 115* 118* 114* 111  CO2 26 24 21* 25 23  GLUCOSE 123* 168* 123* 119* 155*  BUN 131* 126* 115* 109* 93*  CREATININE 1.71* 1.76* 1.64* 1.70* 1.35*  CALCIUM 8.9 8.7* 8.6* 8.7* 8.3*  MG  --   --   --   --  2.6*  PHOS  --   --   --   --  2.2*    GFR: Estimated Creatinine Clearance: 31.4 mL/min (Meg Niemeier) (by C-G formula based  on SCr of 1.35 mg/dL (H)).  Liver Function Tests: Recent Labs  Lab 01/18/23 1848 01/20/23 0355  AST 48* 38  ALT 30 26  ALKPHOS 64 47  BILITOT 0.4 0.6  PROT 8.4* 6.6  ALBUMIN 3.3* 2.5*    CBG: Recent Labs  Lab 01/19/23 1725 01/19/23 2028 01/19/23 2345 01/20/23 0515 01/20/23 0758  GLUCAP 93 112* 103* 155* 130*     Recent Results (from the past 240 hour(s))  Blood culture (routine x 2)     Status: None (Preliminary result)   Collection Time: 01/18/23  6:48 PM   Specimen: BLOOD RIGHT ARM  Result Value Ref Range Status   Specimen Description BLOOD RIGHT ARM  Final   Special Requests   Final    BOTTLES DRAWN AEROBIC AND ANAEROBIC Blood Culture adequate volume   Culture   Final    NO GROWTH < 12 HOURS Performed at [facility], [address], [place], [place] [address]    Report Status PENDING  Incomplete  Blood culture (routine x 2)     Status: None (Preliminary result)   Collection Time: 01/18/23  6:49 PM   Specimen: BLOOD RIGHT ARM  Result Value Ref Range Status   Specimen Description BLOOD RIGHT ARM  Final   Special Requests   Final    BOTTLES DRAWN AEROBIC AND ANAEROBIC Blood Culture adequate  volume   Culture   Final    NO GROWTH < 12 HOURS Performed at [facility], [address], [place], [place] [address]    Report Status PENDING  Incomplete  SARS Coronavirus 2 by RT PCR (hospital order, performed in [facility] hospital lab) *cepheid single result test* Anterior Nasal Swab     Status: None   Collection Time: 01/18/23 11:09 PM   Specimen: Anterior Nasal Swab  Result Value Ref Range Status   SARS Coronavirus 2 by RT PCR NEGATIVE NEGATIVE Final    Comment: Performed at [facility], [address], [place], [place] [address]  Respiratory (~20 pathogens) panel by PCR     Status: None   Collection Time: 01/18/23 11:09 PM   Specimen: Anterior Nasal Swab; Respiratory  Result Value Ref Range Status   Adenovirus NOT DETECTED NOT DETECTED Final   Coronavirus 229E NOT DETECTED NOT DETECTED Final    Comment: (NOTE) The Coronavirus on the Respiratory Panel, DOES NOT test for the novel  Coronavirus (2019 nCoV)    Coronavirus HKU1 NOT DETECTED NOT DETECTED Final   Coronavirus NL63 NOT DETECTED NOT DETECTED Final   Coronavirus OC43 NOT DETECTED NOT DETECTED Final   Metapneumovirus NOT DETECTED NOT DETECTED Final   Rhinovirus / Enterovirus NOT DETECTED NOT DETECTED Final   Influenza Brionne Mertz NOT DETECTED NOT DETECTED Final   Influenza B NOT DETECTED NOT DETECTED Final   Parainfluenza Virus 1 NOT DETECTED NOT DETECTED Final   Parainfluenza Virus 2 NOT DETECTED NOT DETECTED Final   Parainfluenza Virus 3 NOT DETECTED NOT DETECTED Final   Parainfluenza Virus 4 NOT DETECTED NOT DETECTED Final   Respiratory Syncytial Virus NOT DETECTED NOT DETECTED Final   Bordetella pertussis NOT DETECTED NOT DETECTED Final   Bordetella Parapertussis NOT DETECTED NOT DETECTED Final   Chlamydophila pneumoniae NOT DETECTED NOT DETECTED Final   Mycoplasma pneumoniae NOT DETECTED NOT DETECTED Final    Comment: Performed at [facility], [address] [place], [place] [address]          Radiology Studies: DG CHEST PORT  1 VIEW  Result Date: 01/20/2023 CLINICAL DATA:  77 year old female with history of sepsis. EXAM: PORTABLE CHEST 1 VIEW COMPARISON:  Chest x-ray 01/18/2023. FINDINGS: Lung volumes are normal. No consolidative airspace disease. No pleural effusions. No pneumothorax. No pulmonary nodule or mass noted. Pulmonary vasculature and the cardiomediastinal silhouette are within normal limits. Atherosclerosis in the thoracic aorta. IMPRESSION: 1.  No radiographic evidence of acute cardiopulmonary disease. 2. Aortic atherosclerosis. Electronically Signed   By: Trudie Reed M.D.   On: 01/20/2023 06:10   VAS [name] LOWER EXTREMITY VENOUS (DVT)  Result Date: 01/19/2023  Lower Venous DVT Study Patient Name:  Amanda [name]  Date of Exam:   01/19/2023 Medical Rec #: [id number]            Accession #:    [id number] Date of Birth: [birth year]           Patient Gender: F Patient Age:   61 years Exam Location:  [facility] Procedure:      VAS [name] LOWER EXTREMITY VENOUS (DVT) Referring Phys: Daniel Johndrow POWELL JR --------------------------------------------------------------------------------  Indications: Swelling.  Performing Technologist: Marilynne Halsted RDMS, RVT  Examination Guidelines: Bonita Brindisi complete evaluation includes B-mode imaging, spectral Doppler, color Doppler, and power Doppler as needed of all accessible portions of each vessel. Bilateral testing is considered an integral part of Isys Tietje complete examination. Limited examinations for reoccurring indications may be performed as noted. The reflux portion of the exam is performed with the patient in reverse Trendelenburg.  +-----+---------------+---------+-----------+----------+--------------+ RIGHTCompressibilityPhasicitySpontaneityPropertiesThrombus Aging +-----+---------------+---------+-----------+----------+--------------+ CFV  Full           Yes      Yes                                  +-----+---------------+---------+-----------+----------+--------------+ SFJ  Full                                                        +-----+---------------+---------+-----------+----------+--------------+   +---------+---------------+---------+-----------+----------+--------------+ LEFT     CompressibilityPhasicitySpontaneityPropertiesThrombus Aging +---------+---------------+---------+-----------+----------+--------------+ CFV      None           No       No                   Acute          +---------+---------------+---------+-----------+----------+--------------+ SFJ      None                                         Acute          +---------+---------------+---------+-----------+----------+--------------+ FV Prox  None                                         Acute          +---------+---------------+---------+-----------+----------+--------------+ FV Mid   None           No       No                   Acute          +---------+---------------+---------+-----------+----------+--------------+  FV DistalNone                                         Acute          +---------+---------------+---------+-----------+----------+--------------+ PFV      None                                         Acute          +---------+---------------+---------+-----------+----------+--------------+ POP      None           No       No                   Acute          +---------+---------------+---------+-----------+----------+--------------+ PTV      None                                         Acute          +---------+---------------+---------+-----------+----------+--------------+ PERO     None                                         Acute          +---------+---------------+---------+-----------+----------+--------------+ Unable to evaluate iliac veins.    Summary: RIGHT: - No evidence of common femoral vein obstruction.   LEFT: - Findings consistent with  acute deep vein thrombosis involving the left common femoral vein, SF junction, left femoral vein, left proximal profunda vein, left popliteal vein, left posterior tibial veins, and left peroneal veins.   *See table(s) above for measurements and observations. Electronically signed by Sherald Hess MD on 01/19/2023 at 3:51:26 PM.    Final    CT ABDOMEN PELVIS WO CONTRAST  Result Date: 01/19/2023 CLINICAL DATA:  Sepsis, altered mental status. Sacral decubitus ulcer. EXAM: CT ABDOMEN AND PELVIS WITHOUT CONTRAST TECHNIQUE: Multidetector CT imaging of the abdomen and pelvis was performed following the standard protocol without IV contrast. RADIATION DOSE REDUCTION: This exam was performed according to the departmental dose-optimization program which includes automated exposure control, adjustment of the mA and/or kV according to patient size and/or use of iterative reconstruction technique. COMPARISON:  None Available. FINDINGS: Lower chest: Coronary artery and aortic calcifications. No acute abnormality. Hepatobiliary: Several layering gallstones within the gallbladder. No biliary ductal dilatation or focal hepatic abnormality. Pancreas: No focal abnormality or ductal dilatation. Spleen: No focal abnormality.  Normal size. Adrenals/Urinary Tract: Small amount of air within the urinary bladder, presumably from recent catheterization. Recommend clinical correlation. No renal or adrenal mass. No stones or hydronephrosis. Stomach/Bowel: Large stool burden throughout the colon. Scattered colonic diverticula. No active diverticulitis. Stomach and small bowel decompressed. No bowel obstruction. Vascular/Lymphatic: Aortoiliac atherosclerosis. No evidence of aneurysm or adenopathy. Reproductive: Prior hysterectomy.  No adnexal masses. Other: No free fluid or free air. Musculoskeletal: No acute bony abnormality. No bone destruction to suggest osteomyelitis within the pelvis. No soft tissue fluid collection or air.  IMPRESSION: Small amount air within the urinary bladder, presumably from recent catheterization. Recommend clinical correlation. Large stool burden throughout the colon. Aortic atherosclerosis.  Coronary artery disease. Cholelithiasis. Electronically Signed   By: Charlett Nose M.D.   On: 01/19/2023 00:12   CT MAXILLOFACIAL WO CONTRAST  Result Date: 01/19/2023 CLINICAL DATA:  Question dental abscess left lower region. EXAM: CT MAXILLOFACIAL WITHOUT CONTRAST TECHNIQUE: Multidetector CT imaging of the maxillofacial structures was performed. Multiplanar CT image reconstructions were also generated. RADIATION DOSE REDUCTION: This exam was performed according to the departmental dose-optimization program which includes automated exposure control, adjustment of the mA and/or kV according to patient size and/or use of iterative reconstruction technique. COMPARISON:  None Available. FINDINGS: Osseous: No fracture or mandibular dislocation. No destructive process. No visible periapical abscess. Orbits: Negative. No traumatic or inflammatory finding. Sinuses: Clear Soft tissues: No fluid collection to suggest abscess. In particular, in the region of the left mandible or submandibular region. Limited intracranial: No significant or unexpected finding. IMPRESSION: No facial or orbital fracture. No evidence of dental or soft tissue abscess. Electronically Signed   By: Charlett Nose M.D.   On: 01/19/2023 00:09   CT HEAD WO CONTRAST ( )  Result Date: 01/18/2023 CLINICAL DATA:  Head trauma mental status change EXAM: CT HEAD WITHOUT CONTRAST TECHNIQUE: Contiguous axial images were obtained from the base of the skull through the vertex without intravenous contrast. RADIATION DOSE REDUCTION: This exam was performed according to the departmental dose-optimization program which includes automated exposure control, adjustment of the mA and/or kV according to patient size and/or use of iterative reconstruction technique.  COMPARISON:  CT brain 10/08/2022 FINDINGS: Brain: No acute territorial infarction, hemorrhage, or intracranial mass. Moderate atrophy. Mild chronic small vessel ischemic changes of the white matter. Prominent ventricles felt secondary to atrophy. Vascular: No hyperdense vessels.  Carotid vascular calcification Skull: Normal. Negative for fracture or focal lesion. Sinuses/Orbits: No acute finding. Other: None IMPRESSION: 1. No CT evidence for acute intracranial abnormality. 2. Atrophy and chronic small vessel ischemic changes of the white matter. Electronically Signed   By: Jasmine Pang M.D.   On: 01/18/2023 20:34   DG Chest Portable 1 View  Result Date: 01/18/2023 CLINICAL DATA:  Altered mental status hypertension EXAM: PORTABLE CHEST 1 VIEW COMPARISON:  10/13/2022 FINDINGS: Multiple skin fold artifact over the right chest. No acute airspace disease or effusion. Normal cardiac size. No pneumothorax IMPRESSION: No active disease. Electronically Signed   By: Jasmine Pang M.D.   On: 01/18/2023 20:31        Scheduled Meds:  insulin aspart  0-9 Units Subcutaneous Q4H   silver sulfADIAZINE  1 Application Topical Daily   [START ON 01/27/2023] thiamine (VITAMIN B1) injection  100 mg Intravenous Daily   Continuous Infusions:  cefTRIAXone (ROCEPHIN)  IV 2 g (01/20/23 0855)   dextrose 5 % with KCl 20 mEq / L     heparin 750 Units/hr (01/20/23 0724)   thiamine (VITAMIN B1) injection 500 mg (01/20/23 0640)   Followed by   Melene Muller ON 01/22/2023] thiamine (VITAMIN B1) injection       LOS: 2 days    Time spent: over 30 min    Lacretia Nicks, MD [facility]   To contact the attending provider between 7A-7P or the covering provider during after hours 7P-7A, please log into the web site [email or web address] and access using universal [place] password for that web site. If you do not have the password, please call the hospital operator.  01/20/2023, 9:12 AM

## 2023-01-20 NOTE — Progress Notes (Signed)
PHARMACY - ANTICOAGULATION CONSULT NOTE  Pharmacy Consult for heparin Indication: DVT  No Known Allergies  Patient Measurements: Weight: 58.1 kg (128 lb) Heparin Dosing Weight: 58.1kg  Vital Signs: Temp: 98 F (36.7 C) (10/03 0316) Temp Source: Oral (10/02 2315) BP: 108/58 (10/03 0316) Pulse Rate: 90 (10/03 0316)  Labs: Recent Labs    01/18/23 1848 01/18/23 2042 01/19/23 0103 01/19/23 0715 01/19/23 1619 01/19/23 1846 01/20/23 0355  HGB 13.2  --   --  10.5*  --   --  10.4*  HCT 44.2  --   --  36.0  --   --  34.4*  PLT 209  --   --  162  --   --  170  HEPARINUNFRC  --   --   --   --   --   --  >1.10*  CREATININE 2.36*  --    < > 1.76* 1.64* 1.70* 1.35*  TROPONINIHS 32* 25*  --   --   --   --   --    < > = values in this interval not displayed.    Estimated Creatinine Clearance: 31.4 mL/min (A) (by C-G formula based on SCr of 1.35 mg/dL (H)).   Medical History: Past Medical History:  Diagnosis Date   Dementia (HCC)    Dyslipidemia     Assessment: 76YOF with PMH of dementia, HLD presented with DVT. Pharmacy consulted to dose heparin.  Hgb 13.5>10.5, Plt 209>162, decreased, likely dilutional  Patient received subcutaneous heparin dose just prior to heparin consult.  10/3 AM update:  Heparin level sub-therapeutic  Goal of Therapy:  Heparin level 0.3-0.7 units/ml Monitor platelets by anticoagulation protocol: Yes   Plan:  Hold heparin x 1 hr Re-start heparin at 750 units/hr 1400 heparin level   Abran Duke, PharmD, BCPS Clinical Pharmacist Phone: [phone]

## 2023-01-20 NOTE — Progress Notes (Signed)
PHARMACY - ANTICOAGULATION CONSULT NOTE  Pharmacy Consult for heparin Indication: DVT  No Known Allergies  Patient Measurements: Weight: 58.1 kg (128 lb) Heparin Dosing Weight: 58.1kg  Vital Signs: Temp: 97.7 F (36.5 C) (10/03 0754) Temp Source: Oral (10/03 0754) BP: 108/59 (10/03 1252) Pulse Rate: 70 (10/03 1252)  Labs: Recent Labs    01/18/23 1848 01/18/23 2042 01/19/23 0103 01/19/23 0715 01/19/23 1619 01/19/23 1846 01/20/23 0355 01/20/23 1423  HGB 13.2  --   --  10.5*  --   --  10.4*  --   HCT 44.2  --   --  36.0  --   --  34.4*  --   PLT 209  --   --  162  --   --  170  --   HEPARINUNFRC  --   --   --   --   --   --  >1.10* >1.10*  CREATININE 2.36*  --    < > 1.76* 1.64* 1.70* 1.35*  --   TROPONINIHS 32* 25*  --   --   --   --   --   --    < > = values in this interval not displayed.    Estimated Creatinine Clearance: 31.4 mL/min (A) (by C-G formula based on SCr of 1.35 mg/dL (H)).   Medical History: Past Medical History:  Diagnosis Date   Dementia (HCC)    Dyslipidemia     Assessment: 76YOF with PMH of dementia, HLD presented with DVT. Pharmacy consulted to dose heparin.  Hgb 13.5>10.5, Plt 209>162, decreased, likely dilutional  10/3 PM update:  Heparin level >1.1, confirmed with lab that it was drawn from opposite arm that heparin is infusing. No bleeding reported.  Goal of Therapy:  Heparin level 0.3-0.7 units/ml Monitor platelets by anticoagulation protocol: Yes   Plan:  Hold heparin drip x 1 hour Decrease heparin infusion to 600 units/hr Check heparin level 6hrs after restarting. Daily heparin level and CBC Follow up long terms plans for [facility]   Loralee Pacas, PharmD, BCPS Phone: [phone]

## 2023-01-21 ENCOUNTER — Inpatient Hospital Stay (HOSPITAL_COMMUNITY): Payer: PPO

## 2023-01-21 ENCOUNTER — Other Ambulatory Visit (HOSPITAL_COMMUNITY): Payer: Self-pay

## 2023-01-21 DIAGNOSIS — R569 Unspecified convulsions: Secondary | ICD-10-CM | POA: Diagnosis not present

## 2023-01-21 DIAGNOSIS — G9341 Metabolic encephalopathy: Secondary | ICD-10-CM | POA: Diagnosis not present

## 2023-01-21 DIAGNOSIS — R4182 Altered mental status, unspecified: Secondary | ICD-10-CM

## 2023-01-21 LAB — GLUCOSE, CAPILLARY
Glucose-Capillary: 101 mg/dL — ABNORMAL HIGH (ref 70–99)
Glucose-Capillary: 129 mg/dL — ABNORMAL HIGH (ref 70–99)
Glucose-Capillary: 159 mg/dL — ABNORMAL HIGH (ref 70–99)
Glucose-Capillary: 56 mg/dL — ABNORMAL LOW (ref 70–99)
Glucose-Capillary: 76 mg/dL (ref 70–99)
Glucose-Capillary: 85 mg/dL (ref 70–99)
Glucose-Capillary: 98 mg/dL (ref 70–99)
Glucose-Capillary: 99 mg/dL (ref 70–99)

## 2023-01-21 LAB — CBC WITH DIFFERENTIAL/PLATELET
Abs Immature Granulocytes: 0.6 10*3/uL — ABNORMAL HIGH (ref 0.00–0.07)
Basophils Absolute: 0.1 10*3/uL (ref 0.0–0.1)
Basophils Relative: 1 %
Eosinophils Absolute: 0 10*3/uL (ref 0.0–0.5)
Eosinophils Relative: 0 %
HCT: 36.2 % (ref 36.0–46.0)
Hemoglobin: 10.9 g/dL — ABNORMAL LOW (ref 12.0–15.0)
Immature Granulocytes: 5 %
Lymphocytes Relative: 11 %
Lymphs Abs: 1.2 10*3/uL (ref 0.7–4.0)
MCH: 26.3 pg (ref 26.0–34.0)
MCHC: 30.1 g/dL (ref 30.0–36.0)
MCV: 87.2 fL (ref 80.0–100.0)
Monocytes Absolute: 1 10*3/uL (ref 0.1–1.0)
Monocytes Relative: 8 %
Neutro Abs: 8.4 10*3/uL — ABNORMAL HIGH (ref 1.7–7.7)
Neutrophils Relative %: 75 %
Platelets: 185 10*3/uL (ref 150–400)
RBC: 4.15 MIL/uL (ref 3.87–5.11)
RDW: 15.6 % — ABNORMAL HIGH (ref 11.5–15.5)
WBC: 11.3 10*3/uL — ABNORMAL HIGH (ref 4.0–10.5)
nRBC: 0.3 % — ABNORMAL HIGH (ref 0.0–0.2)

## 2023-01-21 LAB — COMPREHENSIVE METABOLIC PANEL
ALT: 36 U/L (ref 0–44)
AST: 64 U/L — ABNORMAL HIGH (ref 15–41)
Albumin: 2.6 g/dL — ABNORMAL LOW (ref 3.5–5.0)
Alkaline Phosphatase: 55 U/L (ref 38–126)
Anion gap: 10 (ref 5–15)
BUN: 43 mg/dL — ABNORMAL HIGH (ref 8–23)
CO2: 25 mmol/L (ref 22–32)
Calcium: 8.2 mg/dL — ABNORMAL LOW (ref 8.9–10.3)
Chloride: 107 mmol/L (ref 98–111)
Creatinine, Ser: 0.84 mg/dL (ref 0.44–1.00)
GFR, Estimated: >60 mL/min (ref >60)
Glucose, Bld: 109 mg/dL — ABNORMAL HIGH (ref 70–99)
Potassium: 3.4 mmol/L — ABNORMAL LOW (ref 3.5–5.1)
Sodium: 142 mmol/L (ref 135–145)
Total Bilirubin: 0.7 mg/dL (ref 0.3–1.2)
Total Protein: 6.9 g/dL (ref 6.5–8.1)

## 2023-01-21 LAB — PHOSPHORUS: Phosphorus: 1.7 mg/dL — ABNORMAL LOW (ref 2.5–4.6)

## 2023-01-21 LAB — HEPARIN LEVEL (UNFRACTIONATED)
Heparin Unfractionated: >1.1 [IU]/mL — ABNORMAL HIGH (ref 0.30–0.70)
Heparin Unfractionated: 0.42 [IU]/mL (ref 0.30–0.70)
Heparin Unfractionated: 0.15 [IU]/mL — ABNORMAL LOW (ref 0.30–0.70)

## 2023-01-21 LAB — MAGNESIUM: Magnesium: 2.2 mg/dL (ref 1.7–2.4)

## 2023-01-21 LAB — BASIC METABOLIC PANEL
Anion gap: 12 (ref 5–15)
BUN: 22 mg/dL (ref 8–23)
CO2: 23 mmol/L (ref 22–32)
Calcium: 8.1 mg/dL — ABNORMAL LOW (ref 8.9–10.3)
Chloride: 108 mmol/L (ref 98–111)
Creatinine, Ser: 0.71 mg/dL (ref 0.44–1.00)
GFR, Estimated: >60 mL/min (ref >60)
Glucose, Bld: 109 mg/dL — ABNORMAL HIGH (ref 70–99)
Potassium: 4 mmol/L (ref 3.5–5.1)
Sodium: 143 mmol/L (ref 135–145)

## 2023-01-21 LAB — FOLATE: Folate: 5 ng/mL — ABNORMAL LOW (ref >5.9)

## 2023-01-21 LAB — VITAMIN B12: Vitamin B-12: 1261 pg/mL — ABNORMAL HIGH (ref 180–914)

## 2023-01-21 LAB — LIPASE, BLOOD: Lipase: 196 U/L — ABNORMAL HIGH (ref 11–51)

## 2023-01-21 MED ORDER — HEPARIN (PORCINE) 25000 UT/250ML-% IV SOLN
650.0000 [IU]/h | INTRAVENOUS | Status: DC
  Administered 2023-01-21: 400 [IU]/h via INTRAVENOUS
  Filled 2023-01-21: qty 250

## 2023-01-21 MED ORDER — FOLIC ACID 5 MG/ML IJ SOLN
1.0000 mg | Freq: Every day | INTRAMUSCULAR | Status: DC
  Administered 2023-01-21 – 2023-01-31 (×11): 1 mg via INTRAVENOUS
  Filled 2023-01-21 (×14): qty 0.2

## 2023-01-21 MED ORDER — DEXTROSE 50 % IV SOLN
1.0000 | Freq: Once | INTRAVENOUS | Status: AC
  Administered 2023-01-21: 50 mL via INTRAVENOUS

## 2023-01-21 MED ORDER — DEXTROSE IN LACTATED RINGERS 5 % IV SOLN: INTRAVENOUS | Status: DC

## 2023-01-21 MED ORDER — SODIUM CHLORIDE 0.9 % IV SOLN: 1.0000 mg | Freq: Every day | INTRAVENOUS | Status: DC

## 2023-01-21 MED ORDER — POTASSIUM PHOSPHATES 15 MMOLE/5ML IV SOLN
15.0000 mmol | Freq: Once | INTRAVENOUS | Status: AC
  Administered 2023-01-21: 15 mmol via INTRAVENOUS
  Filled 2023-01-21: qty 5

## 2023-01-21 MED ORDER — DEXTROSE 50 % IV SOLN
INTRAVENOUS | Status: AC
  Filled 2023-01-21: qty 50

## 2023-01-21 MED ORDER — DEXTROSE 10 % IV SOLN: INTRAVENOUS | Status: DC

## 2023-01-21 NOTE — Progress Notes (Signed)
PHARMACY - ANTICOAGULATION  Pharmacy Consult for heparin Indication: DVT Brief A/P: Heparin level supratherapeutic Decrease Heparin rate  No Known Allergies  Patient Measurements: Weight: 58.1 kg (128 lb) Heparin Dosing Weight: 58.1kg  Vital Signs: Temp: 97.8 F (36.6 C) (10/04 0014) Temp Source: Oral (10/04 0014) BP: 106/60 (10/04 0014) Pulse Rate: 86 (10/04 0014)  Labs: Recent Labs    01/18/23 1848 01/18/23 2042 01/19/23 0103 01/19/23 0715 01/19/23 1619 01/19/23 1846 01/20/23 0355 01/20/23 1423 01/20/23 2024 01/21/23 0043  HGB 13.2  --   --  10.5*  --   --  10.4*  --   --   --   HCT 44.2  --   --  36.0  --   --  34.4*  --   --   --   PLT 209  --   --  162  --   --  170  --   --   --   HEPARINUNFRC  --   --   --   --   --   --  >1.10* >1.10*  --  >1.10*  CREATININE 2.36*  --    < > 1.76*   < > 1.70* 1.35*  --  1.05*  --   TROPONINIHS 32* 25*  --   --   --   --   --   --   --   --    < > = values in this interval not displayed.    Estimated Creatinine Clearance: 40.4 mL/min (A) (by C-G formula based on SCr of 1.05 mg/dL (H)).  Assessment: 77 y.o. female with DVT for heparin.    Goal of Therapy:  Heparin level 0.3-0.7 units/ml Monitor platelets by anticoagulation protocol: Yes   Plan:  Hold x 2 hours, then restart heparin 400 units/hr Check heparin level in 8 hours.  Geannie Risen, PharmD, BCPS

## 2023-01-21 NOTE — Progress Notes (Signed)
EEG complete - results pending.   GMD/ TM 

## 2023-01-21 NOTE — Progress Notes (Signed)
PHARMACY - ANTICOAGULATION  Pharmacy Consult for heparin Indication: DVT   No Known Allergies  Patient Measurements: Weight: 58.1 kg (128 lb) Heparin Dosing Weight: 58.1kg  Vital Signs: Temp: 98.2 F (36.8 C) (10/04 0746) Temp Source: Oral (10/04 1135) BP: 127/69 (10/04 1135) Pulse Rate: 82 (10/04 1135)  Labs: Recent Labs    01/18/23 1848 01/18/23 2042 01/19/23 0103 01/19/23 0715 01/19/23 1619 01/20/23 0355 01/20/23 1423 01/20/23 2024 01/21/23 0043 01/21/23 0440 01/21/23 1239  HGB 13.2  --   --  10.5*  --  10.4*  --   --   --  10.9*  --   HCT 44.2  --   --  36.0  --  34.4*  --   --   --  36.2  --   PLT 209  --   --  162  --  170  --   --   --  185  --   HEPARINUNFRC  --   --   --   --    < > >1.10* >1.10*  --  >1.10*  --  0.42  CREATININE 2.36*  --    < > 1.76*   < > 1.35*  --  1.05*  --  0.84  --   TROPONINIHS 32* 25*  --   --   --   --   --   --   --   --   --    < > = values in this interval not displayed.    Estimated Creatinine Clearance: 50.5 mL/min (by C-G formula based on SCr of 0.84 mg/dL).  Assessment: 77 y.o. female with DVT for heparin.  Heparin level drawn this morning from opposite arm was supratherapeutic and rate was lowered.  Now level back in goal range at 0.42.  No overt bleeding or complications noted.  Goal of Therapy:  Heparin level 0.3-0.7 units/ml Monitor platelets by anticoagulation protocol: Yes   Plan:  Continue IV heparin at 400 units/hr Repeat heparin level in 8 hrs. Daily heparin level and CBC.  Reece Leader, Colon Flattery, BCCP Clinical Pharmacist  01/21/2023 1:40 PM   [facility] pharmacy phone numbers are listed on [email or web address]

## 2023-01-21 NOTE — TOC Benefit Eligibility Note (Signed)
Patient Product/process development scientist completed.    The patient is insured through [employer]/ [employer]. Patient has Medicare and is not eligible for a copay card, but may be able to apply for patient assistance, if available.    Ran test claim for Eliquis 5 mg and the current 30 day co-pay is $47.00.  Ran test claim for Xarelto 20 mg and the current 30 day co-pay is $47.00.  This test claim was processed through [facility]- copay amounts may vary at other pharmacies due to pharmacy/plan contracts, or as the patient moves through the different stages of their insurance plan.     Amanda [name], CPHT Pharmacy Technician III Certified Patient Advocate [facility] Patient Advocate Team Direct Number: [phone]  Fax: [phone]

## 2023-01-21 NOTE — TOC Progression Note (Addendum)
Transition of Care [facility]) - Progression Note    Patient Details  Name: Amanda [name] MRN: [id number] Date of Birth: 1945-12-30  Transition of Care [facility] CM/SW Contact  Eduard Roux, [place] Phone Number: 01/21/2023, 2:19 PM  Clinical Narrative:     CSW spoke with patient's spouse,[name]. CSW introduced self and explained role. CSW discussed short term rehab  at [facility]. He advised, patient was recently discharged from [facility]. Per Eligha Bridegroom patient was discharged 12/22/22. He states he will discuss recommendations with their children to determine if she will go home verses SNF. The preference is for the patient to return home but states he will advised TOC after further discussion with family.   Patient may have co-pays if family decides on SNF.   TOC will continue to follow and assist with discharge planning.  Antony Blackbird, MSW, LCSW Clinical Social Worker                                                    Social Determinants of Health (SDOH) Interventions SDOH Screenings   Food Insecurity: No Food Insecurity (10/09/2022)  Housing: Low Risk  (10/18/2022)  Transportation Needs: No Transportation Needs (10/09/2022)  Utilities: Not At Risk (10/09/2022)  Depression (PHQ2-9): Low Risk  (09/14/2022)  Financial Resource Strain: Low Risk  (09/14/2022)  Physical Activity: Inactive (09/14/2022)  Stress: No Stress Concern Present (09/14/2022)  Tobacco Use: Low Risk  (01/18/2023)    Readmission Risk Interventions     No data to display

## 2023-01-21 NOTE — Social Work (Signed)
CSW called patient's spouse- received no answer, will try again later today.  Antony Blackbird, MSW, LCSW Clinical Social Worker

## 2023-01-21 NOTE — Evaluation (Signed)
Clinical/Bedside Swallow Evaluation Patient Details  Name: Amanda [name] MRN: [id number] Date of Birth: 1946/04/10  Today's Date: 01/21/2023 Time: SLP Start Time (ACUTE ONLY): 0959 SLP Stop Time (ACUTE ONLY): 1011 SLP Time Calculation (min) (ACUTE ONLY): 12 min  Past Medical History:  Past Medical History:  Diagnosis Date   Dementia (HCC)    Dyslipidemia    Past Surgical History: History reviewed. No pertinent surgical history. HPI:  Amanda [name] is a 77 yo female presenting to ED 10/1 with worsening AMS and poor PO intake. Admitted with AKI and hypernatremia. Seen by SLP June 2024 with cognitively based dysphagia c/b sustained attention deficits affecting mastication and oral transit. Per notes at that time, pt orally holds foods she does not like and her husband is typically present to order desired food choices and provide careful hand feeding. PMH includes moderate dementia    Assessment / Plan / Recommendation  Clinical Impression  Pt lethargic, rousing minimally to verbal stimuli and unable to sustain alertness for periods adequate for PO intake. RN reports pt is more alert this date compared to previous date. She was unable to follow commands to complete oral motor exam, although note pt is missing top dentition and presents with a dental occlusion. Attempted presentations of ice chips and purees with poor bolus awareness. She followed commands intermittently to open her mouth, although with no functional outcome due to lethargy. Overall, pt is not demonstrating a level of alertness adequate for PO intake at this time and should remain NPO. Pending further GOC discussions, potential for initiating a diet with careful hand feeding is possible if pt can remain alert for more sustained periods of time. Suspect pt's cognition will also be a factor impacting swallowing presentation if she is able to remain alert. SLP will continue to follow for GOC and pt's ability to trial  additional POs. SLP Visit Diagnosis: Dysphagia, unspecified (R13.10)    Aspiration Risk  Moderate aspiration risk    Diet Recommendation NPO    Medication Administration: Via alternative means    Other  Recommendations Oral Care Recommendations: Oral care QID;Staff/trained caregiver to provide oral care    Recommendations for follow up therapy are one component of a multi-disciplinary discharge planning process, led by the attending physician.  Recommendations may be updated based on patient status, additional functional criteria and insurance authorization.  Follow up Recommendations Skilled nursing-short term rehab (<3 hours/day)      Assistance Recommended at Discharge    Functional Status Assessment Patient has had a recent decline in their functional status and demonstrates the ability to make significant improvements in function in a reasonable and predictable amount of time.  Frequency and Duration min 2x/week  2 weeks       Prognosis Prognosis for improved oropharyngeal function: Fair Barriers to Reach Goals: Cognitive deficits;Time post onset;Severity of deficits      Swallow Study   General HPI: Amanda [name] is a 77 yo female presenting to ED 10/1 with worsening AMS and poor PO intake. Admitted with AKI and hypernatremia. Seen by SLP June 2024 with cognitively based dysphagia c/b sustained attention deficits affecting mastication and oral transit. Per notes at that time, pt orally holds foods she does not like and her husband is typically present to order desired food choices and provide careful hand feeding. PMH includes moderate dementia Type of Study: Bedside Swallow Evaluation Previous Swallow Assessment: see HPI Diet Prior to this Study: NPO Temperature Spikes Noted: No Respiratory Status: Room air  History of Recent Intubation: No Behavior/Cognition: Lethargic/Drowsy;Doesn't follow directions Oral Cavity Assessment: Within Functional Limits Oral Care  Completed by SLP: No Oral Cavity - Dentition: Missing dentition Vision: Functional for self-feeding Self-Feeding Abilities: Total assist Patient Positioning: Upright in bed Baseline Vocal Quality: Not observed Volitional Cough: Cognitively unable to elicit Volitional Swallow: Unable to elicit    Oral/Motor/Sensory Function Overall Oral Motor/Sensory Function: Other (comment) (difficult to assess)   Ice Chips Ice chips: Impaired Presentation: Spoon Oral Phase Impairments: Poor awareness of bolus   Thin Liquid Thin Liquid: Not tested    Nectar Thick Nectar Thick Liquid: Not tested   Honey Thick Honey Thick Liquid: Not tested   Puree Puree: Impaired Presentation: Spoon Oral Phase Impairments: Poor awareness of bolus   Solid     Solid: Not tested      Gwynneth Aliment, M.A., CF-SLP Speech Language Pathology, Acute Rehabilitation Services  Secure Chat preferred [phone]  01/21/2023,10:49 AM

## 2023-01-21 NOTE — Progress Notes (Signed)
PHARMACY - ANTICOAGULATION   Pharmacy Consult for heparin Indication: LLE DVT   No Known Allergies  Patient Measurements: Weight: 58.1 kg (128 lb) Heparin Dosing Weight: 58.1kg  Vital Signs: Temp: 98.2 F (36.8 C) (10/04 1935) Temp Source: Oral (10/04 1935) BP: 110/82 (10/04 1935) Pulse Rate: 82 (10/04 1601)  Labs: Recent Labs    01/19/23 0715 01/19/23 1619 01/20/23 0355 01/20/23 1423 01/20/23 2024 01/21/23 0043 01/21/23 0440 01/21/23 1239 01/21/23 2043  HGB 10.5*  --  10.4*  --   --   --  10.9*  --   --   HCT 36.0  --  34.4*  --   --   --  36.2  --   --   PLT 162  --  170  --   --   --  185  --   --   HEPARINUNFRC  --   --  >1.10*   < >  --  >1.10*  --  0.42 0.15*  CREATININE 1.76*   < > 1.35*  --  1.05*  --  0.84  --  0.71   < > = values in this interval not displayed.    Estimated Creatinine Clearance: 53 mL/min (by C-G formula based on SCr of 0.71 mg/dL).  Assessment: 78 y.o. female with new LLE DVT for heparin.  Heparin level now subtherapeutic on heparin at 400 units/hr.  No IV issues noted.  Goal of Therapy:  Heparin level 0.3-0.7 units/ml Monitor platelets by anticoagulation protocol: Yes   Plan:  Increase heparin to 500 units/hr Next heparin level with AM labs. Daily heparin level and CBC.   [employer], Pharm.D., BCPS Clinical Pharmacist  **Pharmacist phone directory can be found on [email or web address] listed under [facility].  01/21/2023 9:56 PM

## 2023-01-21 NOTE — Progress Notes (Signed)
     Referral received for [name]: goals of care discussion. Chart reviewed and updates received from RN. Patient assessed and is unable to engage appropriately in discussions.   I was able to speak with the patient's husband Donyale Falcon. GOC meeting scheduled for 01/23/2023 @ 1130 am. Family is aware we will meet at patient's bedside.   Detailed note and recommendations to follow once GOC has been completed.   Thank you for your referral and allowing PMT to assist in Joyce Gignac's care.   Sarina Ser, NP Palliative Medicine Team  Team Phone # [phone]   NO CHARGE

## 2023-01-21 NOTE — Procedures (Signed)
Patient Name: Amanda [name]  MRN: [id number]  Epilepsy Attending: Charlsie Quest  Referring Physician/Provider: Zigmund Daniel., MD  Date: 01/21/2023 Duration: 24.06 mins  Patient history: 77yo F with ams getting eeg to evaluate for seizure  Level of alertness: Awake  AEDs during EEG study: None  Technical aspects: This EEG study was done with scalp electrodes positioned according to the 10-20 International system of electrode placement. Electrical activity was reviewed with band pass filter of 1-70Hz , sensitivity of 7 uV/mm, display speed of 34mm/sec with a 60Hz  notched filter applied as appropriate. EEG data were recorded continuously and digitally stored.  Video monitoring was available and reviewed as appropriate.  Description: EEG showed continuous generalized 3 to 6 Hz theta-delta slowing. Hyperventilation and photic stimulation were not performed.     ABNORMALITY - Continuous slow, generalized  IMPRESSION: This study is suggestive of moderate to severe diffuse encephalopathy. No seizures or epileptiform discharges were seen throughout the recording.  [name]

## 2023-01-21 NOTE — Progress Notes (Addendum)
PROGRESS NOTE    [name]  ZOX:[id number] DOB: [birth year] DOA: 01/18/2023 PCP: [name], MD  Chief Complaint  Patient presents with   Altered Mental Status   Hypertension    Brief Narrative:   Amanda [name] is Amanda [name] 77 y.o. female with medical history significant of moderate dementia who presents with progressive AMS and poor PO intake.    She's been admitted with AKI, hypernatremia, and AMS.   Assessment & Plan:   Principal Problem:   Acute metabolic encephalopathy Active Problems:   AKI (acute kidney injury) (HCC)   Hypernatremia   SIRS (systemic inflammatory response syndrome) (HCC)   Decubitus ulcer   Hypertension   Moderate dementia (HCC)  Goals of Care Patient remains critically ill with persistent encephalopathy. Her overall prognosis is poor.  With improvement in her creatinine/bun, her mental status has not significantly improved.  Has chronic dementia.  I doubt her ability to take adequate PO safely.  Discussed with husband 10/2, he wants full code at this time.  Discussed additionally 10/3, I recommended involvement of palliative care to help with continued goc conversations.     Acute metabolic encephalopathy Dementia  Dementia for years.  at baseline, wheelchair bound (walked 80 ft with walker with assistance in early September at rehab - wheelchair bound since being home), she's disoriented typically, but will communicate.   Likely multifactorial (hypernatremia, uremia/AKI, dementia) but inciting cause unclear.  Not able to discuss with family yet today to discuss her baseline and timing for this decline. No clear infectious cause (CXR without active disease, CT maxillofacial without notable findings, CT abd/pelvis without acute findings, decub doesn't appear infected) TSH wnl, ammonia wnl  B12, folate pending EEG CT head without acute abnormality Treating hypernatremia/AKI (significantly improved today) Consider additional workup/imaging -  she remains altered, unable to follow commands or communicate    SIRS (systemic inflammatory response syndrome) (HCC) Initial tachycardia, WBC 17k.  Question of SIRS / Sepsis with unclear source: UA neg No obvious dental infection on my exam Decubitus ulcer is at least stage 2, but no obvious erythema / drainage S/p cefepime and vanc in ED CT maxilofacial to r/o dental abscess -> without acute findings CT abd pelvis with large stool burden, air within bladder Repeat CXR 10/3 -> without acute cardiopulm dz  Blood cultures pending Covid and RVP negative Will continue ceftriaxone empirically (7 days) for now, broaden as indicated    Dysphagia Not safe for PO at this time given AMS - daughter asked about TPN -> discussed we'd prefer enteral nutrition since no contraindication, if artificial nutrition pursued, would probably recommend tube feeds via cortrak (would need to discuss whether time limited trial).  My preference would be to avoid feeding tube with her history, will discuss additionally.   SLP eval today    Hypernatremia Severe dehydration due to decreased PO intake. improved   AKI  likely pre-renal with hx poor PO intake Baseline creatinine 0.69 Significantly improved CT without hydro    DVT Heparin gtt  Elevated Lipase Unclear significance - CT without findings c/w pancreatitis, not significantly tender on exam Trend   Decubitus ulcer Wound care consult -> appreciate recs No obvious erythema, drainage, crepitus.   Hypertension Chart diagnosis, doesn't appear to be on any chronic meds for this.     DVT prophylaxis: heparin gtt Code Status: full Family Communication: husband - called daughter, no answer Disposition:   Status is: Inpatient Remains inpatient appropriate because: need for continued inpatient care  Consultants:  Palliative care  Procedures:  LE [name] LEFT:  - Findings consistent with acute deep vein thrombosis involving the left  common  femoral vein, SF junction, left femoral vein, left proximal  profunda vein, left popliteal vein, left posterior tibial veins, and left  peroneal veins.   Antimicrobials:  Anti-infectives (From admission, onward)    Start     Dose/Rate Route Frequency Ordered Stop   01/19/23 1900  ceFEPIme (MAXIPIME) 2 g in sodium chloride 0.9 % 100 mL IVPB  Status:  Discontinued        2 g 200 mL/hr over 30 Minutes Intravenous Every 24 hours 01/19/23 0338 01/19/23 0945   01/19/23 1000  cefTRIAXone (ROCEPHIN) 2 g in sodium chloride 0.9 % 100 mL IVPB       Note to Pharmacy: Retime as indicated   2 g 200 mL/hr over 30 Minutes Intravenous Every 24 hours 01/19/23 0945     01/19/23 0342  vancomycin variable dose per unstable renal function (pharmacist dosing)  Status:  Discontinued         Does not apply See admin instructions 01/19/23 0343 01/20/23 0718   01/18/23 1830  vancomycin (VANCOREADY) IVPB 1250 mg/250 mL        1,250 mg 166.7 mL/hr over 90 Minutes Intravenous  Once 01/18/23 1818 01/18/23 2122   01/18/23 1815  ceFEPIme (MAXIPIME) 2 g in sodium chloride 0.9 % 100 mL IVPB        2 g 200 mL/hr over 30 Minutes Intravenous  Once 01/18/23 1807 01/18/23 1911       Subjective: Nonverbal  Discussed with husband  Objective: Vitals:   01/20/23 1558 01/21/23 0014 01/21/23 0350 01/21/23 0746  BP:  106/60 130/66 (!) 127/58  Pulse:  86 84 83  Resp:  20 19 16   Temp: (!) 96.4 F (35.8 C) 97.8 F (36.6 C) 98.3 F (36.8 C) 98.2 F (36.8 C)  TempSrc: Axillary Oral Oral Axillary  SpO2:  100% 100% 100%  Weight:        Intake/Output Summary (Last 24 hours) at 01/21/2023 0844 Last data filed at 01/21/2023 0500 Gross per 24 hour  Intake 1686.19 ml  Output 800 ml  Net 886.19 ml   Filed Weights   01/19/23 0315  Weight: 58.1 kg    Examination:  General: chronically ill appearing, frail  Cardiovascular: RRR Lungs: unlabored Abdomen: Soft, nontender, nondistended Neurological: awake, not  interactive or following commands Extremities: No clubbing or cyanosis. No edema.   Data Reviewed: I have personally reviewed following labs and imaging studies  CBC: Recent Labs  Lab 01/18/23 1848 01/19/23 0715 01/20/23 0355 01/21/23 0440  WBC 17.8* 16.3* 13.5* 11.3*  NEUTROABS 15.3*  --  10.4* 8.4*  HGB 13.2 10.5* 10.4* 10.9*  HCT 44.2 36.0 34.4* 36.2  MCV 86.3 87.0 84.1 87.2  PLT 209 162 170 185    Basic Metabolic Panel: Recent Labs  Lab 01/19/23 1619 01/19/23 1846 01/20/23 0355 01/20/23 2024 01/21/23 0440  NA 151* 153* 149* 148* 142  K 3.9 3.7 3.3* 4.0 3.4*  CL 118* 114* 111 109 107  CO2 21* 25 23 21* 25  GLUCOSE 123* 119* 155* 100* 109*  BUN 115* 109* 93* 59* 43*  CREATININE 1.64* 1.70* 1.35* 1.05* 0.84  CALCIUM 8.6* 8.7* 8.3* 8.6* 8.2*  MG  --   --  2.6*  --  2.2  PHOS  --   --  2.2*  --  1.7*    GFR: Estimated Creatinine Clearance: 50.5  mL/min (by C-G formula based on SCr of 0.84 mg/dL).  Liver Function Tests: Recent Labs  Lab 01/18/23 1848 01/20/23 0355 01/21/23 0440  AST 48* 38 64*  ALT 30 26 36  ALKPHOS 64 47 55  BILITOT 0.4 0.6 0.7  PROT 8.4* 6.6 6.9  ALBUMIN 3.3* 2.5* 2.6*    CBG: Recent Labs  Lab 01/20/23 1603 01/20/23 2041 01/21/23 0017 01/21/23 0355 01/21/23 0752  GLUCAP 94 103* 85 99 76     Recent Results (from the past 240 hour(s))  Blood culture (routine x 2)     Status: None (Preliminary result)   Collection Time: 01/18/23  6:48 PM   Specimen: BLOOD RIGHT ARM  Result Value Ref Range Status   Specimen Description BLOOD RIGHT ARM  Final   Special Requests   Final    BOTTLES DRAWN AEROBIC AND ANAEROBIC Blood Culture adequate volume   Culture   Final    NO GROWTH 3 DAYS Performed at [facility], [address] [place], [place] [address]    Report Status PENDING  Incomplete  Blood culture (routine x 2)     Status: None (Preliminary result)   Collection Time: 01/18/23  6:49 PM   Specimen: BLOOD RIGHT ARM  Result  Value Ref Range Status   Specimen Description BLOOD RIGHT ARM  Final   Special Requests   Final    BOTTLES DRAWN AEROBIC AND ANAEROBIC Blood Culture adequate volume   Culture   Final    NO GROWTH 3 DAYS Performed at [facility], [address] [place], [place] [address]    Report Status PENDING  Incomplete  Urine Culture     Status: None   Collection Time: 01/18/23  7:07 PM   Specimen: Urine, Clean Catch  Result Value Ref Range Status   Specimen Description URINE, CLEAN CATCH  Final   Special Requests NONE  Final   Culture   Final    NO GROWTH Performed at [facility], [address], [place], [place] [address]    Report Status 01/20/2023 FINAL  Final  SARS Coronavirus 2 by RT PCR (hospital order, performed in [facility] hospital lab) *cepheid single result test* Anterior Nasal Swab     Status: None   Collection Time: 01/18/23 11:09 PM   Specimen: Anterior Nasal Swab  Result Value Ref Range Status   SARS Coronavirus 2 by RT PCR NEGATIVE NEGATIVE Final    Comment: Performed at [facility], [address], [place], [place] [address]  Respiratory (~20 pathogens) panel by PCR     Status: None   Collection Time: 01/18/23 11:09 PM   Specimen: Anterior Nasal Swab; Respiratory  Result Value Ref Range Status   Adenovirus NOT DETECTED NOT DETECTED Final   Coronavirus 229E NOT DETECTED NOT DETECTED Final    Comment: (NOTE) The Coronavirus on the Respiratory Panel, DOES NOT test for the novel  Coronavirus (2019 nCoV)    Coronavirus HKU1 NOT DETECTED NOT DETECTED Final   Coronavirus NL63 NOT DETECTED NOT DETECTED Final   Coronavirus OC43 NOT DETECTED NOT DETECTED Final   Metapneumovirus NOT DETECTED NOT DETECTED Final   Rhinovirus / Enterovirus NOT DETECTED NOT DETECTED Final   Influenza Yousuf Ager NOT DETECTED NOT DETECTED Final   Influenza B NOT DETECTED NOT DETECTED Final   Parainfluenza Virus 1 NOT DETECTED NOT DETECTED Final   Parainfluenza Virus 2 NOT DETECTED NOT  DETECTED Final   Parainfluenza Virus 3 NOT DETECTED NOT  DETECTED Final   Parainfluenza Virus 4 NOT DETECTED NOT DETECTED Final   Respiratory Syncytial Virus NOT DETECTED NOT DETECTED Final   Bordetella pertussis NOT DETECTED NOT DETECTED Final   Bordetella Parapertussis NOT DETECTED NOT DETECTED Final   Chlamydophila pneumoniae NOT DETECTED NOT DETECTED Final   Mycoplasma pneumoniae NOT DETECTED NOT DETECTED Final    Comment: Performed at [facility], [address], [place], [place] [address]         Radiology Studies: DG CHEST PORT 1 VIEW  Result Date: 01/20/2023 CLINICAL DATA:  77 year old female with history of sepsis. EXAM: PORTABLE CHEST 1 VIEW COMPARISON:  Chest x-ray 01/18/2023. FINDINGS: Lung volumes are normal. No consolidative airspace disease. No pleural effusions. No pneumothorax. No pulmonary nodule or mass noted. Pulmonary vasculature and the cardiomediastinal silhouette are within normal limits. Atherosclerosis in the thoracic aorta. IMPRESSION: 1.  No radiographic evidence of acute cardiopulmonary disease. 2. Aortic atherosclerosis. Electronically Signed   By: Trudie Reed M.D.   On: 01/20/2023 06:10   VAS [name] LOWER EXTREMITY VENOUS (DVT)  Result Date: 01/19/2023  Lower Venous DVT Study Patient Name:  ALYSSHA HOUSH  Date of Exam:   01/19/2023 Medical Rec #: [id number]            Accession #:    [id number] Date of Birth: April 16, 1946           Patient Gender: F Patient Age:   4 years Exam Location:  [facility] Procedure:      VAS [name] LOWER EXTREMITY VENOUS (DVT) Referring Phys: Carlin Mamone POWELL JR --------------------------------------------------------------------------------  Indications: Swelling.  Performing Technologist: Marilynne Halsted RDMS, RVT  Examination Guidelines: Trellis Vanoverbeke complete evaluation includes B-mode imaging, spectral Doppler, color Doppler, and power Doppler as needed of all accessible portions of each vessel. Bilateral testing is considered an  integral part of Ivet Guerrieri complete examination. Limited examinations for reoccurring indications may be performed as noted. The reflux portion of the exam is performed with the patient in reverse Trendelenburg.  +-----+---------------+---------+-----------+----------+--------------+ RIGHTCompressibilityPhasicitySpontaneityPropertiesThrombus Aging +-----+---------------+---------+-----------+----------+--------------+ CFV  Full           Yes      Yes                                 +-----+---------------+---------+-----------+----------+--------------+ SFJ  Full                                                        +-----+---------------+---------+-----------+----------+--------------+   +---------+---------------+---------+-----------+----------+--------------+ LEFT     CompressibilityPhasicitySpontaneityPropertiesThrombus Aging +---------+---------------+---------+-----------+----------+--------------+ CFV      None           No       No                   Acute          +---------+---------------+---------+-----------+----------+--------------+ SFJ      None                                         Acute          +---------+---------------+---------+-----------+----------+--------------+ FV Prox  None  Acute          +---------+---------------+---------+-----------+----------+--------------+ FV Mid   None           No       No                   Acute          +---------+---------------+---------+-----------+----------+--------------+ FV DistalNone                                         Acute          +---------+---------------+---------+-----------+----------+--------------+ PFV      None                                         Acute          +---------+---------------+---------+-----------+----------+--------------+ POP      None           No       No                   Acute           +---------+---------------+---------+-----------+----------+--------------+ PTV      None                                         Acute          +---------+---------------+---------+-----------+----------+--------------+ PERO     None                                         Acute          +---------+---------------+---------+-----------+----------+--------------+ Unable to evaluate iliac veins.    Summary: RIGHT: - No evidence of common femoral vein obstruction.   LEFT: - Findings consistent with acute deep vein thrombosis involving the left common femoral vein, SF junction, left femoral vein, left proximal profunda vein, left popliteal vein, left posterior tibial veins, and left peroneal veins.   *See table(s) above for measurements and observations. Electronically signed by Sherald Hess MD on 01/19/2023 at 3:51:26 PM.    Final         Scheduled Meds:  insulin aspart  0-9 Units Subcutaneous Q4H   silver sulfADIAZINE  1 Application Topical Daily   [START ON 01/27/2023] thiamine (VITAMIN B1) injection  100 mg Intravenous Daily   Continuous Infusions:  cefTRIAXone (ROCEPHIN)  IV 2 g (01/20/23 0855)   dextrose 100 mL/hr at 01/20/23 2158   heparin 400 Units/hr (01/21/23 0415)   thiamine (VITAMIN B1) injection 500 mg (01/21/23 0554)   Followed by   Melene Muller ON 01/22/2023] thiamine (VITAMIN B1) injection       LOS: 3 days    Time spent: over 30 min    Lacretia Nicks, MD [facility]   To contact the attending provider between 7A-7P or the covering provider during after hours 7P-7A, please log into the web site [email or web address] and access using universal [place] password for that web site. If you do not have the password, please call the hospital operator.  01/21/2023, 8:44 AM

## 2023-01-22 DIAGNOSIS — G9341 Metabolic encephalopathy: Secondary | ICD-10-CM | POA: Diagnosis not present

## 2023-01-22 LAB — COMPREHENSIVE METABOLIC PANEL
ALT: 29 U/L (ref 0–44)
AST: 38 U/L (ref 15–41)
Albumin: 2.2 g/dL — ABNORMAL LOW (ref 3.5–5.0)
Alkaline Phosphatase: 51 U/L (ref 38–126)
Anion gap: 15 (ref 5–15)
BUN: 8 mg/dL (ref 8–23)
CO2: 23 mmol/L (ref 22–32)
Calcium: 8 mg/dL — ABNORMAL LOW (ref 8.9–10.3)
Chloride: 106 mmol/L (ref 98–111)
Creatinine, Ser: 0.64 mg/dL (ref 0.44–1.00)
GFR, Estimated: >60 mL/min (ref >60)
Glucose, Bld: 103 mg/dL — ABNORMAL HIGH (ref 70–99)
Potassium: 3.4 mmol/L — ABNORMAL LOW (ref 3.5–5.1)
Sodium: 144 mmol/L (ref 135–145)
Total Bilirubin: 0.2 mg/dL — ABNORMAL LOW (ref 0.3–1.2)
Total Protein: 6.2 g/dL — ABNORMAL LOW (ref 6.5–8.1)

## 2023-01-22 LAB — CBC WITH DIFFERENTIAL/PLATELET
Abs Immature Granulocytes: 0.51 10*3/uL — ABNORMAL HIGH (ref 0.00–0.07)
Basophils Absolute: 0 10*3/uL (ref 0.0–0.1)
Basophils Relative: 0 %
Eosinophils Absolute: 0 10*3/uL (ref 0.0–0.5)
Eosinophils Relative: 0 %
HCT: 35.5 % — ABNORMAL LOW (ref 36.0–46.0)
Hemoglobin: 10.7 g/dL — ABNORMAL LOW (ref 12.0–15.0)
Immature Granulocytes: 5 %
Lymphocytes Relative: 11 %
Lymphs Abs: 1.2 10*3/uL (ref 0.7–4.0)
MCH: 25.3 pg — ABNORMAL LOW (ref 26.0–34.0)
MCHC: 30.1 g/dL (ref 30.0–36.0)
MCV: 83.9 fL (ref 80.0–100.0)
Monocytes Absolute: 1.3 10*3/uL — ABNORMAL HIGH (ref 0.1–1.0)
Monocytes Relative: 12 %
Neutro Abs: 7.8 10*3/uL — ABNORMAL HIGH (ref 1.7–7.7)
Neutrophils Relative %: 72 %
Platelets: 194 10*3/uL (ref 150–400)
RBC: 4.23 MIL/uL (ref 3.87–5.11)
RDW: 15.5 % (ref 11.5–15.5)
WBC: 10.8 10*3/uL — ABNORMAL HIGH (ref 4.0–10.5)
nRBC: 0 % (ref 0.0–0.2)

## 2023-01-22 LAB — HEPARIN LEVEL (UNFRACTIONATED)
Heparin Unfractionated: >1.1 [IU]/mL — ABNORMAL HIGH (ref 0.30–0.70)
Heparin Unfractionated: 0.13 [IU]/mL — ABNORMAL LOW (ref 0.30–0.70)

## 2023-01-22 LAB — PHOSPHORUS: Phosphorus: 1.9 mg/dL — ABNORMAL LOW (ref 2.5–4.6)

## 2023-01-22 LAB — GLUCOSE, CAPILLARY
Glucose-Capillary: 101 mg/dL — ABNORMAL HIGH (ref 70–99)
Glucose-Capillary: 75 mg/dL (ref 70–99)
Glucose-Capillary: 93 mg/dL (ref 70–99)
Glucose-Capillary: 95 mg/dL (ref 70–99)
Glucose-Capillary: 95 mg/dL (ref 70–99)

## 2023-01-22 LAB — MAGNESIUM: Magnesium: 1.5 mg/dL — ABNORMAL LOW (ref 1.7–2.4)

## 2023-01-22 LAB — LIPASE, BLOOD: Lipase: 121 U/L — ABNORMAL HIGH (ref 11–51)

## 2023-01-22 MED ORDER — POTASSIUM PHOSPHATES 15 MMOLE/5ML IV SOLN
15.0000 mmol | Freq: Once | INTRAVENOUS | Status: AC
  Administered 2023-01-23: 15 mmol via INTRAVENOUS
  Filled 2023-01-22: qty 5

## 2023-01-22 MED ORDER — MAGNESIUM SULFATE 2 GM/50ML IV SOLN
2.0000 g | Freq: Once | INTRAVENOUS | Status: AC
  Administered 2023-01-23: 2 g via INTRAVENOUS
  Filled 2023-01-22: qty 50

## 2023-01-22 NOTE — Progress Notes (Addendum)
PHARMACY - ANTICOAGULATION   Pharmacy Consult for heparin Indication: LLE DVT  No Known Allergies  Patient Measurements: Weight: 58.1 kg (128 lb) Heparin Dosing Weight: 58.1kg  Vital Signs: Temp: 98.1 F (36.7 C) (10/05 0746) Temp Source: Oral (10/05 0746) BP: 115/96 (10/05 0746) Pulse Rate: 89 (10/05 0746)  Labs: Recent Labs    01/20/23 0355 01/20/23 1423 01/21/23 0440 01/21/23 1239 01/21/23 2043 01/22/23 1225 01/22/23 1501  HGB 10.4*  --  10.9*  --   --  10.7*  --   HCT 34.4*  --  36.2  --   --  35.5*  --   PLT 170  --  185  --   --  194  --   HEPARINUNFRC >1.10*   < >  --    < > 0.15* >1.10* 0.13*  CREATININE 1.35*   < > 0.84  --  0.71 0.64  --    < > = values in this interval not displayed.    Estimated Creatinine Clearance: 53 mL/min (by C-G formula based on SCr of 0.64 mg/dL).  Assessment: 77 y.o. female with new LLE DVT for heparin. No AC prior to admission.  0600 HL was drawn late at 1349 after reaching out to phlebotomy multiple times.  1349 HL was supratherapeutic (unknown what arm it was drawn from), so repeat level was obtained from opposite arm of heparin infusion. Repeat level subtherapeutic (0.13) on 500 units/hr. Patient was previously supratherapeutic on 600 units/hr and has had to have heparin held multiple times at the beginning of therapy for continual supratherapeutic levels. Seems very sensitive to heparin so will only slightly bump up gtt rate.  CBC stable, no s/sx of bleeding per RN. RN noted there was a kink in the line when they came in this morning, which was adjusted at that time. Heparin has been running without issues since then. RN also noted saline is being infused with the heparin.  Goal of Therapy:  Heparin level 0.3-0.7 units/ml Monitor platelets by anticoagulation protocol: Yes   Plan:  Increase heparin to 550 units/hr F/u 8hr heparin level Daily heparin level, CBC, s/sx of bleeding F/u [facility] plans once no longer NPO  Nicole Kindred, PharmD PGY1 Pharmacy Resident 01/22/2023 4:04 PM

## 2023-01-22 NOTE — Progress Notes (Signed)
PROGRESS NOTE    Amanda [name]  OAC:[id number] DOB: [birth year] DOA: 01/18/2023 PCP: [name], MD  Chief Complaint  Patient presents with   Altered Mental Status   Hypertension    Brief Narrative:   Amanda [name] is Amanda [name] 77 y.o. female with medical history significant of moderate dementia who presents with progressive AMS and poor PO intake.    She's been admitted with AKI, hypernatremia, and AMS.   Assessment & Plan:   Principal Problem:   Acute metabolic encephalopathy Active Problems:   AKI (acute kidney injury) (HCC)   Hypernatremia   SIRS (systemic inflammatory response syndrome) (HCC)   Decubitus ulcer   Hypertension   Moderate dementia (HCC)  Goals of Care Patient remains critically ill with persistent encephalopathy. Her overall prognosis is poor.  With improvement in her creatinine/bun, her mental status has not significantly improved.  Has chronic dementia.  I doubt her ability to take adequate PO safely.  Discussed with husband 10/2, he wants full code at this time.  Discussed additionally 10/3, I recommended involvement of palliative care to help with continued goc conversations.  Plan for family meeting on 10/6, appreciate palliative care assistance.      Acute metabolic encephalopathy Dementia  Dementia for years.  at baseline, wheelchair bound (walked 80 ft with walker with assistance in early September at rehab - wheelchair bound since being home), she's disoriented typically, but will communicate.   Likely multifactorial (hypernatremia, uremia/AKI, dementia) but inciting cause unclear.  Not able to discuss with family yet today to discuss her baseline and timing for this decline. No clear infectious cause (CXR without active disease, CT maxillofacial without notable findings, CT abd/pelvis without acute findings, decub doesn't appear infected) TSH wnl, ammonia wnl  B12 (elevated), folate low -> supplement EEG with moderate to severe diffuse  encephalopathy, no seizures or epileptiform discharges CT head without acute abnormality MRI brain pending  Treating hypernatremia/AKI (significantly improved) Consider additional workup/imaging - she remains altered, unable to follow commands or communicate    SIRS (systemic inflammatory response syndrome) (HCC) Initial tachycardia, WBC 17k.  Question of SIRS / Sepsis with unclear source: UA neg No obvious dental infection on my exam Decubitus ulcer is at least stage 2, but no obvious erythema / drainage S/p cefepime and vanc in ED CT maxilofacial to r/o dental abscess -> without acute findings CT abd pelvis with large stool burden, air within bladder Repeat CXR 10/3 -> without acute cardiopulm dz  Blood cultures NGx4 Urine cx no growth  Covid and RVP negative Will continue ceftriaxone empirically (7 days) for now, broaden as indicated    Dysphagia Not safe for PO at this time given AMS - daughter asked about TPN -> discussed we'd prefer enteral nutrition since no contraindication, if artificial nutrition pursued, would probably recommend tube feeds via cortrak (would need to discuss whether time limited trial).  My preference would be to avoid feeding tube with her history, will discuss additionally.   SLP recommending NPO   Hypernatremia Severe dehydration due to decreased PO intake. Improved - labs pending today   AKI  likely pre-renal with hx poor PO intake Baseline creatinine 0.69 Significantly improved - labs pending today CT without hydro    DVT Extensive --> [name] with DVT involving L common femoral vein, SF junction, L femoral vein, L proximal profunda vein, L popliteal vein, L posterior tibial veins, and L peroneal veins Heparin gtt  Elevated Lipase Unclear significance - CT without findings c/w pancreatitis,  not significantly tender on exam Trend   Decubitus ulcer Wound care consult -> appreciate recs No obvious erythema, drainage, crepitus.    Hypertension Chart diagnosis, doesn't appear to be on any chronic meds for this. BP appropriate without meds     DVT prophylaxis: heparin gtt Code Status: full Family Communication: husband, daughter 10/4 (none at bedside 10/5)  Disposition:   Status is: Inpatient Remains inpatient appropriate because: need for continued inpatient care   Consultants:  Palliative care  Procedures:  LE [name] LEFT:  - Findings consistent with acute deep vein thrombosis involving the left  common femoral vein, SF junction, left femoral vein, left proximal  profunda vein, left popliteal vein, left posterior tibial veins, and left  peroneal veins.   Antimicrobials:  Anti-infectives (From admission, onward)    Start     Dose/Rate Route Frequency Ordered Stop   01/19/23 1900  ceFEPIme (MAXIPIME) 2 g in sodium chloride 0.9 % 100 mL IVPB  Status:  Discontinued        2 g 200 mL/hr over 30 Minutes Intravenous Every 24 hours 01/19/23 0338 01/19/23 0945   01/19/23 1000  cefTRIAXone (ROCEPHIN) 2 g in sodium chloride 0.9 % 100 mL IVPB       Note to Pharmacy: Retime as indicated   2 g 200 mL/hr over 30 Minutes Intravenous Every 24 hours 01/19/23 0945 01/26/23 0959   01/19/23 0342  vancomycin variable dose per unstable renal function (pharmacist dosing)  Status:  Discontinued         Does not apply See admin instructions 01/19/23 0343 01/20/23 0718   01/18/23 1830  vancomycin (VANCOREADY) IVPB 1250 mg/250 mL        1,250 mg 166.7 mL/hr over 90 Minutes Intravenous  Once 01/18/23 1818 01/18/23 2122   01/18/23 1815  ceFEPIme (MAXIPIME) 2 g in sodium chloride 0.9 % 100 mL IVPB        2 g 200 mL/hr over 30 Minutes Intravenous  Once 01/18/23 1807 01/18/23 1911       Subjective: Unintelligible speech   Objective: Vitals:   01/21/23 1935 01/21/23 2300 01/22/23 0330 01/22/23 0746  BP: 110/82 (!) 134/59 124/80 (!) 115/96  Pulse:  91 77 89  Resp: 17 16 20 20   Temp: 98.2 F (36.8 C) 98 F (36.7 C) 98.2  F (36.8 C) 98.1 F (36.7 C)  TempSrc: Oral Oral Oral Oral  SpO2: 98% 100% 99% 100%  Weight:        Intake/Output Summary (Last 24 hours) at 01/22/2023 0830 Last data filed at 01/22/2023 0630 Gross per 24 hour  Intake 1793.8 ml  Output 1600 ml  Net 193.8 ml   Filed Weights   01/19/23 0315  Weight: 58.1 kg    Examination:  General: frail, chronically ill appearing Cardiovascular: RRR Lungs: unlabored Abdomen: Soft, nontender, nondistended  Neurological: unintelligible speech - not following commands Extremities: No clubbing or cyanosis. No edema.   Data Reviewed: I have personally reviewed following labs and imaging studies  CBC: Recent Labs  Lab 01/18/23 1848 01/19/23 0715 01/20/23 0355 01/21/23 0440  WBC 17.8* 16.3* 13.5* 11.3*  NEUTROABS 15.3*  --  10.4* 8.4*  HGB 13.2 10.5* 10.4* 10.9*  HCT 44.2 36.0 34.4* 36.2  MCV 86.3 87.0 84.1 87.2  PLT 209 162 170 185    Basic Metabolic Panel: Recent Labs  Lab 01/19/23 1846 01/20/23 0355 01/20/23 2024 01/21/23 0440 01/21/23 2043  NA 153* 149* 148* 142 143  K 3.7 3.3* 4.0 3.4* 4.0  CL 114* 111 109 107 108  CO2 25 23 21* 25 23  GLUCOSE 119* 155* 100* 109* 109*  BUN 109* 93* 59* 43* 22  CREATININE 1.70* 1.35* 1.05* 0.84 0.71  CALCIUM 8.7* 8.3* 8.6* 8.2* 8.1*  MG  --  2.6*  --  2.2  --   PHOS  --  2.2*  --  1.7*  --     GFR: Estimated Creatinine Clearance: 53 mL/min (by C-G formula based on SCr of 0.71 mg/dL).  Liver Function Tests: Recent Labs  Lab 01/18/23 1848 01/20/23 0355 01/21/23 0440  AST 48* 38 64*  ALT 30 26 36  ALKPHOS 64 47 55  BILITOT 0.4 0.6 0.7  PROT 8.4* 6.6 6.9  ALBUMIN 3.3* 2.5* 2.6*    CBG: Recent Labs  Lab 01/21/23 1603 01/21/23 1933 01/21/23 2310 01/22/23 0335 01/22/23 0749  GLUCAP 98 129* 101* 101* 95     Recent Results (from the past 240 hour(s))  Blood culture (routine x 2)     Status: None (Preliminary result)   Collection Time: 01/18/23  6:48 PM   Specimen:  BLOOD RIGHT ARM  Result Value Ref Range Status   Specimen Description BLOOD RIGHT ARM  Final   Special Requests   Final    BOTTLES DRAWN AEROBIC AND ANAEROBIC Blood Culture adequate volume   Culture   Final    NO GROWTH 4 DAYS Performed at [facility], [address], [place], [place] [address]    Report Status PENDING  Incomplete  Blood culture (routine x 2)     Status: None (Preliminary result)   Collection Time: 01/18/23  6:49 PM   Specimen: BLOOD RIGHT ARM  Result Value Ref Range Status   Specimen Description BLOOD RIGHT ARM  Final   Special Requests   Final    BOTTLES DRAWN AEROBIC AND ANAEROBIC Blood Culture adequate volume   Culture   Final    NO GROWTH 4 DAYS Performed at [facility], [address], [place], [place] [address]    Report Status PENDING  Incomplete  Urine Culture     Status: None   Collection Time: 01/18/23  7:07 PM   Specimen: Urine, Clean Catch  Result Value Ref Range Status   Specimen Description URINE, CLEAN CATCH  Final   Special Requests NONE  Final   Culture   Final    NO GROWTH Performed at [facility], [address], [place], [place] [address]    Report Status 01/20/2023 FINAL  Final  SARS Coronavirus 2 by RT PCR (hospital order, performed in [facility] hospital lab) *cepheid single result test* Anterior Nasal Swab     Status: None   Collection Time: 01/18/23 11:09 PM   Specimen: Anterior Nasal Swab  Result Value Ref Range Status   SARS Coronavirus 2 by RT PCR NEGATIVE NEGATIVE Final    Comment: Performed at [facility], [address] [place], [place] [address]  Respiratory (~20 pathogens) panel by PCR     Status: None   Collection Time: 01/18/23 11:09 PM   Specimen: Anterior Nasal Swab; Respiratory  Result Value Ref Range Status   Adenovirus NOT DETECTED NOT DETECTED Final   Coronavirus 229E NOT DETECTED NOT DETECTED Final    Comment: (NOTE) The Coronavirus on the Respiratory Panel, DOES NOT test for the  novel  Coronavirus (2019 nCoV)    Coronavirus HKU1 NOT DETECTED NOT DETECTED Final   Coronavirus NL63 NOT DETECTED NOT  DETECTED Final   Coronavirus OC43 NOT DETECTED NOT DETECTED Final   Metapneumovirus NOT DETECTED NOT DETECTED Final   Rhinovirus / Enterovirus NOT DETECTED NOT DETECTED Final   Influenza Kamali Sakata NOT DETECTED NOT DETECTED Final   Influenza B NOT DETECTED NOT DETECTED Final   Parainfluenza Virus 1 NOT DETECTED NOT DETECTED Final   Parainfluenza Virus 2 NOT DETECTED NOT DETECTED Final   Parainfluenza Virus 3 NOT DETECTED NOT DETECTED Final   Parainfluenza Virus 4 NOT DETECTED NOT DETECTED Final   Respiratory Syncytial Virus NOT DETECTED NOT DETECTED Final   Bordetella pertussis NOT DETECTED NOT DETECTED Final   Bordetella Parapertussis NOT DETECTED NOT DETECTED Final   Chlamydophila pneumoniae NOT DETECTED NOT DETECTED Final   Mycoplasma pneumoniae NOT DETECTED NOT DETECTED Final    Comment: Performed at [facility], [address] [place], [place] [address]         Radiology Studies: EEG adult  Result Date: February 08, 2023 Charlsie Quest, MD     02-08-23 10:26 AM Patient Name: Amanda [name] MRN: [id number] Epilepsy Attending: Charlsie Quest Referring Physician/Provider: Zigmund Daniel., MD Date: February 08, 2023 Duration: 24.06 mins Patient history: 77yo F with ams getting eeg to evaluate for seizure Level of alertness: Awake AEDs during EEG study: None Technical aspects: This EEG study was done with scalp electrodes positioned according to the 10-20 International system of electrode placement. Electrical activity was reviewed with band pass filter of 1-70Hz , sensitivity of 7 uV/mm, display speed of 29mm/sec with Brian Kocourek 60Hz  notched filter applied as appropriate. EEG data were recorded continuously and digitally stored.  Video monitoring was available and reviewed as appropriate. Description: EEG showed continuous generalized 3 to 6 Hz theta-delta slowing.  Hyperventilation and photic stimulation were not performed.   ABNORMALITY - Continuous slow, generalized IMPRESSION: This study is suggestive of moderate to severe diffuse encephalopathy. No seizures or epileptiform discharges were seen throughout the recording. Priyanka Annabelle Harman        Scheduled Meds:  folic acid  1 mg Intravenous Daily   silver sulfADIAZINE  1 Application Topical Daily   [START ON 01/27/2023] thiamine (VITAMIN B1) injection  100 mg Intravenous Daily   Continuous Infusions:  cefTRIAXone (ROCEPHIN)  IV Stopped (February 08, 2023 0952)   dextrose 50 mL/hr at 01/22/23 0125   dextrose 5% lactated ringers 75 mL/hr at 01/22/23 0125   heparin 500 Units/hr (2023-02-08 2300)   thiamine (VITAMIN B1) injection       LOS: 4 days    Time spent: over 30 min    Lacretia Nicks, MD [facility]   To contact the attending provider between 7A-7P or the covering provider during after hours 7P-7A, please log into the web site [email or web address] and access using universal [place] password for that web site. If you do not have the password, please call the hospital operator.  01/22/2023, 8:30 AM

## 2023-01-22 NOTE — Progress Notes (Signed)
Speech Language Pathology Treatment: Dysphagia  Patient Details Name: Amanda [name] MRN: [id number] DOB: [birth year] Today's Date: 01/22/2023 Time: 1015-1030 SLP Time Calculation (min) (ACUTE ONLY): 15 min  Assessment / Plan / Recommendation Clinical Impression  Patient seen by SLP for skilled treatment focused on dysphagia goals. Patient was awake and alert and although she would refuse at times, she allowed SLP to perform extensive oral care. SLP removed dried secretions, including several dried clumps from hard and soft palate, lower teeth and tongue. Patient then did have instance of coughing with movement of thick clump of secretions into oral cavity which SLP then suctioned out. After oral care, SLP administered small ice chips (total of 4) one at a time. Patient exhibited light mastication of ice chips followed by initially no observed swallow which improved to delayed swallow initiation. No overt s/s aspiration observed, however patient is at high risk for aspiration. SLP is recommending continue NPO status but allow ice chips (small, given one at a time, no more than 5 ice chips each time and total of 3-4 times per day. SLP will continue to follow.     HPI HPI: Amanda [name] is a 77 yo female presenting to ED 10/1 with worsening AMS and poor PO intake. Admitted with AKI and hypernatremia. Seen by SLP June 2024 with cognitively based dysphagia c/b sustained attention deficits affecting mastication and oral transit. Per notes at that time, pt orally holds foods she does not like and her husband is typically present to order desired food choices and provide careful hand feeding. PMH includes moderate dementia      SLP Plan  Continue with current plan of care      Recommendations for follow up therapy are one component of a multi-disciplinary discharge planning process, led by the attending physician.  Recommendations may be updated based on patient status, additional functional  criteria and insurance authorization.    Recommendations  Diet recommendations: NPO Medication Administration: Via alternative means                  Oral care QID;Staff/trained caregiver to provide oral care;Oral care prior to ice chip/H20     Dysphagia, unspecified (R13.10)     Continue with current plan of care    Angela Nevin, MA, CCC-SLP Speech Therapy

## 2023-01-23 ENCOUNTER — Inpatient Hospital Stay (HOSPITAL_COMMUNITY): Payer: PPO

## 2023-01-23 DIAGNOSIS — Z515 Encounter for palliative care: Secondary | ICD-10-CM

## 2023-01-23 DIAGNOSIS — I700 Atherosclerosis of aorta: Secondary | ICD-10-CM | POA: Diagnosis not present

## 2023-01-23 DIAGNOSIS — K449 Diaphragmatic hernia without obstruction or gangrene: Secondary | ICD-10-CM | POA: Diagnosis not present

## 2023-01-23 DIAGNOSIS — R4182 Altered mental status, unspecified: Secondary | ICD-10-CM | POA: Diagnosis not present

## 2023-01-23 DIAGNOSIS — G9341 Metabolic encephalopathy: Secondary | ICD-10-CM | POA: Diagnosis not present

## 2023-01-23 DIAGNOSIS — Z7189 Other specified counseling: Secondary | ICD-10-CM | POA: Diagnosis not present

## 2023-01-23 DIAGNOSIS — I2510 Atherosclerotic heart disease of native coronary artery without angina pectoris: Secondary | ICD-10-CM | POA: Diagnosis not present

## 2023-01-23 LAB — CULTURE, BLOOD (ROUTINE X 2)
Culture: NO GROWTH
Culture: NO GROWTH
Special Requests: ADEQUATE
Special Requests: ADEQUATE

## 2023-01-23 LAB — BASIC METABOLIC PANEL
Anion gap: 11 (ref 5–15)
Anion gap: 16 — ABNORMAL HIGH (ref 5–15)
BUN: 6 mg/dL — ABNORMAL LOW (ref 8–23)
BUN: <5 mg/dL — ABNORMAL LOW (ref 8–23)
CO2: 24 mmol/L (ref 22–32)
CO2: 23 mmol/L (ref 22–32)
Calcium: 8.1 mg/dL — ABNORMAL LOW (ref 8.9–10.3)
Calcium: 8.2 mg/dL — ABNORMAL LOW (ref 8.9–10.3)
Chloride: 109 mmol/L (ref 98–111)
Chloride: 105 mmol/L (ref 98–111)
Creatinine, Ser: 0.62 mg/dL (ref 0.44–1.00)
Creatinine, Ser: 0.57 mg/dL (ref 0.44–1.00)
GFR, Estimated: >60 mL/min (ref >60)
GFR, Estimated: >60 mL/min (ref >60)
Glucose, Bld: 92 mg/dL (ref 70–99)
Glucose, Bld: 113 mg/dL — ABNORMAL HIGH (ref 70–99)
Potassium: 3.7 mmol/L (ref 3.5–5.1)
Potassium: 3.4 mmol/L — ABNORMAL LOW (ref 3.5–5.1)
Sodium: 144 mmol/L (ref 135–145)
Sodium: 144 mmol/L (ref 135–145)

## 2023-01-23 LAB — GLUCOSE, CAPILLARY
Glucose-Capillary: 73 mg/dL (ref 70–99)
Glucose-Capillary: 110 mg/dL — ABNORMAL HIGH (ref 70–99)
Glucose-Capillary: 95 mg/dL (ref 70–99)
Glucose-Capillary: 83 mg/dL (ref 70–99)
Glucose-Capillary: 90 mg/dL (ref 70–99)

## 2023-01-23 LAB — CBC
HCT: 36.7 % (ref 36.0–46.0)
Hemoglobin: 11.2 g/dL — ABNORMAL LOW (ref 12.0–15.0)
MCH: 25.5 pg — ABNORMAL LOW (ref 26.0–34.0)
MCHC: 30.5 g/dL (ref 30.0–36.0)
MCV: 83.4 fL (ref 80.0–100.0)
Platelets: 252 10*3/uL (ref 150–400)
RBC: 4.4 MIL/uL (ref 3.87–5.11)
RDW: 15.6 % — ABNORMAL HIGH (ref 11.5–15.5)
WBC: 12.8 10*3/uL — ABNORMAL HIGH (ref 4.0–10.5)
nRBC: 0 % (ref 0.0–0.2)

## 2023-01-23 LAB — HEPARIN LEVEL (UNFRACTIONATED)
Heparin Unfractionated: 0.24 [IU]/mL — ABNORMAL LOW (ref 0.30–0.70)
Heparin Unfractionated: 0.15 [IU]/mL — ABNORMAL LOW (ref 0.30–0.70)

## 2023-01-23 LAB — PHOSPHORUS: Phosphorus: 1.9 mg/dL — ABNORMAL LOW (ref 2.5–4.6)

## 2023-01-23 LAB — MAGNESIUM: Magnesium: 2 mg/dL (ref 1.7–2.4)

## 2023-01-23 MED ORDER — ENOXAPARIN SODIUM 100 MG/ML IJ SOSY
90.0000 mg | PREFILLED_SYRINGE | INTRAMUSCULAR | Status: DC
  Administered 2023-01-23 – 2023-01-29 (×7): 90 mg via SUBCUTANEOUS
  Filled 2023-01-23 (×7): qty 1

## 2023-01-23 MED ORDER — POTASSIUM PHOSPHATES 15 MMOLE/5ML IV SOLN
15.0000 mmol | Freq: Once | INTRAVENOUS | Status: AC
  Administered 2023-01-23: 15 mmol via INTRAVENOUS
  Filled 2023-01-23: qty 5

## 2023-01-23 MED ORDER — ENOXAPARIN SODIUM 100 MG/ML IJ SOSY: 1.5000 mg/kg | PREFILLED_SYRINGE | INTRAMUSCULAR | Status: DC

## 2023-01-23 NOTE — Progress Notes (Signed)
PROGRESS NOTE    Amanda STAMMEN  ZOX:[id number] DOB: [birth year] DOA: 01/18/2023 PCP: [name], MD  Chief Complaint  Patient presents with   Altered Mental Status   Hypertension    Brief Narrative:   Amanda [name] is [name] 77 y.o. female with medical history significant of moderate dementia who presents with progressive AMS and poor PO intake.    She's been admitted with AKI, hypernatremia, and AMS.   Assessment & Plan:   Principal Problem:   Acute metabolic encephalopathy Active Problems:   AKI (acute kidney injury) (HCC)   Hypernatremia   SIRS (systemic inflammatory response syndrome) (HCC)   Decubitus ulcer   Hypertension   Moderate dementia (HCC)  Goals of Care Patient remains critically ill with persistent encephalopathy. Her overall prognosis is poor.  With improvement in her creatinine/bun, her mental status has not significantly improved.  Has chronic dementia.  I doubt her ability to take adequate PO safely.  Discussed with husband 10/2, he wants full code at this time.  Discussed additionally 10/3, I recommended involvement of palliative care to help with continued goc conversations.  Plan for family meeting on 10/6, appreciate palliative care assistance.      Acute metabolic encephalopathy Dementia  Dementia for years.  at baseline, wheelchair bound (walked 80 ft with walker with assistance in early September at rehab - wheelchair bound since being home), she's disoriented typically, but will communicate.   Likely multifactorial (hypernatremia, uremia/AKI, dementia) but inciting cause unclear.  Not able to discuss with family yet today to discuss her baseline and timing for this decline. No clear infectious cause (CXR without active disease, CT maxillofacial without notable findings, CT abd/pelvis without acute findings, decub doesn't appear infected) TSH wnl, ammonia wnl  B12 (elevated), folate low -> supplement EEG with moderate to severe diffuse  encephalopathy, no seizures or epileptiform discharges CT head without acute abnormality MRI brain pending  Treating hypernatremia/AKI (significantly improved) Consider additional workup/imaging - she remains altered, unable to follow commands or communicate    SIRS (systemic inflammatory response syndrome) (HCC) Initial tachycardia, WBC 17k.  Question of SIRS / Sepsis with unclear source: UA neg No obvious dental infection on my exam Decubitus ulcer is at least stage 2, but no obvious erythema / drainage S/p cefepime and vanc in ED CT maxilofacial to r/o dental abscess -> without acute findings CT abd pelvis with large stool burden, air within bladder Repeat CXR 10/3 -> without acute cardiopulm dz  Blood cultures NGx4 Urine cx no growth  Covid and RVP negative Will continue ceftriaxone empirically (7 days) for now, broaden as indicated    Dysphagia Not safe for PO at this time given AMS - daughter asked about TPN -> discussed we'd prefer enteral nutrition since no contraindication, if artificial nutrition pursued, would probably recommend tube feeds via cortrak (would need to discuss whether time limited trial).  My preference would be to avoid feeding tube with her history, will discuss additionally.   SLP recommending NPO   Hypernatremia Severe dehydration due to decreased PO intake. Improved    AKI  likely pre-renal with hx poor PO intake Baseline creatinine 0.69 Significantly improved CT without hydro   Hypoglycemia Continue D10 fluids    DVT Extensive --> [name] with DVT involving L common femoral vein, SF junction, L femoral vein, L proximal profunda vein, L popliteal vein, L posterior tibial veins, and L peroneal veins Heparin gtt -> transition to lovenox today  Elevated Lipase Unclear significance - CT  without findings c/w pancreatitis, not significantly tender on exam Trend   Decubitus ulcer Wound care consult -> appreciate recs No obvious erythema, drainage,  crepitus.   Hypertension Chart diagnosis, doesn't appear to be on any chronic meds for this. BP appropriate without meds     DVT prophylaxis: heparin gtt Code Status: full Family Communication: husband, daughter 10/4 (none at bedside 10/5 or 10/6)  Disposition:   Status is: Inpatient Remains inpatient appropriate because: need for continued inpatient care   Consultants:  Palliative care  Procedures:  LE [name] LEFT:  - Findings consistent with acute deep vein thrombosis involving the left  common femoral vein, SF junction, left femoral vein, left proximal  profunda vein, left popliteal vein, left posterior tibial veins, and left  peroneal veins.   Antimicrobials:  Anti-infectives (From admission, onward)    Start     Dose/Rate Route Frequency Ordered Stop   01/19/23 1900  ceFEPIme (MAXIPIME) 2 g in sodium chloride 0.9 % 100 mL IVPB  Status:  Discontinued        2 g 200 mL/hr over 30 Minutes Intravenous Every 24 hours 01/19/23 0338 01/19/23 0945   01/19/23 1000  cefTRIAXone (ROCEPHIN) 2 g in sodium chloride 0.9 % 100 mL IVPB       Note to Pharmacy: Retime as indicated   2 g 200 mL/hr over 30 Minutes Intravenous Every 24 hours 01/19/23 0945 01/26/23 0959   01/19/23 0342  vancomycin variable dose per unstable renal function (pharmacist dosing)  Status:  Discontinued         Does not apply See admin instructions 01/19/23 0343 01/20/23 0718   01/18/23 1830  vancomycin (VANCOREADY) IVPB 1250 mg/250 mL        1,250 mg 166.7 mL/hr over 90 Minutes Intravenous  Once 01/18/23 1818 01/18/23 2122   01/18/23 1815  ceFEPIme (MAXIPIME) 2 g in sodium chloride 0.9 % 100 mL IVPB        2 g 200 mL/hr over 30 Minutes Intravenous  Once 01/18/23 1807 01/18/23 1911       Subjective: Unintelligible speech   Objective: Vitals:   01/22/23 2320 01/23/23 0319 01/23/23 0729 01/23/23 1254  BP: (!) 144/75 (!) 146/75 (!) 120/55 130/62  Pulse:   97 100  Resp: 19 19  19   Temp: 98 F (36.7 C)  98.1 F (36.7 C) 98.3 F (36.8 C) 97.9 F (36.6 C)  TempSrc: Oral Oral Axillary Oral  SpO2:   100% 100%  Weight:        Intake/Output Summary (Last 24 hours) at 01/23/2023 1347 Last data filed at 01/23/2023 0736 Gross per 24 hour  Intake --  Output 850 ml  Net -850 ml   Filed Weights   01/19/23 0315  Weight: 58.1 kg    Examination:  General: No acute distress. Cardiovascular: RRR Lungs: unlabored Abdomen: Soft, nontender, nondistended Neurological: awake, confused, not following commands, spontaneous unintelligible speech    Data Reviewed: I have personally reviewed following labs and imaging studies  CBC: Recent Labs  Lab 01/18/23 1848 01/19/23 0715 01/20/23 0355 01/21/23 0440 01/22/23 1225 01/23/23 0840  WBC 17.8* 16.3* 13.5* 11.3* 10.8* 12.8*  NEUTROABS 15.3*  --  10.4* 8.4* 7.8*  --   HGB 13.2 10.5* 10.4* 10.9* 10.7* 11.2*  HCT 44.2 36.0 34.4* 36.2 35.5* 36.7  MCV 86.3 87.0 84.1 87.2 83.9 83.4  PLT 209 162 170 185 194 252    Basic Metabolic Panel: Recent Labs  Lab 01/20/23 0355 01/20/23 2024 01/21/23 0440  01/21/23 2043 01/22/23 1225 01/23/23 0101 01/23/23 0840  NA 149* 148* 142 143 144 144  --   K 3.3* 4.0 3.4* 4.0 3.4* 3.7  --   CL 111 109 107 108 106 109  --   CO2 23 21* 25 23 23 24   --   GLUCOSE 155* 100* 109* 109* 103* 92  --   BUN 93* 59* 43* 22 8 6*  --   CREATININE 1.35* 1.05* 0.84 0.71 0.64 0.62  --   CALCIUM 8.3* 8.6* 8.2* 8.1* 8.0* 8.1*  --   MG 2.6*  --  2.2  --  1.5*  --  2.0  PHOS 2.2*  --  1.7*  --  1.9*  --  1.9*    GFR: Estimated Creatinine Clearance: 53 mL/min (by C-G formula based on SCr of 0.62 mg/dL).  Liver Function Tests: Recent Labs  Lab 01/18/23 1848 01/20/23 0355 01/21/23 0440 01/22/23 1225  AST 48* 38 64* 38  ALT 30 26 36 29  ALKPHOS 64 47 55 51  BILITOT 0.4 0.6 0.7 0.2*  PROT 8.4* 6.6 6.9 6.2*  ALBUMIN 3.3* 2.5* 2.6* 2.2*    CBG: Recent Labs  Lab 01/22/23 1726 01/22/23 2330 01/23/23 0428  01/23/23 0741 01/23/23 1259  GLUCAP 75 95 73 110* 95     Recent Results (from the past 240 hour(s))  Blood culture (routine x 2)     Status: None   Collection Time: 01/18/23  6:48 PM   Specimen: BLOOD RIGHT ARM  Result Value Ref Range Status   Specimen Description BLOOD RIGHT ARM  Final   Special Requests   Final    BOTTLES DRAWN AEROBIC AND ANAEROBIC Blood Culture adequate volume   Culture   Final    NO GROWTH 5 DAYS Performed at [facility], [address] [place], [place] [address]    Report Status 01/23/2023 FINAL  Final  Blood culture (routine x 2)     Status: None   Collection Time: 01/18/23  6:49 PM   Specimen: BLOOD RIGHT ARM  Result Value Ref Range Status   Specimen Description BLOOD RIGHT ARM  Final   Special Requests   Final    BOTTLES DRAWN AEROBIC AND ANAEROBIC Blood Culture adequate volume   Culture   Final    NO GROWTH 5 DAYS Performed at [facility], [address] [place], [place] [address]    Report Status 01/23/2023 FINAL  Final  Urine Culture     Status: None   Collection Time: 01/18/23  7:07 PM   Specimen: Urine, Clean Catch  Result Value Ref Range Status   Specimen Description URINE, CLEAN CATCH  Final   Special Requests NONE  Final   Culture   Final    NO GROWTH Performed at [facility], [address] [place], [place] [address]    Report Status 01/20/2023 FINAL  Final  SARS Coronavirus 2 by RT PCR (hospital order, performed in [facility] hospital lab) *cepheid single result test* Anterior Nasal Swab     Status: None   Collection Time: 01/18/23 11:09 PM   Specimen: Anterior Nasal Swab  Result Value Ref Range Status   SARS Coronavirus 2 by RT PCR NEGATIVE NEGATIVE Final    Comment: Performed at [facility], [address], [place], [place] [address]  Respiratory (~20 pathogens) panel by PCR     Status: None   Collection Time: 01/18/23 11:09 PM   Specimen: Anterior  Nasal Swab; Respiratory  Result Value Ref Range  Status   Adenovirus NOT DETECTED NOT DETECTED Final   Coronavirus 229E NOT DETECTED NOT DETECTED Final    Comment: (NOTE) The Coronavirus on the Respiratory Panel, DOES NOT test for the novel  Coronavirus (2019 nCoV)    Coronavirus HKU1 NOT DETECTED NOT DETECTED Final   Coronavirus NL63 NOT DETECTED NOT DETECTED Final   Coronavirus OC43 NOT DETECTED NOT DETECTED Final   Metapneumovirus NOT DETECTED NOT DETECTED Final   Rhinovirus / Enterovirus NOT DETECTED NOT DETECTED Final   Influenza [place] NOT DETECTED NOT DETECTED Final   Influenza B NOT DETECTED NOT DETECTED Final   Parainfluenza Virus 1 NOT DETECTED NOT DETECTED Final   Parainfluenza Virus 2 NOT DETECTED NOT DETECTED Final   Parainfluenza Virus 3 NOT DETECTED NOT DETECTED Final   Parainfluenza Virus 4 NOT DETECTED NOT DETECTED Final   Respiratory Syncytial Virus NOT DETECTED NOT DETECTED Final   Bordetella pertussis NOT DETECTED NOT DETECTED Final   Bordetella Parapertussis NOT DETECTED NOT DETECTED Final   Chlamydophila pneumoniae NOT DETECTED NOT DETECTED Final   Mycoplasma pneumoniae NOT DETECTED NOT DETECTED Final    Comment: Performed at [facility], [address], [place], [place] [address]         Radiology Studies: No results found.      Scheduled Meds:  enoxaparin (LOVENOX) injection  90 mg Subcutaneous Q24H   folic acid  1 mg Intravenous Daily   silver sulfADIAZINE  1 Application Topical Daily   [START ON 01/27/2023] thiamine (VITAMIN B1) injection  100 mg Intravenous Daily   Continuous Infusions:  cefTRIAXone (ROCEPHIN)  IV 2 g (01/23/23 0845)   dextrose 50 mL/hr at 01/22/23 1706   dextrose 5% lactated ringers Stopped (01/23/23 1339)   potassium PHOSPHATE IVPB (in mmol) 15 mmol (01/23/23 1337)   thiamine (VITAMIN B1) injection 250 mg (01/23/23 1332)     LOS: 5 days    Time spent: over 30 min    Lacretia Nicks, MD [facility]   To contact the attending provider between 7A-7P or  the covering provider during after hours 7P-7A, please log into the web site [email or web address] and access using universal Wall password for that web site. If you do not have the password, please call the hospital operator.  01/23/2023, 1:47 PM

## 2023-01-23 NOTE — Progress Notes (Signed)
PHARMACY - ANTICOAGULATION   Pharmacy Consult for heparin > enoxaparin Indication: LLE DVT  No Known Allergies  Patient Measurements: Weight: 58.1 kg (128 lb) Heparin Dosing Weight: 58.1kg  Vital Signs: Temp: 98.3 F (36.8 C) (10/06 0729) Temp Source: Axillary (10/06 0729) BP: 120/55 (10/06 0729) Pulse Rate: 97 (10/06 0729)  Labs: Recent Labs    01/21/23 0440 01/21/23 1239 01/21/23 2043 01/22/23 1225 01/22/23 1501 01/23/23 0101 01/23/23 0840  HGB 10.9*  --   --  10.7*  --   --  11.2*  HCT 36.2  --   --  35.5*  --   --  36.7  PLT 185  --   --  194  --   --  252  HEPARINUNFRC  --    < > 0.15* >1.10* 0.13* 0.24* 0.15*  CREATININE 0.84  --  0.71 0.64  --  0.62  --    < > = values in this interval not displayed.    Estimated Creatinine Clearance: 53 mL/min (by C-G formula based on SCr of 0.62 mg/dL).  Assessment: 77 y.o. female with new LLE DVT for heparin. No AC prior to admission.  Patient has been on heparin gtt for a new DVT since 10/3. Have been unable to get her in therapeutic range as she was initially supratherapeutic but is now subtherapeutic on those same doses. Confirmed with RN this AM there have been no issues or interruptions with infusion. Okay to switch to enoxaparin per MD as pt is still unable to take PO meds.  CBC stable, no s/sx of bleeding per RN. eCrCl 53 mL/min w/ BMI 21.3.  Goal of Therapy:  Monitor platelets by anticoagulation protocol: Yes   Plan:  Discontinue heparin at time of first enoxaparin dose Start enoxaparin 1.5 mg/kg Q24H (87.5mg  rounded to 90mg  for ease of dosing for nursing) Daily CBC and s/sx of bleeding F/u [facility] plans once no longer NPO  Nicole Kindred, PharmD PGY1 Pharmacy Resident 01/23/2023 11:24 AM

## 2023-01-23 NOTE — Consult Note (Signed)
Palliative Care Consult Note                                  Date: 01/23/2023   Patient Name: Amanda [name]  DOB: 1945-08-03  MRN: [id number]  Age / Sex: 77 y.o., female  PCP: [name], MD Referring Physician: Zigmund Daniel., *  Reason for Consultation: Establishing goals of care  HPI/Patient Profile: 77 y.o. female  with past medical history of moderate dementia admitted on 01/18/2023 with AMS and poor PO intake. She was found to have AKI and hypernatremia.  Past Medical History:  Diagnosis Date   Dementia (HCC)    Dyslipidemia     Subjective:   I have reviewed medical records including EPIC notes, labs and imaging, assessed the patient and then met with the patient's husband and daughter to discuss diagnosis prognosis, GOC, EOL wishes, disposition and options.  I introduced Palliative Medicine as specialized medical care for people living with serious illness. It focuses on providing relief from symptoms and stress of a serious illness. The goal is to improve quality of life for both the patient and the family.  Today's Discussion: The patient's family has a good understanding of her illness. We discuss that her dementia is a progressive and terminal disease. We also discussed her dysphagia and risk of aspirating.  We also discuss her dramatic recent change in functional status. Up until her hospitalization in June the patient was able to walk, eat and drink, and participate in ADLs with some assistance. She has always enjoyed eating and had a good appetite until September. She is married with two adult children. 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  A discussion was had today regarding advanced directives. Concepts specific to code status, artifical feeding,  and rehospitalization was had.  The difference between a aggressive medical intervention path and a palliative comfort care path for this  patient at this time was had. Recommended consideration of DNR status, understanding evidenced-based poor outcomes in similar hospitalized patients, as the cause of the arrest is likely associated with chronic/terminal disease rather than a reversible acute cardio-pulmonary event. They would like to discuss as a family before making a change to code status. Patient remains full code. We discussed scopes of care and what they would look like for this patient. They would like to discuss this as a family. Patient remains full scope. We discussed the patient's increased risk for aspiration at length. We discussed options including trial of NG tube, PEG tube, and comfort feeds. I reviewed what these would look like and emphasized the importance of considering her quality of life, overall suffering, what would be acceptable to her in the future.  This was a lot of information for the family to hear and they need time to consider what the patient would want if she could make the decision. We discussed the importance of continued conversation with family and the medical providers regarding overall plan of care and treatment options, ensuring decisions are within the context of the patient's values and GOCs.  Questions and concerns were addressed. Hard Choices booklet left for review. The family was encouraged to call with questions or concerns. PMT will continue to support holistically.  Review of Systems  Unable to perform ROS   Objective:   Primary Diagnoses: Present on Admission:  Moderate dementia (HCC)  AKI (acute kidney injury) (HCC)  Hypernatremia  Acute metabolic encephalopathy  (Resolved) Dehydration  SIRS (systemic inflammatory response syndrome) (HCC)  Hypertension  Decubitus ulcer   Physical Exam Vitals reviewed.  Constitutional:      General: She is sleeping.     Comments: Mitts on  HENT:     Head: Normocephalic and atraumatic.  Cardiovascular:     Rate and Rhythm: Tachycardia  present.  Pulmonary:     Effort: Pulmonary effort is normal.     Vital Signs:  BP 130/62 (BP Location: Left Arm)   Pulse 100   Temp 97.9 F (36.6 C) (Oral)   Resp 19   Wt 58.1 kg   SpO2 100%   BMI 21.30 kg/m    Advanced Care Planning:   Existing Vynca/ACP Documentation: None  Primary Decision Maker: NEXT OF KIN. Patient's husband Amanda [name].  Code Status/Advance Care Planning: Full code   Assessment & Plan:   SUMMARY OF RECOMMENDATIONS   Full code Full scope Encouraged continued discussion between family re: goals of care and code status Continued PMT support   Discussed with: bedside RN and Dr. Lowell Guitar  Time Total: 75 minutes  Thank you for allowing [name] to participate in the care of Amanda [name] PMT will continue to support holistically.   Signed by: Sarina Ser, NP Palliative Medicine Team  Team Phone # [phone] (Nights/Weekends)  01/23/2023, 1:46 PM

## 2023-01-23 NOTE — Progress Notes (Signed)
PHARMACY - ANTICOAGULATION  Pharmacy Consult for heparin Indication: DVT Brief A/P: Heparin level subtherapeutic Increase Heparin rate  No Known Allergies  Patient Measurements: Weight: 58.1 kg (128 lb) Heparin Dosing Weight: 58.1kg  Vital Signs: Temp: 98 F (36.7 C) (10/05 2320) Temp Source: Oral (10/05 2320) BP: 144/75 (10/05 2320) Pulse Rate: 87 (10/05 1729)  Labs: Recent Labs    01/20/23 0355 01/20/23 1423 01/21/23 0440 01/21/23 1239 01/21/23 2043 01/22/23 1225 01/22/23 1501 01/23/23 0101  HGB 10.4*  --  10.9*  --   --  10.7*  --   --   HCT 34.4*  --  36.2  --   --  35.5*  --   --   PLT 170  --  185  --   --  194  --   --   HEPARINUNFRC >1.10*   < >  --    < > 0.15* >1.10* 0.13* 0.24*  CREATININE 1.35*   < > 0.84  --  0.71 0.64  --  0.62   < > = values in this interval not displayed.    Estimated Creatinine Clearance: 53 mL/min (by C-G formula based on SCr of 0.62 mg/dL).  Assessment: 77 y.o. female with DVT for heparin.    Goal of Therapy:  Heparin level 0.3-0.7 units/ml Monitor platelets by anticoagulation protocol: Yes   Plan:  Increase Heparin 650 units/hr If unable to take meds orally or via tube, consider switch to Lovenox for DVT treatment  Geannie Risen, PharmD, BCPS

## 2023-01-24 ENCOUNTER — Inpatient Hospital Stay (HOSPITAL_COMMUNITY): Payer: PPO

## 2023-01-24 DIAGNOSIS — Z7189 Other specified counseling: Secondary | ICD-10-CM | POA: Diagnosis not present

## 2023-01-24 DIAGNOSIS — G9341 Metabolic encephalopathy: Secondary | ICD-10-CM | POA: Diagnosis not present

## 2023-01-24 DIAGNOSIS — G319 Degenerative disease of nervous system, unspecified: Secondary | ICD-10-CM | POA: Diagnosis not present

## 2023-01-24 DIAGNOSIS — R4182 Altered mental status, unspecified: Secondary | ICD-10-CM | POA: Diagnosis not present

## 2023-01-24 DIAGNOSIS — Z515 Encounter for palliative care: Secondary | ICD-10-CM | POA: Diagnosis not present

## 2023-01-24 LAB — CBC WITH DIFFERENTIAL/PLATELET
Abs Immature Granulocytes: 0.2 10*3/uL — ABNORMAL HIGH (ref 0.00–0.07)
Basophils Absolute: 0 10*3/uL (ref 0.0–0.1)
Basophils Relative: 0 %
Eosinophils Absolute: 0.1 10*3/uL (ref 0.0–0.5)
Eosinophils Relative: 1 %
HCT: 33.2 % — ABNORMAL LOW (ref 36.0–46.0)
Hemoglobin: 10.6 g/dL — ABNORMAL LOW (ref 12.0–15.0)
Immature Granulocytes: 2 %
Lymphocytes Relative: 9 %
Lymphs Abs: 1 10*3/uL (ref 0.7–4.0)
MCH: 26.4 pg (ref 26.0–34.0)
MCHC: 31.9 g/dL (ref 30.0–36.0)
MCV: 82.8 fL (ref 80.0–100.0)
Monocytes Absolute: 1.1 10*3/uL — ABNORMAL HIGH (ref 0.1–1.0)
Monocytes Relative: 10 %
Neutro Abs: 8.4 10*3/uL — ABNORMAL HIGH (ref 1.7–7.7)
Neutrophils Relative %: 78 %
Platelets: 240 10*3/uL (ref 150–400)
RBC: 4.01 MIL/uL (ref 3.87–5.11)
RDW: 15.6 % — ABNORMAL HIGH (ref 11.5–15.5)
WBC: 10.7 10*3/uL — ABNORMAL HIGH (ref 4.0–10.5)
nRBC: 0 % (ref 0.0–0.2)

## 2023-01-24 LAB — GLUCOSE, CAPILLARY
Glucose-Capillary: 75 mg/dL (ref 70–99)
Glucose-Capillary: 16 mg/dL — CL (ref 70–99)
Glucose-Capillary: 87 mg/dL (ref 70–99)
Glucose-Capillary: 93 mg/dL (ref 70–99)
Glucose-Capillary: 88 mg/dL (ref 70–99)

## 2023-01-24 LAB — COMPREHENSIVE METABOLIC PANEL
ALT: 32 U/L (ref 0–44)
AST: 35 U/L (ref 15–41)
Albumin: 2.1 g/dL — ABNORMAL LOW (ref 3.5–5.0)
Alkaline Phosphatase: 55 U/L (ref 38–126)
Anion gap: 7 (ref 5–15)
BUN: <5 mg/dL — ABNORMAL LOW (ref 8–23)
CO2: 24 mmol/L (ref 22–32)
Calcium: 7.6 mg/dL — ABNORMAL LOW (ref 8.9–10.3)
Chloride: 106 mmol/L (ref 98–111)
Creatinine, Ser: 0.45 mg/dL (ref 0.44–1.00)
GFR, Estimated: >60 mL/min (ref >60)
Glucose, Bld: 92 mg/dL (ref 70–99)
Potassium: 2.8 mmol/L — ABNORMAL LOW (ref 3.5–5.1)
Sodium: 137 mmol/L (ref 135–145)
Total Bilirubin: 0.5 mg/dL (ref 0.3–1.2)
Total Protein: 5.9 g/dL — ABNORMAL LOW (ref 6.5–8.1)

## 2023-01-24 LAB — PHOSPHORUS: Phosphorus: 2.3 mg/dL — ABNORMAL LOW (ref 2.5–4.6)

## 2023-01-24 LAB — LIPASE, BLOOD: Lipase: 49 U/L (ref 11–51)

## 2023-01-24 LAB — MAGNESIUM: Magnesium: 1.4 mg/dL — ABNORMAL LOW (ref 1.7–2.4)

## 2023-01-24 MED ORDER — ORAL CARE MOUTH RINSE: 15.0000 mL | OROMUCOSAL | Status: DC | PRN

## 2023-01-24 MED ORDER — MAGNESIUM SULFATE 4 GM/100ML IV SOLN
4.0000 g | Freq: Once | INTRAVENOUS | Status: AC
  Administered 2023-01-24: 4 g via INTRAVENOUS
  Filled 2023-01-24: qty 100

## 2023-01-24 MED ORDER — POTASSIUM CHLORIDE 10 MEQ/100ML IV SOLN
10.0000 meq | INTRAVENOUS | Status: AC
  Administered 2023-01-24 (×4): 10 meq via INTRAVENOUS
  Filled 2023-01-24 (×4): qty 100

## 2023-01-24 MED ORDER — ORAL CARE MOUTH RINSE
15.0000 mL | OROMUCOSAL | Status: DC
  Administered 2023-01-24: 15 mL via OROMUCOSAL

## 2023-01-24 MED ORDER — POTASSIUM CHLORIDE 2 MEQ/ML IV SOLN
INTRAVENOUS | Status: DC
  Filled 2023-01-24 (×4): qty 990

## 2023-01-24 NOTE — Progress Notes (Addendum)
Speech Language Pathology Treatment: Dysphagia  Patient Details Name: Amanda [name] MRN: [id number] DOB: May 05, 1945 Today's Date: 01/24/2023 Time: 9629-5284 SLP Time Calculation (min) (ACUTE ONLY): 13 min  Assessment / Plan / Recommendation Clinical Impression  Pt demonstrating marginal improvement in mentation this date, able to sustain a level of alertness adequate for PO trials with Min verbal cueing. SLP performed thorough oral care. Provided trials of thin liquids and purees with reduced awareness of bolus presentations, although overall improved clinical presentation. Pt unable to initiate sips via straw, although exhibited no s/s of aspiration given thin liquids via teaspoon. She continues to be confused, although suspect mentation will remain the most significant inhibitor of adequate PO intake. Suspect with careful hand feeding and full supervision pt can initiate diet of Dys 1 textures with thin liquids via teaspoon. Question her ability to meet nutritional needs with POs given noted decreased PO intake PTA. Will continue to follow to ensure safety with diet recommendation given fluctuating mentation.    HPI HPI: Amanda [name] is a 77 yo female presenting to ED 10/1 with worsening AMS and poor PO intake. Admitted with AKI and hypernatremia. Seen by SLP June 2024 with cognitively based dysphagia c/b sustained attention deficits affecting mastication and oral transit. Per notes at that time, pt orally holds foods she does not like and her husband is typically present to order desired food choices and provide careful hand feeding. PMH includes moderate dementia      SLP Plan  Continue with current plan of care      Recommendations for follow up therapy are one component of a multi-disciplinary discharge planning process, led by the attending physician.  Recommendations may be updated based on patient status, additional functional criteria and insurance authorization.     Recommendations  Diet recommendations: Dysphagia 1 (puree);Thin liquid Liquids provided via: Teaspoon;Cup Medication Administration: Crushed with puree Supervision: Staff to assist with self feeding;Full supervision/cueing for compensatory strategies Compensations: Minimize environmental distractions;Slow rate;Small sips/bites Postural Changes and/or Swallow Maneuvers: Seated upright 90 degrees;Upright 30-60 min after meal                  Oral care QID;Staff/trained caregiver to provide oral care;Oral care prior to ice chip/H20   Frequent or constant Supervision/Assistance Dysphagia, unspecified (R13.10)     Continue with current plan of care     Amanda [name], M.A., CF-SLP Speech Language Pathology, Acute Rehabilitation Services  Secure Chat preferred [phone]   01/24/2023, 4:48 PM

## 2023-01-24 NOTE — Progress Notes (Signed)
Palliative Medicine Inpatient Follow Up Note HPI: 77 y.o. female  with past medical history of moderate dementia admitted on 01/18/2023 with AMS and poor PO intake. She was found to have AKI and hypernatremia.   Palliative care has been asked to get involved to further assist with goals of care conversations.   Today's Discussion 01/24/2023  *Please note that this is a verbal dictation therefore any spelling or grammatical errors are due to the "Dragon Medical One" system interpretation.  Chart reviewed inclusive of vital signs, progress notes, laboratory results, and diagnostic images.   I met with Amanda [name] at bedside this morning. She is not verbal with me. She has mittens on and appears quite frail.  A family meeting was held at 2:30 PM inclusive of patients spouse, Amanda [name], daughter, Amanda [name], and son, Amanda [name] via [employer].  Discussed the events which have led to patients hospitalization and her decline from June of this year to the present time. Reviewed the chronic disease trajectory. Discussed dementia and it's phases. Discussed in the later phases the lack of desire to eat and drink.  Reviewed that health care professionals commonly rely on feeding tubes to supply nutrition to severely demented patients. However, various studies have not shown use of feeding tubes to be effective in preventing malnutrition, prolongation of life, or improved quality of life. Discussed short term versus long term artificial feeding options.  Reviewed code status in detail. Discussed the potential burdens/trauma associated with full resuscitation. Reviewed the unlikely reality of patients in Amanda [name]'s condition surviving such events and the potential of long term supportive measures. Discussed possible decline in overall quality of life lived.   Discussed hospice as an option if Amanda [name] continues to fail to thrive. I shared openly and honestly with advanced dementia this is something that cannot be  cured as we cannot reverse the effects of dementia on the brain. We reviewed the likely ongoing cycle of malnutrition/dehydration in dementia patients.   Diagnostics tests reviewed with patients family inclusive of MRI Brain and CT Chest.  Patients family would like time to talk among themselves prior to making additional decisions.   Questions and concerns addressed/Palliative Support Provided.   Objective Assessment: Vital Signs Vitals:   01/24/23 1204 01/24/23 1700  BP: 126/66 (!) 129/55  Pulse: 90 99  Resp:  (!) 22  Temp: (!) 97.3 F (36.3 C) 98.2 F (36.8 C)  SpO2: 100% 100%    Intake/Output Summary (Last 24 hours) at 01/24/2023 1703 Last data filed at 01/24/2023 0024 Gross per 24 hour  Intake 197.96 ml  Output --  Net 197.96 ml   Last Weight  Most recent update: 01/19/2023  3:37 AM    Weight  58.1 kg (128 lb)            Gen:  Frail Elderly AA F chronically ill in appearance HEENT: Dry mucous membranes CV: Regular rate and rhythm  PULM:  On RA, breathing is even and nonlabored ABD: soft/nontender  EXT: (+) muscle wasting (-) edema  Neuro: Lethargic  SUMMARY OF RECOMMENDATIONS   Full code --> Family plans to speak more among themselves  Patients family plan to discuss artificial nutrition  Open and honest conversations held in regards to dementia progression  Ongoing PMT support  Time Spent: 67 Billing based on MDM: High ______________________________________________________________________________________ Amanda [name] [place] Palliative Medicine Team Team Cell Phone: [phone] Please utilize secure chat with additional questions, if there is no response within 30 minutes please call the above phone number  Palliative Medicine Team providers are available by phone from 7am to 7pm daily and can be reached through the team cell phone.  Should this patient require assistance outside of these hours, please call the patient's attending  physician.

## 2023-01-24 NOTE — Progress Notes (Signed)
PROGRESS NOTE    Amanda [name]  ZOX:[id number] DOB: 07-May-1945 DOA: 01/18/2023 PCP: [name], MD  Chief Complaint  Patient presents with   Altered Mental Status   Hypertension    Brief Narrative:   Amanda [name] is Amanda [name] 77 y.o. female with medical history significant of moderate dementia who presents with progressive AMS and poor PO intake.    She's been admitted with AKI, hypernatremia, and AMS.   Assessment & Plan:   Principal Problem:   Acute metabolic encephalopathy Active Problems:   AKI (acute kidney injury) (HCC)   Hypernatremia   SIRS (systemic inflammatory response syndrome) (HCC)   Decubitus ulcer   Hypertension   Moderate dementia (HCC)  Goals of Care Patient remains critically ill with persistent encephalopathy. Her overall prognosis is poor.  With improvement in her creatinine/bun, her mental status has not significantly improved.  Has chronic dementia.  I doubt her ability to take adequate PO safely.  Discussed with husband 10/2, he wants full code at this time.  Discussed additionally 10/3, I recommended involvement of palliative care to help with continued goc conversations.  Family meeting 10/6.  Remains full scope and full code at this time.  Continued GOC conversations ongoing.    Acute metabolic encephalopathy Dementia  Dementia for years.  at baseline, wheelchair bound (walked 80 ft with walker with assistance in early September at rehab - wheelchair bound since being home), she's disoriented typically, but will communicate.   Likely multifactorial (hypernatremia, uremia/AKI, dementia) but inciting cause unclear.  Not able to discuss with family yet today to discuss her baseline and timing for this decline. No clear infectious cause (CXR without active disease, CT maxillofacial without notable findings, CT abd/pelvis without acute findings, decub doesn't appear infected) TSH wnl, ammonia wnl  B12 (elevated), folate low -> supplement EEG  with moderate to severe diffuse encephalopathy, no seizures or epileptiform discharges CT head without acute abnormality MRI brain without acute abnormality  Treating hypernatremia/AKI (significantly improved) Consider additional workup/imaging - she remains altered, unable to follow commands or communicate meaningfully    SIRS (systemic inflammatory response syndrome) (HCC) Initial tachycardia, WBC 17k.  Question of SIRS / Sepsis with unclear source: UA neg No obvious dental infection on my exam Decubitus ulcer is at least stage 2, but no obvious erythema / drainage S/p cefepime and vanc in ED CT maxilofacial to r/o dental abscess -> without acute findings CT abd pelvis with large stool burden, air within bladder Repeat CXR 10/3 -> without acute cardiopulm dz  Blood cultures NGx4 Urine cx no growth  Covid and RVP negative Will continue ceftriaxone empirically (7 days) for now, broaden as indicated    Dysphagia Not safe for PO at this time given AMS - daughter asked about TPN -> discussed we'd prefer enteral nutrition since no contraindication, if artificial nutrition pursued, would probably recommend tube feeds via cortrak (would need to discuss whether time limited trial).  My preference would be to avoid feeding tube with her history, will discuss additionally.   SLP recommending NPO -> will follow with SLP today  Hypernatremia Severe dehydration due to decreased PO intake. Improved    AKI  likely pre-renal with hx poor PO intake Baseline creatinine 0.69 Significantly improved CT without hydro   Hypoglycemia Continue D10 fluids    DVT Extensive --> [name] with DVT involving L common femoral vein, SF junction, L femoral vein, L proximal profunda vein, L popliteal vein, L posterior tibial veins, and L peroneal veins  Heparin gtt -> transition to lovenox today  Hypokalemia Hypomagnesemia Replace and follow   Elevated Lipase Unclear significance - CT without findings c/w  pancreatitis, not significantly tender on exam Trend   Decubitus ulcer Wound care consult -> appreciate recs No obvious erythema, drainage, crepitus.   Hypertension Chart diagnosis, doesn't appear to be on any chronic meds for this. BP appropriate without meds     DVT prophylaxis: heparin gtt Code Status: full Family Communication: husband/daughter 10/6 over phone Disposition:   Status is: Inpatient Remains inpatient appropriate because: need for continued inpatient care   Consultants:  Palliative care  Procedures:  LE [name] LEFT:  - Findings consistent with acute deep vein thrombosis involving the left  common femoral vein, SF junction, left femoral vein, left proximal  profunda vein, left popliteal vein, left posterior tibial veins, and left  peroneal veins.   Antimicrobials:  Anti-infectives (From admission, onward)    Start     Dose/Rate Route Frequency Ordered Stop   01/19/23 1900  ceFEPIme (MAXIPIME) 2 g in sodium chloride 0.9 % 100 mL IVPB  Status:  Discontinued        2 g 200 mL/hr over 30 Minutes Intravenous Every 24 hours 01/19/23 0338 01/19/23 0945   01/19/23 1000  cefTRIAXone (ROCEPHIN) 2 g in sodium chloride 0.9 % 100 mL IVPB       Note to Pharmacy: Retime as indicated   2 g 200 mL/hr over 30 Minutes Intravenous Every 24 hours 01/19/23 0945 01/26/23 0959   01/19/23 0342  vancomycin variable dose per unstable renal function (pharmacist dosing)  Status:  Discontinued         Does not apply See admin instructions 01/19/23 0343 01/20/23 0718   01/18/23 1830  vancomycin (VANCOREADY) IVPB 1250 mg/250 mL        1,250 mg 166.7 mL/hr over 90 Minutes Intravenous  Once 01/18/23 1818 01/18/23 2122   01/18/23 1815  ceFEPIme (MAXIPIME) 2 g in sodium chloride 0.9 % 100 mL IVPB        2 g 200 mL/hr over 30 Minutes Intravenous  Once 01/18/23 1807 01/18/23 1911       Subjective: Unintelligible speech   Objective: Vitals:   01/23/23 2303 01/24/23 0300 01/24/23 0752  01/24/23 1204  BP: 125/62 (!) 125/52 131/71 126/66  Pulse: 96 92 88 90  Resp: 20 17 (!) 21   Temp: 97.6 F (36.4 C) 97.7 F (36.5 C) (!) 97.5 F (36.4 C) (!) 97.3 F (36.3 C)  TempSrc: Oral Oral Axillary Axillary  SpO2: 100% 100% 100% 100%  Weight:        Intake/Output Summary (Last 24 hours) at 01/24/2023 1523 Last data filed at 01/24/2023 0024 Gross per 24 hour  Intake 197.96 ml  Output --  Net 197.96 ml   Filed Weights   01/19/23 0315  Weight: 58.1 kg    Examination:  General: No acute distress. Cardiovascular: RRR Lungs: unlabored Neurological: awake, nonintelligible speech, moving all extremities Extremities: No clubbing or cyanosis. No edema.  Data Reviewed: I have personally reviewed following labs and imaging studies  CBC: Recent Labs  Lab 01/18/23 1848 01/19/23 0715 01/20/23 0355 01/21/23 0440 01/22/23 1225 01/23/23 0840 01/24/23 1100  WBC 17.8*   < > 13.5* 11.3* 10.8* 12.8* 10.7*  NEUTROABS 15.3*  --  10.4* 8.4* 7.8*  --  8.4*  HGB 13.2   < > 10.4* 10.9* 10.7* 11.2* 10.6*  HCT 44.2   < > 34.4* 36.2 35.5* 36.7 33.2*  MCV  86.3   < > 84.1 87.2 83.9 83.4 82.8  PLT 209   < > 170 185 194 252 240   < > = values in this interval not displayed.    Basic Metabolic Panel: Recent Labs  Lab 01/20/23 0355 01/20/23 2024 01/21/23 0440 01/21/23 2043 01/22/23 1225 01/23/23 0101 01/23/23 0840 01/23/23 1946 01/24/23 1100  NA 149*   < > 142 143 144 144  --  144 137  K 3.3*   < > 3.4* 4.0 3.4* 3.7  --  3.4* 2.8*  CL 111   < > 107 108 106 109  --  105 106  CO2 23   < > 25 23 23 24   --  23 24  GLUCOSE 155*   < > 109* 109* 103* 92  --  113* 92  BUN 93*   < > 43* 22 8 6*  --  <5* <5*  CREATININE 1.35*   < > 0.84 0.71 0.64 0.62  --  0.57 0.45  CALCIUM 8.3*   < > 8.2* 8.1* 8.0* 8.1*  --  8.2* 7.6*  MG 2.6*  --  2.2  --  1.5*  --  2.0  --  1.4*  PHOS 2.2*  --  1.7*  --  1.9*  --  1.9*  --  2.3*   < > = values in this interval not displayed.     GFR: Estimated Creatinine Clearance: 53 mL/min (by C-G formula based on SCr of 0.45 mg/dL).  Liver Function Tests: Recent Labs  Lab 01/18/23 1848 01/20/23 0355 01/21/23 0440 01/22/23 1225 01/24/23 1100  AST 48* 38 64* 38 35  ALT 30 26 36 29 32  ALKPHOS 64 47 55 51 55  BILITOT 0.4 0.6 0.7 0.2* 0.5  PROT 8.4* 6.6 6.9 6.2* 5.9*  ALBUMIN 3.3* 2.5* 2.6* 2.2* 2.1*    CBG: Recent Labs  Lab 01/23/23 2104 01/24/23 0317 01/24/23 0754 01/24/23 0758 01/24/23 1222  GLUCAP 90 75 16* 87 93     Recent Results (from the past 240 hour(s))  Blood culture (routine x 2)     Status: None   Collection Time: 01/18/23  6:48 PM   Specimen: BLOOD RIGHT ARM  Result Value Ref Range Status   Specimen Description BLOOD RIGHT ARM  Final   Special Requests   Final    BOTTLES DRAWN AEROBIC AND ANAEROBIC Blood Culture adequate volume   Culture   Final    NO GROWTH 5 DAYS Performed at [facility], [address] [place], [place] [address]    Report Status 01/23/2023 FINAL  Final  Blood culture (routine x 2)     Status: None   Collection Time: 01/18/23  6:49 PM   Specimen: BLOOD RIGHT ARM  Result Value Ref Range Status   Specimen Description BLOOD RIGHT ARM  Final   Special Requests   Final    BOTTLES DRAWN AEROBIC AND ANAEROBIC Blood Culture adequate volume   Culture   Final    NO GROWTH 5 DAYS Performed at [facility], [address], [place], [place] [address]    Report Status 01/23/2023 FINAL  Final  Urine Culture     Status: None   Collection Time: 01/18/23  7:07 PM   Specimen: Urine, Clean Catch  Result Value Ref Range Status   Specimen Description URINE, CLEAN CATCH  Final   Special Requests NONE  Final   Culture   Final    NO GROWTH Performed  at [facility], [address], [place], [place] [address]    Report Status 01/20/2023 FINAL  Final  SARS Coronavirus 2 by RT PCR (hospital order, performed in [facility] hospital lab) *cepheid single result  test* Anterior Nasal Swab     Status: None   Collection Time: 01/18/23 11:09 PM   Specimen: Anterior Nasal Swab  Result Value Ref Range Status   SARS Coronavirus 2 by RT PCR NEGATIVE NEGATIVE Final    Comment: Performed at [facility], [address] [place], [place] [address]  Respiratory (~20 pathogens) panel by PCR     Status: None   Collection Time: 01/18/23 11:09 PM   Specimen: Anterior Nasal Swab; Respiratory  Result Value Ref Range Status   Adenovirus NOT DETECTED NOT DETECTED Final   Coronavirus 229E NOT DETECTED NOT DETECTED Final    Comment: (NOTE) The Coronavirus on the Respiratory Panel, DOES NOT test for the novel  Coronavirus (2019 nCoV)    Coronavirus HKU1 NOT DETECTED NOT DETECTED Final   Coronavirus NL63 NOT DETECTED NOT DETECTED Final   Coronavirus OC43 NOT DETECTED NOT DETECTED Final   Metapneumovirus NOT DETECTED NOT DETECTED Final   Rhinovirus / Enterovirus NOT DETECTED NOT DETECTED Final   Influenza Pollyann Roa NOT DETECTED NOT DETECTED Final   Influenza B NOT DETECTED NOT DETECTED Final   Parainfluenza Virus 1 NOT DETECTED NOT DETECTED Final   Parainfluenza Virus 2 NOT DETECTED NOT DETECTED Final   Parainfluenza Virus 3 NOT DETECTED NOT DETECTED Final   Parainfluenza Virus 4 NOT DETECTED NOT DETECTED Final   Respiratory Syncytial Virus NOT DETECTED NOT DETECTED Final   Bordetella pertussis NOT DETECTED NOT DETECTED Final   Bordetella Parapertussis NOT DETECTED NOT DETECTED Final   Chlamydophila pneumoniae NOT DETECTED NOT DETECTED Final   Mycoplasma pneumoniae NOT DETECTED NOT DETECTED Final    Comment: Performed at [facility] Lab, [address] [place], [place] [address]         Radiology Studies: MR BRAIN WO CONTRAST  Result Date: 01/24/2023 CLINICAL DATA:  Mental status change, unknown cause. EXAM: MRI HEAD WITHOUT CONTRAST TECHNIQUE: Multiplanar, multiecho pulse sequences of the brain and surrounding structures were obtained without intravenous  contrast. COMPARISON:  CT head January 18, 2023. FINDINGS: Brain: No acute infarction, hemorrhage, hydrocephalus, extra-axial collection or mass lesion. Cerebral atrophy. Vascular: Major arterial flow voids are maintained at the skull base. Skull and upper cervical spine: Normal marrow signal. Sinuses/Orbits: Negative. Other: No mastoid effusions. IMPRESSION: 1. No evidence of acute intracranial abnormality. 2.  Cerebral Atrophy (ICD10-G31.9). Electronically Signed   By: Feliberto Harts M.D.   On: 01/24/2023 14:23   CT CHEST WO CONTRAST  Result Date: 01/23/2023 CLINICAL DATA:  Altered mental status EXAM: CT CHEST WITHOUT CONTRAST TECHNIQUE: Multidetector CT imaging of the chest was performed following the standard protocol without IV contrast. RADIATION DOSE REDUCTION: This exam was performed according to the departmental dose-optimization program which includes automated exposure control, adjustment of the mA and/or kV according to patient size and/or use of iterative reconstruction technique. COMPARISON:  None Available. FINDINGS: Cardiovascular: Extensive multi-vessel coronary artery calcification. Global cardiac size within normal limits. No pericardial effusion. Central pulmonary arteries are of normal caliber. Mild atherosclerotic calcification within the thoracic aorta. No aortic aneurysm. Mediastinum/Nodes: No enlarged mediastinal or axillary lymph nodes. Thyroid gland, trachea, and esophagus demonstrate no significant findings. Small hiatal hernia. Lungs/Pleura: Lungs are clear. No pleural effusion or pneumothorax. Upper Abdomen: No acute abnormality. Musculoskeletal: No  chest wall mass or suspicious bone lesions identified. IMPRESSION: 1. No acute intrathoracic pathology identified. 2. Extensive multi-vessel coronary artery calcification. 3. Small hiatal hernia. Aortic Atherosclerosis (ICD10-I70.0). Electronically Signed   By: Helyn Numbers M.D.   On: 01/23/2023 22:52        Scheduled Meds:   enoxaparin (LOVENOX) injection  90 mg Subcutaneous Q24H   folic acid  1 mg Intravenous Daily   mouth rinse  15 mL Mouth Rinse 4 times per day   silver sulfADIAZINE  1 Application Topical Daily   [START ON 01/27/2023] thiamine (VITAMIN B1) injection  100 mg Intravenous Daily   Continuous Infusions:  cefTRIAXone (ROCEPHIN)  IV 2 g (01/24/23 1129)   dextrose 50 mL/hr at 01/22/23 1706   thiamine (VITAMIN B1) injection 250 mg (01/24/23 1123)     LOS: 6 days    Time spent: over 30 min    Lacretia Nicks, MD [facility]   To contact the attending provider between 7A-7P or the covering provider during after hours 7P-7A, please log into the web site [email or web address] and access using universal [place] password for that web site. If you do not have the password, please call the hospital operator.  01/24/2023, 3:23 PM

## 2023-01-25 DIAGNOSIS — R627 Adult failure to thrive: Secondary | ICD-10-CM | POA: Diagnosis not present

## 2023-01-25 DIAGNOSIS — Z515 Encounter for palliative care: Secondary | ICD-10-CM | POA: Diagnosis not present

## 2023-01-25 DIAGNOSIS — G9341 Metabolic encephalopathy: Secondary | ICD-10-CM | POA: Diagnosis not present

## 2023-01-25 DIAGNOSIS — Z7189 Other specified counseling: Secondary | ICD-10-CM | POA: Diagnosis not present

## 2023-01-25 LAB — MAGNESIUM: Magnesium: 2.2 mg/dL (ref 1.7–2.4)

## 2023-01-25 LAB — CBC WITH DIFFERENTIAL/PLATELET
Abs Immature Granulocytes: 0.12 10*3/uL — ABNORMAL HIGH (ref 0.00–0.07)
Basophils Absolute: 0 10*3/uL (ref 0.0–0.1)
Basophils Relative: 0 %
Eosinophils Absolute: 0 10*3/uL (ref 0.0–0.5)
Eosinophils Relative: 0 %
HCT: 32.8 % — ABNORMAL LOW (ref 36.0–46.0)
Hemoglobin: 10.4 g/dL — ABNORMAL LOW (ref 12.0–15.0)
Immature Granulocytes: 1 %
Lymphocytes Relative: 10 %
Lymphs Abs: 0.9 10*3/uL (ref 0.7–4.0)
MCH: 26.2 pg (ref 26.0–34.0)
MCHC: 31.7 g/dL (ref 30.0–36.0)
MCV: 82.6 fL (ref 80.0–100.0)
Monocytes Absolute: 0.9 10*3/uL (ref 0.1–1.0)
Monocytes Relative: 10 %
Neutro Abs: 7.5 10*3/uL (ref 1.7–7.7)
Neutrophils Relative %: 79 %
Platelets: 240 10*3/uL (ref 150–400)
RBC: 3.97 MIL/uL (ref 3.87–5.11)
RDW: 15.4 % (ref 11.5–15.5)
WBC: 9.6 10*3/uL (ref 4.0–10.5)
nRBC: 0 % (ref 0.0–0.2)

## 2023-01-25 LAB — GLUCOSE, CAPILLARY
Glucose-Capillary: 95 mg/dL (ref 70–99)
Glucose-Capillary: 106 mg/dL — ABNORMAL HIGH (ref 70–99)
Glucose-Capillary: 95 mg/dL (ref 70–99)
Glucose-Capillary: 71 mg/dL (ref 70–99)

## 2023-01-25 LAB — COMPREHENSIVE METABOLIC PANEL
ALT: 32 U/L (ref 0–44)
AST: 33 U/L (ref 15–41)
Albumin: 2.1 g/dL — ABNORMAL LOW (ref 3.5–5.0)
Alkaline Phosphatase: 58 U/L (ref 38–126)
Anion gap: 9 (ref 5–15)
BUN: <5 mg/dL — ABNORMAL LOW (ref 8–23)
CO2: 23 mmol/L (ref 22–32)
Calcium: 7.5 mg/dL — ABNORMAL LOW (ref 8.9–10.3)
Chloride: 106 mmol/L (ref 98–111)
Creatinine, Ser: 0.47 mg/dL (ref 0.44–1.00)
GFR, Estimated: >60 mL/min (ref >60)
Glucose, Bld: 108 mg/dL — ABNORMAL HIGH (ref 70–99)
Potassium: 3.2 mmol/L — ABNORMAL LOW (ref 3.5–5.1)
Sodium: 138 mmol/L (ref 135–145)
Total Bilirubin: 0.4 mg/dL (ref 0.3–1.2)
Total Protein: 5.9 g/dL — ABNORMAL LOW (ref 6.5–8.1)

## 2023-01-25 LAB — PHOSPHORUS: Phosphorus: 2 mg/dL — ABNORMAL LOW (ref 2.5–4.6)

## 2023-01-25 NOTE — Progress Notes (Signed)
   Palliative Medicine Inpatient Follow Up Note HPI: 77 y.o. female  with past medical history of moderate dementia admitted on 01/18/2023 with AMS and poor PO intake. She was found to have AKI and hypernatremia.   Palliative care has been asked to get involved to further assist with goals of care conversations.   Today's Discussion 01/25/2023  *Please note that this is a verbal dictation therefore any spelling or grammatical errors are due to the "Dragon Medical One" system interpretation.  Chart reviewed inclusive of vital signs, progress notes, laboratory results, and diagnostic images.   I met with Amanda [name] at bedside this morning in the presence of her RN. She again is not verbal though exemplifies no nonverbal s/s of distress.   I spoke with patients spouse, Amanda Maduro this afternoon. He shares that he has spoken to Dr. Lowell Guitar and the plan is for an NGT to be placed for short term nutrition. He states that he plans to speak to Dr. Lowell Guitar this evening to confirm this.  We discussed resuscitation conversations, though Amanda Maduro shares that he has not been able to speak to his son about this yet. He plans to have a conversation with his family later tonight and should have clarity regarding this by tomorrow. We discussed that once a decision has been made he is welcome to reach out to team for further conversation.   Offered support given the gravity of the above decisions.   Questions and concerns addressed/Palliative Support Provided.   Objective Assessment: Vital Signs Vitals:   01/25/23 1200 01/25/23 1237  BP:    Pulse: 95 98  Resp: 20 20  Temp: 97.8 F (36.6 C) 98 F (36.7 C)  SpO2: 100%     Intake/Output Summary (Last 24 hours) at 01/25/2023 1409 Last data filed at 01/25/2023 1100 Gross per 24 hour  Intake 2123.01 ml  Output 800 ml  Net 1323.01 ml   Last Weight  Most recent update: 01/19/2023  3:37 AM    Weight  58.1 kg (128 lb)            Gen:  Frail Elderly AA F  chronically ill in appearance HEENT: Dry mucous membranes CV: Regular rate and rhythm  PULM:  On RA, breathing is even and nonlabored ABD: soft/nontender  EXT: (+) muscle wasting (-) edema  Neuro: Lethargic  SUMMARY OF RECOMMENDATIONS   Full code --> Family plans to speak more among themselves this evening regarding this  Patients spouse plans to speak to Dr. Lowell Guitar tonight regarding artificial nutrition though seems to be veering towards short term NGT  Ongoing PMT support  Time - 35 ______________________________________________________________________________________ Amanda [name] [place] Palliative Medicine Team Team Cell Phone: [phone] Please utilize secure chat with additional questions, if there is no response within 30 minutes please call the above phone number  Palliative Medicine Team providers are available by phone from 7am to 7pm daily and can be reached through the team cell phone.  Should this patient require assistance outside of these hours, please call the patient's attending physician.

## 2023-01-25 NOTE — Progress Notes (Addendum)
PROGRESS NOTE    Amanda [name]  WUJ:[id number] DOB: Dec 30, 1945 DOA: 01/18/2023 PCP: [name], MD  Chief Complaint  Patient presents with   Altered Mental Status   Hypertension    Brief Narrative:   Amanda [name] is Amanda [name] 77 y.o. female with medical history significant of moderate dementia who presents with progressive AMS and poor PO intake.    She's been admitted with AKI, hypernatremia, and AMS.  She continues to have delirium with poor PO intake.  Currently questions about artificial nutrition and plan.  Palliative following to discuss goals of care.   Assessment & Plan:   Principal Problem:   Acute metabolic encephalopathy Active Problems:   AKI (acute kidney injury) (HCC)   Hypernatremia   SIRS (systemic inflammatory response syndrome) (HCC)   Decubitus ulcer   Hypertension   Moderate dementia (HCC)  Goals of Care Patient remains critically ill with persistent encephalopathy. Her overall prognosis is poor.  With improvement in her creatinine/bun, her mental status has not significantly improved.  Has chronic dementia.  I doubt her ability to take adequate PO safely.  Discussed with husband 10/2, he wants full code at this time.  Discussed additionally 10/3, I recommended involvement of palliative care to help with continued goc conversations.  Family meeting with palliative.  Remains full scope and full code at this time.  Continued GOC conversations ongoing.   Addendum: discussed with family this evening, they'd like to go forward with trial of cortrak.  They talked about Amanda [name] DNR and are in agreement with regards to this as well.    Acute metabolic encephalopathy Dementia  Dementia for years.  at baseline, wheelchair bound (walked 80 ft with walker with assistance in early September at rehab - wheelchair bound since being home), she's disoriented typically, but will communicate.   Likely multifactorial (hypernatremia, uremia/AKI, dementia) but inciting cause  unclear.  Not able to discuss with family yet today to discuss her baseline and timing for this decline. No clear infectious cause (CXR without active disease, CT maxillofacial without notable findings, CT abd/pelvis without acute findings, decub doesn't appear infected) TSH wnl, ammonia wnl  B12 (elevated), folate low -> supplement EEG with moderate to severe diffuse encephalopathy, no seizures or epileptiform discharges CT head without acute abnormality MRI brain without acute abnormality  Treating hypernatremia/AKI (significantly improved) Consider additional workup/imaging - she remains altered, but is very gradually improving - seems like she is still far from her previous baseline, not sure she'll improve to that point again    SIRS (systemic inflammatory response syndrome) (HCC) Initial tachycardia, WBC 17k.  Question of SIRS / Sepsis with unclear source: UA neg No obvious dental infection on my exam Decubitus ulcer is at least stage 2, but no obvious erythema / drainage S/p cefepime and vanc in ED CT maxilofacial to r/o dental abscess -> without acute findings CT abd pelvis with large stool burden, air within bladder Repeat CXR 10/3 -> without acute cardiopulm dz  Blood cultures NGx4 Urine cx no growth  Covid and RVP negative Will continue ceftriaxone empirically (7 days) for now, broaden as indicated    Dysphagia Not safe for PO at this time given AMS - daughter asked about TPN -> discussed we'd prefer enteral nutrition since no contraindication, if artificial nutrition pursued, would probably recommend tube feeds via cortrak (would need to discuss whether time limited trial).  My preference would be to avoid feeding tube with her history, will discuss additionally.   SLP recommending  NPO -> dysphagia 1, thin liquid - careful hand feeding Continuing discussions regarding artificial nutrition  Hypernatremia Severe dehydration due to decreased PO intake. Improved    AKI   likely pre-renal with hx poor PO intake Baseline creatinine 0.69 Significantly improved CT without hydro   Hypoglycemia Continue D10 fluids    DVT Extensive --> [name] with DVT involving L common femoral vein, SF junction, L femoral vein, L proximal profunda vein, L popliteal vein, L posterior tibial veins, and L peroneal veins Heparin gtt -> transitioned to lovenox   Hypokalemia Hypomagnesemia Replace and follow   Elevated Lipase Unclear significance - CT without findings c/w pancreatitis, not significantly tender on exam Trend   Decubitus ulcer Wound care consult -> appreciate recs No obvious erythema, drainage, crepitus.  Continues to look uninfected.     Hypertension Chart diagnosis, doesn't appear to be on any chronic meds for this. BP appropriate without meds     DVT prophylaxis: lovenox Code Status: full Family Communication: daughter 10/7 and 10/8 over phone Disposition:   Status is: Inpatient Remains inpatient appropriate because: need for continued inpatient care   Consultants:  Palliative care  Procedures:  LE [name] LEFT:  - Findings consistent with acute deep vein thrombosis involving the left  common femoral vein, SF junction, left femoral vein, left proximal  profunda vein, left popliteal vein, left posterior tibial veins, and left  peroneal veins.   Antimicrobials:  Anti-infectives (From admission, onward)    Start     Dose/Rate Route Frequency Ordered Stop   01/19/23 1900  ceFEPIme (MAXIPIME) 2 g in sodium chloride 0.9 % 100 mL IVPB  Status:  Discontinued        2 g 200 mL/hr over 30 Minutes Intravenous Every 24 hours 01/19/23 0338 01/19/23 0945   01/19/23 1000  cefTRIAXone (ROCEPHIN) 2 g in sodium chloride 0.9 % 100 mL IVPB       Note to Pharmacy: Retime as indicated   2 g 200 mL/hr over 30 Minutes Intravenous Every 24 hours 01/19/23 0945 01/26/23 0959   01/19/23 0342  vancomycin variable dose per unstable renal function (pharmacist dosing)   Status:  Discontinued         Does not apply See admin instructions 01/19/23 0343 01/20/23 0718   01/18/23 1830  vancomycin (VANCOREADY) IVPB 1250 mg/250 mL        1,250 mg 166.7 mL/hr over 90 Minutes Intravenous  Once 01/18/23 1818 01/18/23 2122   01/18/23 1815  ceFEPIme (MAXIPIME) 2 g in sodium chloride 0.9 % 100 mL IVPB        2 g 200 mL/hr over 30 Minutes Intravenous  Once 01/18/23 1807 01/18/23 1911       Subjective: Unintelligible speech Discussed with daughter  Objective: Vitals:   01/24/23 1204 01/24/23 1700 01/24/23 2000 01/25/23 0338  BP: 126/66 (!) 129/55 130/82 (!) 143/74  Pulse: 90 99  99  Resp:  (!) 22  20  Temp: (!) 97.3 F (36.3 C) 98.2 F (36.8 C) 98 F (36.7 C) 98.3 F (36.8 C)  TempSrc: Axillary Axillary Axillary Oral  SpO2: 100% 100%  100%  Weight:        Intake/Output Summary (Last 24 hours) at 01/25/2023 0914 Last data filed at 01/25/2023 1610 Gross per 24 hour  Intake 1308.46 ml  Output 800 ml  Net 508.46 ml   Filed Weights   01/19/23 0315  Weight: 58.1 kg    Examination:  General: No acute distress. Cardiovascular: RRR Lungs: unlabored Abdomen:  Soft, nontender, nondistended  Neurological: Alert - responds to some questions (difficult to understand, but Amanda [name] little better than previous days, appears to be responding to me more consistently).  Moving all extremities.  Persistent decubitus ulcer, stage 2 with unstageable area Extremities: No clubbing or cyanosis. No edema.   Data Reviewed: I have personally reviewed following labs and imaging studies  CBC: Recent Labs  Lab 01/20/23 0355 01/21/23 0440 01/22/23 1225 01/23/23 0840 01/24/23 1100 01/25/23 0353  WBC 13.5* 11.3* 10.8* 12.8* 10.7* 9.6  NEUTROABS 10.4* 8.4* 7.8*  --  8.4* 7.5  HGB 10.4* 10.9* 10.7* 11.2* 10.6* 10.4*  HCT 34.4* 36.2 35.5* 36.7 33.2* 32.8*  MCV 84.1 87.2 83.9 83.4 82.8 82.6  PLT 170 185 194 252 240 240    Basic Metabolic Panel: Recent Labs  Lab  01/21/23 0440 01/21/23 2043 01/22/23 1225 01/23/23 0101 01/23/23 0840 01/23/23 1946 01/24/23 1100 01/25/23 0353  NA 142   < > 144 144  --  144 137 138  K 3.4*   < > 3.4* 3.7  --  3.4* 2.8* 3.2*  CL 107   < > 106 109  --  105 106 106  CO2 25   < > 23 24  --  23 24 23   GLUCOSE 109*   < > 103* 92  --  113* 92 108*  BUN 43*   < > 8 6*  --  <5* <5* <5*  CREATININE 0.84   < > 0.64 0.62  --  0.57 0.45 0.47  CALCIUM 8.2*   < > 8.0* 8.1*  --  8.2* 7.6* 7.5*  MG 2.2  --  1.5*  --  2.0  --  1.4* 2.2  PHOS 1.7*  --  1.9*  --  1.9*  --  2.3* 2.0*   < > = values in this interval not displayed.    GFR: Estimated Creatinine Clearance: 53 mL/min (by C-G formula based on SCr of 0.47 mg/dL).  Liver Function Tests: Recent Labs  Lab 01/20/23 0355 01/21/23 0440 01/22/23 1225 01/24/23 1100 01/25/23 0353  AST 38 64* 38 35 33  ALT 26 36 29 32 32  ALKPHOS 47 55 51 55 58  BILITOT 0.6 0.7 0.2* 0.5 0.4  PROT 6.6 6.9 6.2* 5.9* 5.9*  ALBUMIN 2.5* 2.6* 2.2* 2.1* 2.1*    CBG: Recent Labs  Lab 01/24/23 0758 01/24/23 1222 01/24/23 1659 01/25/23 0422 01/25/23 0824  GLUCAP 87 93 88 95 106*     Recent Results (from the past 240 hour(s))  Blood culture (routine x 2)     Status: None   Collection Time: 01/18/23  6:48 PM   Specimen: BLOOD RIGHT ARM  Result Value Ref Range Status   Specimen Description BLOOD RIGHT ARM  Final   Special Requests   Final    BOTTLES DRAWN AEROBIC AND ANAEROBIC Blood Culture adequate volume   Culture   Final    NO GROWTH 5 DAYS Performed at [facility], [address] [place], [place] [address]    Report Status 01/23/2023 FINAL  Final  Blood culture (routine x 2)     Status: None   Collection Time: 01/18/23  6:49 PM   Specimen: BLOOD RIGHT ARM  Result Value Ref Range Status   Specimen Description BLOOD RIGHT ARM  Final   Special Requests   Final    BOTTLES DRAWN AEROBIC AND ANAEROBIC Blood Culture adequate volume   Culture   Final    NO GROWTH  5  DAYS Performed at [facility], [address], [place], [place] [address]    Report Status 01/23/2023 FINAL  Final  Urine Culture     Status: None   Collection Time: 01/18/23  7:07 PM   Specimen: Urine, Clean Catch  Result Value Ref Range Status   Specimen Description URINE, CLEAN CATCH  Final   Special Requests NONE  Final   Culture   Final    NO GROWTH Performed at [facility], [address], [place], [place] [address]    Report Status 01/20/2023 FINAL  Final  SARS Coronavirus 2 by RT PCR (hospital order, performed in [facility] hospital lab) *cepheid single result test* Anterior Nasal Swab     Status: None   Collection Time: 01/18/23 11:09 PM   Specimen: Anterior Nasal Swab  Result Value Ref Range Status   SARS Coronavirus 2 by RT PCR NEGATIVE NEGATIVE Final    Comment: Performed at [facility], [address], [place], [place] [address]  Respiratory (~20 pathogens) panel by PCR     Status: None   Collection Time: 01/18/23 11:09 PM   Specimen: Anterior Nasal Swab; Respiratory  Result Value Ref Range Status   Adenovirus NOT DETECTED NOT DETECTED Final   Coronavirus 229E NOT DETECTED NOT DETECTED Final    Comment: (NOTE) The Coronavirus on the Respiratory Panel, DOES NOT test for the novel  Coronavirus (2019 nCoV)    Coronavirus HKU1 NOT DETECTED NOT DETECTED Final   Coronavirus NL63 NOT DETECTED NOT DETECTED Final   Coronavirus OC43 NOT DETECTED NOT DETECTED Final   Metapneumovirus NOT DETECTED NOT DETECTED Final   Rhinovirus / Enterovirus NOT DETECTED NOT DETECTED Final   Influenza Yachet Mattson NOT DETECTED NOT DETECTED Final   Influenza B NOT DETECTED NOT DETECTED Final   Parainfluenza Virus 1 NOT DETECTED NOT DETECTED Final   Parainfluenza Virus 2 NOT DETECTED NOT DETECTED Final   Parainfluenza Virus 3 NOT DETECTED NOT DETECTED Final   Parainfluenza Virus 4 NOT DETECTED NOT DETECTED Final   Respiratory Syncytial Virus NOT DETECTED NOT DETECTED Final    Bordetella pertussis NOT DETECTED NOT DETECTED Final   Bordetella Parapertussis NOT DETECTED NOT DETECTED Final   Chlamydophila pneumoniae NOT DETECTED NOT DETECTED Final   Mycoplasma pneumoniae NOT DETECTED NOT DETECTED Final    Comment: Performed at [facility], [address] [place], [place] [address]         Radiology Studies: MR BRAIN WO CONTRAST  Result Date: 01/24/2023 CLINICAL DATA:  Mental status change, unknown cause. EXAM: MRI HEAD WITHOUT CONTRAST TECHNIQUE: Multiplanar, multiecho pulse sequences of the brain and surrounding structures were obtained without intravenous contrast. COMPARISON:  CT head January 18, 2023. FINDINGS: Brain: No acute infarction, hemorrhage, hydrocephalus, extra-axial collection or mass lesion. Cerebral atrophy. Vascular: Major arterial flow voids are maintained at the skull base. Skull and upper cervical spine: Normal marrow signal. Sinuses/Orbits: Negative. Other: No mastoid effusions. IMPRESSION: 1. No evidence of acute intracranial abnormality. 2.  Cerebral Atrophy (ICD10-G31.9). Electronically Signed   By: Feliberto Harts M.D.   On: 01/24/2023 14:23   CT CHEST WO CONTRAST  Result Date: 01/23/2023 CLINICAL DATA:  Altered mental status EXAM: CT CHEST WITHOUT CONTRAST TECHNIQUE: Multidetector CT imaging of the chest was performed following the standard protocol without IV contrast. RADIATION DOSE REDUCTION: This exam was performed according to the departmental dose-optimization program which includes automated exposure control, adjustment of the mA and/or kV according to  patient size and/or use of iterative reconstruction technique. COMPARISON:  None Available. FINDINGS: Cardiovascular: Extensive multi-vessel coronary artery calcification. Global cardiac size within normal limits. No pericardial effusion. Central pulmonary arteries are of normal caliber. Mild atherosclerotic calcification within the thoracic aorta. No aortic aneurysm.  Mediastinum/Nodes: No enlarged mediastinal or axillary lymph nodes. Thyroid gland, trachea, and esophagus demonstrate no significant findings. Small hiatal hernia. Lungs/Pleura: Lungs are clear. No pleural effusion or pneumothorax. Upper Abdomen: No acute abnormality. Musculoskeletal: No chest wall mass or suspicious bone lesions identified. IMPRESSION: 1. No acute intrathoracic pathology identified. 2. Extensive multi-vessel coronary artery calcification. 3. Small hiatal hernia. Aortic Atherosclerosis (ICD10-I70.0). Electronically Signed   By: Helyn Numbers M.D.   On: 01/23/2023 22:52        Scheduled Meds:  enoxaparin (LOVENOX) injection  90 mg Subcutaneous Q24H   folic acid  1 mg Intravenous Daily   [START ON 01/27/2023] thiamine (VITAMIN B1) injection  100 mg Intravenous Daily   Continuous Infusions:  cefTRIAXone (ROCEPHIN)  IV 200 mL/hr at 01/25/23 0044   potassium chloride 20 mEq/L in dextrose 10 % 1,000 mL Pediatric IV infusion 50 mL/hr at 01/24/23 1703   thiamine (VITAMIN B1) injection 100 mL/hr at 01/25/23 0047     LOS: 7 days    Time spent: over 30 min    Lacretia Nicks, MD [facility]   To contact the attending provider between 7A-7P or the covering provider during after hours 7P-7A, please log into the web site [email or web address] and access using universal [place] password for that web site. If you do not have the password, please call the hospital operator.  01/25/2023, 9:14 AM

## 2023-01-25 NOTE — Plan of Care (Signed)
  Problem: Education: Goal: Ability to describe self-care measures that may prevent or decrease complications (Diabetes Survival Skills Education) will improve Outcome: Progressing   

## 2023-01-26 ENCOUNTER — Inpatient Hospital Stay (HOSPITAL_COMMUNITY): Payer: PPO

## 2023-01-26 DIAGNOSIS — I82412 Acute embolism and thrombosis of left femoral vein: Secondary | ICD-10-CM | POA: Diagnosis not present

## 2023-01-26 DIAGNOSIS — E876 Hypokalemia: Secondary | ICD-10-CM | POA: Diagnosis not present

## 2023-01-26 DIAGNOSIS — F03B0 Unspecified dementia, moderate, without behavioral disturbance, psychotic disturbance, mood disturbance, and anxiety: Secondary | ICD-10-CM | POA: Diagnosis not present

## 2023-01-26 DIAGNOSIS — N179 Acute kidney failure, unspecified: Secondary | ICD-10-CM | POA: Diagnosis not present

## 2023-01-26 DIAGNOSIS — Z515 Encounter for palliative care: Secondary | ICD-10-CM | POA: Diagnosis not present

## 2023-01-26 DIAGNOSIS — E43 Unspecified severe protein-calorie malnutrition: Secondary | ICD-10-CM

## 2023-01-26 DIAGNOSIS — Z66 Do not resuscitate: Secondary | ICD-10-CM | POA: Diagnosis not present

## 2023-01-26 DIAGNOSIS — R6510 Systemic inflammatory response syndrome (SIRS) of non-infectious origin without acute organ dysfunction: Secondary | ICD-10-CM

## 2023-01-26 DIAGNOSIS — I82402 Acute embolism and thrombosis of unspecified deep veins of left lower extremity: Secondary | ICD-10-CM

## 2023-01-26 DIAGNOSIS — Z7189 Other specified counseling: Secondary | ICD-10-CM | POA: Diagnosis not present

## 2023-01-26 DIAGNOSIS — L89150 Pressure ulcer of sacral region, unstageable: Secondary | ICD-10-CM | POA: Diagnosis not present

## 2023-01-26 DIAGNOSIS — G9341 Metabolic encephalopathy: Secondary | ICD-10-CM | POA: Diagnosis not present

## 2023-01-26 DIAGNOSIS — Z4682 Encounter for fitting and adjustment of non-vascular catheter: Secondary | ICD-10-CM | POA: Diagnosis not present

## 2023-01-26 DIAGNOSIS — E870 Hyperosmolality and hypernatremia: Secondary | ICD-10-CM | POA: Diagnosis not present

## 2023-01-26 DIAGNOSIS — R1310 Dysphagia, unspecified: Secondary | ICD-10-CM

## 2023-01-26 LAB — COMPREHENSIVE METABOLIC PANEL
ALT: 52 U/L — ABNORMAL HIGH (ref 0–44)
AST: 71 U/L — ABNORMAL HIGH (ref 15–41)
Albumin: 2.1 g/dL — ABNORMAL LOW (ref 3.5–5.0)
Alkaline Phosphatase: 65 U/L (ref 38–126)
Anion gap: 7 (ref 5–15)
BUN: <5 mg/dL — ABNORMAL LOW (ref 8–23)
CO2: 25 mmol/L (ref 22–32)
Calcium: 7.7 mg/dL — ABNORMAL LOW (ref 8.9–10.3)
Chloride: 109 mmol/L (ref 98–111)
Creatinine, Ser: 0.55 mg/dL (ref 0.44–1.00)
GFR, Estimated: >60 mL/min (ref >60)
Glucose, Bld: 90 mg/dL (ref 70–99)
Potassium: 3.1 mmol/L — ABNORMAL LOW (ref 3.5–5.1)
Sodium: 141 mmol/L (ref 135–145)
Total Bilirubin: 0.5 mg/dL (ref 0.3–1.2)
Total Protein: 5.9 g/dL — ABNORMAL LOW (ref 6.5–8.1)

## 2023-01-26 LAB — CBC WITH DIFFERENTIAL/PLATELET
Abs Immature Granulocytes: 0.15 10*3/uL — ABNORMAL HIGH (ref 0.00–0.07)
Basophils Absolute: 0 10*3/uL (ref 0.0–0.1)
Basophils Relative: 0 %
Eosinophils Absolute: 0.1 10*3/uL (ref 0.0–0.5)
Eosinophils Relative: 1 %
HCT: 31.5 % — ABNORMAL LOW (ref 36.0–46.0)
Hemoglobin: 10 g/dL — ABNORMAL LOW (ref 12.0–15.0)
Immature Granulocytes: 2 %
Lymphocytes Relative: 17 %
Lymphs Abs: 1.3 10*3/uL (ref 0.7–4.0)
MCH: 26.5 pg (ref 26.0–34.0)
MCHC: 31.7 g/dL (ref 30.0–36.0)
MCV: 83.3 fL (ref 80.0–100.0)
Monocytes Absolute: 0.7 10*3/uL (ref 0.1–1.0)
Monocytes Relative: 9 %
Neutro Abs: 5.3 10*3/uL (ref 1.7–7.7)
Neutrophils Relative %: 71 %
Platelets: 267 10*3/uL (ref 150–400)
RBC: 3.78 MIL/uL — ABNORMAL LOW (ref 3.87–5.11)
RDW: 15.6 % — ABNORMAL HIGH (ref 11.5–15.5)
WBC: 7.5 10*3/uL (ref 4.0–10.5)
nRBC: 0 % (ref 0.0–0.2)

## 2023-01-26 LAB — MAGNESIUM: Magnesium: 1.7 mg/dL (ref 1.7–2.4)

## 2023-01-26 LAB — GLUCOSE, CAPILLARY
Glucose-Capillary: 111 mg/dL — ABNORMAL HIGH (ref 70–99)
Glucose-Capillary: 114 mg/dL — ABNORMAL HIGH (ref 70–99)
Glucose-Capillary: 134 mg/dL — ABNORMAL HIGH (ref 70–99)
Glucose-Capillary: 74 mg/dL (ref 70–99)

## 2023-01-26 LAB — PHOSPHORUS: Phosphorus: 2.2 mg/dL — ABNORMAL LOW (ref 2.5–4.6)

## 2023-01-26 MED ORDER — OSMOLITE 1.2 CAL PO LIQD
1000.0000 mL | ORAL | Status: DC
  Administered 2023-01-26 – 2023-01-31 (×3): 1000 mL
  Filled 2023-01-26 (×8): qty 1000

## 2023-01-26 MED ORDER — FREE WATER
70.0000 mL | Status: DC
  Administered 2023-01-26 – 2023-01-27 (×4): 70 mL

## 2023-01-26 NOTE — Procedures (Signed)
Cortrak  Person Inserting Tube:  Mahala Menghini, RD Tube Type:  Cortrak - 43 inches Tube Size:  10 Tube Location:  Left nare Secured by: Bridle Technique Used to Measure Tube Placement:  Marking at nare/corner of mouth Cortrak Secured At:  65 cm   Cortrak Tube Team Note:  Consult received to place a Cortrak feeding tube.   X-ray is required, abdominal x-ray has been ordered by the Cortrak team. Please confirm tube placement before using the Cortrak tube.   If the tube becomes dislodged please keep the tube and contact the Cortrak team at [email or web address] for replacement.  If after hours and replacement cannot be delayed, place a NG tube and confirm placement with an abdominal x-ray.    Mertie Clause, MS, RD, LDN Registered Dietitian II Please see [email or web address] for contact information.

## 2023-01-26 NOTE — Progress Notes (Signed)
PHARMACY - ANTICOAGULATION   Pharmacy Consult for heparin > enoxaparin Indication: LLE DVT  No Known Allergies  Patient Measurements: Weight: 58.1 kg (128 lb) Heparin Dosing Weight: 58.1kg  Vital Signs: Temp: 98.3 F (36.8 C) (10/09 0753) Temp Source: Axillary (10/09 0753) BP: 115/61 (10/09 0753) Pulse Rate: 103 (10/09 0753)  Labs: Recent Labs    01/24/23 1100 01/25/23 0353 01/26/23 0357  HGB 10.6* 10.4* 10.0*  HCT 33.2* 32.8* 31.5*  PLT 240 240 267  CREATININE 0.45 0.47 0.55    Estimated Creatinine Clearance: 53 mL/min (by C-G formula based on SCr of 0.55 mg/dL).  Assessment: 77 y.o. female continues on Lovenox for a new DVT  Goal of Therapy:  Monitor platelets by anticoagulation protocol: Yes   Plan:  Continue Lovenox 90 mg sq Q 24 hours Daily CBC and s/sx of bleeding F/u [facility] plans once no longer NPO  Thank you Okey Regal, PharmD 01/26/2023 9:17 AM

## 2023-01-26 NOTE — Assessment & Plan Note (Addendum)
Pt still not able to take po safely. Palliative care discussed with husband. Limited trial of cortrak feedings starting 01-26-2023.  See my family meeting note from 01-28-2023.

## 2023-01-26 NOTE — Assessment & Plan Note (Addendum)
Repleted with IV magnesium. 01-27-2023. Due to refeeding syndrome/diarrhea. Have ordered IV mag 01-31-2023 resolved.

## 2023-01-26 NOTE — Hospital Course (Addendum)
77 year old woman PMH dementia, living at home, presented with acute metabolic encephalopathy.  Found to have DVT and placed on apixaban.  Noted to have severe protein calorie malnutrition.  Despite extensive workup, no reversible etiology was found and ultimately her condition felt to be secondary to progressive dementia.  Trial of tube feeds was pursued, family does not want PEG tube.  Patient unable to maintain nutrition and hospice recommended.  Long-term care was pursued. Ultimately plan changed, with plan now for discharge home with hospice once all equipment is in place  Consultants Palliative medicine   Procedures None

## 2023-01-26 NOTE — Progress Notes (Signed)
Initial Nutrition Assessment  DOCUMENTATION CODES:   Severe malnutrition in context of chronic illness  INTERVENTION:  Continue diet order as recommended per SLP Request updated measured weight Initiated TF via Cortrak: Start Osmolite 1.2 at 46ml/h and advance by 10ml q8h to a goal rate of 53ml/hr ( per day) 70ml free water flushes q4h TF at goal provides 1440 kcal, 87g protein, total free water  NUTRITION DIAGNOSIS:   Severe Malnutrition related to chronic illness (moderate dementia) as evidenced by severe fat depletion, severe muscle depletion.  GOAL:   Patient will meet greater than or equal to 90% of their needs  MONITOR:   PO intake, Labs, Weight trends, Diet advancement, TF tolerance  REASON FOR ASSESSMENT:   Other (Comment) (Cortrak list)    ASSESSMENT:   Pt admitted with AMS and HTN. PMH significant for moderate dementia.   10/7: SLP advanced diet to dysphagia 1, thin liquids 10/8: Cortrak placement (tip in distal stomach/proximal duodenum)  PMT following for ongoing GOC. Plans to trial cortrak.  Spoke with pt's husband at bedside. He states that she eats well but since her admission for UTI in June and subsequent admission to rehab for 6-7 weeks following admission, she has been eating about 25% less than she was prior to that. They eat take out often. Though her intake was less, she still continued to consume 3 smaller meals per day with some snacks (which included fruit) daily. She does not enjoy nutrition supplements but she would occasionally drink fresh fruit/vegetable juices. Last meal was Saturday/Sunday prior to admit.  Pt's husband states that her usual weight prior to June was about 138 lbs. She lost weight down to 125 lbs at time of discharge from rehab. She had since gained back about 2 lbs. Last documented weight was on admission, uncertain whether this weight is actual versus stated. Will request updated measured weight to monitor changes  throughout admission.   Medications: folic acid, thiamine  Labs: potassium 3.1, BUN <5, Phos 2.2, AST 71, ALT 52, CBG's 71-106 x24 hours  NUTRITION - FOCUSED PHYSICAL EXAM:  Flowsheet Row Most Recent Value  Orbital Region Moderate depletion  Upper Arm Region Severe depletion  Thoracic and Lumbar Region Severe depletion  Buccal Region Severe depletion  Temple Region Moderate depletion  Clavicle Bone Region Severe depletion  Clavicle and Acromion Bone Region Severe depletion  Scapular Bone Region Severe depletion  Dorsal Hand Unable to assess  [in mits]  Patellar Region Moderate depletion  Anterior Thigh Region Moderate depletion  Posterior Calf Region Moderate depletion  Edema (RD Assessment) None  Hair Reviewed  Eyes Unable to assess  Mouth Unable to assess  Skin Reviewed  Nails Unable to assess       Diet Order:   Diet Order             DIET - DYS 1 Room service appropriate? No; Fluid consistency: Thin  Diet effective now                   EDUCATION NEEDS:   Education needs have been addressed  Skin:  Skin Assessment: Skin Integrity Issues: Skin Integrity Issues:: DTI, Stage II DTI: mid sacrum Stage II: R thigh  Last BM:  10/9 (type 6 large)  Height:   Ht Readings from Last 1 Encounters:  10/08/22 5\' 5"  (1.651 m)    Weight:   Wt Readings from Last 1 Encounters:  01/19/23 58.1 kg   BMI:  Body mass index is 21.3 kg/m.  Estimated Nutritional Needs:   Kcal:  1400-1600  Protein:  70-85g  Fluid:  >/=1.5L  Drusilla Kanner, RDN, LDN Clinical Nutrition

## 2023-01-26 NOTE — Progress Notes (Signed)
   Palliative Medicine Inpatient Follow Up Note HPI: 77 y.o. female  with past medical history of moderate dementia admitted on 01/18/2023 with AMS and poor PO intake. She was found to have AKI and hypernatremia.   Palliative care has been asked to get involved to further assist with goals of care conversations.   Today's Discussion 01/26/2023  *Please note that this is a verbal dictation therefore any spelling or grammatical errors are due to the "[employer]agon [employer] One" system interpretation.  Chart reviewed inclusive of vital signs, progress notes, laboratory results, and diagnostic images.   Patients family overnight per Dr. Roanna Banning documentation had made the decision to pursue DNAR/DNI code status.   I met with Hadleigh at bedside this morning. She is disoriented and pulling her gown off. She did say a few words this morning though they were non-sensicle.   At this time plan is to place coretrack feeding tube for artificial support. Ongoing conversations pending patients improvements or declines.   Per patients RN, Erik Obey has been pleasantly confused. Coretrack has been placed.   I met with patients spouse, [name] at bedside later in the morning and confirmed the above.   Questions and concerns addressed/Palliative Support Provided.   Objective Assessment: Vital Signs Vitals:   01/26/23 0316 01/26/23 0753  BP: (!) 113/90 115/61  Pulse: (!) 103 (!) 103  Resp: 20 17  Temp: 98 F (36.7 C) 98.3 F (36.8 C)  SpO2: 100% 100%    Intake/Output Summary (Last 24 hours) at 01/26/2023 0919 Last data filed at 01/25/2023 2210 Gross per 24 hour  Intake 1047.5 ml  Output --  Net 1047.5 ml   Last Weight  Most recent update: 01/19/2023  3:37 AM    Weight  58.1 kg (128 lb)            Gen:  Frail Elderly AA F chronically ill in appearance HEENT: Dry mucous membranes CV: Regular rate and rhythm  PULM:  On RA, breathing is even and nonlabored ABD: soft/nontender  EXT: (+) muscle  wasting (-) edema  Neuro: Lethargic  SUMMARY OF RECOMMENDATIONS   DNAR/DNI  Plan for coretrack placement today to see how Shantay does  Ongoing PMT support --> Sarina Ser will resume seeing Jakelyn on Friday  Time - 35 ______________________________________________________________________________________ Lamarr Lulas  Palliative Medicine Team Team Cell Phone: [phone] Please utilize secure chat with additional questions, if there is no response within 30 minutes please call the above phone number  Palliative Medicine Team providers are available by phone from 7am to 7pm daily and can be reached through the team cell phone.  Should this patient require assistance outside of these hours, please call the patient's attending physician.

## 2023-01-26 NOTE — Assessment & Plan Note (Signed)
See RD note from 01-26-2023 Severe Malnutrition related to chronic illness (moderate dementia) as evidenced by severe fat depletion, severe muscle depletion.

## 2023-01-26 NOTE — Subjective & Objective (Addendum)
Pt seen and examined. No family at bedside. Remains with Cortrak Has added prn IV morphine for severe pain and liquid hydrocodone for moderate pain via NG tube.  Had 2 doses of liquid hydrocodone in last 24 hours. 1318 yesterday and 0447 this AM.  Pt is moaning this AM. Looks uncomfortable. Still with flexiseal system with liquid stool

## 2023-01-26 NOTE — Progress Notes (Signed)
PROGRESS NOTE    [name]  ZHY:[id number] DOB: [birth year] DOA: 01/18/2023 PCP: [name], MD  Subjective: Pt seen and examined. No family at bedside. RD at bedside to place cortrak. Pt remains in mittens. Pt is disoriented x 3.    Hospital Course: HPI: Amanda [name] is a 77 y.o. female with medical history significant of moderate dementia.   Pt with 24h of progressively worsening AMS, now poorly responsive with GCS 7   BGL okay   H/o PNA and UTI causing sepsis back in June.   Family reports decreased PO intake recently "can't get her to eat anything".  They wonder about dental abscess.   Also has known decubitus ulcer.  Significant Events: Admitted 01/18/2023 for acute metabolic encephalopathy, hypernatremia and AKI   Significant Labs: Admitting WBC of 17K, Na 149, Scr 2.36  Significant Imaging Studies: Admitting MRI brain negative for acute CVA  Antibiotic Therapy: Anti-infectives (From admission, onward)    Start     Dose/Rate Route Frequency Ordered Stop   01/19/23 1900  ceFEPIme (MAXIPIME) 2 g in sodium chloride 0.9 % 100 mL IVPB  Status:  Discontinued        2 g 200 mL/hr over 30 Minutes Intravenous Every 24 hours 01/19/23 0338 01/19/23 0945   01/19/23 1000  cefTRIAXone (ROCEPHIN) 2 g in sodium chloride 0.9 % 100 mL IVPB       Note to Pharmacy: Retime as indicated   2 g 200 mL/hr over 30 Minutes Intravenous Every 24 hours 01/19/23 0945 01/25/23 1325   01/19/23 0342  vancomycin variable dose per unstable renal function (pharmacist dosing)  Status:  Discontinued         Does not apply See admin instructions 01/19/23 0343 01/20/23 0718   01/18/23 1830  vancomycin (VANCOREADY) IVPB 1250 mg/250 mL        1,250 mg 166.7 mL/hr over 90 Minutes Intravenous  Once 01/18/23 1818 01/18/23 2122   01/18/23 1815  ceFEPIme (MAXIPIME) 2 g in sodium chloride 0.9 % 100 mL IVPB        2 g 200 mL/hr over 30 Minutes Intravenous  Once 01/18/23 1807 01/18/23 1911        Procedures: 01-21-2023 EEG with moderate to severe diffuse encephalopathy, no seizures or epileptiform discharges Cortrak placed 01-26-2023  Consultants: Palliative Care    Assessment and Plan: * Acute metabolic encephalopathy Multifactorial. Admission Sodium 157 and BUN 138 definitely contributing / causing, some or all of this. Likely multifactorial (hypernatremia, uremia/AKI, dementia) but inciting cause unclear.  No clear infectious cause (CXR without active disease, CT maxillofacial without notable findings, CT abd/pelvis without acute findings, decub doesn't appear infected) TSH wnl, ammonia wnl  B12 (elevated), folate low -> supplement EEG with moderate to severe diffuse encephalopathy, no seizures or epileptiform discharges CT head without acute abnormality MRI brain without acute abnormality   Pt's hypernatremia and AKI resolved with IVF  Pt's mentation has not significantly improved.  Palliative care consulted and they discussed with pt's husband on 01-25-2023. Limited Cortrak feedings will start today. Cortrak placed on 01-25-2023.  SIRS (systemic inflammatory response syndrome) (HCC) Initial tachycardia, WBC 17k.  Pt had SIRS due to AKI and dehydration. Sepsis ruled out.  Procal was elevated at 0.32. pt given IV Rocephin for 7 days. But pt never had a clear source of infection.  All urine and blood cx were negative. RVP also negative.   Hypernatremia Severe dehydration due to decreased PO intake. Treated with 1.5L LR bolus  in ED for question of sepsis. Sepsis ruled out.  Pt then given D5W at 75 cc/hr for hypernatremia. Pt's hypernatremia resolved after IVF.  Dysphagia Pt still not able to take po safely. Palliative care discussed with husband. Limited trial of cortrak feedings starting 01-26-2023.  Decubitus ulcer Wound care consult on pt 01-19-2023 No obvious erythema, drainage, crepitus. Location: Sacral Pressure Injury POA: Yes.  Measurement:7cm x 7cm x 0.1cm  (this included the area of deep tissue injury) with the partial thickness scattered skin loss  AKI (acute kidney injury) (HCC) On admission, AKI Very likely pre-renal / ATN with a very elevated BUN:Cr ratio, clinically appears dehydrated, and sodium of 157 today.  Pt treated with IVF and her AKI resolved.  Moderate dementia (HCC) Dementia for years. at baseline, wheelchair bound (walked 80 ft with walker with assistance in early September at rehab - wheelchair bound since being home), she's disoriented typically, but will communicate.   On admission, pt had acute encephalopathy with initial GCS 7. At baseline, she is confused to time and location.   Hypertension Chart diagnosis, doesn't appear to be on any chronic meds for this.  Hypomagnesemia Repleted with IV magnesium. Now resolved.  Hypokalemia Due to poor po intake. Repleted with IV KCL. Now resolved.  DNR (do not resuscitate)/DNI(Do Not Intubate) Pt made DNR/DNI by Dr. Lowell Guitar on 01-25-2023.  Acute deep vein thrombosis (DVT) of left lower extremity (HCC) Seen on LE U/S. Initially placed on IV heparin. Changed to SQ lovenox 90 mg SQ qday.   DVT prophylaxis:   Lovenox   Code Status: Do not attempt resuscitation (DNR) - Comfort care Family Communication: no family at bedside Disposition Plan: undetermined Reason for continuing need for hospitalization: starting Cortrak feeding today.  Objective: Vitals:   01/25/23 1625 01/25/23 2310 01/26/23 0316 01/26/23 0753  BP: 135/86 108/78 (!) 113/90 115/61  Pulse: 93 100 (!) 103 (!) 103  Resp: 20 19 20 17   Temp: (!) 97.5 F (36.4 C) 98.2 F (36.8 C) 98 F (36.7 C) 98.3 F (36.8 C)  TempSrc: Axillary Axillary Axillary Axillary  SpO2: 100% 100% 100% 100%  Weight:        Intake/Output Summary (Last 24 hours) at 01/26/2023 1142 Last data filed at 01/25/2023 2210 Gross per 24 hour  Intake 100 ml  Output --  Net 100 ml   Filed Weights   01/19/23 0315  Weight: 58.1 kg     Examination:  Physical Exam Vitals and nursing note reviewed.  Constitutional:      Comments: Awake but disoriented. Chronically ill appearing  HENT:     Head: Normocephalic and atraumatic.     Nose: Nose normal.  Eyes:     General: No scleral icterus. Cardiovascular:     Rate and Rhythm: Normal rate and regular rhythm.  Pulmonary:     Effort: Pulmonary effort is normal. No respiratory distress.     Breath sounds: Normal breath sounds.  Abdominal:     General: Bowel sounds are normal. There is no distension.  Musculoskeletal:     Left lower leg: 1+ Edema present.     Left ankle: Swelling present.     Left foot: Swelling present.  Skin:    General: Skin is warm and dry.     Capillary Refill: Capillary refill takes less than 2 seconds.  Neurological:     Mental Status: She is disoriented.     Data Reviewed: I have personally reviewed following labs and imaging studies  CBC: Recent Labs  Lab 01/21/23 0440 01/22/23 1225 01/23/23 0840 01/24/23 1100 01/25/23 0353 01/26/23 0357  WBC 11.3* 10.8* 12.8* 10.7* 9.6 7.5  NEUTROABS 8.4* 7.8*  --  8.4* 7.5 5.3  HGB 10.9* 10.7* 11.2* 10.6* 10.4* 10.0*  HCT 36.2 35.5* 36.7 33.2* 32.8* 31.5*  MCV 87.2 83.9 83.4 82.8 82.6 83.3  PLT 185 194 252 240 240 267   Basic Metabolic Panel: Recent Labs  Lab 01/22/23 1225 01/23/23 0101 01/23/23 0840 01/23/23 1946 01/24/23 1100 01/25/23 0353 01/26/23 0357  NA 144 144  --  144 137 138 141  K 3.4* 3.7  --  3.4* 2.8* 3.2* 3.1*  CL 106 109  --  105 106 106 109  CO2 23 24  --  23 24 23 25   GLUCOSE 103* 92  --  113* 92 108* 90  BUN 8 6*  --  <5* <5* <5* <5*  CREATININE 0.64 0.62  --  0.57 0.45 0.47 0.55  CALCIUM 8.0* 8.1*  --  8.2* 7.6* 7.5* 7.7*  MG 1.5*  --  2.0  --  1.4* 2.2 1.7  PHOS 1.9*  --  1.9*  --  2.3* 2.0* 2.2*   GFR: Estimated Creatinine Clearance: 53 mL/min (by C-G formula based on SCr of 0.55 mg/dL). Liver Function Tests: Recent Labs  Lab 01/21/23 0440  01/22/23 1225 01/24/23 1100 01/25/23 0353 01/26/23 0357  AST 64* 38 35 33 71*  ALT 36 29 32 32 52*  ALKPHOS 55 51 55 58 65  BILITOT 0.7 0.2* 0.5 0.4 0.5  PROT 6.9 6.2* 5.9* 5.9* 5.9*  ALBUMIN 2.6* 2.2* 2.1* 2.1* 2.1*   Recent Labs  Lab 01/19/23 1619 01/20/23 0401 01/21/23 0440 01/22/23 1225 01/24/23 1100  LIPASE 383* 261* 196* 121* 49   CBG: Recent Labs  Lab 01/25/23 0422 01/25/23 0824 01/25/23 1156 01/25/23 1629 01/26/23 0758  GLUCAP 95 106* 95 71 74    Recent Results (from the past 240 hour(s))  Blood culture (routine x 2)     Status: None   Collection Time: 01/18/23  6:48 PM   Specimen: BLOOD RIGHT ARM  Result Value Ref Range Status   Specimen Description BLOOD RIGHT ARM  Final   Special Requests   Final    BOTTLES DRAWN AEROBIC AND ANAEROBIC Blood Culture adequate volume   Culture   Final    NO GROWTH 5 DAYS Performed at [facility], [address], [place], [place] [address]    Report Status 01/23/2023 FINAL  Final  Blood culture (routine x 2)     Status: None   Collection Time: 01/18/23  6:49 PM   Specimen: BLOOD RIGHT ARM  Result Value Ref Range Status   Specimen Description BLOOD RIGHT ARM  Final   Special Requests   Final    BOTTLES DRAWN AEROBIC AND ANAEROBIC Blood Culture adequate volume   Culture   Final    NO GROWTH 5 DAYS Performed at [facility], [address] [place], [place] [address]    Report Status 01/23/2023 FINAL  Final  Urine Culture     Status: None   Collection Time: 01/18/23  7:07 PM   Specimen: Urine, Clean Catch  Result Value Ref Range Status   Specimen Description URINE, CLEAN CATCH  Final   Special Requests NONE  Final   Culture   Final    NO GROWTH Performed at [facility], [address] [place], [place] [address]    Report Status 01/20/2023 FINAL  Final  SARS Coronavirus 2 by RT PCR (hospital order, performed in [facility] hospital lab) *cepheid single result test* Anterior Nasal Swab      Status: None   Collection Time: 01/18/23 11:09 PM   Specimen: Anterior Nasal Swab  Result Value Ref Range Status   SARS Coronavirus 2 by RT PCR NEGATIVE NEGATIVE Final    Comment: Performed at [facility], [address], [place], [place] [address]  Respiratory (~20 pathogens) panel by PCR     Status: None   Collection Time: 01/18/23 11:09 PM   Specimen: Anterior Nasal Swab; Respiratory  Result Value Ref Range Status   Adenovirus NOT DETECTED NOT DETECTED Final   Coronavirus 229E NOT DETECTED NOT DETECTED Final    Comment: (NOTE) The Coronavirus on the Respiratory Panel, DOES NOT test for the novel  Coronavirus (2019 nCoV)    Coronavirus HKU1 NOT DETECTED NOT DETECTED Final   Coronavirus NL63 NOT DETECTED NOT DETECTED Final   Coronavirus OC43 NOT DETECTED NOT DETECTED Final   Metapneumovirus NOT DETECTED NOT DETECTED Final   Rhinovirus / Enterovirus NOT DETECTED NOT DETECTED Final   Influenza A NOT DETECTED NOT DETECTED Final   Influenza B NOT DETECTED NOT DETECTED Final   Parainfluenza Virus 1 NOT DETECTED NOT DETECTED Final   Parainfluenza Virus 2 NOT DETECTED NOT DETECTED Final   Parainfluenza Virus 3 NOT DETECTED NOT DETECTED Final   Parainfluenza Virus 4 NOT DETECTED NOT DETECTED Final   Respiratory Syncytial Virus NOT DETECTED NOT DETECTED Final   Bordetella pertussis NOT DETECTED NOT DETECTED Final   Bordetella Parapertussis NOT DETECTED NOT DETECTED Final   Chlamydophila pneumoniae NOT DETECTED NOT DETECTED Final   Mycoplasma pneumoniae NOT DETECTED NOT DETECTED Final    Comment: Performed at [facility], [address] [place], [place] [address]     Radiology Studies: DG Abd Portable 1V  Result Date: 01/26/2023 CLINICAL DATA:  Feeding tube placement. EXAM: PORTABLE ABDOMEN - 1 VIEW COMPARISON:  CT 01/18/2023 FINDINGS: Tip of the weighted enteric tube in the right upper quadrant of the abdomen in the region of the distal stomach or proximal duodenum. No  small bowel dilatation in the included upper abdomen. IMPRESSION: Tip of the weighted enteric tube in the distal stomach or proximal duodenum. Electronically Signed   By: Narda Rutherford M.D.   On: 01/26/2023 10:34    Scheduled Meds:  enoxaparin (LOVENOX) injection  90 mg Subcutaneous Q24H   folic acid  1 mg Intravenous Daily   [START ON 01/27/2023] thiamine (VITAMIN B1) injection  100 mg Intravenous Daily   Continuous Infusions:  potassium chloride 20 mEq/L in dextrose 10 % 1,000 mL Pediatric IV infusion 50 mL/hr at 01/26/23 1059     LOS: 8 days   Time spent: 45 minutes  Carollee Herter, DO  [facility]  01/26/2023, 11:42 AM

## 2023-01-26 NOTE — Assessment & Plan Note (Signed)
Due to poor po intake. Repleted with IV KCL.  01-27-2023. Due to refeeding syndrome/diarrhea. Have started scheduled kcl for potassium replacement.

## 2023-01-26 NOTE — Assessment & Plan Note (Signed)
Seen on LE U/S. Initially placed on IV heparin. Changed to SQ lovenox 90 mg SQ qday.

## 2023-01-26 NOTE — Assessment & Plan Note (Signed)
Pt made DNR/DNI by Dr. Lowell Guitar on 01-25-2023.

## 2023-01-26 NOTE — Progress Notes (Signed)
Speech Language Pathology Treatment: Dysphagia  Patient Details Name: Amanda [name] MRN: [id number] DOB: 1945-08-17 Today's Date: 01/26/2023 Time: 4098-1191 SLP Time Calculation (min) (ACUTE ONLY): 33 min  Assessment / Plan / Recommendation Clinical Impression  Patient seen by SLP for skilled intervention focused on family education and diet toleration. Patient was asleep and difficult to rouse when SLP entered the room, but eventually woke to sound of voice and remained alert enough to attend to POs. Patient initiated some verbalizations at the word and phrase level with few words consisting of relevant content. SLP completed orally care to which the patient was compliant with. She remained partially reclined and to the right side of her bed despite SLP repositioning her several times. Pudding thick consistency (MagicCup) via teaspoon was then administered. Patient responded best when food was placed at the front of the spoon. Required moderate verbal and tactile cueing to initiate bites, but swallow was consistently observed. About 20% of MagicCup was fed to the patient before she began to show signs of noncompliance. SLP will plan to f/u at a later date to continue family education and continue to attempt puree and thin POs.   HPI HPI: Amanda [name] is a 77 yo female presenting to ED 10/1 with worsening AMS and poor PO intake. Admitted with AKI and hypernatremia. Seen by SLP June 2024 with cognitively based dysphagia c/b sustained attention deficits affecting mastication and oral transit. Per notes at that time, pt orally holds foods she does not like and her husband is typically present to order desired food choices and provide careful hand feeding. PMH includes moderate dementia      SLP Plan  Continue with current plan of care      Recommendations for follow up therapy are one component of a multi-disciplinary discharge planning process, led by the attending physician.   Recommendations may be updated based on patient status, additional functional criteria and insurance authorization.    Recommendations  Diet recommendations: Dysphagia 1 (puree);Thin liquid Liquids provided via: Teaspoon;Cup Medication Administration: Crushed with puree Supervision: Staff to assist with self feeding;Full supervision/cueing for compensatory strategies Compensations: Minimize environmental distractions;Slow rate;Small sips/bites Postural Changes and/or Swallow Maneuvers: Seated upright 90 degrees;Upright 30-60 min after meal                  Oral care QID;Staff/trained caregiver to provide oral care;Oral care prior to ice chip/H20   Frequent or constant Supervision/Assistance Dysphagia, unspecified (R13.10)     Continue with current plan of care     Marline Backbone, Senaida Lange., Speech Therapy Student   01/26/2023, 4:25 PM

## 2023-01-27 DIAGNOSIS — E870 Hyperosmolality and hypernatremia: Secondary | ICD-10-CM | POA: Diagnosis not present

## 2023-01-27 DIAGNOSIS — E878 Other disorders of electrolyte and fluid balance, not elsewhere classified: Secondary | ICD-10-CM

## 2023-01-27 DIAGNOSIS — G9341 Metabolic encephalopathy: Secondary | ICD-10-CM | POA: Diagnosis not present

## 2023-01-27 DIAGNOSIS — I82412 Acute embolism and thrombosis of left femoral vein: Secondary | ICD-10-CM | POA: Diagnosis not present

## 2023-01-27 LAB — CBC
HCT: 30.8 % — ABNORMAL LOW (ref 36.0–46.0)
Hemoglobin: 9.8 g/dL — ABNORMAL LOW (ref 12.0–15.0)
MCH: 26.3 pg (ref 26.0–34.0)
MCHC: 31.8 g/dL (ref 30.0–36.0)
MCV: 82.6 fL (ref 80.0–100.0)
Platelets: 258 10*3/uL (ref 150–400)
RBC: 3.73 MIL/uL — ABNORMAL LOW (ref 3.87–5.11)
RDW: 15.9 % — ABNORMAL HIGH (ref 11.5–15.5)
WBC: 11.4 10*3/uL — ABNORMAL HIGH (ref 4.0–10.5)
nRBC: 0 % (ref 0.0–0.2)

## 2023-01-27 LAB — COMPREHENSIVE METABOLIC PANEL
ALT: 52 U/L — ABNORMAL HIGH (ref 0–44)
AST: 49 U/L — ABNORMAL HIGH (ref 15–41)
Albumin: 2.1 g/dL — ABNORMAL LOW (ref 3.5–5.0)
Alkaline Phosphatase: 69 U/L (ref 38–126)
Anion gap: 10 (ref 5–15)
BUN: <5 mg/dL — ABNORMAL LOW (ref 8–23)
CO2: 24 mmol/L (ref 22–32)
Calcium: 7.7 mg/dL — ABNORMAL LOW (ref 8.9–10.3)
Chloride: 106 mmol/L (ref 98–111)
Creatinine, Ser: 0.58 mg/dL (ref 0.44–1.00)
GFR, Estimated: >60 mL/min (ref >60)
Glucose, Bld: 114 mg/dL — ABNORMAL HIGH (ref 70–99)
Potassium: 3.1 mmol/L — ABNORMAL LOW (ref 3.5–5.1)
Sodium: 140 mmol/L (ref 135–145)
Total Bilirubin: 0.4 mg/dL (ref 0.3–1.2)
Total Protein: 5.9 g/dL — ABNORMAL LOW (ref 6.5–8.1)

## 2023-01-27 LAB — GLUCOSE, CAPILLARY
Glucose-Capillary: 137 mg/dL — ABNORMAL HIGH (ref 70–99)
Glucose-Capillary: 112 mg/dL — ABNORMAL HIGH (ref 70–99)
Glucose-Capillary: 113 mg/dL — ABNORMAL HIGH (ref 70–99)
Glucose-Capillary: 128 mg/dL — ABNORMAL HIGH (ref 70–99)
Glucose-Capillary: 88 mg/dL (ref 70–99)

## 2023-01-27 LAB — PHOSPHORUS: Phosphorus: 2.3 mg/dL — ABNORMAL LOW (ref 2.5–4.6)

## 2023-01-27 LAB — MAGNESIUM: Magnesium: 1.4 mg/dL — ABNORMAL LOW (ref 1.7–2.4)

## 2023-01-27 MED ORDER — ACETAMINOPHEN 160 MG/5ML PO SOLN: 650.0000 mg | Freq: Four times a day (QID) | ORAL | Status: DC | PRN

## 2023-01-27 MED ORDER — POTASSIUM CHLORIDE 20 MEQ PO PACK
20.0000 meq | PACK | Freq: Two times a day (BID) | ORAL | Status: DC
  Administered 2023-01-27 – 2023-02-01 (×11): 20 meq
  Filled 2023-01-27 (×11): qty 1

## 2023-01-27 MED ORDER — MORPHINE SULFATE (PF) 2 MG/ML IV SOLN
1.0000 mg | Freq: Once | INTRAVENOUS | Status: AC
  Administered 2023-01-27: 1 mg via INTRAVENOUS
  Filled 2023-01-27: qty 1

## 2023-01-27 MED ORDER — MAGNESIUM GLUCONATE NICU ORAL SYRINGE 54MG/5ML: 200.0000 mg | Freq: Two times a day (BID) | ORAL | Status: DC

## 2023-01-27 MED ORDER — BANATROL TF EN LIQD
60.0000 mL | Freq: Two times a day (BID) | ENTERAL | Status: DC
  Administered 2023-01-27 – 2023-02-01 (×11): 60 mL
  Filled 2023-01-27 (×11): qty 60

## 2023-01-27 MED ORDER — FREE WATER
100.0000 mL | Status: DC
  Administered 2023-01-27 – 2023-01-30 (×20): 100 mL

## 2023-01-27 MED ORDER — MAGNESIUM SULFATE 4 GM/100ML IV SOLN
4.0000 g | Freq: Once | INTRAVENOUS | Status: AC
  Administered 2023-01-27: 4 g via INTRAVENOUS
  Filled 2023-01-27: qty 100

## 2023-01-27 NOTE — Progress Notes (Signed)
Speech Language Pathology Treatment: Dysphagia  Patient Details Name: Amanda [name] MRN: [id number] DOB: 1945-07-25 Today's Date: 01/27/2023 Time: 0910-0925 SLP Time Calculation (min) (ACUTE ONLY): 15 min  Assessment / Plan / Recommendation Clinical Impression  Patient seen for skilled intervention focused on PO acceptance. Patient was asleep when SLP entered the room and was difficult to rouse, requiring cues throughout the session. No family was present at bedside. SLP completed oral care before PO administration. Pudding thick (MagicCup) consistency was administered via teaspoon. Patient accepted ~4 bites, however no swallow was initiated. Bolus was pocketed and eventually anteriorly spilled out. Suction was required to clear bolus from oral cavity. SLP recommends continuation of Dys 1 (puree) and thin liquid diet for comfort measures. Continue only offering POs when the patient is awake, alert, and accepting. ST to s/o at this time, please reorder as needed.   HPI HPI: Amanda [name] is a 77 yo female presenting to ED 10/1 with worsening AMS and poor PO intake. Admitted with AKI and hypernatremia. Seen by SLP June 2024 with cognitively based dysphagia c/b sustained attention deficits affecting mastication and oral transit. Per notes at that time, pt orally holds foods she does not like and her husband is typically present to order desired food choices and provide careful hand feeding. PMH includes moderate dementia      SLP Plan         Recommendations for follow up therapy are one component of a multi-disciplinary discharge planning process, led by the attending physician.  Recommendations may be updated based on patient status, additional functional criteria and insurance authorization.    Recommendations  Diet recommendations: Dysphagia 1 (puree);Thin liquid Liquids provided via: Teaspoon;Cup Medication Administration: Crushed with puree Supervision: Staff to assist with  self feeding;Full supervision/cueing for compensatory strategies Compensations: Monitor for anterior loss;Slow rate;Small sips/bites;Minimize environmental distractions Postural Changes and/or Swallow Maneuvers: Seated upright 90 degrees;Upright 30-60 min after meal                  Oral care QID;Staff/trained caregiver to provide oral care;Oral care prior to ice chip/H20   Frequent or constant Supervision/Assistance Dysphagia, unspecified (R13.10)           Marline Backbone, B.S., Speech Therapy Student    01/27/2023, 10:33 AM

## 2023-01-27 NOTE — Plan of Care (Signed)

## 2023-01-27 NOTE — Progress Notes (Addendum)
Patient has multiple episodes of diarrhea in the setting of refeeding syndrome. - Ordered rectal tube.  Bedside nurse reporting patient is grimacing due to pain.  Unable to locate why she is hurting.  Requesting for liquid Tylenol. -Changed oral Tylenol tablet to liquid form per patient's preference. -Bedside nurse reported that even with the Tylenol patient is still having a lot of pain around her ulcer area.  Giving  morphine 1 mg just one-time dose.   Tereasa Coop, MD [facility] 01/27/2023, 11:10 PM

## 2023-01-27 NOTE — Progress Notes (Signed)
PROGRESS NOTE    Amanda [name]  NWG:[id number] DOB: 03/26/1946 DOA: 01/18/2023 PCP: Amanda Nian, MD  Subjective: Pt seen and examined. No family at bedside. RD at bedside to place cortrak yesterday.  Tube feeds started.  RN states pt having diarrhea today since TF started. Pt remains in mittens. Pt is disoriented x 3.    Hospital Course: HPI: Amanda [name] is a 77 y.o. female with medical history significant of moderate dementia.   Pt with 24h of progressively worsening AMS, now poorly responsive with GCS 7   BGL okay   H/o PNA and UTI causing sepsis back in June.   Family reports decreased PO intake recently "can't get her to eat anything".  They wonder about dental abscess.   Also has known decubitus ulcer.  Significant Events: Admitted 01/18/2023 for acute metabolic encephalopathy, hypernatremia and AKI 01-26-2023. Cortrak placed and tube feeding started 01-27-2023 refeeding syndrome/diarrhea started.  Significant Labs: Admitting WBC of 17K, Na 149, Scr 2.36  Significant Imaging Studies: Admitting MRI brain negative for acute CVA  Antibiotic Therapy: Anti-infectives (From admission, onward)    Start     Dose/Rate Route Frequency Ordered Stop   01/19/23 1900  ceFEPIme (MAXIPIME) 2 g in sodium chloride 0.9 % 100 mL IVPB  Status:  Discontinued        2 g 200 mL/hr over 30 Minutes Intravenous Every 24 hours 01/19/23 0338 01/19/23 0945   01/19/23 1000  cefTRIAXone (ROCEPHIN) 2 g in sodium chloride 0.9 % 100 mL IVPB       Note to Pharmacy: Retime as indicated   2 g 200 mL/hr over 30 Minutes Intravenous Every 24 hours 01/19/23 0945 01/25/23 1325   01/19/23 0342  vancomycin variable dose per unstable renal function (pharmacist dosing)  Status:  Discontinued         Does not apply See admin instructions 01/19/23 0343 01/20/23 0718   01/18/23 1830  vancomycin (VANCOREADY) IVPB 1250 mg/250 mL        1,250 mg 166.7 mL/hr over 90 Minutes Intravenous  Once  01/18/23 1818 01/18/23 2122   01/18/23 1815  ceFEPIme (MAXIPIME) 2 g in sodium chloride 0.9 % 100 mL IVPB        2 g 200 mL/hr over 30 Minutes Intravenous  Once 01/18/23 1807 01/18/23 1911       Procedures: 01-21-2023 EEG with moderate to severe diffuse encephalopathy, no seizures or epileptiform discharges Cortrak placed 01-26-2023  Consultants: Palliative Care    Assessment and Plan: * Acute metabolic encephalopathy On admission, pt's etiology of her acute metabolic encephalopathy was/is multifactorial. Admission Sodium 157 and BUN 138 definitely contributing / causing, some or all of this. Likely multifactorial (hypernatremia, uremia/AKI, dementia) but inciting cause unclear.  No clear infectious cause (CXR without active disease, CT maxillofacial without notable findings, CT abd/pelvis without acute findings, decub doesn't appear infected) TSH wnl, ammonia wnl  B12 (elevated), folate low -> supplement EEG with moderate to severe diffuse encephalopathy, no seizures or epileptiform discharges CT head without acute abnormality MRI brain without acute abnormality   Pt's mental status has not improved since admission. This is likely progressive decline of her already established dementia.  Pt's hypernatremia and AKI resolved with IVF  Pt's mentation has not significantly improved.  Palliative care consulted and they discussed with pt's husband on 01-25-2023. Limited Cortrak feedings will start today. Cortrak placed on 01-25-2023.  Refeeding syndrome Pt with diarrhea after tube feeds started. RD managing pt's diarrhea with slower TF  rate and adding fiber to feeds. Will keep electrolytes balance with liquid Kcl via tube and IV magnesium.  SIRS (systemic inflammatory response syndrome) (HCC) Initial tachycardia, WBC 17k.  Pt had SIRS due to AKI and dehydration. Sepsis ruled out.  Procal was elevated at 0.32. pt given IV Rocephin for 7 days. But pt never had a clear source of infection.   All urine and blood cx were negative. RVP also negative.   Hypernatremia Severe dehydration due to decreased PO intake. Treated with 1.5L LR bolus in ED for question of sepsis. Sepsis ruled out.  Pt then given D5W at 75 cc/hr for hypernatremia. Pt's hypernatremia resolved after IVF.  Dysphagia Pt still not able to take po safely. Palliative care discussed with husband. Limited trial of cortrak feedings starting 01-26-2023.  Decubitus ulcer Wound care consult on pt 01-19-2023 No obvious erythema, drainage, crepitus. Location: Sacral Pressure Injury POA: Yes.  Measurement:7cm x 7cm x 0.1cm (this included the area of deep tissue injury) with the partial thickness scattered skin loss  AKI (acute kidney injury) (HCC) On admission, AKI Very likely pre-renal / ATN with a very elevated BUN:Cr ratio, clinically appears dehydrated, and sodium of 157 today.  Pt treated with IVF and her AKI resolved.  Protein-calorie malnutrition, severe See RD note from 01-26-2023 Severe Malnutrition related to chronic illness (moderate dementia) as evidenced by severe fat depletion, severe muscle depletion.   Hypomagnesemia Repleted with IV magnesium. 01-27-2023. Due to refeeding syndrome/diarrhea. Have ordered IV mag  Hypokalemia Due to poor po intake. Repleted with IV KCL.  01-27-2023. Due to refeeding syndrome/diarrhea. Have started scheduled kcl for potassium replacement.  DNR (do not resuscitate)/DNI(Do Not Intubate) Pt made DNR/DNI by Dr. Lowell [name] on 01-25-2023.  Acute deep vein thrombosis (DVT) of left lower extremity (HCC) Seen on LE U/S. Initially placed on IV heparin. Changed to SQ lovenox 90 mg SQ qday.  Moderate dementia (HCC) Dementia for years. at baseline, wheelchair bound (walked 80 ft with walker with assistance in early September at rehab - wheelchair bound since being home), she's disoriented typically, but will communicate.   On admission, pt had acute encephalopathy with initial GCS  7. At baseline, she is confused to time and location.   Hypertension Chart diagnosis, doesn't appear to be on any chronic meds for this.   DVT prophylaxis:   Lovenox   Code Status: Do not attempt resuscitation (DNR) - Comfort care Family Communication: no family at bedside Disposition Plan: SNF vs hospice Reason for continuing need for hospitalization: remains on trial of tube feeds via Cortrak/NG tube.  Objective: Vitals:   01/26/23 2358 01/27/23 0434 01/27/23 0442 01/27/23 0722  BP: (!) 122/48 (!) 117/44  (!) 133/59  Pulse: 100 98  (!) 109  Resp:  17  19  Temp: 99.3 F (37.4 C) 98.9 F (37.2 C)  98.8 F (37.1 C)  TempSrc: Axillary Oral  Oral  SpO2: 100% 99%  100%  Weight:   79.4 kg     Intake/Output Summary (Last 24 hours) at 01/27/2023 1021 Last data filed at 01/27/2023 0436 Gross per 24 hour  Intake 430 ml  Output 200 ml  Net 230 ml   Filed Weights   01/19/23 0315 01/27/23 0442  Weight: 58.1 kg 79.4 kg    Examination:  Physical Exam Vitals and nursing note reviewed.  Constitutional:      Comments: Awake but disoriented. Chronically ill appearing  HENT:     Head: Normocephalic and atraumatic.     Nose: Nose  normal.  Eyes:     General: No scleral icterus. Cardiovascular:     Rate and Rhythm: Normal rate and regular rhythm.  Pulmonary:     Effort: Pulmonary effort is normal. No respiratory distress.     Breath sounds: Normal breath sounds.  Abdominal:     General: Bowel sounds are normal. There is no distension.  Musculoskeletal:     Left lower leg: 1+ Edema present.     Left ankle: Swelling present.     Left foot: Swelling present.  Skin:    General: Skin is warm and dry.     Capillary Refill: Capillary refill takes less than 2 seconds.  Neurological:     Mental Status: She is disoriented.     Data Reviewed: I have personally reviewed following labs and imaging studies  CBC: Recent Labs  Lab 01/21/23 0440 01/22/23 1225 01/23/23 0840  01/24/23 1100 01/25/23 0353 01/26/23 0357  WBC 11.3* 10.8* 12.8* 10.7* 9.6 7.5  NEUTROABS 8.4* 7.8*  --  8.4* 7.5 5.3  HGB 10.9* 10.7* 11.2* 10.6* 10.4* 10.0*  HCT 36.2 35.5* 36.7 33.2* 32.8* 31.5*  MCV 87.2 83.9 83.4 82.8 82.6 83.3  PLT 185 194 252 240 240 267   Basic Metabolic Panel: Recent Labs  Lab 01/22/23 1225 01/23/23 0101 01/23/23 0840 01/23/23 1946 01/24/23 1100 01/25/23 0353 01/26/23 0357  NA 144 144  --  144 137 138 141  K 3.4* 3.7  --  3.4* 2.8* 3.2* 3.1*  CL 106 109  --  105 106 106 109  CO2 23 24  --  23 24 23 25   GLUCOSE 103* 92  --  113* 92 108* 90  BUN 8 6*  --  <5* <5* <5* <5*  CREATININE 0.64 0.62  --  0.57 0.45 0.47 0.55  CALCIUM 8.0* 8.1*  --  8.2* 7.6* 7.5* 7.7*  MG 1.5*  --  2.0  --  1.4* 2.2 1.7  PHOS 1.9*  --  1.9*  --  2.3* 2.0* 2.2*   GFR: Estimated Creatinine Clearance: 61.4 mL/min (by C-G formula based on SCr of 0.55 mg/dL). Liver Function Tests: Recent Labs  Lab 01/21/23 0440 01/22/23 1225 01/24/23 1100 01/25/23 0353 01/26/23 0357  AST 64* 38 35 33 71*  ALT 36 29 32 32 52*  ALKPHOS 55 51 55 58 65  BILITOT 0.7 0.2* 0.5 0.4 0.5  PROT 6.9 6.2* 5.9* 5.9* 5.9*  ALBUMIN 2.6* 2.2* 2.1* 2.1* 2.1*   Recent Labs  Lab 01/21/23 0440 01/22/23 1225 01/24/23 1100  LIPASE 196* 121* 49   CBG: Recent Labs  Lab 01/26/23 1549 01/26/23 1934 01/26/23 2336 01/27/23 0425 01/27/23 0810  GLUCAP 111* 134* 114* 137* 112*    Recent Results (from the past 240 hour(s))  Blood culture (routine x 2)     Status: None   Collection Time: 01/18/23  6:48 PM   Specimen: BLOOD RIGHT ARM  Result Value Ref Range Status   Specimen Description BLOOD RIGHT ARM  Final   Special Requests   Final    BOTTLES DRAWN AEROBIC AND ANAEROBIC Blood Culture adequate volume   Culture   Final    NO GROWTH 5 DAYS Performed at [facility], [address], [place], [place] [address]    Report Status 01/23/2023 FINAL  Final  Blood culture (routine x 2)      Status: None   Collection Time: 01/18/23  6:49 PM   Specimen: BLOOD RIGHT ARM  Result Value Ref Range  Status   Specimen Description BLOOD RIGHT ARM  Final   Special Requests   Final    BOTTLES DRAWN AEROBIC AND ANAEROBIC Blood Culture adequate volume   Culture   Final    NO GROWTH 5 DAYS Performed at [facility], [address] [place], [place] [address]    Report Status 01/23/2023 FINAL  Final  Urine Culture     Status: None   Collection Time: 01/18/23  7:07 PM   Specimen: Urine, Clean Catch  Result Value Ref Range Status   Specimen Description URINE, CLEAN CATCH  Final   Special Requests NONE  Final   Culture   Final    NO GROWTH Performed at [facility], [name], [place], [place] [address]    Report Status 01/20/2023 FINAL  Final  SARS Coronavirus 2 by RT PCR (hospital order, performed in [facility] hospital lab) *cepheid single result test* Anterior Nasal Swab     Status: None   Collection Time: 01/18/23 11:09 PM   Specimen: Anterior Nasal Swab  Result Value Ref Range Status   SARS Coronavirus 2 by RT PCR NEGATIVE NEGATIVE Final    Comment: Performed at [facility], [address] [place], [place] [address]  Respiratory (~20 pathogens) panel by PCR     Status: None   Collection Time: 01/18/23 11:09 PM   Specimen: Anterior Nasal Swab; Respiratory  Result Value Ref Range Status   Adenovirus NOT DETECTED NOT DETECTED Final   Coronavirus 229E NOT DETECTED NOT DETECTED Final    Comment: (NOTE) The Coronavirus on the Respiratory Panel, DOES NOT test for the novel  Coronavirus (2019 nCoV)    Coronavirus HKU1 NOT DETECTED NOT DETECTED Final   Coronavirus NL63 NOT DETECTED NOT DETECTED Final   Coronavirus OC43 NOT DETECTED NOT DETECTED Final   Metapneumovirus NOT DETECTED NOT DETECTED Final   Rhinovirus / Enterovirus NOT DETECTED NOT DETECTED Final   Influenza A NOT DETECTED NOT DETECTED Final   Influenza B NOT DETECTED NOT DETECTED Final    Parainfluenza Virus 1 NOT DETECTED NOT DETECTED Final   Parainfluenza Virus 2 NOT DETECTED NOT DETECTED Final   Parainfluenza Virus 3 NOT DETECTED NOT DETECTED Final   Parainfluenza Virus 4 NOT DETECTED NOT DETECTED Final   Respiratory Syncytial Virus NOT DETECTED NOT DETECTED Final   Bordetella pertussis NOT DETECTED NOT DETECTED Final   Bordetella Parapertussis NOT DETECTED NOT DETECTED Final   Chlamydophila pneumoniae NOT DETECTED NOT DETECTED Final   Mycoplasma pneumoniae NOT DETECTED NOT DETECTED Final    Comment: Performed at [facility], [address], [place], [place] [address]     Radiology Studies: DG Abd Portable 1V  Result Date: 01/26/2023 CLINICAL DATA:  Feeding tube placement. EXAM: PORTABLE ABDOMEN - 1 VIEW COMPARISON:  CT 01/18/2023 FINDINGS: Tip of the weighted enteric tube in the right upper quadrant of the abdomen in the region of the distal stomach or proximal duodenum. No small bowel dilatation in the included upper abdomen. IMPRESSION: Tip of the weighted enteric tube in the distal stomach or proximal duodenum. Electronically Signed   By: Narda Rutherford M.D.   On: 01/26/2023 10:34    Scheduled Meds:  enoxaparin (LOVENOX) injection  90 mg Subcutaneous Q24H   fiber supplement (BANATROL TF)  60 mL Per Tube BID   folic acid  1 mg Intravenous Daily   free water  70 mL Per Tube Q4H   potassium chloride  20 mEq  Per Tube BID   thiamine (VITAMIN B1) injection  100 mg Intravenous Daily   Continuous Infusions:  feeding supplement (OSMOLITE 1.2 CAL) 1,000 mL (01/26/23 1634)   magnesium sulfate bolus IVPB       LOS: 9 days   Time spent: 40 minutes  Carollee Herter, DO  [facility]  01/27/2023, 10:21 AM

## 2023-01-27 NOTE — Assessment & Plan Note (Addendum)
Pt with diarrhea after tube feeds started. RD managing pt's diarrhea with slower TF rate and adding fiber to feeds. Will keep electrolytes balance with liquid Kcl via tube and IV magnesium.  02-01-2023. Cortrak to be removed. Serum prealbumin is very low indicative of several months of poor nutritional status. Her refeeding syndrome/diarrhea will resolve once tube feeds are turned off. I expect it to resolve in next 24-48 hours.

## 2023-01-27 NOTE — Plan of Care (Signed)
  Problem: Metabolic: Goal: Ability to maintain appropriate glucose levels will improve Outcome: Progressing   Problem: Nutritional: Goal: Maintenance of adequate nutrition will improve Outcome: Progressing  Patient at goal with osmolite at 50 ml/hr. Still having poor oral intake.  Problem: Skin Integrity: Goal: Risk for impaired skin integrity will decrease Outcome: Progressing   Problem: Tissue Perfusion: Goal: Adequacy of tissue perfusion will improve Outcome: Progressing   Problem: Clinical Measurements: Goal: Ability to maintain clinical measurements within normal limits will improve Outcome: Progressing Goal: Will remain free from infection Outcome: Progressing   Problem: Nutrition: Goal: Adequate nutrition will be maintained Outcome: Progressing  Patient still has poor oral intake. Osmolite goal reached at 50 ml/hr.  Problem: Elimination: Goal: Will not experience complications related to bowel motility Outcome: Progressing   Problem: Safety: Goal: Ability to remain free from injury will improve Outcome: Progressing   Problem: Fluid Volume: Goal: Ability to maintain a balanced intake and output will improve Outcome: Not Progressing   Problem: Health Behavior/Discharge Planning: Goal: Ability to manage health-related needs will improve Outcome: Not Progressing   Problem: Activity: Goal: Risk for activity intolerance will decrease Outcome: Not Progressing

## 2023-01-28 DIAGNOSIS — E43 Unspecified severe protein-calorie malnutrition: Secondary | ICD-10-CM

## 2023-01-28 DIAGNOSIS — E878 Other disorders of electrolyte and fluid balance, not elsewhere classified: Secondary | ICD-10-CM | POA: Diagnosis not present

## 2023-01-28 DIAGNOSIS — I82412 Acute embolism and thrombosis of left femoral vein: Secondary | ICD-10-CM | POA: Diagnosis not present

## 2023-01-28 DIAGNOSIS — Z515 Encounter for palliative care: Secondary | ICD-10-CM | POA: Diagnosis not present

## 2023-01-28 DIAGNOSIS — E870 Hyperosmolality and hypernatremia: Secondary | ICD-10-CM | POA: Diagnosis not present

## 2023-01-28 DIAGNOSIS — G9341 Metabolic encephalopathy: Secondary | ICD-10-CM | POA: Diagnosis not present

## 2023-01-28 LAB — COMPREHENSIVE METABOLIC PANEL
ALT: 50 U/L — ABNORMAL HIGH (ref 0–44)
AST: 41 U/L (ref 15–41)
Albumin: 2.1 g/dL — ABNORMAL LOW (ref 3.5–5.0)
Alkaline Phosphatase: 76 U/L (ref 38–126)
Anion gap: 8 (ref 5–15)
BUN: 5 mg/dL — ABNORMAL LOW (ref 8–23)
CO2: 25 mmol/L (ref 22–32)
Calcium: 7.8 mg/dL — ABNORMAL LOW (ref 8.9–10.3)
Chloride: 107 mmol/L (ref 98–111)
Creatinine, Ser: 0.6 mg/dL (ref 0.44–1.00)
GFR, Estimated: >60 mL/min (ref >60)
Glucose, Bld: 120 mg/dL — ABNORMAL HIGH (ref 70–99)
Potassium: 3.8 mmol/L (ref 3.5–5.1)
Sodium: 140 mmol/L (ref 135–145)
Total Bilirubin: 0.4 mg/dL (ref 0.3–1.2)
Total Protein: 6 g/dL — ABNORMAL LOW (ref 6.5–8.1)

## 2023-01-28 LAB — GLUCOSE, CAPILLARY
Glucose-Capillary: 103 mg/dL — ABNORMAL HIGH (ref 70–99)
Glucose-Capillary: 104 mg/dL — ABNORMAL HIGH (ref 70–99)
Glucose-Capillary: 110 mg/dL — ABNORMAL HIGH (ref 70–99)
Glucose-Capillary: 111 mg/dL — ABNORMAL HIGH (ref 70–99)
Glucose-Capillary: 116 mg/dL — ABNORMAL HIGH (ref 70–99)
Glucose-Capillary: 124 mg/dL — ABNORMAL HIGH (ref 70–99)
Glucose-Capillary: 66 mg/dL — ABNORMAL LOW (ref 70–99)

## 2023-01-28 LAB — CBC
HCT: 31 % — ABNORMAL LOW (ref 36.0–46.0)
Hemoglobin: 9.6 g/dL — ABNORMAL LOW (ref 12.0–15.0)
MCH: 25.3 pg — ABNORMAL LOW (ref 26.0–34.0)
MCHC: 31 g/dL (ref 30.0–36.0)
MCV: 81.8 fL (ref 80.0–100.0)
Platelets: 283 10*3/uL (ref 150–400)
RBC: 3.79 MIL/uL — ABNORMAL LOW (ref 3.87–5.11)
RDW: 15.7 % — ABNORMAL HIGH (ref 11.5–15.5)
WBC: 10.7 10*3/uL — ABNORMAL HIGH (ref 4.0–10.5)
nRBC: 0 % (ref 0.0–0.2)

## 2023-01-28 LAB — MAGNESIUM: Magnesium: 2 mg/dL (ref 1.7–2.4)

## 2023-01-28 LAB — PHOSPHORUS: Phosphorus: 2 mg/dL — ABNORMAL LOW (ref 2.5–4.6)

## 2023-01-28 MED ORDER — MORPHINE SULFATE (PF) 2 MG/ML IV SOLN
1.0000 mg | INTRAVENOUS | Status: DC | PRN
  Administered 2023-01-29 – 2023-01-30 (×3): 1 mg via INTRAVENOUS
  Filled 2023-01-28 (×3): qty 1

## 2023-01-28 MED ORDER — HYDROCODONE-ACETAMINOPHEN 7.5-325 MG/15ML PO SOLN
10.0000 mL | ORAL | Status: DC | PRN
  Administered 2023-01-28 – 2023-01-29 (×2): 10 mL
  Filled 2023-01-28 (×2): qty 15

## 2023-01-28 MED ORDER — ACETAMINOPHEN 160 MG/5ML PO SOLN: 650.0000 mg | Freq: Four times a day (QID) | ORAL | Status: DC | PRN

## 2023-01-28 NOTE — Progress Notes (Signed)
PROGRESS NOTE    Amanda [name]  WUJ:[id number] DOB: [birth year] DOA: 01/18/2023 PCP: [name], MD  Subjective: Pt seen and examined. No family at bedside. Remains with Cortrak Required IV morphine x 1 dose last night for pain Has added prn IV morphine for severe pain and liquid hydrocodone for moderate pain via NG tube.  Still no family at bedside. Have not seen any family members in 3 days.   Hospital Course: HPI: Amanda [name] is a 77 y.o. female with medical history significant of moderate dementia.   Pt with 24h of progressively worsening AMS, now poorly responsive with GCS 7   BGL okay   H/o PNA and UTI causing sepsis back in June.   Family reports decreased PO intake recently "can't get her to eat anything".  They wonder about dental abscess.   Also has known decubitus ulcer.  Significant Events: Admitted 01/18/2023 for acute metabolic encephalopathy, hypernatremia and AKI 01-26-2023. Cortrak placed and tube feeding started 01-27-2023 refeeding syndrome/diarrhea started.  Significant Labs: Admitting WBC of 17K, Na 149, Scr 2.36  Significant Imaging Studies: Admitting MRI brain negative for acute CVA  Antibiotic Therapy: Anti-infectives (From admission, onward)    Start     Dose/Rate Route Frequency Ordered Stop   01/19/23 1900  ceFEPIme (MAXIPIME) 2 g in sodium chloride 0.9 % 100 mL IVPB  Status:  Discontinued        2 g 200 mL/hr over 30 Minutes Intravenous Every 24 hours 01/19/23 0338 01/19/23 0945   01/19/23 1000  cefTRIAXone (ROCEPHIN) 2 g in sodium chloride 0.9 % 100 mL IVPB       Note to Pharmacy: Retime as indicated   2 g 200 mL/hr over 30 Minutes Intravenous Every 24 hours 01/19/23 0945 01/25/23 1325   01/19/23 0342  vancomycin variable dose per unstable renal function (pharmacist dosing)  Status:  Discontinued         Does not apply See admin instructions 01/19/23 0343 01/20/23 0718   01/18/23 1830  vancomycin (VANCOREADY) IVPB 1250  mg/250 mL        1,250 mg 166.7 mL/hr over 90 Minutes Intravenous  Once 01/18/23 1818 01/18/23 2122   01/18/23 1815  ceFEPIme (MAXIPIME) 2 g in sodium chloride 0.9 % 100 mL IVPB        2 g 200 mL/hr over 30 Minutes Intravenous  Once 01/18/23 1807 01/18/23 1911       Procedures: 01-21-2023 EEG with moderate to severe diffuse encephalopathy, no seizures or epileptiform discharges Cortrak placed 01-26-2023  Consultants: Palliative Care    Assessment and Plan: * Acute metabolic encephalopathy On admission, pt's etiology of her acute metabolic encephalopathy was/is multifactorial. Admission Sodium 157 and BUN 138 definitely contributing / causing, some or all of this. Likely multifactorial (hypernatremia, uremia/AKI, dementia) but inciting cause unclear.  No clear infectious cause (CXR without active disease, CT maxillofacial without notable findings, CT abd/pelvis without acute findings, decub doesn't appear infected) TSH wnl, ammonia wnl  B12 (elevated), folate low -> supplement EEG with moderate to severe diffuse encephalopathy, no seizures or epileptiform discharges CT head without acute abnormality MRI brain without acute abnormality   Pt's mental status has not improved since admission. This is likely progressive decline of her already established dementia.  Pt's hypernatremia and AKI resolved with IVF  Pt's mentation has not significantly improved.  Palliative care consulted and they discussed with pt's husband on 01-25-2023. Limited Cortrak feedings will start today. Cortrak placed on 01-26-2023.  01-28-2023. Pt  has been on tube feedings since 01-26-2023. No appreciable change in her mentation with extra nutrition provided by NG feeds. Having diarrhea from refeeding syndrome.  Refeeding syndrome Pt with diarrhea after tube feeds started. RD managing pt's diarrhea with slower TF rate and adding fiber to feeds. Will keep electrolytes balance with liquid Kcl via tube and IV  magnesium.  SIRS (systemic inflammatory response syndrome) (HCC) Initial tachycardia, WBC 17k.  Pt had SIRS due to AKI and dehydration. Sepsis ruled out.  Procal was elevated at 0.32. pt given IV Rocephin for 7 days. But pt never had a clear source of infection.  All urine and blood cx were negative. RVP also negative.   Hypernatremia Severe dehydration due to decreased PO intake. Treated with 1.5L LR bolus in ED for question of sepsis. Sepsis ruled out.  Pt then given D5W at 75 cc/hr for hypernatremia. Pt's hypernatremia resolved after IVF.  Receiving free water via NG/cortrak.  Dysphagia Pt still not able to take po safely. Palliative care discussed with husband. Limited trial of cortrak feedings starting 01-26-2023.  Decubitus ulcer Wound care consult on pt 01-19-2023 No obvious erythema, drainage, crepitus. Location: Sacral Pressure Injury POA: Yes.  Measurement:7cm x 7cm x 0.1cm (this included the area of deep tissue injury) with the partial thickness scattered skin loss  AKI (acute kidney injury) (HCC) On admission, AKI Very likely pre-renal / ATN with a very elevated BUN:Cr ratio, clinically appears dehydrated, and sodium of 157 today.  Pt treated with IVF and her AKI resolved.  Protein-calorie malnutrition, severe See RD note from 01-26-2023 Severe Malnutrition related to chronic illness (moderate dementia) as evidenced by severe fat depletion, severe muscle depletion.   Hypomagnesemia Repleted with IV magnesium. 01-27-2023. Due to refeeding syndrome/diarrhea. Have ordered IV mag  Hypokalemia Due to poor po intake. Repleted with IV KCL.  01-27-2023. Due to refeeding syndrome/diarrhea. Have started scheduled kcl for potassium replacement.  DNR (do not resuscitate)/DNI(Do Not Intubate) Pt made DNR/DNI by Dr. Lowell Guitar on 01-25-2023.  Acute deep vein thrombosis (DVT) of left lower extremity (HCC) Seen on LE U/S. Initially placed on IV heparin. Changed to SQ lovenox 90 mg SQ  qday.  Moderate dementia (HCC) Dementia for years. at baseline, wheelchair bound (walked 80 ft with walker with assistance in early September at rehab - wheelchair bound since being home), she's disoriented typically, but will communicate.   On admission, pt had acute encephalopathy with initial GCS 7. At baseline, she is confused to time and location.   Hypertension Chart diagnosis, doesn't appear to be on any chronic meds for this.       DVT prophylaxis:   Lovenox   Code Status: Do not attempt resuscitation (DNR) - Comfort care Family Communication: no family members at bedside. Have not seen any family members for 3 days. Disposition Plan: SNF vs hospice vs home Reason for continuing need for hospitalization: continues on NG/Cortrak tube feedings.  Objective: Vitals:   01/27/23 2023 01/27/23 2357 01/28/23 0313 01/28/23 0753  BP: (!) 154/102 (!) 142/129 118/64 132/67  Pulse: (!) 108 100 98   Resp: 16 16 17    Temp: 98.1 F (36.7 C) 98.6 F (37 C) 98.7 F (37.1 C) 98.8 F (37.1 C)  TempSrc: Oral Oral Oral Oral  SpO2: 100% 100% 100%   Weight:        Intake/Output Summary (Last 24 hours) at 01/28/2023 1135 Last data filed at 01/28/2023 0255 Gross per 24 hour  Intake 170 ml  Output --  Net  170 ml   Filed Weights   01/19/23 0315 01/27/23 0442  Weight: 58.1 kg 79.4 kg    Examination:  Physical Exam Vitals and nursing note reviewed.  Constitutional:      Comments: Chronically ill appearing No awake this AM.  HENT:     Head: Normocephalic and atraumatic.     Nose: Nose normal.  Eyes:     General: No scleral icterus. Cardiovascular:     Rate and Rhythm: Normal rate and regular rhythm.  Pulmonary:     Effort: Pulmonary effort is normal. No respiratory distress.     Breath sounds: Normal breath sounds.  Abdominal:     General: Bowel sounds are normal. There is no distension.  Musculoskeletal:     Left lower leg: 1+ Edema present.     Left ankle: Swelling  present.     Left foot: Swelling present.     Comments: Remains with bilateral hand mittens to prevent pt pulling out Cortrak tube.  Skin:    General: Skin is warm and dry.     Capillary Refill: Capillary refill takes less than 2 seconds.  Neurological:     Comments: Asleep this AM.     Data Reviewed: I have personally reviewed following labs and imaging studies  CBC: Recent Labs  Lab 01/22/23 1225 01/23/23 0840 01/24/23 1100 01/25/23 0353 01/26/23 0357 01/27/23 1217 01/28/23 0554  WBC 10.8*   < > 10.7* 9.6 7.5 11.4* 10.7*  NEUTROABS 7.8*  --  8.4* 7.5 5.3  --   --   HGB 10.7*   < > 10.6* 10.4* 10.0* 9.8* 9.6*  HCT 35.5*   < > 33.2* 32.8* 31.5* 30.8* 31.0*  MCV 83.9   < > 82.8 82.6 83.3 82.6 81.8  PLT 194   < > 240 240 267 258 283   < > = values in this interval not displayed.   Basic Metabolic Panel: Recent Labs  Lab 01/24/23 1100 01/25/23 0353 01/26/23 0357 01/27/23 0947 01/27/23 1217 01/28/23 0554 01/28/23 0917  NA 137 138 141  --  140 140  --   K 2.8* 3.2* 3.1*  --  3.1* 3.8  --   CL 106 106 109  --  106 107  --   CO2 24 23 25   --  24 25  --   GLUCOSE 92 108* 90  --  114* 120*  --   BUN <5* <5* <5*  --  <5* 5*  --   CREATININE 0.45 0.47 0.55  --  0.58 0.60  --   CALCIUM 7.6* 7.5* 7.7*  --  7.7* 7.8*  --   MG 1.4* 2.2 1.7 1.4*  --  2.0  --   PHOS 2.3* 2.0* 2.2* 2.3*  --   --  2.0*   GFR: Estimated Creatinine Clearance: 61.4 mL/min (by C-G formula based on SCr of 0.6 mg/dL). Liver Function Tests: Recent Labs  Lab 01/24/23 1100 01/25/23 0353 01/26/23 0357 01/27/23 1217 01/28/23 0554  AST 35 33 71* 49* 41  ALT 32 32 52* 52* 50*  ALKPHOS 55 58 65 69 76  BILITOT 0.5 0.4 0.5 0.4 0.4  PROT 5.9* 5.9* 5.9* 5.9* 6.0*  ALBUMIN 2.1* 2.1* 2.1* 2.1* 2.1*   Recent Labs  Lab 01/22/23 1225 01/24/23 1100  LIPASE 121* 49   CBG: Recent Labs  Lab 01/27/23 1613 01/27/23 2027 01/28/23 0001 01/28/23 0430 01/28/23 0756  GLUCAP 128* 88 111* 110* 104*    Recent Results (from the past 240  hour(s))  Blood culture (routine x 2)     Status: None   Collection Time: 01/18/23  6:48 PM   Specimen: BLOOD RIGHT ARM  Result Value Ref Range Status   Specimen Description BLOOD RIGHT ARM  Final   Special Requests   Final    BOTTLES DRAWN AEROBIC AND ANAEROBIC Blood Culture adequate volume   Culture   Final    NO GROWTH 5 DAYS Performed at [facility], [address], [place], [place] [address]    Report Status 01/23/2023 FINAL  Final  Blood culture (routine x 2)     Status: None   Collection Time: 01/18/23  6:49 PM   Specimen: BLOOD RIGHT ARM  Result Value Ref Range Status   Specimen Description BLOOD RIGHT ARM  Final   Special Requests   Final    BOTTLES DRAWN AEROBIC AND ANAEROBIC Blood Culture adequate volume   Culture   Final    NO GROWTH 5 DAYS Performed at [facility], [address] [place], [place] [address]    Report Status 01/23/2023 FINAL  Final  Urine Culture     Status: None   Collection Time: 01/18/23  7:07 PM   Specimen: Urine, Clean Catch  Result Value Ref Range Status   Specimen Description URINE, CLEAN CATCH  Final   Special Requests NONE  Final   Culture   Final    NO GROWTH Performed at [facility], [address] [place], [place] [address]    Report Status 01/20/2023 FINAL  Final  SARS Coronavirus 2 by RT PCR (hospital order, performed in [facility] hospital lab) *cepheid single result test* Anterior Nasal Swab     Status: None   Collection Time: 01/18/23 11:09 PM   Specimen: Anterior Nasal Swab  Result Value Ref Range Status   SARS Coronavirus 2 by RT PCR NEGATIVE NEGATIVE Final    Comment: Performed at [facility], [address], [place], [place] [address]  Respiratory (~20 pathogens) panel by PCR     Status: None   Collection Time: 01/18/23 11:09 PM   Specimen: Anterior Nasal Swab; Respiratory  Result Value Ref Range Status   Adenovirus NOT DETECTED NOT DETECTED Final    Coronavirus 229E NOT DETECTED NOT DETECTED Final    Comment: (NOTE) The Coronavirus on the Respiratory Panel, DOES NOT test for the novel  Coronavirus (2019 nCoV)    Coronavirus HKU1 NOT DETECTED NOT DETECTED Final   Coronavirus NL63 NOT DETECTED NOT DETECTED Final   Coronavirus OC43 NOT DETECTED NOT DETECTED Final   Metapneumovirus NOT DETECTED NOT DETECTED Final   Rhinovirus / Enterovirus NOT DETECTED NOT DETECTED Final   Influenza A NOT DETECTED NOT DETECTED Final   Influenza B NOT DETECTED NOT DETECTED Final   Parainfluenza Virus 1 NOT DETECTED NOT DETECTED Final   Parainfluenza Virus 2 NOT DETECTED NOT DETECTED Final   Parainfluenza Virus 3 NOT DETECTED NOT DETECTED Final   Parainfluenza Virus 4 NOT DETECTED NOT DETECTED Final   Respiratory Syncytial Virus NOT DETECTED NOT DETECTED Final   Bordetella pertussis NOT DETECTED NOT DETECTED Final   Bordetella Parapertussis NOT DETECTED NOT DETECTED Final   Chlamydophila pneumoniae NOT DETECTED NOT DETECTED Final   Mycoplasma pneumoniae NOT DETECTED NOT DETECTED Final    Comment: Performed at [facility], [address] [place], [place] [address]     Radiology Studies: No results found.  Scheduled Meds:  enoxaparin (LOVENOX) injection  90 mg Subcutaneous Q24H   fiber supplement (BANATROL TF)  60 mL Per Tube BID   folic acid  1 mg Intravenous Daily   free water  100 mL Per Tube Q4H   potassium chloride  20 mEq Per Tube BID   thiamine (VITAMIN B1) injection  100 mg Intravenous Daily   Continuous Infusions:  feeding supplement (OSMOLITE 1.2 CAL) 1,000 mL (01/26/23 1634)     LOS: 10 days   Time spent: 30 minutes  Carollee Herter, DO  [facility]  01/28/2023, 11:35 AM

## 2023-01-28 NOTE — Progress Notes (Signed)
   Met with the patient's daughter [name] and the patient's husband [name].  She lives in [place].  Reviewed for them the patient's hospital course.  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  Reviewed that the patient started having diarrhea almost immediately after tube feedings were started.  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  Husband insists that the patient was eating normally about 2 weeks ago.  Daughter then stated that it has been since September 7th since the patient ate well as far as her knowledge. This would place the pt's poor nutritional status at 4 weeks.  However, I Reviewed the fact that her albumin dropped precipitously in June 2024 when it was normal in May 2024.  Reviewed for them that likely the patient has been not eating well for the last 4 months due to a drop in her serum albumin.  Reviewed for them that they are memory of how poor she has been eating is not reflected based on her lab values.  And that their recollection of patient's nutritional status is probably wrong.  Discussed with them that long-term NG tube feeding is not an option.  Discussed that hypoglycemia has been ruled out as a cause of the patient's mental status change.  Told them that we would not continue NG tube feeds to evaluate if this would help her mentation for more than 10 days.  I did mention to them that I thought 3 days of NG tube feedings was not enough to make a solid recommendation if they were working or not.  I told them that it would be better if we monitor the patient for total of 5 to 7 days of NG tube feedings to see if this helps her mentation.  I did mention to them that I did not think that nutrition is playing a role in her poor mental status but likely this is a result of dementia.  I asked them to consider how much time they wanted her to be on NG tube feedings.  Again I told them that NG tube feedings would not  be continued past 10 days but that 5 to 7 days of observation on tube feeds would be a reasonable option.    Did discuss the risks of continued NG tube feeding including nasal septal perforation and/or ulceration.  Also discussed the fact that patient could aspirate with tube feedings.  While I was discussing this actual detail about NG tube feedings, the patient was able to wrap her wrist around the core track tube.  She was started to pull on the tube.  I was able to uncoil the tube from around her wrist to prevent the tube from being pulled out.  I illustrated to the family that this is the reason why NG tube feeds are so dangerous in the hospital and that the patient could pull out the tube while the feeding is still running and could send tube feeds down her lung.  At the end the conversation, all their questions were answered to the best of my ability.  Carollee Herter, DO [facility]

## 2023-01-28 NOTE — Progress Notes (Signed)
Daily Progress Note   Patient Name: Amanda [name]       Date: 01/28/2023 DOB: 1946-03-04  Age: 77 y.o. MRN#: [id number] Attending Physician: Carollee Herter, DO Primary Care Physician: [name], MD Admit Date: 01/18/2023  Reason for Consultation/Follow-up: Establishing goals of care  Subjective: Patient laying in bed looks uncomfortable as she moves in bed and moans. No family at bedside.  Length of Stay: 10  Current Medications: Scheduled Meds:   enoxaparin (LOVENOX) injection  90 mg Subcutaneous Q24H   fiber supplement (BANATROL TF)  60 mL Per Tube BID   folic acid  1 mg Intravenous Daily   free water  100 mL Per Tube Q4H   potassium chloride  20 mEq Per Tube BID   thiamine (VITAMIN B1) injection  100 mg Intravenous Daily    Continuous Infusions:  feeding supplement (OSMOLITE 1.2 CAL) 1,000 mL (01/26/23 1634)    PRN Meds: acetaminophen (TYLENOL) oral liquid 160 mg/5 mL, HYDROcodone-acetaminophen, morphine injection, ondansetron **OR** ondansetron (ZOFRAN) IV, mouth rinse  Physical Exam Vitals reviewed.  Constitutional:      General: She is sleeping.     Appearance: She is ill-appearing.     Comments: Cortrak and mitts  HENT:     Head: Normocephalic and atraumatic.  Cardiovascular:     Rate and Rhythm: Normal rate.  Pulmonary:     Effort: Pulmonary effort is normal.  Skin:    General: Skin is warm and dry.  Neurological:     Mental Status: She is easily aroused.             Vital Signs: BP (!) 142/62 (BP Location: Left Arm)   Pulse 98   Temp 98.8 F (37.1 C) (Oral)   Resp 17   Wt 79.4 kg   SpO2 100%   BMI 29.12 kg/m  SpO2: SpO2: 100 % O2 Device: O2 Device: Room Air O2 Flow Rate: O2 Flow Rate (L/min): 0 L/min  Intake/output summary:   Intake/Output Summary (Last 24 hours) at 01/28/2023 1233 Last data filed at 01/28/2023 0255 Gross per 24 hour  Intake 170 ml  Output --  Net 170 ml   LBM: Last BM Date : 01/28/23 Baseline Weight: Weight: 58.1 kg Most recent weight: Weight: 79.4 kg    Patient Active Problem List   Diagnosis Date Noted  Refeeding syndrome 01/27/2023   Acute deep vein thrombosis (DVT) of left lower extremity (HCC) 01/26/2023   DNR (do not resuscitate)/DNI(Do Not Intubate) 01/26/2023   Dysphagia 01/26/2023   Hypokalemia 01/26/2023   Hypomagnesemia 01/26/2023   Protein-calorie malnutrition, severe 01/26/2023   Hypernatremia 01/18/2023   SIRS (systemic inflammatory response syndrome) (HCC) 01/18/2023   Decubitus ulcer 01/18/2023   Acute metabolic encephalopathy 10/09/2022   AKI (acute kidney injury) (HCC) 10/08/2022   Moderate dementia (HCC) 10/02/2014   Hyperlipidemia with target low density lipoprotein (LDL) cholesterol less than 130 mg/dL 10/10/7626   Hypertension 06/12/2012    Palliative Care Assessment & Plan   Patient Profile: 77 y.o. female  with past medical history of moderate dementia admitted on 01/18/2023 with AMS and poor PO intake. She was found to have AKI and hypernatremia.   Today's discussion: Reached out to husband and daughter. We discussed patient's refeeding syndrome. Discussed her multiple episodes of diarrhea yesterday. Told them rectal tube was placed yesterday. Discussed her pressure ulcer and how we want to keep it clean and dry to facilitate healing. Yesterday patient also required tylenol and morphine for pain. Daughter questioned patient's pain as the family has not noticed the patient uncomfortable or in pain. I shared with her the pain documentation from nursing.  Goal is to continue to treat the treatable. Daughter shares that they plan to trial the cortrak for a week. She asks about decreasing the rate of the feeds. We discussed the caloric requirement for the  patient. The patient has eaten a few bites of food when family is there to feed her. We discussed that although this is an improvement, this is no where near her needed calorie intake. I encouraged them to consider stopping the trial earlier than the week if she continues to having refeeding and is uncomfortable.  Discussed the importance of continued conversation with family and the medical providers regarding overall plan of care and treatment options, ensuring decisions are within the context of the patient's values and GOCs. I emphasized the importance of considering her quality of life, overall suffering, what would be acceptable to her in the future.  Questions and concerns were addressed. The family was encouraged to call with questions or concerns. PMT will continue to support holistically.  Recommendations/Plan: DNR/DNI Limited scope of treatment Trial of cortrak-- family wants one week trial- encouraged them to be ready to pivot if the feeds continue to cause refeeding and discomfort Time for outcomes Continued PMT support   Code Status:    Code Status Orders  (From admission, onward)           Start     Ordered   01/28/23 1226  Do not attempt resuscitation (DNR)- Limited -Do Not Intubate (DNI)  (Code Status)  Continuous       Question Answer Comment  If pulseless and not breathing No CPR or chest compressions.   In Pre-Arrest Conditions (Patient Is Breathing and Has A Pulse) Do not intubate. Provide all appropriate non-invasive medical interventions. Avoid ICU transfer unless indicated or required.   Consent: Discussion documented in EHR or advanced directives reviewed      01/28/23 1226           Extensive chart review has been completed prior to seeing the patient and speaking with her husband and daughter by phone including labs, vital signs, imagine, progress/consult notes, orders, medications, and available advance directive documents.  Care plan was discussed  with bedside RN and Dr. [name]  Time  spent: 65 minutes  Thank you for allowing the Palliative Medicine Team to assist in the care of this patient.   Sherryll Burger, NP  Please contact Palliative Medicine Team phone at [phone] for questions and concerns.

## 2023-01-28 NOTE — Plan of Care (Signed)
  Problem: Nutritional: Goal: Maintenance of adequate nutrition will improve Outcome: Progressing   Problem: Skin Integrity: Goal: Risk for impaired skin integrity will decrease Outcome: Progressing   Problem: Coping: Goal: Level of anxiety will decrease Outcome: Progressing   Problem: Pain Managment: Goal: General experience of comfort will improve Outcome: Progressing   Problem: Safety: Goal: Ability to remain free from injury will improve Outcome: Progressing   Problem: Skin Integrity: Goal: Risk for impaired skin integrity will decrease Outcome: Progressing

## 2023-01-29 DIAGNOSIS — Z515 Encounter for palliative care: Secondary | ICD-10-CM | POA: Diagnosis not present

## 2023-01-29 DIAGNOSIS — Z7189 Other specified counseling: Secondary | ICD-10-CM | POA: Diagnosis not present

## 2023-01-29 DIAGNOSIS — G9341 Metabolic encephalopathy: Secondary | ICD-10-CM | POA: Diagnosis not present

## 2023-01-29 DIAGNOSIS — E878 Other disorders of electrolyte and fluid balance, not elsewhere classified: Secondary | ICD-10-CM | POA: Diagnosis not present

## 2023-01-29 DIAGNOSIS — E870 Hyperosmolality and hypernatremia: Secondary | ICD-10-CM | POA: Diagnosis not present

## 2023-01-29 DIAGNOSIS — L89150 Pressure ulcer of sacral region, unstageable: Secondary | ICD-10-CM | POA: Diagnosis not present

## 2023-01-29 LAB — GLUCOSE, CAPILLARY
Glucose-Capillary: 117 mg/dL — ABNORMAL HIGH (ref 70–99)
Glucose-Capillary: 126 mg/dL — ABNORMAL HIGH (ref 70–99)
Glucose-Capillary: 111 mg/dL — ABNORMAL HIGH (ref 70–99)
Glucose-Capillary: 141 mg/dL — ABNORMAL HIGH (ref 70–99)
Glucose-Capillary: 127 mg/dL — ABNORMAL HIGH (ref 70–99)

## 2023-01-29 LAB — COMPREHENSIVE METABOLIC PANEL
ALT: 45 U/L — ABNORMAL HIGH (ref 0–44)
AST: 32 U/L (ref 15–41)
Albumin: 2.3 g/dL — ABNORMAL LOW (ref 3.5–5.0)
Alkaline Phosphatase: 73 U/L (ref 38–126)
Anion gap: 9 (ref 5–15)
BUN: 5 mg/dL — ABNORMAL LOW (ref 8–23)
CO2: 26 mmol/L (ref 22–32)
Calcium: 8.2 mg/dL — ABNORMAL LOW (ref 8.9–10.3)
Chloride: 103 mmol/L (ref 98–111)
Creatinine, Ser: 0.51 mg/dL (ref 0.44–1.00)
GFR, Estimated: >60 mL/min (ref >60)
Glucose, Bld: 112 mg/dL — ABNORMAL HIGH (ref 70–99)
Potassium: 4.5 mmol/L (ref 3.5–5.1)
Sodium: 138 mmol/L (ref 135–145)
Total Bilirubin: 0.3 mg/dL (ref 0.3–1.2)
Total Protein: 6.8 g/dL (ref 6.5–8.1)

## 2023-01-29 LAB — CBC
HCT: 30.5 % — ABNORMAL LOW (ref 36.0–46.0)
Hemoglobin: 9.8 g/dL — ABNORMAL LOW (ref 12.0–15.0)
MCH: 26.9 pg (ref 26.0–34.0)
MCHC: 32.1 g/dL (ref 30.0–36.0)
MCV: 83.8 fL (ref 80.0–100.0)
Platelets: 287 10*3/uL (ref 150–400)
RBC: 3.64 MIL/uL — ABNORMAL LOW (ref 3.87–5.11)
RDW: 15.7 % — ABNORMAL HIGH (ref 11.5–15.5)
WBC: 11.2 10*3/uL — ABNORMAL HIGH (ref 4.0–10.5)
nRBC: 0 % (ref 0.0–0.2)

## 2023-01-29 NOTE — Progress Notes (Signed)
Daily Progress Note   Patient Name: Amanda [name]       Date: 01/29/2023 DOB: [birth year]  Age: 77 y.o. MRN#: [id number] Attending Physician: Carollee Herter, DO Primary Care Physician: [name], MD Admit Date: 01/18/2023  Reason for Consultation/Follow-up: Establishing goals of care  Subjective: Patient laying in bed she appears a little agitated. She has partially removed her gown. She is moaning some as she moves in bed. No family at bedside.  Length of Stay: 11  Current Medications: Scheduled Meds:   enoxaparin (LOVENOX) injection  90 mg Subcutaneous Q24H   fiber supplement (BANATROL TF)  60 mL Per Tube BID   folic acid  1 mg Intravenous Daily   free water  100 mL Per Tube Q4H   potassium chloride  20 mEq Per Tube BID   thiamine (VITAMIN B1) injection  100 mg Intravenous Daily    Continuous Infusions:  feeding supplement (OSMOLITE 1.2 CAL) 1,000 mL (01/26/23 1634)    PRN Meds: acetaminophen (TYLENOL) oral liquid 160 mg/5 mL, HYDROcodone-acetaminophen, morphine injection, ondansetron **OR** ondansetron (ZOFRAN) IV, mouth rinse  Physical Exam Vitals reviewed.  Constitutional:      General: She is sleeping.     Appearance: She is ill-appearing.     Comments: Cortrak and mitts  HENT:     Head: Normocephalic and atraumatic.  Cardiovascular:     Rate and Rhythm: Normal rate.  Pulmonary:     Effort: Pulmonary effort is normal.  Skin:    General: Skin is warm and dry.  Neurological:     Mental Status: She is easily aroused. She is disoriented.  Psychiatric:        Behavior: Behavior is agitated.             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   Acute deep vein thrombosis (DVT) of left lower extremity (HCC) 01/26/2023   DNR (do not resuscitate)/DNI(Do Not Intubate) 01/26/2023   Dysphagia 01/26/2023   Hypokalemia 01/26/2023   Hypomagnesemia 01/26/2023   Protein-calorie malnutrition, severe 01/26/2023   Hypernatremia 01/18/2023   SIRS (systemic inflammatory response syndrome) (HCC) 01/18/2023   Decubitus ulcer 01/18/2023   Acute metabolic encephalopathy 10/09/2022   AKI (acute kidney injury) (HCC) 10/08/2022   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 female  with past medical history of moderate dementia admitted on 01/18/2023 with AMS and poor PO intake. She was found to have AKI and hypernatremia.   Today's discussion:  1315: Called patient's daughter [name] to update her on my visit to see her mother this morning. Sheree feels much better after speaking with Dr. [name] yesterday. She has a better understanding of her mothers altered mental status, the cortrak, and refeeding syndrome. Her mother only ate a few bites of food with family assistance yesterday. She continues to have diarrhea. She shares that she hopes her mother will have some improvement with her mental status with the tube feeds.   Discussed the importance of continued conversation with family and the medical providers regarding overall plan of care and treatment options, ensuring decisions are within the context of the  patient's values and GOCs.   Questions and concerns were addressed. The family was encouraged to call with questions or concerns. PMT will continue to support holistically.  Recommendations/Plan: DNR/DNI Limited scope of treatment Time for outcomes: trial of cortrak Continued PMT support- Chase Picket asks we reach out Monday 10/14   Code Status:    Code Status Orders  (From admission, onward)           Start     Ordered   01/28/23 1226  Do not attempt resuscitation (DNR)- Limited -Do Not Intubate (DNI)  (Code Status)  Continuous       Question Answer Comment  If pulseless and not breathing No CPR or chest compressions.   In Pre-Arrest Conditions (Patient Is Breathing and Has A Pulse) Do not intubate. Provide all appropriate non-invasive medical interventions. Avoid ICU transfer unless indicated or required.   Consent: Discussion documented in EHR or advanced directives reviewed      01/28/23 1226           Extensive chart review has been completed prior to seeing the patient and speaking with her daughter by phone including labs, vital signs, imagine, progress/consult notes, orders, medications, and available advance directive documents.  Care plan was discussed with bedside Dr. [name]  Time spent: 40 minutes  Thank you for allowing the Palliative Medicine Team to assist in the care of this patient.   Sherryll Burger, NP  Please contact Palliative Medicine Team phone at [phone] for questions and concerns.

## 2023-01-29 NOTE — Progress Notes (Signed)
PROGRESS NOTE    Amanda [name]  BMW:[id number] DOB: 08-15-1945 DOA: 01/18/2023 PCP: [name], MD  Subjective: Pt seen and examined. No family at bedside. Remains with Cortrak Has added prn IV morphine for severe pain and liquid hydrocodone for moderate pain via NG tube.  Had 2 doses of liquid hydrocodone in last 24 hours. 1318 yesterday and 0447 this AM.  Pt is moaning this AM. Looks uncomfortable. Still with flexiseal system with liquid stool   Hospital Course: HPI: Amanda [name] is a 77 y.o. female with medical history significant of moderate dementia.   Pt with 24h of progressively worsening AMS, now poorly responsive with GCS 7   BGL okay   H/o PNA and UTI causing sepsis back in June.   Family reports decreased PO intake recently "can't get her to eat anything".  They wonder about dental abscess.   Also has known decubitus ulcer.  Significant Events: Admitted 01/18/2023 for acute metabolic encephalopathy, hypernatremia and AKI 01-26-2023. Cortrak placed and tube feeding started 01-27-2023 refeeding syndrome/diarrhea started.  Significant Labs: Admitting WBC of 17K, Na 149, Scr 2.36  Significant Imaging Studies: Admitting MRI brain negative for acute CVA  Antibiotic Therapy: Anti-infectives (From admission, onward)    Start     Dose/Rate Route Frequency Ordered Stop   01/19/23 1900  ceFEPIme (MAXIPIME) 2 g in sodium chloride 0.9 % 100 mL IVPB  Status:  Discontinued        2 g 200 mL/hr over 30 Minutes Intravenous Every 24 hours 01/19/23 0338 01/19/23 0945   01/19/23 1000  cefTRIAXone (ROCEPHIN) 2 g in sodium chloride 0.9 % 100 mL IVPB       Note to Pharmacy: Retime as indicated   2 g 200 mL/hr over 30 Minutes Intravenous Every 24 hours 01/19/23 0945 01/25/23 1325   01/19/23 0342  vancomycin variable dose per unstable renal function (pharmacist dosing)  Status:  Discontinued         Does not apply See admin instructions 01/19/23 0343 01/20/23  0718   01/18/23 1830  vancomycin (VANCOREADY) IVPB 1250 mg/250 mL        1,250 mg 166.7 mL/hr over 90 Minutes Intravenous  Once 01/18/23 1818 01/18/23 2122   01/18/23 1815  ceFEPIme (MAXIPIME) 2 g in sodium chloride 0.9 % 100 mL IVPB        2 g 200 mL/hr over 30 Minutes Intravenous  Once 01/18/23 1807 01/18/23 1911       Procedures: 01-21-2023 EEG with moderate to severe diffuse encephalopathy, no seizures or epileptiform discharges Cortrak placed 01-26-2023  Consultants: Palliative Care    Assessment and Plan: * Acute metabolic encephalopathy On admission, pt's etiology of her acute metabolic encephalopathy was/is multifactorial. Admission Sodium 157 and BUN 138 definitely contributing / causing, some or all of this. Likely multifactorial (hypernatremia, uremia/AKI, dementia) but inciting cause unclear.  No clear infectious cause (CXR without active disease, CT maxillofacial without notable findings, CT abd/pelvis without acute findings, decub doesn't appear infected) TSH wnl, ammonia wnl  B12 (elevated), folate low -> supplement EEG with moderate to severe diffuse encephalopathy, no seizures or epileptiform discharges CT head without acute abnormality MRI brain without acute abnormality   01-29-2023 Pt has had tube feedings for 4 days now without any improvement in her cognition. I think she has severe dementia and this will not improve even with adequate enteral nutrition.  Pt's mental status has not improved since admission. This is likely progressive decline of her already established dementia.  Pt's hypernatremia and AKI resolved with IVF  Pt's mentation has not significantly improved.  Palliative care consulted and they discussed with pt's husband on 01-25-2023. Limited Cortrak feedings will start today. Cortrak placed on 01-26-2023.  01-28-2023. Pt has been on tube feedings since 01-26-2023. No appreciable change in her mentation with extra nutrition provided by NG feeds.  Having diarrhea from refeeding syndrome.  Refeeding syndrome Pt with diarrhea after tube feeds started. RD managing pt's diarrhea with slower TF rate and adding fiber to feeds. Will keep electrolytes balance with liquid Kcl via tube and IV magnesium.  SIRS (systemic inflammatory response syndrome) (HCC) Initial tachycardia, WBC 17k.  Pt had SIRS due to AKI and dehydration. Sepsis ruled out.  Procal was elevated at 0.32. pt given IV Rocephin for 7 days. But pt never had a clear source of infection.  All urine and blood cx were negative. RVP also negative.   Hypernatremia Severe dehydration due to decreased PO intake. Treated with 1.5L LR bolus in ED for question of sepsis. Sepsis ruled out.  Pt then given D5W at 75 cc/hr for hypernatremia. Pt's hypernatremia resolved after IVF.  Receiving free water via NG/cortrak.  Dysphagia Pt still not able to take po safely. Palliative care discussed with husband. Limited trial of cortrak feedings starting 01-26-2023.  See my family meeting note from 01-28-2023.  Decubitus ulcer Wound care consult on pt 01-19-2023 No obvious erythema, drainage, crepitus. Location: Sacral Pressure Injury POA: Yes.  Measurement:7cm x 7cm x 0.1cm (this included the area of deep tissue injury) with the partial thickness scattered skin loss  AKI (acute kidney injury) (HCC) On admission, AKI Very likely pre-renal / ATN with a very elevated BUN:Cr ratio, clinically appears dehydrated, and sodium of 157 today.  Pt treated with IVF and her AKI resolved.  Protein-calorie malnutrition, severe See RD note from 01-26-2023 Severe Malnutrition related to chronic illness (moderate dementia) as evidenced by severe fat depletion, severe muscle depletion.   Hypomagnesemia Repleted with IV magnesium. 01-27-2023. Due to refeeding syndrome/diarrhea. Have ordered IV mag  Hypokalemia Due to poor po intake. Repleted with IV KCL.  01-27-2023. Due to refeeding syndrome/diarrhea. Have  started scheduled kcl for potassium replacement.  DNR (do not resuscitate)/DNI(Do Not Intubate) Pt made DNR/DNI by Dr. Lowell Guitar on 01-25-2023.  Acute deep vein thrombosis (DVT) of left lower extremity (HCC) Seen on LE U/S. Initially placed on IV heparin. Changed to SQ lovenox 90 mg SQ qday.  Moderate dementia (HCC) Dementia for years. at baseline, wheelchair bound (walked 80 ft with walker with assistance in early September at rehab - wheelchair bound since being home), she's disoriented typically, but will communicate.   On admission, pt had acute encephalopathy with initial GCS 7. At baseline, she is confused to time and location.   Hypertension Chart diagnosis, doesn't appear to be on any chronic meds for this.   DVT prophylaxis:   Lovenox   Code Status: Limited: Do not attempt resuscitation (DNR) -DNR-LIMITED -Do Not Intubate/DNI  Family Communication: no family at bedside. Had family meeting on 01-28-2023 with dtr [name] and husband [name] Disposition Plan: SNF vs home Reason for continuing need for hospitalization: remains on NG tube feeds. Today is the 4th day of tube feeds.  Objective: Vitals:   01/28/23 1926 01/28/23 2330 01/29/23 0748 01/29/23 1121  BP: 106/67 (!) 131/57 98/64 129/64  Pulse: 94 100 97 100  Resp:      Temp: 98.3 F (36.8 C) 97.8 F (36.6 C) (!) 97.4 F (36.3 C)  98.5 F (36.9 C)  TempSrc: Oral Oral Axillary Oral  SpO2: 100% 100% 100% 100%  Weight:       No intake or output data in the 24 hours ending 01/29/23 1153 Filed Weights   01/19/23 0315 01/27/23 0442  Weight: 58.1 kg 79.4 kg    Examination:  Physical Exam Vitals and nursing note reviewed.  Constitutional:      Comments: Chronically ill appearing No awake this AM.  HENT:     Head: Normocephalic and atraumatic.     Nose: Nose normal.     Comments: Cortrak tube in nare. With bridle. Eyes:     General: No scleral icterus. Cardiovascular:     Rate and Rhythm: Normal rate and regular  rhythm.  Pulmonary:     Effort: Pulmonary effort is normal. No respiratory distress.     Breath sounds: Normal breath sounds.  Abdominal:     General: Bowel sounds are normal. There is no distension.  Musculoskeletal:     Left lower leg: 1+ Edema present.     Left ankle: Swelling present.     Left foot: Swelling present.     Comments: Remains with bilateral hand mittens to prevent pt pulling out Cortrak tube.  Skin:    General: Skin is warm and dry.     Capillary Refill: Capillary refill takes less than 2 seconds.  Neurological:     Comments: More awake this AM but moaning out loud. Appears in pain.     Data Reviewed: I have personally reviewed following labs and imaging studies  CBC: Recent Labs  Lab 01/22/23 1225 01/23/23 0840 01/24/23 1100 01/25/23 0353 01/26/23 0357 01/27/23 1217 01/28/23 0554 01/29/23 0430  WBC 10.8*   < > 10.7* 9.6 7.5 11.4* 10.7* 11.2*  NEUTROABS 7.8*  --  8.4* 7.5 5.3  --   --   --   HGB 10.7*   < > 10.6* 10.4* 10.0* 9.8* 9.6* 9.8*  HCT 35.5*   < > 33.2* 32.8* 31.5* 30.8* 31.0* 30.5*  MCV 83.9   < > 82.8 82.6 83.3 82.6 81.8 83.8  PLT 194   < > 240 240 267 258 283 287   < > = values in this interval not displayed.   Basic Metabolic Panel: Recent Labs  Lab 01/24/23 1100 01/25/23 0353 01/26/23 0357 01/27/23 0947 01/27/23 1217 01/28/23 0554 01/28/23 0917 01/29/23 0430  NA 137 138 141  --  140 140  --  138  K 2.8* 3.2* 3.1*  --  3.1* 3.8  --  4.5  CL 106 106 109  --  106 107  --  103  CO2 24 23 25   --  24 25  --  26  GLUCOSE 92 108* 90  --  114* 120*  --  112*  BUN <5* <5* <5*  --  <5* 5*  --  5*  CREATININE 0.45 0.47 0.55  --  0.58 0.60  --  0.51  CALCIUM 7.6* 7.5* 7.7*  --  7.7* 7.8*  --  8.2*  MG 1.4* 2.2 1.7 1.4*  --  2.0  --   --   PHOS 2.3* 2.0* 2.2* 2.3*  --   --  2.0*  --    GFR: Estimated Creatinine Clearance: 61.4 mL/min (by C-G formula based on SCr of 0.51 mg/dL). Liver Function Tests: Recent Labs  Lab 01/25/23 0353  01/26/23 0357 01/27/23 1217 01/28/23 0554 01/29/23 0430  AST 33 71* 49* 41 32  ALT 32 52* 52*  50* 45*  ALKPHOS 58 65 69 76 73  BILITOT 0.4 0.5 0.4 0.4 0.3  PROT 5.9* 5.9* 5.9* 6.0* 6.8  ALBUMIN 2.1* 2.1* 2.1* 2.1* 2.3*   Recent Labs  Lab 01/22/23 1225 01/24/23 1100  LIPASE 121* 49   CBG: Recent Labs  Lab 01/28/23 1925 01/28/23 2329 01/29/23 0344 01/29/23 0749 01/29/23 1120  GLUCAP 66* 103* 117* 126* 111*   Radiology Studies: No results found.  Scheduled Meds:  enoxaparin (LOVENOX) injection  90 mg Subcutaneous Q24H   fiber supplement (BANATROL TF)  60 mL Per Tube BID   folic acid  1 mg Intravenous Daily   free water  100 mL Per Tube Q4H   potassium chloride  20 mEq Per Tube BID   thiamine (VITAMIN B1) injection  100 mg Intravenous Daily   Continuous Infusions:  feeding supplement (OSMOLITE 1.2 CAL) 1,000 mL (01/26/23 1634)     LOS: 11 days   Time spent: 35 minutes  Carollee Herter, DO  [facility]  01/29/2023, 11:53 AM

## 2023-01-30 DIAGNOSIS — G9341 Metabolic encephalopathy: Secondary | ICD-10-CM | POA: Diagnosis not present

## 2023-01-30 DIAGNOSIS — I82412 Acute embolism and thrombosis of left femoral vein: Secondary | ICD-10-CM | POA: Diagnosis not present

## 2023-01-30 DIAGNOSIS — E870 Hyperosmolality and hypernatremia: Secondary | ICD-10-CM | POA: Diagnosis not present

## 2023-01-30 DIAGNOSIS — E878 Other disorders of electrolyte and fluid balance, not elsewhere classified: Secondary | ICD-10-CM | POA: Diagnosis not present

## 2023-01-30 LAB — CBC
HCT: 29.2 % — ABNORMAL LOW (ref 36.0–46.0)
Hemoglobin: 9.2 g/dL — ABNORMAL LOW (ref 12.0–15.0)
MCH: 25.8 pg — ABNORMAL LOW (ref 26.0–34.0)
MCHC: 31.5 g/dL (ref 30.0–36.0)
MCV: 81.8 fL (ref 80.0–100.0)
Platelets: 267 10*3/uL (ref 150–400)
RBC: 3.57 MIL/uL — ABNORMAL LOW (ref 3.87–5.11)
RDW: 15.6 % — ABNORMAL HIGH (ref 11.5–15.5)
WBC: 11.5 10*3/uL — ABNORMAL HIGH (ref 4.0–10.5)
nRBC: 0 % (ref 0.0–0.2)

## 2023-01-30 LAB — GLUCOSE, CAPILLARY
Glucose-Capillary: 135 mg/dL — ABNORMAL HIGH (ref 70–99)
Glucose-Capillary: 150 mg/dL — ABNORMAL HIGH (ref 70–99)
Glucose-Capillary: 143 mg/dL — ABNORMAL HIGH (ref 70–99)
Glucose-Capillary: 158 mg/dL — ABNORMAL HIGH (ref 70–99)
Glucose-Capillary: 126 mg/dL — ABNORMAL HIGH (ref 70–99)
Glucose-Capillary: 133 mg/dL — ABNORMAL HIGH (ref 70–99)
Glucose-Capillary: 116 mg/dL — ABNORMAL HIGH (ref 70–99)

## 2023-01-30 LAB — COMPREHENSIVE METABOLIC PANEL
ALT: 35 U/L (ref 0–44)
AST: 29 U/L (ref 15–41)
Albumin: 2.1 g/dL — ABNORMAL LOW (ref 3.5–5.0)
Alkaline Phosphatase: 67 U/L (ref 38–126)
Anion gap: 9 (ref 5–15)
BUN: 5 mg/dL — ABNORMAL LOW (ref 8–23)
CO2: 25 mmol/L (ref 22–32)
Calcium: 8.1 mg/dL — ABNORMAL LOW (ref 8.9–10.3)
Chloride: 101 mmol/L (ref 98–111)
Creatinine, Ser: 0.5 mg/dL (ref 0.44–1.00)
GFR, Estimated: >60 mL/min (ref >60)
Glucose, Bld: 154 mg/dL — ABNORMAL HIGH (ref 70–99)
Potassium: 4.9 mmol/L (ref 3.5–5.1)
Sodium: 135 mmol/L (ref 135–145)
Total Bilirubin: 0.4 mg/dL (ref 0.3–1.2)
Total Protein: 6.3 g/dL — ABNORMAL LOW (ref 6.5–8.1)

## 2023-01-30 LAB — MAGNESIUM: Magnesium: 1.4 mg/dL — ABNORMAL LOW (ref 1.7–2.4)

## 2023-01-30 LAB — PREALBUMIN: Prealbumin: 7 mg/dL — ABNORMAL LOW (ref 18–38)

## 2023-01-30 MED ORDER — ENOXAPARIN SODIUM 80 MG/0.8ML IJ SOSY
80.0000 mg | PREFILLED_SYRINGE | INTRAMUSCULAR | Status: DC
  Filled 2023-01-30: qty 0.8

## 2023-01-30 MED ORDER — FREE WATER
50.0000 mL | Status: DC
  Administered 2023-01-30 – 2023-02-01 (×10): 50 mL

## 2023-01-30 MED ORDER — ENOXAPARIN SODIUM 80 MG/0.8ML IJ SOSY
80.0000 mg | PREFILLED_SYRINGE | INTRAMUSCULAR | Status: DC
  Administered 2023-01-30 – 2023-01-31 (×2): 80 mg via SUBCUTANEOUS
  Filled 2023-01-30: qty 0.8

## 2023-01-30 MED ORDER — FUROSEMIDE 10 MG/ML IJ SOLN
20.0000 mg | Freq: Once | INTRAMUSCULAR | Status: AC
  Administered 2023-01-30: 20 mg via INTRAVENOUS
  Filled 2023-01-30: qty 2

## 2023-01-30 NOTE — Progress Notes (Signed)
PROGRESS NOTE    Amanda [name]  WUJ:[id number] DOB: Feb 12, 1946 DOA: 01/18/2023 PCP: [name], MD  Subjective: Pt seen and examined. No family at bedside. Remains with Cortrak  Had 1 dose of liquid hydrocodone in last 24 hours. 9562 yesterday. Had 2 doses of IV morphine in last 24 hours. 1804 yesterday and 0157 this AM.  Pt is awake but not responsive. Does not appear to be in pain.  Still with liquid stool in flexiseal. RN charting all liquid stools since tube feeds have started.  Serum albumin remains low at 2.1. have added pre-albumin to this AM labs.  Cortrak placed 01-26-2023. Tube feedings started on 01-26-2023 @ 1634. Today is Day #5 of tube feeds. No appreciable positive change in pt's cognition since tube feeds have started.     Hospital Course: HPI: Amanda [name] is a 77 y.o. female with medical history significant of moderate dementia.   Pt with 24h of progressively worsening AMS, now poorly responsive with GCS 7   BGL okay   H/o PNA and UTI causing sepsis back in June.   Family reports decreased PO intake recently "can't get her to eat anything".  They wonder about dental abscess.   Also has known decubitus ulcer.  Significant Events: Admitted 01/18/2023 for acute metabolic encephalopathy, hypernatremia and AKI 01-26-2023. Cortrak placed and tube feeding started 01-27-2023 refeeding syndrome/diarrhea started.  Significant Labs: Admitting WBC of 17K, Na 149, Scr 2.36  Significant Imaging Studies: Admitting MRI brain negative for acute CVA  Antibiotic Therapy: Anti-infectives (From admission, onward)    Start     Dose/Rate Route Frequency Ordered Stop   01/19/23 1900  ceFEPIme (MAXIPIME) 2 g in sodium chloride 0.9 % 100 mL IVPB  Status:  Discontinued        2 g 200 mL/hr over 30 Minutes Intravenous Every 24 hours 01/19/23 0338 01/19/23 0945   01/19/23 1000  cefTRIAXone (ROCEPHIN) 2 g in sodium chloride 0.9 % 100 mL IVPB       Note to  Pharmacy: Retime as indicated   2 g 200 mL/hr over 30 Minutes Intravenous Every 24 hours 01/19/23 0945 01/25/23 1325   01/19/23 0342  vancomycin variable dose per unstable renal function (pharmacist dosing)  Status:  Discontinued         Does not apply See admin instructions 01/19/23 0343 01/20/23 0718   01/18/23 1830  vancomycin (VANCOREADY) IVPB 1250 mg/250 mL        1,250 mg 166.7 mL/hr over 90 Minutes Intravenous  Once 01/18/23 1818 01/18/23 2122   01/18/23 1815  ceFEPIme (MAXIPIME) 2 g in sodium chloride 0.9 % 100 mL IVPB        2 g 200 mL/hr over 30 Minutes Intravenous  Once 01/18/23 1807 01/18/23 1911       Procedures: 01-21-2023 EEG with moderate to severe diffuse encephalopathy, no seizures or epileptiform discharges Cortrak placed 01-26-2023  Consultants: Palliative Care    Assessment and Plan: * Acute metabolic encephalopathy On admission, pt's etiology of her acute metabolic encephalopathy was/is multifactorial. Admission Sodium 157 and BUN 138 definitely contributing / causing, some or all of this. Likely multifactorial (hypernatremia, uremia/AKI, dementia) but inciting cause unclear.  No clear infectious cause (CXR without active disease, CT maxillofacial without notable findings, CT abd/pelvis without acute findings, decub doesn't appear infected) TSH wnl, ammonia wnl  B12 (elevated), folate low -> supplement EEG with moderate to severe diffuse encephalopathy, no seizures or epileptiform discharges CT head without acute abnormality MRI  brain without acute abnormality   01-29-2023 Pt has had tube feedings for 4 days now without any improvement in her cognition. I think she has severe dementia and this will not improve even with adequate enteral nutrition.  Pt's mental status has not improved since admission. This is likely progressive decline of her already established dementia.  Pt's hypernatremia and AKI resolved with IVF  Pt's mentation has not significantly  improved.  Palliative care consulted and they discussed with pt's husband on 01-25-2023. Limited Cortrak feedings will start today. Cortrak placed on 01-26-2023.  01-28-2023. Pt has been on tube feedings since 01-26-2023. No appreciable change in her mentation with extra nutrition provided by NG feeds. Having diarrhea from refeeding syndrome.  01-30-2023. Serum albumin remains low at 2.1 g/dl despite 5 days of tube feedings. Adding pre-albumin today's labs. Pt still without any appreciable positive change in her cognition despite tube feedings. 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 Not the few weeks that family states she had. This is observation is further justified in that pt's serum albumin was 4.0 on 09-06-2022. But on 10-10-2022, her serum albumin had dropped to 2.7. so sometime between may and June is when she stopped eating enough protein to keep up her albumin stores. So basically 4 months without any significant protein intake.  This is drastically different than family reported "only a few weeks" that pt has not been eating well. In my opinion, pt really needs hospice care.  Refeeding syndrome Pt with diarrhea after tube feeds started. RD managing pt's diarrhea with slower TF rate and adding fiber to feeds. Will keep electrolytes balance with liquid Kcl via tube and IV magnesium.  SIRS (systemic inflammatory response syndrome) (HCC) Initial tachycardia, WBC 17k.  Pt had SIRS due to AKI and dehydration. Sepsis ruled out.  Procal was elevated at 0.32. pt given IV Rocephin for 7 days. But pt never had a clear source of infection.  All urine and blood cx were negative. RVP also negative.   Hypernatremia Severe dehydration due to decreased PO intake. Treated with 1.5L LR bolus in ED for question of sepsis. Sepsis ruled out.  Pt then given D5W at 75 cc/hr for hypernatremia. Pt's  hypernatremia resolved after IVF.  Receiving free water via NG/cortrak. Her hypernatremia has resolved.  Dysphagia Pt still not able to take po safely. Palliative care discussed with husband. Limited trial of cortrak feedings starting 01-26-2023.  See my family meeting note from 01-28-2023.  Decubitus ulcer Wound care consult on pt 01-19-2023 No obvious erythema, drainage, crepitus. Location: Sacral Pressure Injury POA: Yes.  Measurement:7cm x 7cm x 0.1cm (this included the area of deep tissue injury) with the partial thickness scattered skin loss  AKI (acute kidney injury) (HCC) On admission, AKI Very likely pre-renal / ATN with a very elevated BUN:Cr ratio, clinically appears dehydrated, and sodium of 157 today.  Pt treated with IVF and her AKI resolved.  Protein-calorie malnutrition, severe See RD note from 01-26-2023 Severe Malnutrition related to chronic illness (moderate dementia) as evidenced by severe fat depletion, severe muscle depletion.   Hypomagnesemia Repleted with IV magnesium. 01-27-2023. Due to refeeding syndrome/diarrhea. Have ordered IV mag  Hypokalemia Due to poor po intake. Repleted with IV KCL.  01-27-2023. Due to refeeding syndrome/diarrhea. Have started scheduled kcl for potassium replacement.  DNR (do not resuscitate)/DNI(Do Not Intubate) Pt made DNR/DNI by Dr. Lowell Guitar on 01-25-2023.  Acute deep vein thrombosis (DVT) of left lower extremity (HCC) Seen on LE U/S. Initially placed on IV heparin. Changed to SQ lovenox 90 mg SQ qday.  Moderate dementia (HCC) Dementia for years. at baseline, wheelchair bound (walked 80 ft with walker with assistance in early September at rehab - wheelchair bound since being home), she's disoriented typically, but will communicate.   On admission, pt had acute encephalopathy with initial GCS 7. At baseline, she is confused to time and location.   Hypertension Chart diagnosis, doesn't appear to be on any chronic meds for  this.   DVT prophylaxis:   Lovenox   Code Status: Limited: Do not attempt resuscitation (DNR) -DNR-LIMITED -Do Not Intubate/DNI  Family Communication: no family at bedside. No family seen at bedside since my family conference with them on 01-28-2023 Disposition Plan: SNF vs hospice Reason for continuing need for hospitalization: remains on Cortrak/NG tube feeds  Objective: Vitals:   01/29/23 2346 01/30/23 0157 01/30/23 0430 01/30/23 0743  BP: (!) 129/55 (!) 121/53 (!) 125/54 (!) 106/55  Pulse: (!) 124 (!) 125 (!) 124   Resp: 18  18 18   Temp: 98.9 F (37.2 C)  99.3 F (37.4 C) 98.2 F (36.8 C)  TempSrc: Oral  Oral   SpO2:  100% 99% 99%  Weight:        Intake/Output Summary (Last 24 hours) at 01/30/2023 1019 Last data filed at 01/30/2023 0143 Gross per 24 hour  Intake 1126.66 ml  Output --  Net 1126.66 ml   Filed Weights   01/19/23 0315 01/27/23 0442  Weight: 58.1 kg 79.4 kg    Examination:  Physical Exam Vitals and nursing note reviewed.  Constitutional:      Comments: Chronically ill appearing awake this AM but not responsive to verbal stimuli. Is non-verbal.  HENT:     Head: Normocephalic and atraumatic.     Nose: Nose normal.     Comments: Cortrak tube in nare. With bridle. Eyes:     General: No scleral icterus. Cardiovascular:     Rate and Rhythm: Normal rate and regular rhythm.  Pulmonary:     Effort: Pulmonary effort is normal. No respiratory distress.     Breath sounds: Normal breath sounds.  Abdominal:     General: Bowel sounds are normal. There is no distension.  Musculoskeletal:     Left lower leg: 1+ Edema present.     Left ankle: Swelling present.     Left foot: Swelling present.     Comments: Remains with bilateral hand mittens to prevent pt pulling out Cortrak tube.  Skin:    General: Skin is warm and dry.     Capillary Refill: Capillary refill takes less than 2 seconds.  Neurological:     Comments: Awake this AM but non-verbal.     Data Reviewed: I have personally reviewed following labs and imaging studies  CBC: Recent Labs  Lab 01/24/23 1100 01/25/23 0353 01/26/23 0357 01/27/23 1217 01/28/23 0554 01/29/23 0430 01/30/23 0543  WBC 10.7* 9.6 7.5 11.4* 10.7* 11.2* 11.5*  NEUTROABS 8.4* 7.5 5.3  --   --   --   --   HGB 10.6* 10.4* 10.0* 9.8* 9.6* 9.8* 9.2*  HCT 33.2* 32.8* 31.5* 30.8* 31.0* 30.5* 29.2*  MCV 82.8 82.6 83.3 82.6 81.8 83.8 81.8  PLT 240 240 267 258 283 287 267   Basic Metabolic Panel: Recent Labs  Lab 01/24/23 1100 01/25/23 0353 01/26/23 0357 01/27/23 0947 01/27/23 1217 01/28/23 0554 01/28/23 0917 01/29/23 0430  01/30/23 0543  NA 137 138 141  --  140 140  --  138 135  K 2.8* 3.2* 3.1*  --  3.1* 3.8  --  4.5 4.9  CL 106 106 109  --  106 107  --  103 101  CO2 24 23 25   --  24 25  --  26 25  GLUCOSE 92 108* 90  --  114* 120*  --  112* 154*  BUN <5* <5* <5*  --  <5* 5*  --  5* 5*  CREATININE 0.45 0.47 0.55  --  0.58 0.60  --  0.51 0.50  CALCIUM 7.6* 7.5* 7.7*  --  7.7* 7.8*  --  8.2* 8.1*  MG 1.4* 2.2 1.7 1.4*  --  2.0  --   --   --   PHOS 2.3* 2.0* 2.2* 2.3*  --   --  2.0*  --   --    GFR: Estimated Creatinine Clearance: 61.4 mL/min (by C-G formula based on SCr of 0.5 mg/dL). Liver Function Tests: Recent Labs  Lab 01/26/23 0357 01/27/23 1217 01/28/23 0554 01/29/23 0430 01/30/23 0543  AST 71* 49* 41 32 29  ALT 52* 52* 50* 45* 35  ALKPHOS 65 69 76 73 67  BILITOT 0.5 0.4 0.4 0.3 0.4  PROT 5.9* 5.9* 6.0* 6.8 6.3*  ALBUMIN 2.1* 2.1* 2.1* 2.3* 2.1*   Recent Labs  Lab 01/24/23 1100  LIPASE 49   CBG: Recent Labs  Lab 01/29/23 2049 01/29/23 2350 01/30/23 0201 01/30/23 0604 01/30/23 0741  GLUCAP 127* 135* 150* 143* 158*   Radiology Studies: No results found.  Scheduled Meds:  enoxaparin (LOVENOX) injection  90 mg Subcutaneous Q24H   fiber supplement (BANATROL TF)  60 mL Per Tube BID   folic acid  1 mg Intravenous Daily   free water  100 mL Per Tube Q4H    potassium chloride  20 mEq Per Tube BID   thiamine (VITAMIN B1) injection  100 mg Intravenous Daily   Continuous Infusions:  feeding supplement (OSMOLITE 1.2 CAL) 50 mL/hr at 01/30/23 0143     LOS: 12 days   Time spent: 35 minutes  Carollee Herter, DO  [facility]  01/30/2023, 10:19 AM

## 2023-01-30 NOTE — Progress Notes (Signed)
PHARMACY - ANTICOAGULATION   Pharmacy Consult for heparin > enoxaparin Indication: LLE DVT  No Known Allergies  Patient Measurements: Weight: 79.4 kg (175 lb) Heparin Dosing Weight: 58.1kg  Vital Signs: Temp: 98.2 F (36.8 C) (10/13 0743) Temp Source: Oral (10/13 0430) BP: 106/55 (10/13 0743) Pulse Rate: 124 (10/13 0430)  Labs: Recent Labs    01/28/23 0554 01/29/23 0430 01/30/23 0543  HGB 9.6* 9.8* 9.2*  HCT 31.0* 30.5* 29.2*  PLT 283 287 267  CREATININE 0.60 0.51 0.50    Estimated Creatinine Clearance: 61.4 mL/min (by C-G formula based on SCr of 0.5 mg/dL).  Assessment: 77 y.o. female with new LLE DVT for heparin. No AC prior to admission.  Patient remains NPO. CBC has been stable, no s/sx of bleeding noted per RN. Has been continued on enoxaparin 90 mg daily.   eCrCl 48 mL/min w/ BMI 18.93. Of note, patient's weight did decrease to 51.6 kg from 58.1 kg. Confirmed weight with RN.  Goal of Therapy:  Monitor platelets by anticoagulation protocol: Yes   Plan:  Decrease enoxaparin to 80 mg (1.5 mg/kg) Q24H (77.4mg  rounded to 80mg  for ease of dosing for nursing) Daily CBC and s/sx of bleeding F/u [facility] plans pending GOC discussions   Lennie Muckle, PharmD PGY1 Pharmacy Resident 01/30/2023 11:12 AM

## 2023-01-30 NOTE — Plan of Care (Signed)
  Problem: Pain Managment: Goal: General experience of comfort will improve Outcome: Progressing   Problem: Safety: Goal: Ability to remain free from injury will improve Outcome: Progressing   

## 2023-01-30 NOTE — Plan of Care (Signed)

## 2023-01-31 DIAGNOSIS — I82412 Acute embolism and thrombosis of left femoral vein: Secondary | ICD-10-CM | POA: Diagnosis not present

## 2023-01-31 DIAGNOSIS — Z515 Encounter for palliative care: Secondary | ICD-10-CM | POA: Diagnosis not present

## 2023-01-31 DIAGNOSIS — E870 Hyperosmolality and hypernatremia: Secondary | ICD-10-CM | POA: Diagnosis not present

## 2023-01-31 DIAGNOSIS — E878 Other disorders of electrolyte and fluid balance, not elsewhere classified: Secondary | ICD-10-CM | POA: Diagnosis not present

## 2023-01-31 DIAGNOSIS — G9341 Metabolic encephalopathy: Secondary | ICD-10-CM | POA: Diagnosis not present

## 2023-01-31 LAB — COMPREHENSIVE METABOLIC PANEL
ALT: 33 U/L (ref 0–44)
AST: 31 U/L (ref 15–41)
Albumin: 2 g/dL — ABNORMAL LOW (ref 3.5–5.0)
Alkaline Phosphatase: 58 U/L (ref 38–126)
Anion gap: 8 (ref 5–15)
BUN: 7 mg/dL — ABNORMAL LOW (ref 8–23)
CO2: 26 mmol/L (ref 22–32)
Calcium: 8.1 mg/dL — ABNORMAL LOW (ref 8.9–10.3)
Chloride: 101 mmol/L (ref 98–111)
Creatinine, Ser: 0.53 mg/dL (ref 0.44–1.00)
GFR, Estimated: >60 mL/min (ref >60)
Glucose, Bld: 131 mg/dL — ABNORMAL HIGH (ref 70–99)
Potassium: 4.7 mmol/L (ref 3.5–5.1)
Sodium: 135 mmol/L (ref 135–145)
Total Bilirubin: 0.2 mg/dL — ABNORMAL LOW (ref 0.3–1.2)
Total Protein: 6.1 g/dL — ABNORMAL LOW (ref 6.5–8.1)

## 2023-01-31 LAB — GLUCOSE, CAPILLARY
Glucose-Capillary: 109 mg/dL — ABNORMAL HIGH (ref 70–99)
Glucose-Capillary: 117 mg/dL — ABNORMAL HIGH (ref 70–99)
Glucose-Capillary: 118 mg/dL — ABNORMAL HIGH (ref 70–99)
Glucose-Capillary: 125 mg/dL — ABNORMAL HIGH (ref 70–99)
Glucose-Capillary: 139 mg/dL — ABNORMAL HIGH (ref 70–99)

## 2023-01-31 LAB — MAGNESIUM: Magnesium: 1.4 mg/dL — ABNORMAL LOW (ref 1.7–2.4)

## 2023-01-31 LAB — CBC
HCT: 26 % — ABNORMAL LOW (ref 36.0–46.0)
Hemoglobin: 8.2 g/dL — ABNORMAL LOW (ref 12.0–15.0)
MCH: 25.3 pg — ABNORMAL LOW (ref 26.0–34.0)
MCHC: 31.5 g/dL (ref 30.0–36.0)
MCV: 80.2 fL (ref 80.0–100.0)
Platelets: 238 10*3/uL (ref 150–400)
RBC: 3.24 MIL/uL — ABNORMAL LOW (ref 3.87–5.11)
RDW: 15.6 % — ABNORMAL HIGH (ref 11.5–15.5)
WBC: 7.8 10*3/uL (ref 4.0–10.5)
nRBC: 0 % (ref 0.0–0.2)

## 2023-01-31 MED ORDER — MAGNESIUM SULFATE 2 GM/50ML IV SOLN
2.0000 g | Freq: Once | INTRAVENOUS | Status: AC
  Administered 2023-01-31: 2 g via INTRAVENOUS
  Filled 2023-01-31: qty 50

## 2023-01-31 NOTE — Progress Notes (Signed)
PROGRESS NOTE    Amanda [name]  JYN:[id number] DOB: September 28, 1945 DOA: 01/18/2023 PCP: [name], MD  Subjective: Pt seen and examined. No family at bedside. Remains with Cortrak  More awake today.  Still nonverbal.  1 dose of IV morphine at 2309 last night.  No liquid hydrocodone use in the last 24 hours.  Still with liquid stool in flexiseal. RN charting all liquid stools since tube feeds have started.  Serum albumin remains low at 2.0.  Prealbumin low at 7 (reference range 18 to 38 mg/dL) This is indicative of long-term malnutrition.  Cortrak placed 01-26-2023. Tube feedings started on 01-26-2023 @ 1634. Today is Day #6 of tube feeds.  Patient is more awake now than she has been prior to her tube feeds starting.  However the patient is still nonverbal.  While she is more awake on tube feeds, I have not seen any improvement in the patient's cognition.  Nurse was concerned about some right leg edema and some blistering..  I think the leg edema is likely due to appropriate water intake now from her tube feeds and free water.  I think the blisters on her feet are likely due to patient rubbing her feet against the mattress.  She has an air mattress ready for pressure ulcer prevention.  I also think that the edema is due to her profound hypoalbuminemia from severe protein calorie malnutrition that she has sustained since May/June 2024.   Hospital Course: HPI: Amanda [name] is a 77 y.o. female with medical history significant of moderate dementia.   Pt with 24h of progressively worsening AMS, now poorly responsive with GCS 7   BGL okay   H/o PNA and UTI causing sepsis back in June.   Family reports decreased PO intake recently "can't get her to eat anything".  They wonder about dental abscess.   Also has known decubitus ulcer.  Significant Events: Admitted 01/18/2023 for acute metabolic encephalopathy, hypernatremia and AKI 01-26-2023. Cortrak placed and tube feeding  started 01-27-2023 refeeding syndrome/diarrhea started.  Significant Labs: Admitting WBC of 17K, Na 149, Scr 2.36  Significant Imaging Studies: Admitting MRI brain negative for acute CVA  Antibiotic Therapy: Anti-infectives (From admission, onward)    Start     Dose/Rate Route Frequency Ordered Stop   01/19/23 1900  ceFEPIme (MAXIPIME) 2 g in sodium chloride 0.9 % 100 mL IVPB  Status:  Discontinued        2 g 200 mL/hr over 30 Minutes Intravenous Every 24 hours 01/19/23 0338 01/19/23 0945   01/19/23 1000  cefTRIAXone (ROCEPHIN) 2 g in sodium chloride 0.9 % 100 mL IVPB       Note to Pharmacy: Retime as indicated   2 g 200 mL/hr over 30 Minutes Intravenous Every 24 hours 01/19/23 0945 01/25/23 1325   01/19/23 0342  vancomycin variable dose per unstable renal function (pharmacist dosing)  Status:  Discontinued         Does not apply See admin instructions 01/19/23 0343 01/20/23 0718   01/18/23 1830  vancomycin (VANCOREADY) IVPB 1250 mg/250 mL        1,250 mg 166.7 mL/hr over 90 Minutes Intravenous  Once 01/18/23 1818 01/18/23 2122   01/18/23 1815  ceFEPIme (MAXIPIME) 2 g in sodium chloride 0.9 % 100 mL IVPB        2 g 200 mL/hr over 30 Minutes Intravenous  Once 01/18/23 1807 01/18/23 1911       Procedures: 01-21-2023 EEG with moderate to severe diffuse  encephalopathy, no seizures or epileptiform discharges Cortrak placed 01-26-2023  Consultants: Palliative Care    Assessment and Plan: * Acute metabolic encephalopathy On admission, pt's etiology of her acute metabolic encephalopathy was/is multifactorial. Admission Sodium 157 and BUN 138 definitely contributing / causing, some or all of this. Likely multifactorial (hypernatremia, uremia/AKI, dementia) but inciting cause unclear.  No clear infectious cause (CXR without active disease, CT maxillofacial without notable findings, CT abd/pelvis without acute findings, decub doesn't appear infected) TSH wnl, ammonia wnl  B12  (elevated), folate low -> supplement EEG with moderate to severe diffuse encephalopathy, no seizures or epileptiform discharges CT head without acute abnormality MRI brain without acute abnormality   01-29-2023 Pt has had tube feedings for 4 days now without any improvement in her cognition. I think she has severe dementia and this will not improve even with adequate enteral nutrition.  Pt's mental status has not improved since admission. This is likely progressive decline of her already established dementia.  Pt's hypernatremia and AKI resolved with IVF  Pt's mentation has not significantly improved.  Palliative care consulted and they discussed with pt's husband on 01-25-2023. Limited Cortrak feedings will start today. Cortrak placed on 01-26-2023.  01-28-2023. Pt has been on tube feedings since 01-26-2023. No appreciable change in her mentation with extra nutrition provided by NG feeds. Having diarrhea from refeeding syndrome.  01-30-2023. Serum albumin remains low at 2.1 g/dl despite 5 days of tube feedings. Adding pre-albumin today's labs. Pt still without any appreciable positive change in her cognition despite tube feedings. 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 Not the few weeks that family states she had. This is observation is further justified in that pt's serum albumin was 4.0 on 09-06-2022. But on 10-10-2022, her serum albumin had dropped to 2.7. so sometime between may and June is when she stopped eating enough protein to keep up her albumin stores. So basically 4 months without any significant protein intake.  This is drastically different than family reported "only a few weeks" that pt has not been eating well. In my opinion, pt really needs hospice care.  01-31-2023.  Today is day #6 of enteral feeds.  Her prealbumin is low at 7.  This signifies significant  malnutrition over the last several weeks to months.  This is in contrast to the family reported history of only 2 to 4 weeks of malnutrition.  Patient is somewhat more awake but still nonverbal.  Awaiting palliative medicine to reengage with the family.  As discussed before during my family meeting on 01/28/2023, we will not continue enteral feedings past 10 days via NG tube.  Family will need to decide if they want to pursue PEG tube at that time.  Refeeding syndrome Pt with diarrhea after tube feeds started. RD managing pt's diarrhea with slower TF rate and adding fiber to feeds. Will keep electrolytes balance with liquid Kcl via tube and IV magnesium.  SIRS (systemic inflammatory response syndrome) (HCC) Initial tachycardia, WBC 17k.  Pt had SIRS due to AKI and dehydration. Sepsis ruled out.  Procal was elevated at 0.32. pt given IV Rocephin for 7 days. But pt never had a clear source of infection.  All urine and blood cx were negative. RVP also negative.   Hypernatremia Severe dehydration due to decreased PO intake. Treated with 1.5L LR bolus in ED for question of sepsis. Sepsis ruled out.  Pt  then given D5W at 75 cc/hr for hypernatremia. Pt's hypernatremia resolved after IVF.  Receiving free water via NG/cortrak. Her hypernatremia has resolved.  Dysphagia Pt still not able to take po safely. Palliative care discussed with husband. Limited trial of cortrak feedings starting 01-26-2023.  See my family meeting note from 01-28-2023.  Decubitus ulcer Wound care consult on pt 01-19-2023 No obvious erythema, drainage, crepitus. Location: Sacral Pressure Injury POA: Yes.  Measurement:7cm x 7cm x 0.1cm (this included the area of deep tissue injury) with the partial thickness scattered skin loss  AKI (acute kidney injury) (HCC) On admission, AKI Very likely pre-renal / ATN with a very elevated BUN:Cr ratio, clinically appears dehydrated, and sodium of 157 today.  Pt treated with IVF and her AKI  resolved.  Protein-calorie malnutrition, severe See RD note from 01-26-2023 Severe Malnutrition related to chronic illness (moderate dementia) as evidenced by severe fat depletion, severe muscle depletion.   Hypomagnesemia Repleted with IV magnesium. 01-27-2023. Due to refeeding syndrome/diarrhea. Have ordered IV mag  Hypokalemia Due to poor po intake. Repleted with IV KCL.  01-27-2023. Due to refeeding syndrome/diarrhea. Have started scheduled kcl for potassium replacement.  DNR (do not resuscitate)/DNI(Do Not Intubate) Pt made DNR/DNI by Dr. Lowell Guitar on 01-25-2023.  Acute deep vein thrombosis (DVT) of left lower extremity (HCC) Seen on LE U/S. Initially placed on IV heparin. Changed to SQ lovenox 90 mg SQ qday.  Moderate dementia (HCC) Dementia for years. at baseline, wheelchair bound (walked 80 ft with walker with assistance in early September at rehab - wheelchair bound since being home), she's disoriented typically, but will communicate.   On admission, pt had acute encephalopathy with initial GCS 7. At baseline, she is confused to time and location.   Hypertension Chart diagnosis, doesn't appear to be on any chronic meds for this.    DVT prophylaxis:   Lovenox   Code Status: Limited: Do not attempt resuscitation (DNR) -DNR-LIMITED -Do Not Intubate/DNI  Family Communication: no family at bedside. Family has only been to the hospital once since my family with them 01-28-2023. Disposition Plan: return home vs SNF Reason for continuing need for hospitalization: remains with NG feeding.  Objective: Vitals:   01/31/23 0649 01/31/23 0758 01/31/23 0800 01/31/23 1217  BP:  97/67  124/65  Pulse:  (!) 109    Resp:  18  16  Temp:  98 F (36.7 C)  98.1 F (36.7 C)  TempSrc:  Oral  Axillary  SpO2:  98%    Weight: 85 kg  83 kg     Intake/Output Summary (Last 24 hours) at 01/31/2023 1327 Last data filed at 01/31/2023 1610 Gross per 24 hour  Intake 659 ml  Output --  Net  659 ml   Filed Weights   01/31/23 0634 01/31/23 0649 01/31/23 0800  Weight: 61 kg 85 kg 83 kg    Examination:  Physical Exam Vitals and nursing note reviewed.  Constitutional:      Comments: Chronically ill appearing awake this AM but not responsive to verbal stimuli. Is non-verbal.  HENT:     Head: Normocephalic and atraumatic.     Nose: Nose normal.     Comments: Cortrak tube in nare. With bridle. Eyes:     General: No scleral icterus. Cardiovascular:     Rate and Rhythm: Normal rate and regular rhythm.  Pulmonary:     Effort: Pulmonary effort is normal. No respiratory distress.     Breath sounds: Normal breath sounds.  Abdominal:  General: Bowel sounds are normal. There is no distension.  Musculoskeletal:     Left lower leg: 1+ Edema present.     Left ankle: Swelling present.     Left foot: Swelling present.     Comments: Remains with bilateral hand mittens to prevent pt pulling out Cortrak tube.  Has some mild right lower leg edema.  The dorsum of her right foot is covered with bandages.  The amount of edema is not impressive.  Rated trace to +1 in the right foot and trace in the pretibial area of the right leg.  Skin:    General: Skin is warm and dry.     Capillary Refill: Capillary refill takes less than 2 seconds.  Neurological:     Comments: Awake this AM but non-verbal.     Data Reviewed: I have personally reviewed following labs and imaging studies  CBC: Recent Labs  Lab 01/25/23 0353 01/26/23 0357 01/27/23 1217 01/28/23 0554 01/29/23 0430 01/30/23 0543 01/31/23 0820  WBC 9.6 7.5 11.4* 10.7* 11.2* 11.5* 7.8  NEUTROABS 7.5 5.3  --   --   --   --   --   HGB 10.4* 10.0* 9.8* 9.6* 9.8* 9.2* 8.2*  HCT 32.8* 31.5* 30.8* 31.0* 30.5* 29.2* 26.0*  MCV 82.6 83.3 82.6 81.8 83.8 81.8 80.2  PLT 240 267 258 283 287 267 238   Basic Metabolic Panel: Recent Labs  Lab 01/25/23 0353 01/26/23 0357 01/27/23 0947 01/27/23 1217 01/28/23 0554 01/28/23 0917  01/29/23 0430 01/30/23 0543 01/31/23 0820  NA 138 141  --  140 140  --  138 135 135  K 3.2* 3.1*  --  3.1* 3.8  --  4.5 4.9 4.7  CL 106 109  --  106 107  --  103 101 101  CO2 23 25  --  24 25  --  26 25 26   GLUCOSE 108* 90  --  114* 120*  --  112* 154* 131*  BUN <5* <5*  --  <5* 5*  --  5* 5* 7*  CREATININE 0.47 0.55  --  0.58 0.60  --  0.51 0.50 0.53  CALCIUM 7.5* 7.7*  --  7.7* 7.8*  --  8.2* 8.1* 8.1*  MG 2.2 1.7 1.4*  --  2.0  --   --  1.4* 1.4*  PHOS 2.0* 2.2* 2.3*  --   --  2.0*  --   --   --    GFR: Estimated Creatinine Clearance: 62.7 mL/min (by C-G formula based on SCr of 0.53 mg/dL). Liver Function Tests: Recent Labs  Lab 01/27/23 1217 01/28/23 0554 01/29/23 0430 01/30/23 0543 01/31/23 0820  AST 49* 41 32 29 31  ALT 52* 50* 45* 35 33  ALKPHOS 69 76 73 67 58  BILITOT 0.4 0.4 0.3 0.4 0.2*  PROT 5.9* 6.0* 6.8 6.3* 6.1*  ALBUMIN 2.1* 2.1* 2.3* 2.1* 2.0*   CBG: Recent Labs  Lab 01/30/23 1543 01/30/23 2022 01/31/23 0032 01/31/23 0631 01/31/23 0846  GLUCAP 133* 116* 139* 118* 125*   Radiology Studies: No results found.  Scheduled Meds:  enoxaparin (LOVENOX) injection  80 mg Subcutaneous Q24H   fiber supplement (BANATROL TF)  60 mL Per Tube BID   folic acid  1 mg Intravenous Daily   free water  50 mL Per Tube Q4H   potassium chloride  20 mEq Per Tube BID   thiamine (VITAMIN B1) injection  100 mg Intravenous Daily   Continuous Infusions:  feeding supplement (OSMOLITE  1.2 CAL) 1,000 mL (01/31/23 0622)     LOS: 13 days   Time spent: 40 minutes  Carollee Herter, DO  [facility]  01/31/2023, 1:27 PM

## 2023-01-31 NOTE — Progress Notes (Signed)
   Palliative Medicine Inpatient Follow Up Note HPI: 77 y.o. female  with past medical history of moderate dementia admitted on 01/18/2023 with AMS and poor PO intake. She was found to have AKI and hypernatremia.   Palliative care has been asked to get involved to further assist with goals of care conversations.   Today's Discussion 01/31/2023  *Please note that this is a verbal dictation therefore any spelling or grammatical errors are due to the "Dragon Medical One" system interpretation.  Chart reviewed inclusive of vital signs, progress notes, laboratory results, and diagnostic images.   I met at bedside with Amanda [name] this morning. She is awake and alert. She de-robbing herself. She is not coherent and laughs nonsensically.   I called patients spouse to provide an update though reach VM. A HIPAA compliant message was left.  Per nursing review there are no palliative care concerns this afternoon.   Plan for continued coretrack feedings for trial of 10 days. Today is day 6/10.   Questions and concerns addressed/Palliative Support Provided.   Objective Assessment: Vital Signs Vitals:   01/31/23 0610 01/31/23 0758  BP: 115/61 97/67  Pulse: (!) 106 (!) 109  Resp: 14 18  Temp: 99.7 F (37.6 C) 98 F (36.7 C)  SpO2: 100% 98%    Intake/Output Summary (Last 24 hours) at 01/31/2023 1149 Last data filed at 01/31/2023 1610 Gross per 24 hour  Intake 659 ml  Output --  Net 659 ml   Last Weight  Most recent update: 01/31/2023  8:53 AM    Weight  83 kg (182 lb 15.7 oz)            Gen:  Frail Elderly AA F chronically ill in appearance HEENT: Dry mucous membranes CV: Regular rate and rhythm  PULM:  On RA, breathing is even and nonlabored ABD: soft/nontender  EXT: (+) muscle wasting (-) edema  Neuro: Opens eyes though disoriented  SUMMARY OF RECOMMENDATIONS   DNAR/DNI  Trial of coretrack feeding (day 6/10)  Strict delirium precuations  Ongoing PMT support   Time -  35 ______________________________________________________________________________________ Amanda [name] Palliative Medicine Team Team Cell Phone: [phone] Please utilize secure chat with additional questions, if there is no response within 30 minutes please call the above phone number  Palliative Medicine Team providers are available by phone from 7am to 7pm daily and can be reached through the team cell phone.  Should this patient require assistance outside of these hours, please call the patient's attending physician.

## 2023-01-31 NOTE — Care Management Important Message (Signed)
Important Message  Patient Details  Name: SHAWNTEE MAINWARING MRN: [id number] Date of Birth: 1945-09-27   Important Message Given:        Dorena Bodo 01/31/2023, 2:54 PM

## 2023-01-31 NOTE — Plan of Care (Signed)

## 2023-01-31 NOTE — Plan of Care (Signed)
  Problem: Coping: Goal: Ability to adjust to condition or change in health will improve Outcome: Progressing   Problem: Fluid Volume: Goal: Ability to maintain a balanced intake and output will improve Outcome: Progressing   Problem: Metabolic: Goal: Ability to maintain appropriate glucose levels will improve Outcome: Progressing   Problem: Nutritional: Goal: Maintenance of adequate nutrition will improve Outcome: Progressing Goal: Progress toward achieving an optimal weight will improve Outcome: Progressing   Problem: Skin Integrity: Goal: Risk for impaired skin integrity will decrease Outcome: Progressing   Problem: Tissue Perfusion: Goal: Adequacy of tissue perfusion will improve Outcome: Progressing   Problem: Clinical Measurements: Goal: Ability to maintain clinical measurements within normal limits will improve Outcome: Progressing Goal: Will remain free from infection Outcome: Progressing Goal: Diagnostic test results will improve Outcome: Progressing Goal: Respiratory complications will improve Outcome: Progressing Goal: Cardiovascular complication will be avoided Outcome: Progressing   Problem: Activity: Goal: Risk for activity intolerance will decrease Outcome: Progressing   Problem: Nutrition: Goal: Adequate nutrition will be maintained Outcome: Progressing   Problem: Coping: Goal: Level of anxiety will decrease Outcome: Progressing   Problem: Elimination: Goal: Will not experience complications related to bowel motility Outcome: Progressing Goal: Will not experience complications related to urinary retention Outcome: Progressing   Problem: Pain Managment: Goal: General experience of comfort will improve Outcome: Progressing   Problem: Safety: Goal: Ability to remain free from injury will improve Outcome: Progressing   Problem: Skin Integrity: Goal: Risk for impaired skin integrity will decrease Outcome: Progressing   Problem:  Education: Goal: Ability to describe self-care measures that may prevent or decrease complications (Diabetes Survival Skills Education) will improve Outcome: Not Progressing Goal: Individualized Educational Video(s) Outcome: Not Progressing   Problem: Health Behavior/Discharge Planning: Goal: Ability to identify and utilize available resources and services will improve Outcome: Not Progressing Goal: Ability to manage health-related needs will improve Outcome: Not Progressing   Problem: Education: Goal: Knowledge of General Education information will improve Description: Including pain rating scale, medication(s)/side effects and non-pharmacologic comfort measures Outcome: Not Progressing   Problem: Health Behavior/Discharge Planning: Goal: Ability to manage health-related needs will improve Outcome: Not Progressing   Patient has history of dementia, is nonverbal, difficult to assess patient's ability to self manage health related needs

## 2023-01-31 NOTE — Plan of Care (Signed)
  Problem: Health Behavior/Discharge Planning: Goal: Ability to identify and utilize available resources and services will improve Outcome: Progressing   Problem: Nutritional: Goal: Maintenance of adequate nutrition will improve Outcome: Progressing Goal: Progress toward achieving an optimal weight will improve Outcome: Progressing   Problem: Skin Integrity: Goal: Risk for impaired skin integrity will decrease Outcome: Progressing   Problem: Tissue Perfusion: Goal: Adequacy of tissue perfusion will improve Outcome: Progressing   Problem: Education: Goal: Knowledge of General Education information will improve Description: Including pain rating scale, medication(s)/side effects and non-pharmacologic comfort measures Outcome: Progressing   Problem: Clinical Measurements: Goal: Ability to maintain clinical measurements within normal limits will improve Outcome: Progressing Goal: Will remain free from infection Outcome: Progressing Goal: Diagnostic test results will improve Outcome: Progressing Goal: Respiratory complications will improve Outcome: Progressing Goal: Cardiovascular complication will be avoided Outcome: Progressing   Problem: Activity: Goal: Risk for activity intolerance will decrease Outcome: Progressing   Problem: Coping: Goal: Level of anxiety will decrease Outcome: Progressing   Problem: Pain Managment: Goal: General experience of comfort will improve Outcome: Progressing   Problem: Safety: Goal: Ability to remain free from injury will improve Outcome: Progressing   Problem: Skin Integrity: Goal: Risk for impaired skin integrity will decrease Outcome: Progressing   Problem: Nutrition: Goal: Adequate nutrition will be maintained Outcome: Not Progressing   Problem: Elimination: Goal: Will not experience complications related to bowel motility Outcome: Not Progressing Goal: Will not experience complications related to urinary  retention Outcome: Not Progressing   Problem: Education: Goal: Ability to describe self-care measures that may prevent or decrease complications (Diabetes Survival Skills Education) will improve Outcome: Not Met (add Reason) Goal: Individualized Educational Video(s) Outcome: Not Met (add Reason)   Problem: Coping: Goal: Ability to adjust to condition or change in health will improve Outcome: Not Met (add Reason)   Problem: Fluid Volume: Goal: Ability to maintain a balanced intake and output will improve Outcome: Not Met (add Reason)   Problem: Health Behavior/Discharge Planning: Goal: Ability to manage health-related needs will improve Outcome: Not Met (add Reason)   Problem: Health Behavior/Discharge Planning: Goal: Ability to manage health-related needs will improve Outcome: Not Met (add Reason)

## 2023-02-01 ENCOUNTER — Other Ambulatory Visit (HOSPITAL_COMMUNITY): Payer: Self-pay

## 2023-02-01 DIAGNOSIS — Z7189 Other specified counseling: Secondary | ICD-10-CM | POA: Diagnosis not present

## 2023-02-01 DIAGNOSIS — E870 Hyperosmolality and hypernatremia: Secondary | ICD-10-CM | POA: Diagnosis not present

## 2023-02-01 DIAGNOSIS — E878 Other disorders of electrolyte and fluid balance, not elsewhere classified: Secondary | ICD-10-CM | POA: Diagnosis not present

## 2023-02-01 DIAGNOSIS — L89150 Pressure ulcer of sacral region, unstageable: Secondary | ICD-10-CM | POA: Diagnosis not present

## 2023-02-01 DIAGNOSIS — Z515 Encounter for palliative care: Secondary | ICD-10-CM | POA: Diagnosis not present

## 2023-02-01 DIAGNOSIS — G9341 Metabolic encephalopathy: Secondary | ICD-10-CM | POA: Diagnosis not present

## 2023-02-01 LAB — GLUCOSE, CAPILLARY
Glucose-Capillary: 118 mg/dL — ABNORMAL HIGH (ref 70–99)
Glucose-Capillary: 119 mg/dL — ABNORMAL HIGH (ref 70–99)
Glucose-Capillary: 119 mg/dL — ABNORMAL HIGH (ref 70–99)
Glucose-Capillary: 122 mg/dL — ABNORMAL HIGH (ref 70–99)
Glucose-Capillary: 128 mg/dL — ABNORMAL HIGH (ref 70–99)
Glucose-Capillary: 95 mg/dL (ref 70–99)

## 2023-02-01 MED ORDER — HYDROCODONE-ACETAMINOPHEN 7.5-325 MG/15ML PO SOLN: 10.0000 mL | ORAL | Status: DC | PRN

## 2023-02-01 MED ORDER — THIAMINE MONONITRATE 100 MG PO TABS: 100.0000 mg | ORAL_TABLET | Freq: Every day | ORAL | Status: DC

## 2023-02-01 MED ORDER — ALBUMIN HUMAN 25 % IV SOLN
25.0000 g | INTRAVENOUS | Status: AC
  Administered 2023-02-01: 25 g via INTRAVENOUS
  Filled 2023-02-01: qty 100

## 2023-02-01 MED ORDER — FOLIC ACID 1 MG PO TABS
1.0000 mg | ORAL_TABLET | Freq: Every day | ORAL | Status: DC
  Administered 2023-02-01: 1 mg
  Filled 2023-02-01: qty 1

## 2023-02-01 MED ORDER — THIAMINE MONONITRATE 100 MG PO TABS
100.0000 mg | ORAL_TABLET | Freq: Every day | ORAL | Status: DC
  Administered 2023-02-01: 100 mg
  Filled 2023-02-01: qty 1

## 2023-02-01 MED ORDER — THIAMINE HCL 100 MG/ML IJ SOLN
100.0000 mg | Freq: Every day | INTRAMUSCULAR | Status: AC
  Administered 2023-02-01 – 2023-02-03 (×3): 100 mg via INTRAVENOUS
  Filled 2023-02-01 (×3): qty 2

## 2023-02-01 MED ORDER — ENSURE ENLIVE PO LIQD
237.0000 mL | Freq: Two times a day (BID) | ORAL | Status: DC
  Administered 2023-02-01 – 2023-03-01 (×20): 237 mL via ORAL

## 2023-02-01 MED ORDER — APIXABAN 5 MG PO TABS
5.0000 mg | ORAL_TABLET | Freq: Two times a day (BID) | ORAL | Status: DC
  Administered 2023-02-01 – 2023-03-02 (×53): 5 mg via ORAL
  Filled 2023-02-01 (×58): qty 1

## 2023-02-01 MED ORDER — ACETAMINOPHEN 160 MG/5ML PO SOLN
650.0000 mg | Freq: Four times a day (QID) | ORAL | Status: DC | PRN
  Administered 2023-02-02 – 2023-02-24 (×4): 650 mg via ORAL
  Filled 2023-02-01 (×4): qty 20.3

## 2023-02-01 MED ORDER — MEDIHONEY WOUND/BURN DRESSING EX PSTE
1.0000 | PASTE | Freq: Every day | CUTANEOUS | Status: DC
  Administered 2023-02-01 – 2023-03-02 (×30): 1 via TOPICAL
  Filled 2023-02-01 (×3): qty 44

## 2023-02-01 MED ORDER — DEXTROSE IN LACTATED RINGERS 5 % IV SOLN: INTRAVENOUS | Status: DC

## 2023-02-01 NOTE — Plan of Care (Signed)

## 2023-02-01 NOTE — Consult Note (Addendum)
WOC Nurse Consult Note: Reason for Consult: Consult requested for sacrum.  Initial WOC consult was performed on 10/2; Pt was noted to have a Deep tissue pressure injury to the sacrum which was present on admission. This has evolved into an Unstageable pressure injury; 50% red, 50% yellow slough, 6X6X.2cm, mod amt tan drainage.  Pt is emaciated and incontinent and has multiple systemic factors which can impair healing.  Requested to call daughter and discuss etiology of the DTPI prior to admission and how they frequently evolve into full thickness tissue loss within 7-10 days, also discussed topical treatment. She denies further questions at this time.  Pressure Injury POA: Yes Dressing procedure/placement/frequency: Pt is on a low airloss mattress to reduce pressure. Topical treatment orders provided for bedside nurses to perform as follows: Apply Medihoney to sacrum Q day, then cover with foam dressing. Change foam dressing Q 3 days or PRN soiling. Please re-consult if further assistance is needed.  Thank-you,  Cammie Mcgee MSN, RN, CWOCN, [place], CNS [phone]

## 2023-02-01 NOTE — TOC Benefit Eligibility Note (Signed)
Patient Product/process development scientist completed.    The patient is insured through [employer]/ [employer]. Patient has Medicare and is not eligible for a copay card, but may be able to apply for patient assistance, if available.    Ran test claim for Eliquis 5 mg and the current 30 day co-pay is $47.00 .   This test claim was processed through [facility]- copay amounts may vary at other pharmacies due to pharmacy/plan contracts, or as the patient moves through the different stages of their insurance plan.     Roland Earl, CPHT Pharmacy Technician III Certified Patient Advocate [facility] Patient Advocate Team Direct Number: [phone]  Fax: [phone]

## 2023-02-01 NOTE — Progress Notes (Addendum)
  Poor oral intake in the setting of acute metabolic encephalopathy-resolved Refeeding syndrome-resolved Dysphagia -Patient's nurse reported quartet has removed today and patient has poor oral intake.  Patient's blood pressure 89/66 and heart rate 124. -In the setting of soft blood pressure, hypoalbuminemia decubitus ulcer giving albumin bolus and starting starting D5 LR 75 cc/h for 1 day. -Currently on soft mechanical diet. - Need to continue encouraging oral hydration.  Tereasa Coop, MD [facility] 02/01/2023, 8:04 PM

## 2023-02-01 NOTE — Discharge Instructions (Signed)
Information on my medicine - ELIQUIS (apixaban)  This medication education was reviewed with me or my healthcare representative as part of my discharge preparation.    Why was Eliquis prescribed for you? Eliquis was prescribed to treat blood clots that may have been found in the veins of your legs (deep vein thrombosis) or in your lungs (pulmonary embolism) and to reduce the risk of them occurring again.  What do You need to know about Eliquis ? Continue Eliquist 5 mg tablet taken TWICE daily.  Eliquis may be taken with or without food.   Try to take the dose about the same time in the morning and in the evening. If you have difficulty swallowing the tablet whole please discuss with your pharmacist how to take the medication safely.  Take Eliquis exactly as prescribed and DO NOT stop taking Eliquis without talking to the doctor who prescribed the medication.  Stopping may increase your risk of developing a new blood clot.  Refill your prescription before you run out.  After discharge, you should have regular check-up appointments with your healthcare provider that is prescribing your Eliquis.    What do you do if you miss a dose? If a dose of ELIQUIS is not taken at the scheduled time, take it as soon as possible on the same day and twice-daily administration should be resumed. The dose should not be doubled to make up for a missed dose.  Important Safety Information A possible side effect of Eliquis is bleeding. You should call your healthcare provider right away if you experience any of the following: Bleeding from an injury or your nose that does not stop. Unusual colored urine (red or dark brown) or unusual colored stools (red or black). Unusual bruising for unknown reasons. A serious fall or if you hit your head (even if there is no bleeding).  Some medicines may interact with Eliquis and might increase your risk of bleeding or clotting while on Eliquis. To help avoid this,  consult your healthcare provider or pharmacist prior to using any new prescription or non-prescription medications, including herbals, vitamins, non-steroidal anti-inflammatory drugs (NSAIDs) and supplements.  This website has more information on Eliquis (apixaban): http://[email or web address].com/eliquis/home

## 2023-02-01 NOTE — Progress Notes (Signed)
PROGRESS NOTE    Amanda [name]  WUJ:[id number] DOB: 1945/08/04 DOA: 01/18/2023 PCP: [name], MD  Subjective: Pt seen and examined. No family at bedside. Remains with Cortrak  More awake today.  Still nonverbal.  No IV morphine given last 24 hours. No liquid hydrocodone use in the last 24 hours.  Still with liquid stool in flexiseal. RN charting all liquid stools since tube feeds have started.  Serum albumin remains low at 2.0.  Prealbumin low at 7 (reference range 18 to 38 mg/dL) This is indicative of long-term malnutrition.  Cortrak placed 01-26-2023. Tube feedings started on 01-26-2023 @ 1634. Today is Day #7 of tube feeds.  Patient is more awake now than she has been prior to her tube feeds starting.  However the patient is still nonverbal.  While she is more awake on tube feeds, I have not seen any improvement in the patient's cognition.  Pt is constantly rubbing her feet together. I think this is where her blisters are coming from.   Hospital Course: HPI: Amanda [name] is a 77 y.o. female with medical history significant of moderate dementia.   Pt with 24h of progressively worsening AMS, now poorly responsive with GCS 7   BGL okay   H/o PNA and UTI causing sepsis back in June.   Family reports decreased PO intake recently "can't get her to eat anything".  They wonder about dental abscess.   Also has known decubitus ulcer.  Significant Events: Admitted 01/18/2023 for acute metabolic encephalopathy, hypernatremia and AKI 01-25-2023 pt made DNR/DNI 01-26-2023. Cortrak placed and tube feeding started 01-27-2023 refeeding syndrome/diarrhea started. 01-26-2023 thru 02-01-2023. 7 day trial of enteral nutrition has not improved pt's cognition and dementia. Cortrak removed.  Significant Labs: Admitting WBC of 17K, Na 149, Scr 2.36, alb 3.3 01-30-2023 prealbumin 7(ref range 18-38 mg/dl)  Significant Imaging Studies: Admitting MRI brain negative for acute  CVA  Antibiotic Therapy: Anti-infectives (From admission, onward)    Start     Dose/Rate Route Frequency Ordered Stop   01/19/23 1900  ceFEPIme (MAXIPIME) 2 g in sodium chloride 0.9 % 100 mL IVPB  Status:  Discontinued        2 g 200 mL/hr over 30 Minutes Intravenous Every 24 hours 01/19/23 0338 01/19/23 0945   01/19/23 1000  cefTRIAXone (ROCEPHIN) 2 g in sodium chloride 0.9 % 100 mL IVPB       Note to Pharmacy: Retime as indicated   2 g 200 mL/hr over 30 Minutes Intravenous Every 24 hours 01/19/23 0945 01/25/23 1325   01/19/23 0342  vancomycin variable dose per unstable renal function (pharmacist dosing)  Status:  Discontinued         Does not apply See admin instructions 01/19/23 0343 01/20/23 0718   01/18/23 1830  vancomycin (VANCOREADY) IVPB 1250 mg/250 mL        1,250 mg 166.7 mL/hr over 90 Minutes Intravenous  Once 01/18/23 1818 01/18/23 2122   01/18/23 1815  ceFEPIme (MAXIPIME) 2 g in sodium chloride 0.9 % 100 mL IVPB        2 g 200 mL/hr over 30 Minutes Intravenous  Once 01/18/23 1807 01/18/23 1911       Procedures: 01-21-2023 EEG with moderate to severe diffuse encephalopathy, no seizures or epileptiform discharges Cortrak placed 01-26-2023 Cortrak removed 02-01-2023  Consultants: Palliative Care    Assessment and Plan: * Acute metabolic encephalopathy On admission, pt's etiology of her acute metabolic encephalopathy was/is multifactorial. Admission Sodium 157 and BUN 138  definitely contributing / causing, some or all of this. Likely multifactorial (hypernatremia, uremia/AKI, dementia) but inciting cause unclear.  No clear infectious cause (CXR without active disease, CT maxillofacial without notable findings, CT abd/pelvis without acute findings, decub doesn't appear infected) TSH wnl, ammonia wnl  B12 (elevated), folate low -> supplement EEG with moderate to severe diffuse encephalopathy, no seizures or epileptiform discharges CT head without acute abnormality MRI  brain without acute abnormality   01-29-2023 Pt has had tube feedings for 4 days now without any improvement in her cognition. I think she has severe dementia and this will not improve even with adequate enteral nutrition.  Pt's mental status has not improved since admission. This is likely progressive decline of her already established dementia.  Pt's hypernatremia and AKI resolved with IVF  Pt's mentation has not significantly improved.  Palliative care consulted and they discussed with pt's husband on 01-25-2023. Limited Cortrak feedings will start today. Cortrak placed on 01-26-2023.  01-28-2023. Pt has been on tube feedings since 01-26-2023. No appreciable change in her mentation with extra nutrition provided by NG feeds. Having diarrhea from refeeding syndrome.  01-30-2023. Serum albumin remains low at 2.1 g/dl despite 5 days of tube feedings. Adding pre-albumin today's labs. Pt still without any appreciable positive change in her cognition despite tube feedings. 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 Not the few weeks that family states she had. This is observation is further justified in that pt's serum albumin was 4.0 on 09-06-2022. But on 10-10-2022, her serum albumin had dropped to 2.7. so sometime between may and June is when she stopped eating enough protein to keep up her albumin stores. So basically 4 months without any significant protein intake.  This is drastically different than family reported "only a few weeks" that pt has not been eating well. In my opinion, pt really needs hospice care.  01-31-2023.  Today is day #6 of enteral feeds.  Her prealbumin is low at 7.  This signifies significant malnutrition over the last several weeks to months.  This is in contrast to the family reported history of only 2 to 4 weeks of malnutrition.  Patient is somewhat more awake  but still nonverbal.  Awaiting palliative medicine to reengage with the family.  As discussed before during my family meeting on 01/28/2023, we will not continue enteral feedings past 10 days via NG tube.  Family will need to decide if they want to pursue PEG tube at that time.  02-01-2023 pt has completed 7 days of tube feeds. Pt is more awake now but cognition is still quite poor. Definitively, tube feeds and enteral nutrition has not helped her dementia. Discussed this at length with pt's husband [name] on the phone. Additional days of enteral feeds will not help and only place her at risk for aspiration. [name] says that he and family do not want pt to have long term PEG. I discussed that NG/Cortrak tube would come out today.   Pt to remain on pureed diet. Will ask same ST who saw her last week to re-evaluate her. I think she will likely remain on pureed diet but will have formal re-consult by ST to determine if her diet can be upgraded from pureed.  Refeeding syndrome Pt with diarrhea after tube feeds started. RD managing pt's diarrhea with slower TF rate and adding fiber to feeds. Will keep electrolytes balance with  liquid Kcl via tube and IV magnesium.  02-01-2023. Cortrak to be removed. Serum prealbumin is very low indicative of several months of poor nutritional status. Her refeeding syndrome/diarrhea will resolve once tube feeds are turned off. I expect it to resolve in next 24-48 hours.  SIRS (systemic inflammatory response syndrome) (HCC) Initial tachycardia, WBC 17k.  Pt had SIRS due to AKI and dehydration. Sepsis ruled out.  Procal was elevated at 0.32. pt given IV Rocephin for 7 days. But pt never had a clear source of infection.  All urine and blood cx were negative. RVP also negative.   Hypernatremia Severe dehydration due to decreased PO intake. Treated with 1.5L LR bolus in ED for question of sepsis. Sepsis ruled out.  Pt then given D5W at 75 cc/hr for hypernatremia. Pt's  hypernatremia resolved after IVF.  Receiving free water via NG/cortrak. Her hypernatremia has resolved.  02-01-2023 cortrak to be removed today. Pt will need to take all of her hydration via free water by mouth. I am not optimistic that she can stay hydrated enough to keep her out of the hospital.  Dysphagia Pt still not able to take po safely. Palliative care discussed with husband. Limited trial of cortrak feedings starting 01-26-2023.  See my family meeting note from 01-28-2023.  Decubitus ulcer Wound care consult on pt 01-19-2023 No obvious erythema, drainage, crepitus. Location: Sacral Pressure Injury POA: Yes.  Measurement:7cm x 7cm x 0.1cm (this included the area of deep tissue injury) with the partial thickness scattered skin loss  02-01-2023 discussed with pt's husband. He lives alone with his wife. Both children are out of state. Son lives in [address] [place]) and dtr lives in [place].  Husband unable to provide the care pt needs in terms of wound care. He does not have 24 hours assistance at home for patient.  Therefore, pt's only choice is SNF placement. Discussed with husband that he and family ultimately will decide what SNF pt will go to, but she will be going to SNF. Unless family decides on hospice care.  AKI (acute kidney injury) (HCC) On admission, AKI Very likely pre-renal / ATN with a very elevated BUN:Cr ratio, clinically appears dehydrated, and sodium of 157 today.  Pt treated with IVF and her AKI resolved.  Protein-calorie malnutrition, severe See RD note from 01-26-2023 Severe Malnutrition related to chronic illness (moderate dementia) as evidenced by severe fat depletion, severe muscle depletion.   Hypomagnesemia Repleted with IV magnesium. 01-27-2023. Due to refeeding syndrome/diarrhea. Have ordered IV mag 01-31-2023 resolved.  Hypokalemia Due to poor po intake. Repleted with IV KCL.  01-27-2023. Due to refeeding syndrome/diarrhea. Have started  scheduled kcl for potassium replacement.  DNR (do not resuscitate)/DNI(Do Not Intubate) Pt made DNR/DNI by Dr. Lowell Guitar on 01-25-2023.  Acute deep vein thrombosis (DVT) of left lower extremity (HCC) Seen on LE U/S. Initially placed on IV heparin. Changed to SQ lovenox 90 mg SQ qday.  Moderate dementia (HCC) Dementia for years. at baseline, wheelchair bound (walked 80 ft with walker with assistance in early September at rehab - wheelchair bound since being home), she's disoriented typically, but will communicate.   On admission, pt had acute encephalopathy with initial GCS 7. At baseline, she is confused to time and location.   02-01-2023 definitively, pt's cognition has not improved with good enteral nutrition in the past 7 days. It has only caused massive diarrhea in her due to refeeding syndrome. Her dementia is not going to get better. Family has declined PEG tube. She  really needs hospice care. I'm not sure that family is ready for that step. Will have Dr. Lowell Guitar see the patient tomorrow. He was pt's attending last week.  Hypertension Chart diagnosis, doesn't appear to be on any chronic meds for this.    DVT prophylaxis:  apixaban (ELIQUIS) tablet 5 mg     Code Status: Limited: Do not attempt resuscitation (DNR) -DNR-LIMITED -Do Not Intubate/DNI  Family Communication: discussed with pt's husband [name]. Discussed that pt's cognition/dementia has not improved with dementia. That she is incredibly weak and that she is only taking pureed diet. Explained to him that he may observe her eating but this is clearly not enough to sustain her. [name] stated that family will not pursue long term PEG tube. I told him that I am not going to endanger pt's life with continue NG tube feedings that have not improved her clinical condition. Pt's dementia has been progressive and will not improve.  Discussed that [name] is unable to provide care for patient at home and cannot provide wound care in a manner  that will heal and keep clean the pt's wound. His only option is SNF placement. Told him that he and family can ultimately decide which SNF pt is going to but CM will start SNF process today.  Disposition Plan: SNF Reason for continuing need for hospitalization: medically stable. Needs SNF placement.  Objective: Vitals:   01/31/23 1800 01/31/23 1934 02/01/23 0347 02/01/23 0743  BP:  (!) 120/50 (!) 120/55 (!) 129/54  Pulse:  (!) 106 (!) 101   Resp:   20 16  Temp:  98.1 F (36.7 C) 98.8 F (37.1 C) 97.9 F (36.6 C)  TempSrc:  Oral Oral   SpO2:   100% 100%  Weight: 50 kg       Intake/Output Summary (Last 24 hours) at 02/01/2023 1430 Last data filed at 02/01/2023 0930 Gross per 24 hour  Intake 550 ml  Output --  Net 550 ml   Filed Weights   01/31/23 0634 01/31/23 0649 01/31/23 1800  Weight: 61 kg 85 kg 50 kg    Examination:  Physical Exam Vitals and nursing note reviewed.  Constitutional:      Comments: Chronically ill appearing awake this AM but is non-verbal.  HENT:     Head: Normocephalic and atraumatic.     Nose: Nose normal.     Comments: Cortrak tube in nare. With bridle. Eyes:     General: No scleral icterus. Cardiovascular:     Rate and Rhythm: Normal rate and regular rhythm.  Pulmonary:     Effort: Pulmonary effort is normal. No respiratory distress.     Breath sounds: Normal breath sounds.  Abdominal:     General: Bowel sounds are normal. There is no distension.  Musculoskeletal:     Left lower leg: 1+ Edema present.     Left ankle: Swelling present.     Left foot: Swelling present.     Comments: Remains with bilateral hand mittens to prevent pt pulling out Cortrak tube.  Has some mild right lower leg edema.   Pt is constantly rubbing her feet together. The dorsum of her right foot is covered with bandages.   The amount of edema is not impressive.  Rated trace to +1 in the right foot and trace in the pretibial area of the right leg.  Skin:     General: Skin is warm and dry.     Capillary Refill: Capillary refill takes less than 2 seconds.  Neurological:     Comments: Awake this AM but non-verbal.    Data Reviewed: I have personally reviewed following labs and imaging studies  CBC: Recent Labs  Lab 01/26/23 0357 01/27/23 1217 01/28/23 0554 01/29/23 0430 01/30/23 0543 01/31/23 0820  WBC 7.5 11.4* 10.7* 11.2* 11.5* 7.8  NEUTROABS 5.3  --   --   --   --   --   HGB 10.0* 9.8* 9.6* 9.8* 9.2* 8.2*  HCT 31.5* 30.8* 31.0* 30.5* 29.2* 26.0*  MCV 83.3 82.6 81.8 83.8 81.8 80.2  PLT 267 258 283 287 267 238   Basic Metabolic Panel: Recent Labs  Lab 01/26/23 0357 01/27/23 0947 01/27/23 1217 01/28/23 0554 01/28/23 0917 01/29/23 0430 01/30/23 0543 01/31/23 0820  NA 141  --  140 140  --  138 135 135  K 3.1*  --  3.1* 3.8  --  4.5 4.9 4.7  CL 109  --  106 107  --  103 101 101  CO2 25  --  24 25  --  26 25 26   GLUCOSE 90  --  114* 120*  --  112* 154* 131*  BUN <5*  --  <5* 5*  --  5* 5* 7*  CREATININE 0.55  --  0.58 0.60  --  0.51 0.50 0.53  CALCIUM 7.7*  --  7.7* 7.8*  --  8.2* 8.1* 8.1*  MG 1.7 1.4*  --  2.0  --   --  1.4* 1.4*  PHOS 2.2* 2.3*  --   --  2.0*  --   --   --    GFR: Estimated Creatinine Clearance: 46.5 mL/min (by C-G formula based on SCr of 0.53 mg/dL). Liver Function Tests: Recent Labs  Lab 01/27/23 1217 01/28/23 0554 01/29/23 0430 01/30/23 0543 01/31/23 0820  AST 49* 41 32 29 31  ALT 52* 50* 45* 35 33  ALKPHOS 69 76 73 67 58  BILITOT 0.4 0.4 0.3 0.4 0.2*  PROT 5.9* 6.0* 6.8 6.3* 6.1*  ALBUMIN 2.1* 2.1* 2.3* 2.1* 2.0*   CBG: Recent Labs  Lab 01/31/23 2010 02/01/23 0021 02/01/23 0349 02/01/23 0741 02/01/23 1228  GLUCAP 117* 119* 118* 128* 122*   Radiology Studies: No results found.  Scheduled Meds:  apixaban  5 mg Oral BID   feeding supplement  237 mL Oral BID BM   thiamine (VITAMIN B1) injection  100 mg Intravenous Daily   Continuous Infusions:   LOS: 14 days   Time spent:  35 minutes  Carollee Herter, DO  [facility]  02/01/2023, 2:30 PM

## 2023-02-01 NOTE — Progress Notes (Signed)
Spoke with pt's dtr Sheree via phone. I'm rounding at [facility] this afternoon. Unable to come to bedside to have conversation in person.  Discussed with the daughter that the patient has already had 7 days of tube feedings.  Her dementia has not really gotten any better.  She is however more awake.  Husband states that he does not want her to have a long-term feeding tube.  Discussed my hesitation with continuing tube feeds via NG tube due to risk for aspiration, nasal septal ulceration, etc.  Husband states that the tube in her nose interferes with the patient getting food into her mouth.  I had speech therapy see her again.  This was the same therapist that saw her last week prior to tube feed initiation.  He does not see any difference in the patient's ability to swallow and she remains on a dysphagia diet (pured) with thin liquids.  Discussed with the daughter that the patient really needs to consume close to 2000 to 3000 cal a day with 50 to 100 g of protein a day in order to heal her decubitus ulcer.  Discussed that the only reason that her decubitus ulcer has not gotten worse recently from the refeeding syndrome diarrhea is due to the rectal tube that she has.  She cannot go to a nursing home with a rectal tube.  In order to prevent her decubitus ulcer from getting soiled from feces, she would need a diverting colostomy.  I do not believe that she would survive a surgical operation like that.  I think her nutritional status is too poor.  Husband states that he cannot take care of her at home.  Therefore the only alternative is for the patient to go to her nursing home for wound care.  Patient gets very tired with eating.  She may eat better with the family.  Speech therapy was only able to get 1 teaspoon of Magic cup into her.  Patient refused to eat anything else.  I told the daughter that the family may have better chance of getting her to eat then hospital personnel.  Discussed that  given her failure to thrive, that eating requires energy output in the form of muscle movement and that patient may get tired from actually eating.  Discussed with the daughter that prognostically, I think in 3 to 6 months, the patient will have a major event.  She either have a bout of aspiration pneumonia, UTI from dehydration, infected decubitus ulcer, or another event related to poor nutrition, debility and her worsening dementia.  I asked the daughter to have a conversation with her brother and her father about what the patient's wishes are and how she would perceive her quality of life at this point.  Discussed with the daughter that their point of view should be from the patient's point of view and not from the feelings of loss and pain they would have if the patient would die.  I gave the daughter an opportunity answer questions.  I tried to answer them to the best of my ability.  I have requested that Dr. Lowell Guitar be reassigned to the patient's case.  I did tell the daughter that it would be beneficial for Dr. Lowell Guitar to see the patient again as he was the one to take care of her during the first 7 days of her hospitalization and that he has not seen her in 7 days.  I think that the difference between what he remembers the  patient was like last week and what she will be like tomorrow will be immeasurably invaluable.  The things that have changed during Dr. Roanna Banning absence is 7 days of enteral nutrition and whether or not he feels that the patient has not had any clinical benefit of tube feeds(after not seeing her in 7 days).  Carollee Herter, DO [facility]

## 2023-02-01 NOTE — Progress Notes (Signed)
PHARMACY - ANTICOAGULATION   Pharmacy Consult for heparin > enoxaparin>>apixaban Indication: LLE DVT  No Known Allergies  Patient Measurements: Weight: 50 kg (110 lb 3.7 oz) Heparin Dosing Weight: 58.1kg  Vital Signs: Temp: 97.9 F (36.6 C) (10/15 0743) Temp Source: Oral (10/15 0347) BP: 129/54 (10/15 0743) Pulse Rate: 101 (10/15 0347)  Labs: Recent Labs    01/30/23 0543 01/31/23 0820  HGB 9.2* 8.2*  HCT 29.2* 26.0*  PLT 267 238  CREATININE 0.50 0.53    Estimated Creatinine Clearance: 46.5 mL/min (by C-G formula based on SCr of 0.53 mg/dL).  Assessment: 77 y.o. female with new LLE DVT for heparin. No AC prior to admission.  Patient remains NPO. CBC has been stable, no s/sx of bleeding noted per RN. Has been continued on enoxaparin 90 mg daily.   Plan to transition to PO apixaban today. She has finished the loading phase with SQ lovenox.   Hgb 8-9s, plt wnl  Goal of Therapy:  Monitor platelets by anticoagulation protocol: Yes   Plan:  Dc lovenox Apixaban 5mg  PO BID Rx will follow peripherally  Ulyses Southward, PharmD, BCIDP, AAHIVP, CPP Infectious Disease Pharmacist 02/01/2023 10:52 AM

## 2023-02-01 NOTE — Progress Notes (Signed)
PHARMACIST - PHYSICIAN COMMUNICATION  DR:   [name]  CONCERNING: IV to Oral Route Change Policy  RECOMMENDATION: This patient is receiving thiamine/folic acid by the intravenous route.  Based on criteria approved by the Pharmacy and Therapeutics Committee, the intravenous medication(s) is/are being converted to the equivalent oral dose form(s).   DESCRIPTION: These criteria include: The patient is eating (either orally or via tube) and/or has been taking other orally administered medications for a least 24 hours The patient has no evidence of active gastrointestinal bleeding or impaired GI absorption (gastrectomy, short bowel, patient on TNA or NPO).  If you have questions about this conversion, please contact the Pharmacy Department  []   [phone] )  [facility] []   [phone] )  [facility] [x]   [phone] )  [facility] []   [phone] )  [facility] []   [phone] )  [facility]

## 2023-02-01 NOTE — Plan of Care (Signed)
1130: RN stopped tube feedings and removed pt coretrak per MD order and by request of Dr.Chen MD.

## 2023-02-01 NOTE — Progress Notes (Signed)
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    GOAL:   Patient will meet greater than or equal to 90% of their needs    MONITOR:   PO intake, Labs, Weight trends, Diet advancement, TF tolerance  REASON FOR ASSESSMENT:   Other (Comment) (Cortrak list)    ASSESSMENT:   Pt admitted with AMS and HTN. PMH significant for moderate dementia. Patient very confused at time of visit. RN reports poor intake. EN feeding discontinued today.  MD states possible cortrak removal pending families goal of care for patient.  RD asked favorite flavor and pt was unable to answer. Noted weight loss.      NUTRITION - FOCUSED PHYSICAL EXAM:  Flowsheet Row Most Recent Value  Orbital Region Moderate depletion  Upper Arm Region Severe depletion  Thoracic and Lumbar Region Severe depletion  Buccal Region Severe depletion  Temple Region Moderate depletion  Clavicle Bone Region Severe depletion  Clavicle and Acromion Bone Region Severe depletion  Scapular Bone Region Severe depletion  Dorsal Hand Unable to assess  [in mits]  Patellar Region Moderate depletion  Anterior Thigh Region Moderate depletion  Posterior Calf Region Moderate depletion  Edema (RD Assessment) None  Hair Reviewed  Eyes Unable to assess  Mouth Unable to assess  Skin Reviewed  Nails Unable to assess       Diet Order:   Diet Order             DIET - DYS 1 Room service appropriate? No; Fluid consistency: Thin  Diet effective now                   EDUCATION NEEDS:   Education needs have been addressed  Skin:  Skin Assessment: Skin Integrity Issues: Skin Integrity Issues:: DTI, Stage II DTI: mid sacrum Stage II: R thigh  Last BM:  10/9 (type  6 large)  Height:   Ht Readings from Last 1 Encounters:  10/08/22 5\' 5"  (1.651 m)    Weight:   Wt Readings from Last 1 Encounters:  01/31/23 50 kg    Ideal Body Weight:     BMI:  Body mass index is 18.34 kg/m.  Estimated Nutritional Needs:   Kcal:  1400-1600  Protein:  70-85g  Fluid:  >/=1.5L    Jamelle Haring RDN, LDN Clinical Dietitian  RDN pager # available on Amion

## 2023-02-01 NOTE — Progress Notes (Addendum)
Palliative Medicine Inpatient Follow Up Note HPI: 77 y.o. female  with past medical history of moderate dementia admitted on 01/18/2023 with AMS and poor PO intake. She was found to have AKI and hypernatremia.   Palliative care has been asked to get involved to further assist with goals of care conversations.   Today's Discussion 02/01/2023  *Please note that this is a verbal dictation therefore any spelling or grammatical errors are due to the "Dragon [employer] One" system interpretation.  Chart reviewed inclusive of vital signs, progress notes, laboratory results, and diagnostic images.   I met at bedside with Voncile this morning. She is not verbal and looks at me. She has mittens on. She does not appear to exemplify non-verbal signs of distress.  I called patients spouse, [name] this morning. We discussed patients present state and the reality that the NGT cannot stay in long term. He shares understanding. We discussed that [name] and his family do not want Maurita to have a long term feeding tube in her stomach. We reviewed that additional conversations are needed to verify where we go from here. [name] shares that he is optimistic Bristyl will be able to eat well on her own. He shares that on the puree diet she has been doing fairly well when fed by himself or his daughter..  From the perspective of care moving forward patients spouse says he cannot care for her now in the setting of her leg swelling from her LLE DVT and her pressure injury.   Dr. [name] was able to speak with patients spouse thereafter. He shared that the [facility] team will be looking into SNF.  Plan to removed NGT today.  ______________________________ Addendum:  I spoke with patients daughter, [name] this afternoon. Offered time and presence to listen to her concerns. She primarily would like to speak to Dr. [name].  Sheree feels there has been great improvement in her mothers condition in the setting of the artificial  nutrition over this last week. She shares that Lyanna is more able to participate in eating and drinking on her own. We discussed that the speech therapist will reach out to her later this afternoon  Sheree also was told by a student nurse that her mothers sacral pressure injury is healing. I offered that this information may be helpful from one of our wound care nurses for who I shared I would consult.   We discussed that [facility] feels there is an element of patients present care whereby the primary team is preparing the patient for discharge. She feels that this creates feelings of anxiety for her father.   Questions and concerns addressed/Palliative Support Provided.   Additional Time: 30  Objective Assessment: Vital Signs Vitals:   02/01/23 0347 02/01/23 0743  BP: (!) 120/55 (!) 129/54  Pulse: (!) 101   Resp: 20 16  Temp: 98.8 F (37.1 C) 97.9 F (36.6 C)  SpO2: 100% 100%    Intake/Output Summary (Last 24 hours) at 02/01/2023 1047 Last data filed at 02/01/2023 0930 Gross per 24 hour  Intake 550 ml  Output --  Net 550 ml   Last Weight  Most recent update: 01/31/2023  6:37 PM    Weight  50 kg (110 lb 3.7 oz)            Gen:  Frail Elderly AA F chronically ill in appearance HEENT: Coretrack in, Dry mucous membranes CV: Regular rate and rhythm  PULM:  On RA, breathing is even and nonlabored ABD: soft/nontender  EXT: (+) muscle wasting (-) edema  Neuro: Opens eyes though disoriented  SUMMARY OF RECOMMENDATIONS   DNAR/DNI  Plan for Coretrack removal, family does not desire a PEG and feel they will be able to feed Deshanna  Strict delirium precuations  TOC - SNF Placement  Ongoing PMT support   Time - 35 ______________________________________________________________________________________ Lamarr Lulas [facility] Palliative Medicine Team Team Cell Phone: [phone] Please utilize secure chat with additional questions, if there is no response  within 30 minutes please call the above phone number  Palliative Medicine Team providers are available by phone from 7am to 7pm daily and can be reached through the team cell phone.  Should this patient require assistance outside of these hours, please call the patient's attending physician.

## 2023-02-01 NOTE — Evaluation (Signed)
Clinical/Bedside Swallow Evaluation Patient Details  Name: Amanda [name] MRN: [id number] Date of Birth: [birth year]  Today's Date: 02/01/2023 Time: SLP Start Time (ACUTE ONLY): 1350 SLP Stop Time (ACUTE ONLY): 1400 SLP Time Calculation (min) (ACUTE ONLY): 10 min  Past Medical History:  Past Medical History:  Diagnosis Date   Dementia (HCC)    Dyslipidemia    Past Surgical History: History reviewed. No pertinent surgical history. HPI:  Amanda [name] is a 77 yo female presenting to ED 10/1 with worsening AMS and poor PO intake. Admitted with AKI and hypernatremia. Seen by SLP June 2024 with cognitively based dysphagia c/b sustained attention deficits affecting mastication and oral transit. Per notes at that time, pt orally holds foods she does not like and her husband is typically present to order desired food choices and provide careful hand feeding. PMH includes moderate dementia. Cortrak trial began on 10/09 and was discontinued on 10/15.    Assessment / Plan / Recommendation  Clinical Impression  Patient was awake when SLP entered the room. One instance of verbalization, but otherwise she remained nonverbal. No family was present at bedside at this time. Oral cavity appeared clear and moist. She was previously seen by SLP on 10/10 and her clinical presentation appears the same. SLP attempted administration of puree from her lunch tray that had been delivered. She demonstrated refusal of POs, maintaining a tight labial seal. Acceptance of one teaspoon amount of Magic Cup. Swallow was initiated and oral cavity was clear upon inspection. No further POs were accepted. Although patient presents with a cognitively based oral dysphagia, no overt s/sx of aspiration have been observed during this hospitalization. However, patient is safe to remain on current diet of puree and thin liquids, but do not anticipate ability to advance beyond this. SLP will continue to follow from a distance to  ensure no further family education is necessary. SLP Visit Diagnosis: Dysphagia, oral phase (R13.11)    Aspiration Risk  Mild aspiration risk    Diet Recommendation Dysphagia 1 (Puree);Thin liquid    Liquid Administration via: Cup;Straw;Spoon Medication Administration: Crushed with puree Supervision: Full supervision/cueing for compensatory strategies Compensations: Monitor for anterior loss;Slow rate;Small sips/bites;Minimize environmental distractions Postural Changes: Seated upright at 90 degrees;Remain upright for at least 30 minutes after po intake    Other  Recommendations Oral Care Recommendations: Oral care QID;Staff/trained caregiver to provide oral care;Oral care prior to ice chip/H20    Recommendations for follow up therapy are one component of a multi-disciplinary discharge planning process, led by the attending physician.  Recommendations may be updated based on patient status, additional functional criteria and insurance authorization.  Follow up Recommendations Skilled nursing-short term rehab (<3 hours/day)      Assistance Recommended at Discharge    Functional Status Assessment Patient has had a recent decline in their functional status and/or demonstrates limited ability to make significant improvements in function in a reasonable and predictable amount of time  Frequency and Duration Other (Comment) (SLP will follow from a distance for family ed as needed)  1 week       Prognosis Prognosis for improved oropharyngeal function: Guarded Barriers to Reach Goals: Cognitive deficits;Time post onset;Severity of deficits      Swallow Study   General Date of Onset: 01/18/23 HPI: Amanda [name] is a 77 yo female presenting to ED 10/1 with worsening AMS and poor PO intake. Admitted with AKI and hypernatremia. Seen by SLP June 2024 with cognitively based dysphagia c/b sustained attention deficits affecting  mastication and oral transit. Per notes at that time, pt orally  holds foods she does not like and her husband is typically present to order desired food choices and provide careful hand feeding. PMH includes moderate dementia. Cortrak trial 10 day trial began on 10/09. Cortrak trial discontinued on 10/15. PO intake remains poor on dys 1, thin liquid diet. Type of Study: Bedside Swallow Evaluation Previous Swallow Assessment: 10/04 BSE Diet Prior to this Study: Dysphagia 1 (pureed);Thin liquids (Level 0) Temperature Spikes Noted: No Respiratory Status: Room air History of Recent Intubation: No Behavior/Cognition: Requires cueing;Confused;Alert Oral Cavity Assessment: Within Functional Limits Oral Care Completed by SLP: No Oral Cavity - Dentition: Missing dentition Vision: Functional for self-feeding Self-Feeding Abilities: Total assist Patient Positioning: Upright in bed Baseline Vocal Quality: Normal Volitional Cough: Cognitively unable to elicit Volitional Swallow: Able to elicit    Oral/Motor/Sensory Function Overall Oral Motor/Sensory Function: Within functional limits   Ice Chips Ice chips: Not tested   Thin Liquid Thin Liquid: Not tested    Nectar Thick Nectar Thick Liquid: Not tested   Honey Thick Honey Thick Liquid: Not tested   Puree Puree: Impaired Presentation: Spoon Oral Phase Impairments: Poor awareness of bolus (PO refusal)   Solid     Solid: Not tested      Marline Backbone, B.S., Speech Therapy Student   02/01/2023,3:38 PM

## 2023-02-01 NOTE — Care Management Important Message (Signed)
Important Message  Patient Details  Name: Amanda [name] MRN: [id number] Date of Birth: 22-Apr-1945   Important Message Given:  Yes - Medicare IM     Dorena Bodo 02/01/2023, 9:01 AM

## 2023-02-02 DIAGNOSIS — Z515 Encounter for palliative care: Secondary | ICD-10-CM | POA: Diagnosis not present

## 2023-02-02 DIAGNOSIS — G9341 Metabolic encephalopathy: Secondary | ICD-10-CM | POA: Diagnosis not present

## 2023-02-02 LAB — CBC WITH DIFFERENTIAL/PLATELET
Abs Immature Granulocytes: 0.04 10*3/uL (ref 0.00–0.07)
Basophils Absolute: 0 10*3/uL (ref 0.0–0.1)
Basophils Relative: 0 %
Eosinophils Absolute: 0 10*3/uL (ref 0.0–0.5)
Eosinophils Relative: 0 %
HCT: 26.1 % — ABNORMAL LOW (ref 36.0–46.0)
Hemoglobin: 8.4 g/dL — ABNORMAL LOW (ref 12.0–15.0)
Immature Granulocytes: 1 %
Lymphocytes Relative: 16 %
Lymphs Abs: 1.2 10*3/uL (ref 0.7–4.0)
MCH: 26.1 pg (ref 26.0–34.0)
MCHC: 32.2 g/dL (ref 30.0–36.0)
MCV: 81.1 fL (ref 80.0–100.0)
Monocytes Absolute: 0.5 10*3/uL (ref 0.1–1.0)
Monocytes Relative: 6 %
Neutro Abs: 6 10*3/uL (ref 1.7–7.7)
Neutrophils Relative %: 77 %
Platelets: 294 10*3/uL (ref 150–400)
RBC: 3.22 MIL/uL — ABNORMAL LOW (ref 3.87–5.11)
RDW: 15.8 % — ABNORMAL HIGH (ref 11.5–15.5)
WBC: 7.8 10*3/uL (ref 4.0–10.5)
nRBC: 0 % (ref 0.0–0.2)

## 2023-02-02 LAB — GLUCOSE, CAPILLARY
Glucose-Capillary: 106 mg/dL — ABNORMAL HIGH (ref 70–99)
Glucose-Capillary: 111 mg/dL — ABNORMAL HIGH (ref 70–99)
Glucose-Capillary: 112 mg/dL — ABNORMAL HIGH (ref 70–99)
Glucose-Capillary: 134 mg/dL — ABNORMAL HIGH (ref 70–99)
Glucose-Capillary: 137 mg/dL — ABNORMAL HIGH (ref 70–99)
Glucose-Capillary: 137 mg/dL — ABNORMAL HIGH (ref 70–99)
Glucose-Capillary: 98 mg/dL (ref 70–99)

## 2023-02-02 LAB — COMPREHENSIVE METABOLIC PANEL
ALT: 53 U/L — ABNORMAL HIGH (ref 0–44)
AST: 47 U/L — ABNORMAL HIGH (ref 15–41)
Albumin: 2.1 g/dL — ABNORMAL LOW (ref 3.5–5.0)
Alkaline Phosphatase: 59 U/L (ref 38–126)
Anion gap: 13 (ref 5–15)
BUN: 6 mg/dL — ABNORMAL LOW (ref 8–23)
CO2: 26 mmol/L (ref 22–32)
Calcium: 8.9 mg/dL (ref 8.9–10.3)
Chloride: 99 mmol/L (ref 98–111)
Creatinine, Ser: 0.51 mg/dL (ref 0.44–1.00)
GFR, Estimated: >60 mL/min (ref >60)
Glucose, Bld: 113 mg/dL — ABNORMAL HIGH (ref 70–99)
Potassium: 4.1 mmol/L (ref 3.5–5.1)
Sodium: 138 mmol/L (ref 135–145)
Total Bilirubin: 0.4 mg/dL (ref 0.3–1.2)
Total Protein: 6.3 g/dL — ABNORMAL LOW (ref 6.5–8.1)

## 2023-02-02 MED ORDER — FOLIC ACID 1 MG PO TABS
1.0000 mg | ORAL_TABLET | Freq: Every day | ORAL | Status: DC
  Administered 2023-02-02 – 2023-03-02 (×25): 1 mg via ORAL
  Filled 2023-02-02 (×28): qty 1

## 2023-02-02 NOTE — Evaluation (Signed)
Physical Therapy Evaluation Patient Details Name: Amanda [name] MRN: [id number] DOB: June 13, 1945 Today's Date: 02/02/2023  History of Present Illness  Pt is a 77 year old woman admitted on 10/1 with AMS, poor PO intake, SIRS, hypernatremia, AKI secondary to dehydration. Pt + for DVT. Required cortrak due to dysphagia, now discontinued. PMH: dementia, sacral wound, PNA and UTI in June 2024.  Clinical Impression  Pt admitted with above diagnosis and presents to PT with functional limitations due to deficits listed below (See PT problem list). Pt needs skilled PT to maximize independence and safety. Pt was transferring to transport chair with husband at home. Currently unable to stand with 2 person assist. Patient will benefit from continued inpatient follow up therapy, <3 hours/day if she is able to maintain adequate intake.            If plan is discharge home, recommend the following: Two people to help with walking and/or transfers;Two people to help with bathing/dressing/bathroom;Assistance with feeding;Assistance with cooking/housework   Can travel by private vehicle   No    Equipment Recommendations None recommended by PT  Recommendations for Other Services       Functional Status Assessment Patient has had a recent decline in their functional status and/or demonstrates limited ability to make significant improvements in function in a reasonable and predictable amount of time     Precautions / Restrictions Precautions Precautions: Fall Restrictions Weight Bearing Restrictions: No      Mobility  Bed Mobility Overal bed mobility: Needs Assistance Bed Mobility: Sidelying to Sit, Sit to Sidelying   Sidelying to sit: +2 for physical assistance, Total assist     Sit to sidelying: +2 for physical assistance, Total assist General bed mobility comments: assist for all aspects    Transfers                   General transfer comment: unable to stand with +2  assist    Ambulation/Gait                  Stairs            Wheelchair Mobility     Tilt Bed    Modified Rankin (Stroke Patients Only)       Balance Overall balance assessment: Needs assistance Sitting-balance support: Feet unsupported Sitting balance-Leahy Scale: Fair Sitting balance - Comments: CGA statically                                     Pertinent Vitals/Pain Pain Assessment Pain Assessment: Faces Faces Pain Scale: Hurts a little bit Pain Location: generalized Pain Descriptors / Indicators: Moaning Pain Intervention(s): Monitored during session    Home Living Family/patient expects to be discharged to:: Skilled nursing facility Living Arrangements: Spouse/significant other Available Help at Discharge: Family;Available 24 hours/day (husband can provide limited care)             Home Equipment: Wheelchair - manual      Prior Function Prior Level of Function : Needs assist       Physical Assist : Mobility (physical) Mobility (physical): Bed mobility;Transfers   Mobility Comments: per husband, pt was helping to transfer into her w/c at home, when in rehab over the summer, pt could walk with RW, assist and chair follow ADLs Comments: Pt self fed with fingers and utensils, she helped with some aspects of dressing, otherwise dependent in ADLs and IADLs.  Extremity/Trunk Assessment   Upper Extremity Assessment Upper Extremity Assessment: Defer to OT evaluation    Lower Extremity Assessment Lower Extremity Assessment: Generalized weakness    Cervical / Trunk Assessment Cervical / Trunk Assessment: Other exceptions (forward/flexed neck)  Communication      Cognition Arousal: Alert Behavior During Therapy: Flat affect (smiled at husband x 1) Overall Cognitive Status: History of cognitive impairments - at baseline                                 General Comments: pt non verbal, not following  commands, responds to her name by looking up and stating,  "Huh?"        General Comments      Exercises     Assessment/Plan    PT Assessment Patient needs continued PT services  PT Problem List Decreased strength;Decreased activity tolerance;Decreased balance;Decreased mobility       PT Treatment Interventions DME instruction;Functional mobility training;Therapeutic activities;Therapeutic exercise;Balance training;Patient/family education    PT Goals (Current goals can be found in the Care Plan section)  Acute Rehab PT Goals Patient Stated Goal: Pt unable to state PT Goal Formulation: With family Time For Goal Achievement: 02/16/23 Potential to Achieve Goals: Fair    Frequency Min 1X/week     Co-evaluation PT/OT/SLP Co-Evaluation/Treatment: Yes Reason for Co-Treatment: For patient/therapist safety PT goals addressed during session: Mobility/safety with mobility;Balance OT goals addressed during session: ADL's and self-care       AM-PAC PT "6 Clicks" Mobility  Outcome Measure Help needed turning from your back to your side while in a flat bed without using bedrails?: Total Help needed moving from lying on your back to sitting on the side of a flat bed without using bedrails?: Total Help needed moving to and from a bed to a chair (including a wheelchair)?: Total Help needed standing up from a chair using your arms (e.g., wheelchair or bedside chair)?: Total Help needed to walk in hospital room?: Total Help needed climbing 3-5 steps with a railing? : Total 6 Click Score: 6    End of Session   Activity Tolerance: Patient tolerated treatment well Patient left: in bed;with call bell/phone within reach;with family/visitor present   PT Visit Diagnosis: Other abnormalities of gait and mobility (R26.89);Muscle weakness (generalized) (M62.81)    Time: 7846-9629 PT Time Calculation (min) (ACUTE ONLY): 25 min   Charges:   PT Evaluation $PT Eval Moderate Complexity: 1  Mod   PT General Charges $$ ACUTE PT VISIT: 1 Visit         [facility] PT Acute Rehabilitation Services Office [phone]   Angelina Ok [facility] 02/02/2023, 4:07 PM

## 2023-02-02 NOTE — Progress Notes (Addendum)
PROGRESS NOTE    Amanda [name]  WJX:[id number] DOB: [birth year] DOA: 01/18/2023 PCP: [name], MD  Chief Complaint  Patient presents with   Altered Mental Status   Hypertension    Brief Narrative:   Amanda [name] is Amanda [name] 77 y.o. female with medical history significant of moderate dementia who presents with progressive AMS and poor PO intake.     She was admitted with AKI, hypernatremia, and AMS.  She's now s/p trial of NG tube feeding (10/9-10/15).  Hospital course complicated by refeeding syndrome and diarrhea.  She continues to have delirium with poor PO intake.  Palliative following to discuss goals of care.  Ongoing goals of care conversations.   Assessment & Plan:   Principal Problem:   Acute metabolic encephalopathy Active Problems:   Hypernatremia   SIRS (systemic inflammatory response syndrome) (HCC)   Refeeding syndrome   AKI (acute kidney injury) (HCC)   Decubitus ulcer   Dysphagia   Hypertension   Moderate dementia (HCC)   Acute deep vein thrombosis (DVT) of left lower extremity (HCC)   DNR (do not resuscitate)/DNI(Do Not Intubate)   Hypokalemia   Hypomagnesemia   Protein-calorie malnutrition, severe  Goals of Care Patient remains critically ill with persistent encephalopathy. Her overall prognosis is poor.  With improvement in her creatinine/bun, her mental status has not significantly improved from my standpoint.  Now s/p tube feeding from 10/9-10/15.  Family notes "great improvement" since last week.  Notes she was "very alert" last Friday.  "Impressed" by how much food she was willing to eat.  Discussed today with daughter that though she may be taking PO, I think based on the chart review and current exam her PO intake likely inadequate to meet her needs - I agree with Dr. [name] that hospice is [facility] V[facility] reasonable option.  Will watch how she does off IVF.  Palliative continuing to follow and assist with GOC conversations.    Acute metabolic  encephalopathy Dementia  Dementia for years.  at baseline, wheelchair bound (walked 80 ft with walker with assistance in early September at rehab - wheelchair bound since being home), she's disoriented typically, but will communicate.   Likely multifactorial (hypernatremia, uremia/AKI, dementia) but inciting cause unclear.   No clear infectious cause (CXR without active disease, CT maxillofacial without notable findings, CT abd/pelvis without acute findings, decub doesn't appear infected) TSH wnl, ammonia wnl  B12 (elevated), folate low -> supplement EEG with moderate to severe diffuse encephalopathy, no seizures or epileptiform discharges CT head without acute abnormality MRI brain without acute abnormality  Treating hypernatremia/AKI (significantly improved) Consider additional workup/imaging - she remains altered, but is very gradually improving - seems like she is still far from her previous baseline, not sure she'll improve to that point again    Refeeding Syndrome Diarrhea improved Will continue to monitor with her PO intake  She removed rectal tube overnight   SIRS (systemic inflammatory response syndrome) (HCC) Initial tachycardia, WBC 17k.  Question of SIRS / Sepsis with unclear source Sepsis ruled out.  UA neg No obvious dental infection on my exam Decubitus ulcer is at least stage 2, but no obvious erythema / drainage CT maxilofacial to r/o dental abscess -> without acute findings CT abd pelvis with large stool burden, air within bladder Repeat CXR 10/3 -> without acute cardiopulm dz  Blood cultures NGx5 Urine cx no growth  Covid and RVP negative S/p ceftriaxone empirically (7 days)    Dysphagia Dysphagia 1, thin liquid per SLP  10/15 S/p NG tube 10/9-10/15    Hypernatremia resolved   AKI  Resolved    Hypoglycemia Monitor off d5 fluids    DVT Extensive --> [name] with DVT involving L common femoral vein, SF junction, L femoral vein, L proximal profunda vein, L  popliteal vein, L posterior tibial veins, and L peroneal veins eliquis    Hypokalemia Hypomagnesemia Replace and follow    Elevated Lipase Unclear significance - CT without findings c/w pancreatitis, not significantly tender on exam Trend -> resolved   Decubitus ulcer Wound care consult -> appreciate recs Will discuss with RN    Hypertension Chart diagnosis, doesn't appear to be on any chronic meds for this. BP appropriate without meds    DVT prophylaxis: eliquis Code Status: DNR Family Communication: none at bedside - daughter over phone 10/16 Disposition:   Status is: Inpatient Remains inpatient appropriate because: need for continued inpatient care   Consultants:  palliative  Procedures:  10/4 IMPRESSION: This study is suggestive of moderate to severe diffuse encephalopathy. No seizures or epileptiform discharges were seen throughout the recording.  10/9  Cortrak    Antimicrobials:  Anti-infectives (From admission, onward)    Start     Dose/Rate Route Frequency Ordered Stop   01/19/23 1900  ceFEPIme (MAXIPIME) 2 g in sodium chloride 0.9 % 100 mL IVPB  Status:  Discontinued        2 g 200 mL/hr over 30 Minutes Intravenous Every 24 hours 01/19/23 0338 01/19/23 0945   01/19/23 1000  cefTRIAXone (ROCEPHIN) 2 g in sodium chloride 0.9 % 100 mL IVPB       Note to Pharmacy: Retime as indicated   2 g 200 mL/hr over 30 Minutes Intravenous Every 24 hours 01/19/23 0945 01/25/23 1325   01/19/23 0342  vancomycin variable dose per unstable renal function (pharmacist dosing)  Status:  Discontinued         Does not apply See admin instructions 01/19/23 0343 01/20/23 0718   01/18/23 1830  vancomycin (VANCOREADY) IVPB 1250 mg/250 mL        1,250 mg 166.7 mL/hr over 90 Minutes Intravenous  Once 01/18/23 1818 01/18/23 2122   01/18/23 1815  ceFEPIme (MAXIPIME) 2 g in sodium chloride 0.9 % 100 mL IVPB        2 g 200 mL/hr over 30 Minutes Intravenous  Once 01/18/23 1807 01/18/23  1911       Subjective: Doesn't say anything RN notes pulled rectal tube overnight  Objective: Vitals:   02/01/23 1945 02/01/23 2204 02/02/23 0357 02/02/23 0738  BP: (!) 89/66 133/65 (!) 112/52 128/69  Pulse: (!) 124 (!) 102 100 (!) 104  Resp: 20 20  17   Temp: 99.2 F (37.3 C)  98.9 F (37.2 C) 98.2 F (36.8 C)  TempSrc: Oral  Oral Oral  SpO2: 100%  100% 100%  Weight:        Intake/Output Summary (Last 24 hours) at 02/02/2023 0814 Last data filed at 02/02/2023 0545 Gross per 24 hour  Intake 2224.84 ml  Output 300 ml  Net 1924.84 ml   Filed Weights   01/31/23 0634 01/31/23 0649 01/31/23 1800  Weight: 61 kg 85 kg 50 kg    Examination:  General exam: cachetic, chronically ill appearing  Respiratory system: unlabored Cardiovascular system: RRR Gastrointestinal system: Abdomen is nondistended, soft and nontender Central nervous system: disoriented - moving all extremities Decubitus deferred Extremities: bilateral LE edema     Data Reviewed: I have personally reviewed following labs and imaging  studies  CBC: Recent Labs  Lab 01/28/23 0554 01/29/23 0430 01/30/23 0543 01/31/23 0820 02/02/23 0453  WBC 10.7* 11.2* 11.5* 7.8 7.8  NEUTROABS  --   --   --   --  6.0  HGB 9.6* 9.8* 9.2* 8.2* 8.4*  HCT 31.0* 30.5* 29.2* 26.0* 26.1*  MCV 81.8 83.8 81.8 80.2 81.1  PLT 283 287 267 238 294    Basic Metabolic Panel: Recent Labs  Lab 01/27/23 0947 01/27/23 1217 01/28/23 0554 01/28/23 0917 01/29/23 0430 01/30/23 0543 01/31/23 0820 02/02/23 0453  NA  --    < > 140  --  138 135 135 138  K  --    < > 3.8  --  4.5 4.9 4.7 4.1  CL  --    < > 107  --  103 101 101 99  CO2  --    < > 25  --  26 25 26 26   GLUCOSE  --    < > 120*  --  112* 154* 131* 113*  BUN  --    < > 5*  --  5* 5* 7* 6*  CREATININE  --    < > 0.60  --  0.51 0.50 0.53 0.51  CALCIUM  --    < > 7.8*  --  8.2* 8.1* 8.1* 8.9  MG 1.4*  --  2.0  --   --  1.4* 1.4*  --   PHOS 2.3*  --   --  2.0*  --    --   --   --    < > = values in this interval not displayed.    GFR: Estimated Creatinine Clearance: 46.5 mL/min (by C-G formula based on SCr of 0.51 mg/dL).  Liver Function Tests: Recent Labs  Lab 01/28/23 0554 01/29/23 0430 01/30/23 0543 01/31/23 0820 02/02/23 0453  AST 41 32 29 31 47*  ALT 50* 45* 35 33 53*  ALKPHOS 76 73 67 58 59  BILITOT 0.4 0.3 0.4 0.2* 0.4  PROT 6.0* 6.8 6.3* 6.1* 6.3*  ALBUMIN 2.1* 2.3* 2.1* 2.0* 2.1*    CBG: Recent Labs  Lab 02/01/23 1751 02/01/23 2012 02/02/23 0104 02/02/23 0531 02/02/23 0737  GLUCAP 95 119* 137* 137* 112*     No results found for this or any previous visit (from the past 240 hour(s)).       Radiology Studies: No results found.      Scheduled Meds:  apixaban  5 mg Oral BID   feeding supplement  237 mL Oral BID BM   leptospermum manuka honey  1 Application Topical Daily   thiamine (VITAMIN B1) injection  100 mg Intravenous Daily   Continuous Infusions:  dextrose 5% lactated ringers 75 mL/hr at 02/02/23 0545     LOS: 15 days    Time spent: over 30 min  35 minutes with daughter over phone in addition to in person time with patient and reviewing chart     Lacretia Nicks, MD [facility]   To contact the attending provider between 7A-7P or the covering provider during after hours 7P-7A, please log into the web site [email or web address] and access using universal [place] password for that web site. If you do not have the password, please call the hospital operator.  02/02/2023, 8:14 AM

## 2023-02-02 NOTE — Progress Notes (Addendum)
   Palliative Medicine Inpatient Follow Up Note HPI: 77 y.o. female  with past medical history of moderate dementia admitted on 01/18/2023 with AMS and poor PO intake. She was found to have AKI and hypernatremia.   Palliative care has been asked to get involved to further assist with goals of care conversations.   Today's Discussion 02/02/2023  *Please note that this is a verbal dictation therefore any spelling or grammatical errors are due to the "Dragon Medical One" system interpretation.  Chart reviewed inclusive of vital signs, progress notes, laboratory results, and diagnostic images.   I met at bedside with Amanda [name] this morning. She is resting without nonverbal indicators of discomfort.   Patients spouse, Amanda [name] called this morning. Per Dr. Lowell Guitar plan for 48 hours to see if patient can sustain hydration/nutrition and if not additional conversations are tp be held in regards to next steps.  Amanda [name] and his daughter are able to meet on Friday at [facility]   Objective Assessment: Vital Signs Vitals:   02/02/23 0357 02/02/23 0738  BP: (!) 112/52 128/69  Pulse: 100 (!) 104  Resp:  17  Temp: 98.9 F (37.2 C) 98.2 F (36.8 C)  SpO2: 100% 100%    Intake/Output Summary (Last 24 hours) at 02/02/2023 1155 Last data filed at 02/02/2023 1004 Gross per 24 hour  Intake 2571.95 ml  Output 300 ml  Net 2271.95 ml   Last Weight  Most recent update: 01/31/2023  6:37 PM    Weight  50 kg (110 lb 3.7 oz)            Gen:  Frail Elderly AA F chronically ill in appearance HEENT:  Dry mucous membranes CV: Regular rate and rhythm  PULM:  On RA, breathing is even and nonlabored ABD: soft/nontender  EXT: (+) muscle wasting (-) edema  Neuro: Eyes closed, arouses   SUMMARY OF RECOMMENDATIONS   DNAR/DNI  Allowing 48 hours to see if patient is able to sustain nutritionally  If able to sustain nutrition/hydration will go to SNF   If not able to sustain nutrition/hydration additional  conversations related to hospice will be had  Plan for family meeting on Friday at [facility]  Ongoing PMT support   Time - 35 ______________________________________________________________________________________ Lamarr Lulas [place] Palliative Medicine Team Team Cell Phone: [phone] Please utilize secure chat with additional questions, if there is no response within 30 minutes please call the above phone number  Palliative Medicine Team providers are available by phone from 7am to 7pm daily and can be reached through the team cell phone.  Should this patient require assistance outside of these hours, please call the patient's attending physician.

## 2023-02-02 NOTE — Plan of Care (Signed)
  Problem: Metabolic: Goal: Ability to maintain appropriate glucose levels will improve Outcome: Progressing   Problem: Skin Integrity: Goal: Risk for impaired skin integrity will decrease Outcome: Progressing   Problem: Elimination: Goal: Will not experience complications related to bowel motility Outcome: Progressing Goal: Will not experience complications related to urinary retention Outcome: Progressing   Problem: Pain Managment: Goal: General experience of comfort will improve Outcome: Progressing   Problem: Safety: Goal: Ability to remain free from injury will improve Outcome: Progressing   Problem: Skin Integrity: Goal: Risk for impaired skin integrity will decrease Outcome: Progressing

## 2023-02-02 NOTE — Evaluation (Signed)
Occupational Therapy Evaluation Patient Details Name: Amanda [name] MRN: [id number] DOB: [birth year] Today's Date: 02/02/2023   History of Present Illness Pt is a 77 year old woman admitted on 10/1 with AMS, poor PO intake, SIRS, hypernatremia, AKI secondary to dehydration. Pt + for DVT. Required cortrak due to dysphagia, now discontinued. PMH: dementia, sacral wound, PNA and UTI in June 2024.   Clinical Impression   Pt was assisted to transfer in and out of her w/c at home, was able to self feed with her fingers and sometimes a utensil and participate in dressing. Pt awake this session. Husband and daughter at bedside. Pt requires +2 total assist for bed mobility and demonstrates fair sitting balance with forward/flexed neck at EOB. She is unable to stand. Pt is dependent in all ADLs. She appeared to recognize her family. Patient will benefit from continued inpatient follow up therapy, <3 hours/day.       If plan is discharge home, recommend the following: Two people to help with walking and/or transfers;Two people to help with bathing/dressing/bathroom;Assistance with cooking/housework;Assistance with feeding;Direct supervision/assist for medications management;Direct supervision/assist for financial management;Assist for transportation;Help with stairs or ramp for entrance    Functional Status Assessment  Patient has had a recent decline in their functional status and/or demonstrates limited ability to make significant improvements in function in a reasonable and predictable amount of time  Equipment Recommendations  Hoyer lift;Hospital bed    Recommendations for Other Services       Precautions / Restrictions Precautions Precautions: Fall Restrictions Weight Bearing Restrictions: No      Mobility Bed Mobility Overal bed mobility: Needs Assistance Bed Mobility: Sidelying to Sit, Sit to Sidelying   Sidelying to sit: +2 for physical assistance, Total assist     Sit to  sidelying: +2 for physical assistance, Total assist General bed mobility comments: assist for all aspects    Transfers                   General transfer comment: unable to stand      Balance Overall balance assessment: Needs assistance Sitting-balance support: Feet unsupported Sitting balance-Leahy Scale: Fair Sitting balance - Comments: CGA statically     Standing balance-Leahy Scale: Zero                             ADL either performed or assessed with clinical judgement   ADL                                         General ADL Comments: dependent, did not hold Ensure carton to drink     Vision Patient Visual Report: No change from baseline Additional Comments: looks toward husband and appeared to recognize     Perception         Praxis         Pertinent Vitals/Pain Pain Assessment Pain Assessment: Faces Faces Pain Scale: Hurts a little bit Pain Location: generalized Pain Descriptors / Indicators: Moaning Pain Intervention(s): Monitored during session, Repositioned     Extremity/Trunk Assessment Upper Extremity Assessment Upper Extremity Assessment: Generalized weakness (maintains hands flexed, but demonstrates full PROM, functional use of R hand to pull at IV line, mitt replaced)   Lower Extremity Assessment Lower Extremity Assessment: Defer to PT evaluation   Cervical / Trunk Assessment Cervical / Trunk Assessment: Other exceptions (  forward/flexed neck)   Communication     Cognition Arousal: Alert Behavior During Therapy: Flat affect (smiled at husband x 1) Overall Cognitive Status: History of cognitive impairments - at baseline                                 General Comments: pt non verbal, not following commands, responds to her name by looking up and stating,  "Huh?"     General Comments       Exercises     Shoulder Instructions      Home Living Family/patient expects to be  discharged to:: Skilled nursing facility Living Arrangements: Spouse/significant other Available Help at Discharge: Family;Available 24 hours/day (husband can provide limited care)                         Home Equipment: Wheelchair - manual          Prior Functioning/Environment Prior Level of Function : Needs assist             Mobility Comments: per husband, pt was helping to transfer into her w/c at home, when in rehab over the summer, pt could walk with RW, assist and chair follow ADLs Comments: Pt self fed with fingers and utensils, she helped with some aspects of dressing, otherwise dependent in ADLs and IADLs.        OT Problem List: Decreased strength;Decreased coordination;Decreased activity tolerance;Decreased cognition;Impaired UE functional use      OT Treatment/Interventions: Self-care/ADL training    OT Goals(Current goals can be found in the care plan section) Acute Rehab OT Goals OT Goal Formulation: With family Time For Goal Achievement: 02/16/23 Potential to Achieve Goals: Fair  OT Frequency: Min 1X/week    Co-evaluation PT/OT/SLP Co-Evaluation/Treatment: Yes Reason for Co-Treatment: For patient/therapist safety   OT goals addressed during session: ADL's and self-care      AM-PAC OT "6 Clicks" Daily Activity     Outcome Measure Help from another person eating meals?: Total Help from another person taking care of personal grooming?: Total Help from another person toileting, which includes using toliet, bedpan, or urinal?: Total Help from another person bathing (including washing, rinsing, drying)?: Total Help from another person to put on and taking off regular upper body clothing?: Total Help from another person to put on and taking off regular lower body clothing?: Total 6 Click Score: 6   End of Session    Activity Tolerance: Patient tolerated treatment well Patient left: in bed;with call bell/phone within reach;with family/visitor  present  OT Visit Diagnosis: Muscle weakness (generalized) (M62.81);Cognitive communication deficit (R41.841)                Time: 1610-9604 OT Time Calculation (min): 24 min Charges:  OT General Charges $OT Visit: 1 Visit OT Evaluation $OT Eval Moderate Complexity: 1 Mod  [name], OTR/L Acute Rehabilitation Services Office: [phone]   [name] 02/02/2023, 3:53 PM

## 2023-02-03 DIAGNOSIS — G9341 Metabolic encephalopathy: Secondary | ICD-10-CM | POA: Diagnosis not present

## 2023-02-03 LAB — COMPREHENSIVE METABOLIC PANEL
ALT: 49 U/L — ABNORMAL HIGH (ref 0–44)
AST: 42 U/L — ABNORMAL HIGH (ref 15–41)
Albumin: 2.2 g/dL — ABNORMAL LOW (ref 3.5–5.0)
Alkaline Phosphatase: 53 U/L (ref 38–126)
Anion gap: 8 (ref 5–15)
BUN: 7 mg/dL — ABNORMAL LOW (ref 8–23)
CO2: 26 mmol/L (ref 22–32)
Calcium: 8.6 mg/dL — ABNORMAL LOW (ref 8.9–10.3)
Chloride: 104 mmol/L (ref 98–111)
Creatinine, Ser: 0.58 mg/dL (ref 0.44–1.00)
GFR, Estimated: >60 mL/min (ref >60)
Glucose, Bld: 92 mg/dL (ref 70–99)
Potassium: 4.1 mmol/L (ref 3.5–5.1)
Sodium: 138 mmol/L (ref 135–145)
Total Bilirubin: <0.1 mg/dL — ABNORMAL LOW (ref 0.3–1.2)
Total Protein: 6.6 g/dL (ref 6.5–8.1)

## 2023-02-03 LAB — CBC WITH DIFFERENTIAL/PLATELET
Abs Immature Granulocytes: 0.04 10*3/uL (ref 0.00–0.07)
Basophils Absolute: 0 10*3/uL (ref 0.0–0.1)
Basophils Relative: 0 %
Eosinophils Absolute: 0 10*3/uL (ref 0.0–0.5)
Eosinophils Relative: 1 %
HCT: 28.6 % — ABNORMAL LOW (ref 36.0–46.0)
Hemoglobin: 9 g/dL — ABNORMAL LOW (ref 12.0–15.0)
Immature Granulocytes: 1 %
Lymphocytes Relative: 18 %
Lymphs Abs: 1 10*3/uL (ref 0.7–4.0)
MCH: 25.9 pg — ABNORMAL LOW (ref 26.0–34.0)
MCHC: 31.5 g/dL (ref 30.0–36.0)
MCV: 82.4 fL (ref 80.0–100.0)
Monocytes Absolute: 0.5 10*3/uL (ref 0.1–1.0)
Monocytes Relative: 9 %
Neutro Abs: 3.8 10*3/uL (ref 1.7–7.7)
Neutrophils Relative %: 71 %
Platelets: 257 10*3/uL (ref 150–400)
RBC: 3.47 MIL/uL — ABNORMAL LOW (ref 3.87–5.11)
RDW: 15.8 % — ABNORMAL HIGH (ref 11.5–15.5)
WBC: 5.3 10*3/uL (ref 4.0–10.5)
nRBC: 0 % (ref 0.0–0.2)

## 2023-02-03 LAB — GLUCOSE, CAPILLARY
Glucose-Capillary: 64 mg/dL — ABNORMAL LOW (ref 70–99)
Glucose-Capillary: 67 mg/dL — ABNORMAL LOW (ref 70–99)
Glucose-Capillary: 86 mg/dL (ref 70–99)
Glucose-Capillary: 88 mg/dL (ref 70–99)
Glucose-Capillary: 96 mg/dL (ref 70–99)
Glucose-Capillary: 98 mg/dL (ref 70–99)

## 2023-02-03 LAB — PHOSPHORUS: Phosphorus: 3.7 mg/dL (ref 2.5–4.6)

## 2023-02-03 LAB — MAGNESIUM: Magnesium: 1.8 mg/dL (ref 1.7–2.4)

## 2023-02-03 MED ORDER — DEXTROSE 50 % IV SOLN
1.0000 | Freq: Once | INTRAVENOUS | Status: AC
  Administered 2023-02-03: 50 mL via INTRAVENOUS
  Filled 2023-02-03: qty 50

## 2023-02-03 NOTE — Plan of Care (Signed)
  Problem: Skin Integrity: Goal: Risk for impaired skin integrity will decrease Outcome: Progressing   Problem: Coping: Goal: Level of anxiety will decrease Outcome: Progressing   Problem: Elimination: Goal: Will not experience complications related to bowel motility Outcome: Progressing Goal: Will not experience complications related to urinary retention Outcome: Progressing   Problem: Pain Managment: Goal: General experience of comfort will improve Outcome: Progressing   Problem: Safety: Goal: Ability to remain free from injury will improve Outcome: Progressing   Problem: Skin Integrity: Goal: Risk for impaired skin integrity will decrease Outcome: Progressing

## 2023-02-03 NOTE — Progress Notes (Signed)
PROGRESS NOTE    Amanda [name]  Amanda [name] DOB: Apr 27, 1945 DOA: 01/18/2023 PCP: [name], MD  Chief Complaint  Patient presents with   Altered Mental Status   Hypertension    Brief Narrative:   Amanda [name] is Amanda [name] 77 y.o. female with medical history significant of moderate dementia who presents with progressive AMS and poor PO intake.     She was admitted with AKI, hypernatremia, and AMS.  She's now s/p trial of NG tube feeding (10/9-10/15).  Hospital course complicated by refeeding syndrome and diarrhea.  She continues to have delirium with poor PO intake.  Palliative following to discuss goals of care.  Ongoing goals of care conversations.   Assessment & Plan:   Principal Problem:   Acute metabolic encephalopathy Active Problems:   Hypernatremia   SIRS (systemic inflammatory response syndrome) (HCC)   Refeeding syndrome   AKI (acute kidney injury) (HCC)   Decubitus ulcer   Dysphagia   Hypertension   Moderate dementia (HCC)   Acute deep vein thrombosis (DVT) of left lower extremity (HCC)   DNR (do not resuscitate)/DNI(Do Not Intubate)   Hypokalemia   Hypomagnesemia   Protein-calorie malnutrition, severe  Goals of Care Patient remains critically ill with persistent encephalopathy. Her overall prognosis is poor.  With improvement in her creatinine/bun, her mental status has not significantly improved from my standpoint.  Now s/p tube feeding from 10/9-10/15.  On 10/16 discussed with daughter, Mrs. Amanda [name].  She notes "great improvement" since last week.  Notes she was "very alert" last Friday.  "Impressed" by how much food she was willing to eat.  Discussed with daughter that though she may be taking PO, I think based on the chart review and current exam her PO intake likely inadequate to meet her needs - I agree with Dr. [name] that hospice is Amanda [name] reasonable option.  Will watch how she does off IVF.  Palliative continuing to follow and assist with GOC  conversations.    Acute metabolic encephalopathy Dementia  Dementia for years.  at baseline, wheelchair bound (walked 80 ft with walker with assistance in early September at rehab - wheelchair bound since being home), she's disoriented typically, but will communicate.   Likely multifactorial (hypernatremia, uremia/AKI, dementia) but inciting cause unclear.   No clear infectious cause (CXR without active disease, CT maxillofacial without notable findings, CT abd/pelvis without acute findings, decub doesn't appear infected) TSH wnl, ammonia wnl  B12 (elevated), folate low -> supplement EEG with moderate to severe diffuse encephalopathy, no seizures or epileptiform discharges CT head without acute abnormality MRI brain without acute abnormality  Treating hypernatremia/AKI (significantly improved) Consider additional workup/imaging - she remains altered, but is very gradually improving - seems like she is still far from her previous baseline, not sure she'll improve to that point again    Refeeding Syndrome Diarrhea improved Will continue to monitor with her PO intake  She removed rectal tube overnight   SIRS (systemic inflammatory response syndrome) (HCC) Initial tachycardia, WBC 17k.  Question of SIRS / Sepsis with unclear source Sepsis ruled out.  UA neg No obvious dental infection on my exam Decubitus ulcer is at least stage 2, but no obvious erythema / drainage CT maxilofacial to r/o dental abscess -> without acute findings CT abd pelvis with large stool burden, air within bladder Repeat CXR 10/3 -> without acute cardiopulm dz  Blood cultures NGx5 Urine cx no growth  Covid and RVP negative S/p ceftriaxone empirically (7 days)  Dysphagia Dysphagia 1, thin liquid per SLP 10/15 S/p NG tube 10/9-10/15  Monitor PO intake   Hypernatremia resolved   AKI  Resolved    Hypoglycemia Monitor off d5 fluids    DVT Extensive --> [name] with DVT involving L common femoral vein, SF  junction, L femoral vein, L proximal profunda vein, L popliteal vein, L posterior tibial veins, and L peroneal veins eliquis    Hypokalemia Hypomagnesemia Replace and follow    Elevated Lipase Unclear significance - CT without findings c/w pancreatitis, not significantly tender on exam Trend -> resolved   Decubitus ulcer Wound care consult -> appreciate recs   Hypertension Chart diagnosis, doesn't appear to be on any chronic meds for this. BP appropriate without meds    DVT prophylaxis: eliquis Code Status: DNR Family Communication: none at bedside - daughter over phone 10/16 Disposition:   Status is: Inpatient Remains inpatient appropriate because: need for continued inpatient care   Consultants:  palliative  Procedures:  10/4 IMPRESSION: This study is suggestive of moderate to severe diffuse encephalopathy. No seizures or epileptiform discharges were seen throughout the recording.  10/9  Cortrak    Antimicrobials:  Anti-infectives (From admission, onward)    Start     Dose/Rate Route Frequency Ordered Stop   01/19/23 1900  ceFEPIme (MAXIPIME) 2 g in sodium chloride 0.9 % 100 mL IVPB  Status:  Discontinued        2 g 200 mL/hr over 30 Minutes Intravenous Every 24 hours 01/19/23 0338 01/19/23 0945   01/19/23 1000  cefTRIAXone (ROCEPHIN) 2 g in sodium chloride 0.9 % 100 mL IVPB       Note to Pharmacy: Retime as indicated   2 g 200 mL/hr over 30 Minutes Intravenous Every 24 hours 01/19/23 0945 01/25/23 1325   01/19/23 0342  vancomycin variable dose per unstable renal function (pharmacist dosing)  Status:  Discontinued         Does not apply See admin instructions 01/19/23 0343 01/20/23 0718   01/18/23 1830  vancomycin (VANCOREADY) IVPB 1250 mg/250 mL        1,250 mg 166.7 mL/hr over 90 Minutes Intravenous  Once 01/18/23 1818 01/18/23 2122   01/18/23 1815  ceFEPIme (MAXIPIME) 2 g in sodium chloride 0.9 % 100 mL IVPB        2 g 200 mL/hr over 30 Minutes  Intravenous  Once 01/18/23 1807 01/18/23 1911       Subjective: Doesn't say anything today  Objective: Vitals:   02/02/23 2007 02/03/23 0429 02/03/23 0500 02/03/23 0736  BP: (!) 121/40 (!) 103/39  (!) 102/49  Pulse: (!) 108 97  93  Resp: 18 18  17   Temp: 98 F (36.7 C) 98.2 F (36.8 C)  97.8 F (36.6 C)  TempSrc: Oral Oral  Oral  SpO2: 100% 100%  100%  Weight:   50 kg     Intake/Output Summary (Last 24 hours) at 02/03/2023 1302 Last data filed at 02/03/2023 0900 Gross per 24 hour  Intake 340 ml  Output --  Net 340 ml   Filed Weights   01/31/23 0649 01/31/23 1800 02/03/23 0500  Weight: 85 kg 50 kg 50 kg    Examination:  General: chronically ill appearing Lungs: unlabored Neurological: doesn't say much today, seems less interactive Skin: decubitus with unstageable areas  Extremities: LE edema    Data Reviewed: I have personally reviewed following labs and imaging studies  CBC: Recent Labs  Lab 01/29/23 0430 01/30/23 0543 01/31/23 0820 02/02/23  0981 02/03/23 0552  WBC 11.2* 11.5* 7.8 7.8 5.3  NEUTROABS  --   --   --  6.0 3.8  HGB 9.8* 9.2* 8.2* 8.4* 9.0*  HCT 30.5* 29.2* 26.0* 26.1* 28.6*  MCV 83.8 81.8 80.2 81.1 82.4  PLT 287 267 238 294 257    Basic Metabolic Panel: Recent Labs  Lab 01/28/23 0554 01/28/23 0917 01/29/23 0430 01/30/23 0543 01/31/23 0820 02/02/23 0453 02/03/23 0552  NA 140  --  138 135 135 138 138  K 3.8  --  4.5 4.9 4.7 4.1 4.1  CL 107  --  103 101 101 99 104  CO2 25  --  26 25 26 26 26   GLUCOSE 120*  --  112* 154* 131* 113* 92  BUN 5*  --  5* 5* 7* 6* 7*  CREATININE 0.60  --  0.51 0.50 0.53 0.51 0.58  CALCIUM 7.8*  --  8.2* 8.1* 8.1* 8.9 8.6*  MG 2.0  --   --  1.4* 1.4*  --  1.8  PHOS  --  2.0*  --   --   --   --  3.7    GFR: Estimated Creatinine Clearance: 46.5 mL/min (by C-G formula based on SCr of 0.58 mg/dL).  Liver Function Tests: Recent Labs  Lab 01/29/23 0430 01/30/23 0543 01/31/23 0820 02/02/23 0453  02/03/23 0552  AST 32 29 31 47* 42*  ALT 45* 35 33 53* 49*  ALKPHOS 73 67 58 59 53  BILITOT 0.3 0.4 0.2* 0.4 <0.1*  PROT 6.8 6.3* 6.1* 6.3* 6.6  ALBUMIN 2.3* 2.1* 2.0* 2.1* 2.2*    CBG: Recent Labs  Lab 02/02/23 2006 02/02/23 2358 02/03/23 0427 02/03/23 0724 02/03/23 1120  GLUCAP 111* 106* 98 96 86     No results found for this or any previous visit (from the past 240 hour(s)).       Radiology Studies: No results found.      Scheduled Meds:  apixaban  5 mg Oral BID   feeding supplement  237 mL Oral BID BM   folic acid  1 mg Oral Daily   leptospermum manuka honey  1 Application Topical Daily   Continuous Infusions:     LOS: 16 days    Time spent: over 30 min     Lacretia Nicks, MD [facility]   To contact the attending provider between 7A-7P or the covering provider during after hours 7P-7A, please log into the web site [email or web address] and access using universal [place] password for that web site. If you do not have the password, please call the hospital operator.  02/03/2023, 1:02 PM

## 2023-02-03 NOTE — Progress Notes (Signed)
Notified Dr. Lowell Guitar of pt's CBG 86. Pt unwilling to accept PO from staff. Encouraged pt to open mouth many times. Family not present. MD will be seeing pt soon.

## 2023-02-03 NOTE — Plan of Care (Signed)

## 2023-02-04 DIAGNOSIS — E43 Unspecified severe protein-calorie malnutrition: Secondary | ICD-10-CM | POA: Diagnosis not present

## 2023-02-04 DIAGNOSIS — Z7189 Other specified counseling: Secondary | ICD-10-CM | POA: Diagnosis not present

## 2023-02-04 DIAGNOSIS — G9341 Metabolic encephalopathy: Secondary | ICD-10-CM | POA: Diagnosis not present

## 2023-02-04 DIAGNOSIS — Z515 Encounter for palliative care: Secondary | ICD-10-CM | POA: Diagnosis not present

## 2023-02-04 LAB — COMPREHENSIVE METABOLIC PANEL
ALT: 36 U/L (ref 0–44)
AST: 26 U/L (ref 15–41)
Albumin: 2.4 g/dL — ABNORMAL LOW (ref 3.5–5.0)
Alkaline Phosphatase: 55 U/L (ref 38–126)
Anion gap: 12 (ref 5–15)
BUN: <5 mg/dL — ABNORMAL LOW (ref 8–23)
CO2: 24 mmol/L (ref 22–32)
Calcium: 8.8 mg/dL — ABNORMAL LOW (ref 8.9–10.3)
Chloride: 105 mmol/L (ref 98–111)
Creatinine, Ser: 0.49 mg/dL (ref 0.44–1.00)
GFR, Estimated: >60 mL/min (ref >60)
Glucose, Bld: 80 mg/dL (ref 70–99)
Potassium: 3.6 mmol/L (ref 3.5–5.1)
Sodium: 141 mmol/L (ref 135–145)
Total Bilirubin: 0.7 mg/dL (ref 0.3–1.2)
Total Protein: 6.6 g/dL (ref 6.5–8.1)

## 2023-02-04 LAB — GLUCOSE, CAPILLARY
Glucose-Capillary: 125 mg/dL — ABNORMAL HIGH (ref 70–99)
Glucose-Capillary: 61 mg/dL — ABNORMAL LOW (ref 70–99)
Glucose-Capillary: 66 mg/dL — ABNORMAL LOW (ref 70–99)
Glucose-Capillary: 68 mg/dL — ABNORMAL LOW (ref 70–99)
Glucose-Capillary: 71 mg/dL (ref 70–99)
Glucose-Capillary: 71 mg/dL (ref 70–99)
Glucose-Capillary: 73 mg/dL (ref 70–99)
Glucose-Capillary: 95 mg/dL (ref 70–99)

## 2023-02-04 LAB — PHOSPHORUS: Phosphorus: 3.2 mg/dL (ref 2.5–4.6)

## 2023-02-04 LAB — CBC WITH DIFFERENTIAL/PLATELET
Abs Immature Granulocytes: 0.03 10*3/uL (ref 0.00–0.07)
Basophils Absolute: 0 10*3/uL (ref 0.0–0.1)
Basophils Relative: 0 %
Eosinophils Absolute: 0.1 10*3/uL (ref 0.0–0.5)
Eosinophils Relative: 1 %
HCT: 29.3 % — ABNORMAL LOW (ref 36.0–46.0)
Hemoglobin: 9 g/dL — ABNORMAL LOW (ref 12.0–15.0)
Immature Granulocytes: 1 %
Lymphocytes Relative: 17 %
Lymphs Abs: 1 10*3/uL (ref 0.7–4.0)
MCH: 25.6 pg — ABNORMAL LOW (ref 26.0–34.0)
MCHC: 30.7 g/dL (ref 30.0–36.0)
MCV: 83.5 fL (ref 80.0–100.0)
Monocytes Absolute: 0.5 10*3/uL (ref 0.1–1.0)
Monocytes Relative: 8 %
Neutro Abs: 4.1 10*3/uL (ref 1.7–7.7)
Neutrophils Relative %: 73 %
Platelets: 328 10*3/uL (ref 150–400)
RBC: 3.51 MIL/uL — ABNORMAL LOW (ref 3.87–5.11)
RDW: 15.8 % — ABNORMAL HIGH (ref 11.5–15.5)
WBC: 5.7 10*3/uL (ref 4.0–10.5)
nRBC: 0 % (ref 0.0–0.2)

## 2023-02-04 LAB — MAGNESIUM: Magnesium: 1.8 mg/dL (ref 1.7–2.4)

## 2023-02-04 MED ORDER — DEXTROSE 50 % IV SOLN
1.0000 | Freq: Once | INTRAVENOUS | Status: AC
  Administered 2023-02-04: 50 mL via INTRAVENOUS
  Filled 2023-02-04: qty 50

## 2023-02-04 NOTE — Progress Notes (Addendum)
PROGRESS NOTE    Amanda [name]  KVQ:[id number] DOB: 1945/07/06 DOA: 01/18/2023 PCP: [name], MD  Chief Complaint  Patient presents with   Altered Mental Status   Hypertension    Brief Narrative:   Amanda [name] is Amanda [name] 77 y.o. female with medical history significant of moderate dementia who presents with progressive AMS and poor PO intake.     She was admitted with AKI, hypernatremia, and AMS.  She's now s/p trial of NG tube feeding (10/9-10/15).  Hospital course complicated by refeeding syndrome and diarrhea.  She continues to have delirium with poor PO intake.  Palliative following to discuss goals of care.  Ongoing goals of care conversations.   Assessment & Plan:   Principal Problem:   Acute metabolic encephalopathy Active Problems:   Hypernatremia   SIRS (systemic inflammatory response syndrome) (HCC)   Refeeding syndrome   AKI (acute kidney injury) (HCC)   Decubitus ulcer   Dysphagia   Hypertension   Moderate dementia (HCC)   Acute deep vein thrombosis (DVT) of left lower extremity (HCC)   DNR (do not resuscitate)/DNI(Do Not Intubate)   Hypokalemia   Hypomagnesemia   Protein-calorie malnutrition, severe  Goals of Care Patient remains critically ill with persistent encephalopathy. Her overall prognosis is poor.  With improvement in her creatinine/bun, her mental status has not significantly improved from my standpoint.  Now s/p tube feeding from 10/9-10/15.  On 10/16 discussed with daughter, Amanda [name].  She notes "great improvement" since last week.  Notes she was "very alert" last Friday.  "Impressed" by how much food she was willing to eat.  Discussed with daughter that though she may be taking PO, I think based on the chart review and current exam her PO intake likely inadequate to meet her needs - I agree with Dr. [name] that hospice is Amanda [name] reasonable option.  Will watch how she does off IVF (labs pending today, though she's had multiple episodes of mild  hypoglycemia).  Palliative continuing to follow and assist with GOC conversations.   Addendum: family meeting today with palliative.  Planning calorie count.     Acute metabolic encephalopathy Dementia  Dementia for years.  at baseline, wheelchair bound (walked 80 ft with walker with assistance in early September at rehab - wheelchair bound since being home), she's disoriented typically, but will communicate.   Likely multifactorial (hypernatremia, uremia/AKI, dementia) but inciting cause unclear.   No clear infectious cause (CXR without active disease, CT maxillofacial without notable findings, CT abd/pelvis without acute findings, decub doesn't appear infected) TSH wnl, ammonia wnl  B12 (elevated), folate low -> supplement EEG with moderate to severe diffuse encephalopathy, no seizures or epileptiform discharges CT head without acute abnormality MRI brain without acute abnormality  Treating hypernatremia/AKI (significantly improved) Consider additional workup/imaging - she remains altered, but has gradually improved from admission - seems like she is still far from her previous baseline, not sure she'll improve to that point again    Refeeding Syndrome Diarrhea improved Will continue to monitor with her PO intake  She removed rectal tube overnight   SIRS (systemic inflammatory response syndrome) (HCC) Initial tachycardia, WBC 17k.  Question of SIRS / Sepsis with unclear source Sepsis ruled out.  UA neg No obvious dental infection on my exam Decubitus ulcer is at least stage 2, but no obvious erythema / drainage CT maxilofacial to r/o dental abscess -> without acute findings CT abd pelvis with large stool burden, air within bladder Repeat CXR 10/3 ->  without acute cardiopulm dz  Blood cultures NGx5 Urine cx no growth  Covid and RVP negative S/p ceftriaxone empirically (7 days)    Dysphagia Dysphagia 1, thin liquid per SLP 10/15 S/p NG tube 10/9-10/15  Monitor PO intake ->  0-10% documented over last few meals   Hypernatremia Labs pending today as we see how she's done with about 48 hrs without supplemental IVF   AKI  Resolved labs pending today as we see how she's done with about 48 hrs without supplemental IVF   Hypoglycemia Monitor off d5 fluids -> 3 episodes of mild hypoglycemia within past 24 hrs   DVT Extensive --> [name] with DVT involving L common femoral vein, SF junction, L femoral vein, L proximal profunda vein, L popliteal vein, L posterior tibial veins, and L peroneal veins eliquis    Hypokalemia Hypomagnesemia Replace and follow  Labs pending today   Elevated Lipase Unclear significance - CT without findings c/w pancreatitis, not significantly tender on exam Trend -> resolved   Decubitus ulcer Wound care consult -> appreciate recs   Hypertension Chart diagnosis, doesn't appear to be on any chronic meds for this. BP appropriate without meds    DVT prophylaxis: eliquis Code Status: DNR Family Communication: none at bedside - daughter over phone 10/16 Disposition:   Status is: Inpatient Remains inpatient appropriate because: need for continued inpatient care   Consultants:  palliative  Procedures:  10/4 IMPRESSION: This study is suggestive of moderate to severe diffuse encephalopathy. No seizures or epileptiform discharges were seen throughout the recording.  10/9  Cortrak    Antimicrobials:  Anti-infectives (From admission, onward)    Start     Dose/Rate Route Frequency Ordered Stop   01/19/23 1900  ceFEPIme (MAXIPIME) 2 g in sodium chloride 0.9 % 100 mL IVPB  Status:  Discontinued        2 g 200 mL/hr over 30 Minutes Intravenous Every 24 hours 01/19/23 0338 01/19/23 0945   01/19/23 1000  cefTRIAXone (ROCEPHIN) 2 g in sodium chloride 0.9 % 100 mL IVPB       Note to Pharmacy: Retime as indicated   2 g 200 mL/hr over 30 Minutes Intravenous Every 24 hours 01/19/23 0945 01/25/23 1325   01/19/23 0342  vancomycin  variable dose per unstable renal function (pharmacist dosing)  Status:  Discontinued         Does not apply See admin instructions 01/19/23 0343 01/20/23 0718   01/18/23 1830  vancomycin (VANCOREADY) IVPB 1250 mg/250 mL        1,250 mg 166.7 mL/hr over 90 Minutes Intravenous  Once 01/18/23 1818 01/18/23 2122   01/18/23 1815  ceFEPIme (MAXIPIME) 2 g in sodium chloride 0.9 % 100 mL IVPB        2 g 200 mL/hr over 30 Minutes Intravenous  Once 01/18/23 1807 01/18/23 1911       Subjective: Again doesn't say anything  Objective: Vitals:   02/03/23 1510 02/03/23 2002 02/04/23 0418 02/04/23 0749  BP: 95/74 (!) 110/51 (!) 108/45 (!) 104/90  Pulse: 89 100 96 95  Resp:  18 16 16   Temp:  99 F (37.2 C)  98.4 F (36.9 C)  TempSrc:  Axillary  Oral  SpO2: 100% 99% 100% 100%  Weight:        Intake/Output Summary (Last 24 hours) at 02/04/2023 0828 Last data filed at 02/03/2023 1500 Gross per 24 hour  Intake 30 ml  Output --  Net 30 ml   [employer]  01/31/23 0649 01/31/23 1800 02/03/23 0500  Weight: 85 kg 50 kg 50 kg    Examination:  General: No acute distress. Cardiovascular: RRR Lungs: unlabored Abdomen: Soft, nontender, nondistended  Neurological: awake - more alert today than yesterday, looks at me, but doesn't speak  Extremities: LE edema, legs in soft boots bilaterally      Data Reviewed: I have personally reviewed following labs and imaging studies  CBC: Recent Labs  Lab 01/29/23 0430 01/30/23 0543 01/31/23 0820 02/02/23 0453 02/03/23 0552  WBC 11.2* 11.5* 7.8 7.8 5.3  NEUTROABS  --   --   --  6.0 3.8  HGB 9.8* 9.2* 8.2* 8.4* 9.0*  HCT 30.5* 29.2* 26.0* 26.1* 28.6*  MCV 83.8 81.8 80.2 81.1 82.4  PLT 287 267 238 294 257    Basic Metabolic Panel: Recent Labs  Lab 01/28/23 0917 01/29/23 0430 01/30/23 0543 01/31/23 0820 02/02/23 0453 02/03/23 0552  NA  --  138 135 135 138 138  K  --  4.5 4.9 4.7 4.1 4.1  CL  --  103 101 101 99 104  CO2  --  26 25  26 26 26   GLUCOSE  --  112* 154* 131* 113* 92  BUN  --  5* 5* 7* 6* 7*  CREATININE  --  0.51 0.50 0.53 0.51 0.58  CALCIUM  --  8.2* 8.1* 8.1* 8.9 8.6*  MG  --   --  1.4* 1.4*  --  1.8  PHOS 2.0*  --   --   --   --  3.7    GFR: Estimated Creatinine Clearance: 46.5 mL/min (by C-G formula based on SCr of 0.58 mg/dL).  Liver Function Tests: Recent Labs  Lab 01/29/23 0430 01/30/23 0543 01/31/23 0820 02/02/23 0453 02/03/23 0552  AST 32 29 31 47* 42*  ALT 45* 35 33 53* 49*  ALKPHOS 73 67 58 59 53  BILITOT 0.3 0.4 0.2* 0.4 <0.1*  PROT 6.8 6.3* 6.1* 6.3* 6.6  ALBUMIN 2.3* 2.1* 2.0* 2.1* 2.2*    CBG: Recent Labs  Lab 02/03/23 2331 02/04/23 0037 02/04/23 0401 02/04/23 0641 02/04/23 0751  GLUCAP 67* 95 68* 125* 71     No results found for this or any previous visit (from the past 240 hour(s)).       Radiology Studies: No results found.      Scheduled Meds:  apixaban  5 mg Oral BID   feeding supplement  237 mL Oral BID BM   folic acid  1 mg Oral Daily   leptospermum manuka honey  1 Application Topical Daily   Continuous Infusions:     LOS: 17 days    Time spent: over 30 min     Lacretia Nicks, MD [facility]   To contact the attending provider between 7A-7P or the covering provider during after hours 7P-7A, please log into the web site [email or web address] and access using universal [place] password for that web site. If you do not have the password, please call the hospital operator.  02/04/2023, 8:28 AM

## 2023-02-04 NOTE — Progress Notes (Signed)
Daily Progress Note   Patient Name: Amanda [name]       Date: 02/04/2023 DOB: Dec 17, 1945  Age: 77 y.o. MRN#: [id number] Attending Physician: Zigmund Daniel., * Primary Care Physician: [name], MD Admit Date: 01/18/2023  Reason for Consultation/Follow-up: Establishing goals of care  Subjective: Patient laying in bed sleeping in NAD. No family at bedside.  Length of Stay: 17  Current Medications: Scheduled Meds:   apixaban  5 mg Oral BID   feeding supplement  237 mL Oral BID BM   folic acid  1 mg Oral Daily   leptospermum manuka honey  1 Application Topical Daily    Continuous Infusions:    PRN Meds: acetaminophen (TYLENOL) oral liquid 160 mg/5 mL, ondansetron **OR** ondansetron (ZOFRAN) IV, mouth rinse  Physical Exam Vitals reviewed.  Constitutional:      General: She is sleeping.     Appearance: She is ill-appearing.  HENT:     Head: Normocephalic and atraumatic.  Cardiovascular:     Rate and Rhythm: Normal rate.  Pulmonary:     Effort: Pulmonary effort is normal.  Skin:    General: Skin is warm and dry.  Neurological:     Mental Status: She is easily aroused.             Vital Signs: BP 120/82 (BP Location: Right Arm)   Pulse 95   Temp 98.2 F (36.8 C) (Oral)   Resp 20   Wt 50 kg   SpO2 100%   BMI 18.34 kg/m  SpO2: SpO2: 100 % O2 Device: O2 Device: Room Air O2 Flow Rate: O2 Flow Rate (L/min): 0 L/min  Intake/output summary:  Intake/Output Summary (Last 24 hours) at 02/04/2023 1405 Last data filed at 02/03/2023 1500 Gross per 24 hour  Intake 30 ml  Output --  Net 30 ml    LBM: Last BM Date : 02/03/23 Baseline Weight: Weight: 58.1 kg Most recent weight: Weight: 50 kg    Patient Active Problem List   Diagnosis Date Noted    Refeeding syndrome 01/27/2023   Acute deep vein thrombosis (DVT) of left lower extremity (HCC) 01/26/2023   DNR (do not resuscitate)/DNI(Do Not Intubate) 01/26/2023   Dysphagia 01/26/2023   Hypokalemia 01/26/2023   Hypomagnesemia 01/26/2023   Protein-calorie malnutrition, severe 01/26/2023   Hypernatremia 01/18/2023  SIRS (systemic inflammatory response syndrome) (HCC) 01/18/2023   Decubitus ulcer 01/18/2023   Acute metabolic encephalopathy 10/09/2022   AKI (acute kidney injury) (HCC) 10/08/2022   Moderate dementia (HCC) 10/02/2014   Hyperlipidemia with target low density lipoprotein (LDL) cholesterol less than 130 mg/dL 82/95/6213   Hypertension 06/12/2012    Palliative Care Assessment & Plan   Patient Profile: 77 y.o. female  with past medical history of moderate dementia admitted on 01/18/2023 with AMS and poor PO intake. She was found to have AKI and hypernatremia.   Today's discussion:  14:50: Meeting with Dr. Lowell Guitar, [name] (patient's husband), and [name] (patient's daughter)  Dr. Lowell Guitar reviewed the patient's current status including her dementia, DVTs, pressure ulcer, and decreased oral intake. We discussed that the patient's oral intake is the most concerning for the team. We discussed the importance of hydration and oral intake. We are unsure how much the patient is really taking in so we discussed a calorie count. Encouraged the family to be present at meal times, to encourage the patient to eat calorie and protein dense foods, and encouraged family to bring food from home. The family's goal right now is to optimize the patient's intake.  Dr. Lowell Guitar reviewed labs and clinical indicators he will be looking at to ensure the patient is able to sustain herself with oral nutrition.   We discussed hoping for the best-- that the patient can take sufficient intake, but being prepared to pivot/adjust the goals if she cannot. Family understands. We plan to meet again on Monday to  review calorie count and goals.  Discussed the importance of continued conversation with family and the medical providers regarding overall plan of care and treatment options, ensuring decisions are within the context of the patient's values and GOCs.   Questions and concerns were addressed. The family was encouraged to call with questions or concerns. PMT will continue to support holistically.  Recommendations/Plan: DNR/DNI Limited scope of treatment Calorie count Continued PMT support- Meeting planned for Monday 10/21 at 1530   Code Status:    Code Status Orders  (From admission, onward)           Start     Ordered   01/28/23 1226  Do not attempt resuscitation (DNR)- Limited -Do Not Intubate (DNI)  (Code Status)  Continuous       Question Answer Comment  If pulseless and not breathing No CPR or chest compressions.   In Pre-Arrest Conditions (Patient Is Breathing and Has A Pulse) Do not intubate. Provide all appropriate non-invasive medical interventions. Avoid ICU transfer unless indicated or required.   Consent: Discussion documented in EHR or advanced directives reviewed      01/28/23 1226           Extensive chart review has been completed prior to seeing the patient and speaking with her daughter by phone including labs, vital signs, imagine, progress/consult notes, orders, medications, and available advance directive documents.  Care plan was discussed with bedside Dr. Lowell Guitar  Time spent: 90 minutes  Thank you for allowing the Palliative Medicine Team to assist in the care of this patient.   Sherryll Burger, NP  Please contact Palliative Medicine Team phone at [phone] for questions and concerns.

## 2023-02-04 NOTE — Plan of Care (Signed)
  Problem: Coping: Goal: Ability to adjust to condition or change in health will improve Outcome: Progressing   Problem: Fluid Volume: Goal: Ability to maintain a balanced intake and output will improve Outcome: Progressing   Problem: Skin Integrity: Goal: Risk for impaired skin integrity will decrease Outcome: Progressing   Problem: Tissue Perfusion: Goal: Adequacy of tissue perfusion will improve Outcome: Progressing

## 2023-02-04 NOTE — Progress Notes (Signed)
Patient's blood sugar dropped below 70 twice this shift. Patient has a poor po intake. Dextrose 50% Ampule given twice this shift. Will continue to monitor.

## 2023-02-04 NOTE — Plan of Care (Signed)
  Problem: Fluid Volume: Goal: Ability to maintain a balanced intake and output will improve Outcome: Progressing   Problem: Health Behavior/Discharge Planning: Goal: Ability to manage health-related needs will improve Outcome: Progressing   Problem: Metabolic: Goal: Ability to maintain appropriate glucose levels will improve Outcome: Progressing   Problem: Nutritional: Goal: Maintenance of adequate nutrition will improve Outcome: Progressing   Problem: Skin Integrity: Goal: Risk for impaired skin integrity will decrease Outcome: Progressing

## 2023-02-04 NOTE — Plan of Care (Signed)
[facility] Tonsillectomy/Adenoidectomy, Postoperative PEDS care plan entered in error.

## 2023-02-04 NOTE — Progress Notes (Signed)
Physical Therapy Treatment Patient Details Name: Amanda [name] MRN: [id number] DOB: [birth year] Today's Date: 02/04/2023   History of Present Illness Pt is a 77 year old woman admitted on 10/1 with AMS, poor PO intake, SIRS, hypernatremia, AKI secondary to dehydration. Pt + for DVT. Required cortrak due to dysphagia, now discontinued. PMH: dementia, sacral wound, PNA and UTI in June 2024.    PT Comments  Pt greeted resting in bed no family present at bedside. Pt requiring total A to come to sit up EOB and max A to return to supine. Pt unable to follow commands for seated therex and shaking head no when asked to attempts standing and to transfer to chair. Pt able to maintain sitting balance with supervision for safety for increased time and responding well to music. Pt seated up in bed in partial chair position at end of session. Pt continues to benefit from skilled PT services to progress toward functional mobility goals.      If plan is discharge home, recommend the following: Two people to help with walking and/or transfers;Two people to help with bathing/dressing/bathroom;Assistance with feeding;Assistance with cooking/housework   Can travel by private vehicle     No  Equipment Recommendations  None recommended by PT    Recommendations for Other Services       Precautions / Restrictions Precautions Precautions: Fall Restrictions Weight Bearing Restrictions: No     Mobility  Bed Mobility Overal bed mobility: Needs Assistance Bed Mobility: Sidelying to Sit, Sit to Sidelying, Rolling Rolling: Mod assist Sidelying to sit: Total assist     Sit to sidelying: Max assist General bed mobility comments: assist for all aspects    Transfers                   General transfer comment: unable this date    Ambulation/Gait                   Stairs             Wheelchair Mobility     Tilt Bed    Modified Rankin (Stroke Patients Only)        Balance Overall balance assessment: Needs assistance Sitting-balance support: Feet supported, No upper extremity supported Sitting balance-Leahy Scale: Fair Sitting balance - Comments: supervision for sitting EOB     Standing balance-Leahy Scale: Zero                              Cognition Arousal: Alert Behavior During Therapy: Flat affect (tapping feet to music and playing "air drums") Overall Cognitive Status: History of cognitive impairments - at baseline                                 General Comments: pt non verbal, moving to music        Exercises Other Exercises Other Exercises: unable to follow commands to complete    General Comments General comments (skin integrity, edema, etc.): pt engaging with music during session      Pertinent Vitals/Pain Pain Assessment Pain Assessment: Faces Faces Pain Scale: Hurts a little bit Pain Location: generalized Pain Descriptors / Indicators: Moaning Pain Intervention(s): Monitored during session, Limited activity within patient's tolerance, Repositioned    Home Living  Prior Function            PT Goals (current goals can now be found in the care plan section) Acute Rehab PT Goals Patient Stated Goal: Pt unable to state PT Goal Formulation: With family Time For Goal Achievement: 02/16/23 Progress towards PT goals: Progressing toward goals    Frequency    Min 1X/week      PT Plan      Co-evaluation              AM-PAC PT "6 Clicks" Mobility   Outcome Measure  Help needed turning from your back to your side while in a flat bed without using bedrails?: Total Help needed moving from lying on your back to sitting on the side of a flat bed without using bedrails?: Total Help needed moving to and from a bed to a chair (including a wheelchair)?: Total Help needed standing up from a chair using your arms (e.g., wheelchair or bedside chair)?:  Total Help needed to walk in hospital room?: Total Help needed climbing 3-5 steps with a railing? : Total 6 Click Score: 6    End of Session   Activity Tolerance: Patient tolerated treatment well Patient left: in bed;with call bell/phone within reach;Other (comment) (with music playing from computer) Nurse Communication: Mobility status PT Visit Diagnosis: Other abnormalities of gait and mobility (R26.89);Muscle weakness (generalized) (M62.81)     Time: 8756-4332 PT Time Calculation (min) (ACUTE ONLY): 28 min  Charges:    $Therapeutic Activity: 23-37 mins PT General Charges $$ ACUTE PT VISIT: 1 Visit                     Ajahnae Rathgeber R. PTA Acute Rehabilitation Services Office: [phone]   Catalina Antigua 02/04/2023, 11:24 AM

## 2023-02-05 DIAGNOSIS — G9341 Metabolic encephalopathy: Secondary | ICD-10-CM | POA: Diagnosis not present

## 2023-02-05 LAB — GLUCOSE, CAPILLARY
Glucose-Capillary: 66 mg/dL — ABNORMAL LOW (ref 70–99)
Glucose-Capillary: 73 mg/dL (ref 70–99)
Glucose-Capillary: 79 mg/dL (ref 70–99)
Glucose-Capillary: 81 mg/dL (ref 70–99)
Glucose-Capillary: 85 mg/dL (ref 70–99)
Glucose-Capillary: 90 mg/dL (ref 70–99)

## 2023-02-05 LAB — CBC WITH DIFFERENTIAL/PLATELET
Abs Immature Granulocytes: 0 10*3/uL (ref 0.00–0.07)
Basophils Absolute: 0 10*3/uL (ref 0.0–0.1)
Basophils Relative: 0 %
Eosinophils Absolute: 0 10*3/uL (ref 0.0–0.5)
Eosinophils Relative: 0 %
HCT: 28.2 % — ABNORMAL LOW (ref 36.0–46.0)
Hemoglobin: 8.7 g/dL — ABNORMAL LOW (ref 12.0–15.0)
Lymphocytes Relative: 18 %
Lymphs Abs: 0.9 10*3/uL (ref 0.7–4.0)
MCH: 25.1 pg — ABNORMAL LOW (ref 26.0–34.0)
MCHC: 30.9 g/dL (ref 30.0–36.0)
MCV: 81.5 fL (ref 80.0–100.0)
Monocytes Absolute: 0.2 10*3/uL (ref 0.1–1.0)
Monocytes Relative: 4 %
Neutro Abs: 3.7 10*3/uL (ref 1.7–7.7)
Neutrophils Relative %: 78 %
Platelets: 358 10*3/uL (ref 150–400)
RBC: 3.46 MIL/uL — ABNORMAL LOW (ref 3.87–5.11)
RDW: 15.7 % — ABNORMAL HIGH (ref 11.5–15.5)
WBC: 4.8 10*3/uL (ref 4.0–10.5)
nRBC: 0 % (ref 0.0–0.2)
nRBC: 0 /100{WBCs}

## 2023-02-05 LAB — MAGNESIUM: Magnesium: 1.7 mg/dL (ref 1.7–2.4)

## 2023-02-05 LAB — COMPREHENSIVE METABOLIC PANEL
ALT: 30 U/L (ref 0–44)
AST: 23 U/L (ref 15–41)
Albumin: 2.4 g/dL — ABNORMAL LOW (ref 3.5–5.0)
Alkaline Phosphatase: 46 U/L (ref 38–126)
Anion gap: 10 (ref 5–15)
BUN: 5 mg/dL — ABNORMAL LOW (ref 8–23)
CO2: 22 mmol/L (ref 22–32)
Calcium: 8.5 mg/dL — ABNORMAL LOW (ref 8.9–10.3)
Chloride: 108 mmol/L (ref 98–111)
Creatinine, Ser: 0.48 mg/dL (ref 0.44–1.00)
GFR, Estimated: >60 mL/min (ref >60)
Glucose, Bld: 78 mg/dL (ref 70–99)
Potassium: 3.4 mmol/L — ABNORMAL LOW (ref 3.5–5.1)
Sodium: 140 mmol/L (ref 135–145)
Total Bilirubin: 0.4 mg/dL (ref 0.3–1.2)
Total Protein: 6.6 g/dL (ref 6.5–8.1)

## 2023-02-05 LAB — PHOSPHORUS: Phosphorus: 3.3 mg/dL (ref 2.5–4.6)

## 2023-02-05 MED ORDER — POTASSIUM CHLORIDE 20 MEQ PO PACK
40.0000 meq | PACK | Freq: Once | ORAL | Status: DC
  Filled 2023-02-05: qty 2

## 2023-02-05 NOTE — Progress Notes (Signed)
PROGRESS NOTE    Amanda [name]  XBJ:[id number] DOB: [birth year] DOA: 01/18/2023 PCP: [name], MD  Chief Complaint  Patient presents with   Altered Mental Status   Hypertension    Brief Narrative:   Amanda [name] is Amanda [name] 77 y.o. female with medical history significant of moderate dementia who presents with progressive AMS and poor PO intake.     She was admitted with AKI, hypernatremia, and AMS.  She's now s/p trial of NG tube feeding (10/9-10/15).  Hospital course complicated by refeeding syndrome and diarrhea.  She continues to have delirium with poor PO intake.  Palliative following to discuss goals of care.  Ongoing goals of care conversations.   Assessment & Plan:   Principal Problem:   Acute metabolic encephalopathy Active Problems:   Hypernatremia   SIRS (systemic inflammatory response syndrome) (HCC)   Refeeding syndrome   AKI (acute kidney injury) (HCC)   Decubitus ulcer   Dysphagia   Hypertension   Moderate dementia (HCC)   Acute deep vein thrombosis (DVT) of left lower extremity (HCC)   DNR (do not resuscitate)/DNI(Do Not Intubate)   Hypokalemia   Hypomagnesemia   Protein-calorie malnutrition, severe  Goals of Care Patient remains critically ill with persistent encephalopathy. Her overall prognosis is poor.  With improvement in her creatinine/bun, her mental status has not significantly improved from my standpoint.  Now s/p tube feeding from 10/9-10/15.  On 10/16 discussed with daughter, [name].  She notes "great improvement" since last week.  Notes she was "very alert" last Friday.  "Impressed" by how much food she was willing to eat.  Discussed with daughter that though she may be taking PO, I think based on the chart review and current exam her PO intake likely inadequate to meet her needs - I agree with Dr. [name] that hospice is [name] reasonable option.  Will watch how she does off IVF and follow calorie count.  Another family meeting planned for  10/20 at 3:30 PM.     Acute metabolic encephalopathy Dementia  Dementia for years.  at baseline, wheelchair bound (walked 80 ft with walker with assistance in early September at rehab - wheelchair bound since being home), she's disoriented typically, but will communicate.   Likely multifactorial (hypernatremia, uremia/AKI, dementia) but inciting cause unclear.   No clear infectious cause (CXR without active disease, CT maxillofacial without notable findings, CT abd/pelvis without acute findings, decub doesn't appear infected) TSH wnl, ammonia wnl  B12 (elevated), folate low -> supplement EEG with moderate to severe diffuse encephalopathy, no seizures or epileptiform discharges CT head without acute abnormality MRI brain without acute abnormality  Treating hypernatremia/AKI (significantly improved) Consider additional workup/imaging - she remains altered, but has gradually improved from admission - seems like she is still far from her previous baseline, not sure she'll improve to that point again    Refeeding Syndrome Diarrhea improved Will continue to monitor with her PO intake  She removed rectal tube overnight   SIRS (systemic inflammatory response syndrome) (HCC) Initial tachycardia, WBC 17k.  Question of SIRS / Sepsis with unclear source Sepsis ruled out.  UA neg No obvious dental infection on my exam Decubitus ulcer is at least stage 2, but no obvious erythema / drainage CT maxilofacial to r/o dental abscess -> without acute findings CT abd pelvis with large stool burden, air within bladder Repeat CXR 10/3 -> without acute cardiopulm dz  Blood cultures NGx5 Urine cx no growth  Covid and RVP negative S/p ceftriaxone  empirically (7 days)    Dysphagia Dysphagia 1, thin liquid per SLP 10/15 S/p NG tube 10/9-10/15  Calorie count   Hypernatremia stable   AKI  resolved   Hypoglycemia Monitor off d5 fluids -> continued intermittent mild hypoglycemia to 60's - will monitor     DVT Extensive --> [name] with DVT involving L common femoral vein, SF junction, L femoral vein, L proximal profunda vein, L popliteal vein, L posterior tibial veins, and L peroneal veins eliquis    Hypokalemia Hypomagnesemia Replace and follow    Elevated Lipase Unclear significance - CT without findings c/w pancreatitis, not significantly tender on exam Trend -> resolved   Decubitus ulcer Wound care consult -> appreciate recs   Hypertension Chart diagnosis, doesn't appear to be on any chronic meds for this. BP appropriate without meds    DVT prophylaxis: eliquis Code Status: DNR Family Communication: none at bedside - daughter over phone 10/16 Disposition:   Status is: Inpatient Remains inpatient appropriate because: need for continued inpatient care   Consultants:  palliative  Procedures:  10/4 IMPRESSION: This study is suggestive of moderate to severe diffuse encephalopathy. No seizures or epileptiform discharges were seen throughout the recording.  10/9  Cortrak    Antimicrobials:  Anti-infectives (From admission, onward)    Start     Dose/Rate Route Frequency Ordered Stop   01/19/23 1900  ceFEPIme (MAXIPIME) 2 g in sodium chloride 0.9 % 100 mL IVPB  Status:  Discontinued        2 g 200 mL/hr over 30 Minutes Intravenous Every 24 hours 01/19/23 0338 01/19/23 0945   01/19/23 1000  cefTRIAXone (ROCEPHIN) 2 g in sodium chloride 0.9 % 100 mL IVPB       Note to Pharmacy: Retime as indicated   2 g 200 mL/hr over 30 Minutes Intravenous Every 24 hours 01/19/23 0945 01/25/23 1325   01/19/23 0342  vancomycin variable dose per unstable renal function (pharmacist dosing)  Status:  Discontinued         Does not apply See admin instructions 01/19/23 0343 01/20/23 0718   01/18/23 1830  vancomycin (VANCOREADY) IVPB 1250 mg/250 mL        1,250 mg 166.7 mL/hr over 90 Minutes Intravenous  Once 01/18/23 1818 01/18/23 2122   01/18/23 1815  ceFEPIme (MAXIPIME) 2 g in sodium  chloride 0.9 % 100 mL IVPB        2 g 200 mL/hr over 30 Minutes Intravenous  Once 01/18/23 1807 01/18/23 1911       Subjective: Doesn't say anything Husband is at bedside  Objective: Vitals:   02/04/23 1958 02/05/23 0346 02/05/23 0743 02/05/23 1557  BP: (!) 104/48 137/69 129/63 129/62  Pulse: 89 (!) 104 99 (!) 101  Resp: 18 18 16 13   Temp: 99.1 F (37.3 C) 98.1 F (36.7 C) 98.3 F (36.8 C) 97.9 F (36.6 C)  TempSrc: Oral Oral Oral Oral  SpO2: 100% 100% 100% 100%  Weight:        Intake/Output Summary (Last 24 hours) at 02/05/2023 1619 Last data filed at 02/05/2023 1200 Gross per 24 hour  Intake 30 ml  Output --  Net 30 ml   Filed Weights   01/31/23 0649 01/31/23 1800 02/03/23 0500  Weight: 85 kg 50 kg 50 kg    Examination:  General: No acute distress. Lungs: unlabored Neurological: awake, but confused - doesn't say anything while I'm in room Extremities: LE edema     Data Reviewed: I have personally reviewed following  labs and imaging studies  CBC: Recent Labs  Lab 01/31/23 0820 02/02/23 0453 02/03/23 0552 02/04/23 1356 02/05/23 0703  WBC 7.8 7.8 5.3 5.7 4.8  NEUTROABS  --  6.0 3.8 4.1 3.7  HGB 8.2* 8.4* 9.0* 9.0* 8.7*  HCT 26.0* 26.1* 28.6* 29.3* 28.2*  MCV 80.2 81.1 82.4 83.5 81.5  PLT 238 294 257 328 358    Basic Metabolic Panel: Recent Labs  Lab 01/30/23 0543 01/31/23 0820 02/02/23 0453 02/03/23 0552 02/04/23 1356 02/05/23 0703  NA 135 135 138 138 141 140  K 4.9 4.7 4.1 4.1 3.6 3.4*  CL 101 101 99 104 105 108  CO2 25 26 26 26 24 22   GLUCOSE 154* 131* 113* 92 80 78  BUN 5* 7* 6* 7* <5* 5*  CREATININE 0.50 0.53 0.51 0.58 0.49 0.48  CALCIUM 8.1* 8.1* 8.9 8.6* 8.8* 8.5*  MG 1.4* 1.4*  --  1.8 1.8 1.7  PHOS  --   --   --  3.7 3.2 3.3    GFR: Estimated Creatinine Clearance: 46.5 mL/min (by C-G formula based on SCr of 0.48 mg/dL).  Liver Function Tests: Recent Labs  Lab 01/31/23 0820 02/02/23 0453 02/03/23 0552 02/04/23 1356  02/05/23 0703  AST 31 47* 42* 26 23  ALT 33 53* 49* 36 30  ALKPHOS 58 59 53 55 46  BILITOT 0.2* 0.4 <0.1* 0.7 0.4  PROT 6.1* 6.3* 6.6 6.6 6.6  ALBUMIN 2.0* 2.1* 2.2* 2.4* 2.4*    CBG: Recent Labs  Lab 02/04/23 2056 02/05/23 0026 02/05/23 0344 02/05/23 0742 02/05/23 1552  GLUCAP 61* 79 81 85 66*     No results found for this or any previous visit (from the past 240 hour(s)).       Radiology Studies: No results found.      Scheduled Meds:  apixaban  5 mg Oral BID   feeding supplement  237 mL Oral BID BM   folic acid  1 mg Oral Daily   leptospermum manuka honey  1 Application Topical Daily   Continuous Infusions:     LOS: 18 days    Time spent: over 30 min     Lacretia Nicks, MD [facility]   To contact the attending provider between 7A-7P or the covering provider during after hours 7P-7A, please log into the web site [email or web address] and access using universal [place] password for that web site. If you do not have the password, please call the hospital operator.  02/05/2023, 4:19 PM

## 2023-02-05 NOTE — Progress Notes (Signed)
Nutrition Brief Note  Consult received for initiation of calorie count per PMT.  RD working remotely. Secure chat sent to RN to ensure calorie count envelope placed on door and all meal tickets are saved with percent consumed beside each menu item.  RD to collect meal tickets on Monday for assessment of nutritional adequacy.   Please reach out in the meantime via secure chat or on-call pager if additional nutrition related needs should arise over the weekend.   Drusilla Kanner, RDN, LDN Clinical Nutrition

## 2023-02-06 DIAGNOSIS — G9341 Metabolic encephalopathy: Secondary | ICD-10-CM | POA: Diagnosis not present

## 2023-02-06 LAB — BASIC METABOLIC PANEL
Anion gap: 11 (ref 5–15)
BUN: <5 mg/dL — ABNORMAL LOW (ref 8–23)
CO2: 22 mmol/L (ref 22–32)
Calcium: 8.3 mg/dL — ABNORMAL LOW (ref 8.9–10.3)
Chloride: 107 mmol/L (ref 98–111)
Creatinine, Ser: 0.5 mg/dL (ref 0.44–1.00)
GFR, Estimated: >60 mL/min (ref >60)
Glucose, Bld: 97 mg/dL (ref 70–99)
Potassium: 3.9 mmol/L (ref 3.5–5.1)
Sodium: 140 mmol/L (ref 135–145)

## 2023-02-06 LAB — GLUCOSE, CAPILLARY
Glucose-Capillary: 101 mg/dL — ABNORMAL HIGH (ref 70–99)
Glucose-Capillary: 106 mg/dL — ABNORMAL HIGH (ref 70–99)
Glucose-Capillary: 138 mg/dL — ABNORMAL HIGH (ref 70–99)
Glucose-Capillary: 58 mg/dL — ABNORMAL LOW (ref 70–99)
Glucose-Capillary: 83 mg/dL (ref 70–99)
Glucose-Capillary: 85 mg/dL (ref 70–99)
Glucose-Capillary: 91 mg/dL (ref 70–99)

## 2023-02-06 LAB — CBC
HCT: 29.6 % — ABNORMAL LOW (ref 36.0–46.0)
Hemoglobin: 9.2 g/dL — ABNORMAL LOW (ref 12.0–15.0)
MCH: 25.1 pg — ABNORMAL LOW (ref 26.0–34.0)
MCHC: 31.1 g/dL (ref 30.0–36.0)
MCV: 80.7 fL (ref 80.0–100.0)
Platelets: 340 10*3/uL (ref 150–400)
RBC: 3.67 MIL/uL — ABNORMAL LOW (ref 3.87–5.11)
RDW: 15.6 % — ABNORMAL HIGH (ref 11.5–15.5)
WBC: 4.9 10*3/uL (ref 4.0–10.5)
nRBC: 0 % (ref 0.0–0.2)

## 2023-02-06 LAB — MAGNESIUM: Magnesium: 1.6 mg/dL — ABNORMAL LOW (ref 1.7–2.4)

## 2023-02-06 LAB — PHOSPHORUS: Phosphorus: 3 mg/dL (ref 2.5–4.6)

## 2023-02-06 MED ORDER — DEXTROSE 50 % IV SOLN
1.0000 | Freq: Once | INTRAVENOUS | Status: AC
  Administered 2023-02-06: 50 mL via INTRAVENOUS
  Filled 2023-02-06: qty 50

## 2023-02-06 NOTE — Plan of Care (Signed)
  Problem: Fluid Volume: Goal: Ability to maintain a balanced intake and output will improve Outcome: Progressing   Problem: Metabolic: Goal: Ability to maintain appropriate glucose levels will improve Outcome: Progressing   Problem: Nutritional: Goal: Maintenance of adequate nutrition will improve Outcome: Progressing   Problem: Skin Integrity: Goal: Risk for impaired skin integrity will decrease Outcome: Progressing   Problem: Pain Managment: Goal: General experience of comfort will improve Outcome: Progressing

## 2023-02-06 NOTE — Plan of Care (Signed)
  Problem: Skin Integrity: Goal: Risk for impaired skin integrity will decrease Outcome: Progressing   Problem: Nutrition: Goal: Adequate nutrition will be maintained Outcome: Progressing   Problem: Elimination: Goal: Will not experience complications related to bowel motility Outcome: Progressing   Problem: Safety: Goal: Ability to remain free from injury will improve Outcome: Progressing   Problem: Skin Integrity: Goal: Risk for impaired skin integrity will decrease Outcome: Progressing

## 2023-02-06 NOTE — Progress Notes (Addendum)
PROGRESS NOTE    TAEYLOR [name]  ZOX:[id number] DOB: 1945-07-21 DOA: 01/18/2023 PCP: [name], MD  Chief Complaint  Patient presents with   Altered Mental Status   Hypertension    Brief Narrative:   Amanda GRANADE is Amanda [name] 77 y.o. female with medical history significant of moderate dementia who presents with progressive AMS and poor PO intake.     She was admitted with AKI, hypernatremia, and AMS.  She's now s/p trial of NG tube feeding (10/9-10/15).  Hospital course complicated by refeeding syndrome and diarrhea.  She continues to have delirium with poor PO intake.  Palliative following to discuss goals of care.  Ongoing goals of care conversations.   Assessment & Plan:   Principal Problem:   Acute metabolic encephalopathy Active Problems:   Hypernatremia   SIRS (systemic inflammatory response syndrome) (HCC)   Refeeding syndrome   AKI (acute kidney injury) (HCC)   Decubitus ulcer   Dysphagia   Hypertension   Moderate dementia (HCC)   Acute deep vein thrombosis (DVT) of left lower extremity (HCC)   DNR (do not resuscitate)/DNI(Do Not Intubate)   Hypokalemia   Hypomagnesemia   Protein-calorie malnutrition, severe  Goals of Care Patient remains critically ill with persistent encephalopathy. Her overall prognosis is poor.  With improvement in her creatinine/bun, her mental status has not significantly improved from my standpoint.  Now s/p tube feeding from 10/9-10/15.  On 10/16 discussed with daughter, Amanda [name].  General impression was that she had been doing well on tube feeds.  Discussed my concerns that her PO intake is likely inadequate to meet her needs - I agree with Dr. [name] that hospice is [name] reasonable option.  Will watch how she does off IVF and follow calorie count.  Another family meeting planned for 10/21 at 3:30 PM.     Acute metabolic encephalopathy Dementia  Dementia for years.  at baseline, wheelchair bound (walked 80 ft with walker with  assistance in early September at rehab - wheelchair bound since being home), she's disoriented typically, but will communicate.   Likely multifactorial (hypernatremia, uremia/AKI, dementia) but inciting cause unclear.   No clear infectious cause (CXR without active disease, CT maxillofacial without notable findings, CT abd/pelvis without acute findings, decub doesn't appear infected) TSH wnl, ammonia wnl  B12 (elevated), folate low -> supplement EEG with moderate to severe diffuse encephalopathy, no seizures or epileptiform discharges CT head without acute abnormality MRI brain without acute abnormality  Treating hypernatremia/AKI (significantly improved) Consider additional workup/imaging - she remains altered, but has gradually improved from admission - seems like she is still far from her previous baseline, not sure she'll improve to that point again    Refeeding Syndrome Diarrhea improved Will continue to monitor with her PO intake  She removed rectal tube overnight   SIRS (systemic inflammatory response syndrome) (HCC) Initial tachycardia, WBC 17k.  Question of SIRS / Sepsis with unclear source Sepsis ruled out.  UA neg No obvious dental infection on my exam Decubitus ulcer is at least stage 2, but no obvious erythema / drainage CT maxilofacial to r/o dental abscess -> without acute findings CT abd pelvis with large stool burden, air within bladder Repeat CXR 10/3 -> without acute cardiopulm dz  Blood cultures NGx5 Urine cx no growth  Covid and RVP negative S/p ceftriaxone empirically (7 days)    Dysphagia Dysphagia 1, thin liquid per SLP 10/15 S/p NG tube 10/9-10/15  Calorie count   Hypernatremia stable   AKI  resolved   Hypoglycemia Monitor off d5 fluids -> continued intermittent mild hypoglycemia to 60's (had 1 episode in 50's overnight) - will monitor, if recurrent hypoglycemia to the 50's will start dextrose containing fluids, but hopefully can avoid this.    DVT Extensive --> [name] with DVT involving L common femoral vein, SF junction, L femoral vein, L proximal profunda vein, L popliteal vein, L posterior tibial veins, and L peroneal veins eliquis    Hypokalemia Hypomagnesemia Replace and follow    Elevated Lipase Unclear significance - CT without findings c/w pancreatitis, not significantly tender on exam Trend -> resolved   Decubitus ulcer Wound care consult -> appreciate recs   Hypertension Chart diagnosis, doesn't appear to be on any chronic meds for this. BP appropriate without meds    DVT prophylaxis: eliquis Code Status: DNR Family Communication: husband at bedside 10/19  Disposition:   Status is: Inpatient Remains inpatient appropriate because: need for continued inpatient care   Consultants:  palliative  Procedures:  10/4 IMPRESSION: This study is suggestive of moderate to severe diffuse encephalopathy. No seizures or epileptiform discharges were seen throughout the recording.  10/9  Cortrak    Antimicrobials:  Anti-infectives (From admission, onward)    Start     Dose/Rate Route Frequency Ordered Stop   01/19/23 1900  ceFEPIme (MAXIPIME) 2 g in sodium chloride 0.9 % 100 mL IVPB  Status:  Discontinued        2 g 200 mL/hr over 30 Minutes Intravenous Every 24 hours 01/19/23 0338 01/19/23 0945   01/19/23 1000  cefTRIAXone (ROCEPHIN) 2 g in sodium chloride 0.9 % 100 mL IVPB       Note to Pharmacy: Retime as indicated   2 g 200 mL/hr over 30 Minutes Intravenous Every 24 hours 01/19/23 0945 01/25/23 1325   01/19/23 0342  vancomycin variable dose per unstable renal function (pharmacist dosing)  Status:  Discontinued         Does not apply See admin instructions 01/19/23 0343 01/20/23 0718   01/18/23 1830  vancomycin (VANCOREADY) IVPB 1250 mg/250 mL        1,250 mg 166.7 mL/hr over 90 Minutes Intravenous  Once 01/18/23 1818 01/18/23 2122   01/18/23 1815  ceFEPIme (MAXIPIME) 2 g in sodium chloride 0.9 % 100 mL  IVPB        2 g 200 mL/hr over 30 Minutes Intravenous  Once 01/18/23 1807 01/18/23 1911       Subjective: Some moaning while getting repositioned  Objective: Vitals:   02/05/23 1755 02/05/23 2003 02/06/23 0428 02/06/23 0800  BP: (!) 108/58 (!) 113/54 121/61 (!) 119/51  Pulse: 100 100 93 91  Resp: 16   16  Temp: 98.1 F (36.7 C) 98.3 F (36.8 C) 98.5 F (36.9 C) 98.5 F (36.9 C)  TempSrc: Oral Oral Oral Oral  SpO2: 100% 100% 100% 100%  Weight:        Intake/Output Summary (Last 24 hours) at 02/06/2023 1610 Last data filed at 02/05/2023 1200 Gross per 24 hour  Intake 30 ml  Output --  Net 30 ml   Filed Weights   01/31/23 0649 01/31/23 1800 02/03/23 0500  Weight: 85 kg 50 kg 50 kg    Examination:  General: frail, chronically ill appearing - seen while she was getting cleaned up/repositioned by nursing Lungs: unlabored Neurological: disoriented, moving all extremities. Extremities: LE edema     Data Reviewed: I have personally reviewed following labs and imaging studies  CBC: Recent Labs  Lab 01/31/23 0820 02/02/23 0453 02/03/23 0552 02/04/23 1356 02/05/23 0703  WBC 7.8 7.8 5.3 5.7 4.8  NEUTROABS  --  6.0 3.8 4.1 3.7  HGB 8.2* 8.4* 9.0* 9.0* 8.7*  HCT 26.0* 26.1* 28.6* 29.3* 28.2*  MCV 80.2 81.1 82.4 83.5 81.5  PLT 238 294 257 328 358    Basic Metabolic Panel: Recent Labs  Lab 01/31/23 0820 02/02/23 0453 02/03/23 0552 02/04/23 1356 02/05/23 0703  NA 135 138 138 141 140  K 4.7 4.1 4.1 3.6 3.4*  CL 101 99 104 105 108  CO2 26 26 26 24 22   GLUCOSE 131* 113* 92 80 78  BUN 7* 6* 7* <5* 5*  CREATININE 0.53 0.51 0.58 0.49 0.48  CALCIUM 8.1* 8.9 8.6* 8.8* 8.5*  MG 1.4*  --  1.8 1.8 1.7  PHOS  --   --  3.7 3.2 3.3    GFR: Estimated Creatinine Clearance: 46.5 mL/min (by C-G formula based on SCr of 0.48 mg/dL).  Liver Function Tests: Recent Labs  Lab 01/31/23 0820 02/02/23 0453 02/03/23 0552 02/04/23 1356 02/05/23 0703  AST 31 47* 42* 26  23  ALT 33 53* 49* 36 30  ALKPHOS 58 59 53 55 46  BILITOT 0.2* 0.4 <0.1* 0.7 0.4  PROT 6.1* 6.3* 6.6 6.6 6.6  ALBUMIN 2.0* 2.1* 2.2* 2.4* 2.4*    CBG: Recent Labs  Lab 02/05/23 2005 02/06/23 0008 02/06/23 0158 02/06/23 0430 02/06/23 0801  GLUCAP 73 58* 138* 85 101*     No results found for this or any previous visit (from the past 240 hour(s)).       Radiology Studies: No results found.      Scheduled Meds:  apixaban  5 mg Oral BID   feeding supplement  237 mL Oral BID BM   folic acid  1 mg Oral Daily   leptospermum manuka honey  1 Application Topical Daily   potassium chloride  40 mEq Oral Once   Continuous Infusions:     LOS: 19 days    Time spent: over 30 min     Lacretia Nicks, MD [facility]   To contact the attending provider between 7A-7P or the covering provider during after hours 7P-7A, please log into the web site [email or web address] and access using universal [place] password for that web site. If you do not have the password, please call the hospital operator.  02/06/2023, 8:22 AM

## 2023-02-06 NOTE — Progress Notes (Signed)
Speech Language Pathology Treatment: Dysphagia  Patient Details Name: Amanda [name] MRN: [id number] DOB: 1945/06/17 Today's Date: 02/06/2023 Time: 1700-1715 SLP Time Calculation (min) (ACUTE ONLY): 15 min  Assessment / Plan / Recommendation Clinical Impression  Patient seen by SLP for skilled treatment focused on education with family (spouse and daughter) on dysphagia goals. Daughter told SLP that she has been pureeing foods at home that her mother would typically like and has found good resources online. She and patient's spouse are both aware of importance of making sure patient is alert and accepting of PO's being offered. SLP explained that patient's swallow initiation appears [facility] and concern for potential aspiration is low. SLP spent some time discussing artificial nutrition and daughter indicated that she had been hopeful that the Cortrak feeding would have given patient enough energy to then start eating. SLP advised patient's spouse and daughter to consider what patient's wishes would be in terms of prolonged artificial nutrition. They are meeting with palliative care and attending MD next date in the afternoon. SLP to s/o at this time as she appears to be on the least restrictive oral diet, expectation of ability to advance beyond puree solids is low and education with family has been completed.     HPI HPI: Amanda [name] is a 77 yo female presenting to ED 10/1 with worsening AMS and poor PO intake. Admitted with AKI and hypernatremia. Seen by SLP June 2024 with cognitively based dysphagia c/b sustained attention deficits affecting mastication and oral transit. Per notes at that time, pt orally holds foods she does not like and her husband is typically present to order desired food choices and provide careful hand feeding. PMH includes moderate dementia. Cortrak trial 10 day trial began on 10/09. Cortrak trial discontinued on 10/15. PO intake remains poor on dys 1, thin liquid  diet.      SLP Plan  Discharge SLP treatment due to (comment);Other (Comment) (no anticipation of PO texture advancement; education completed)      Recommendations for follow up therapy are one component of a multi-disciplinary discharge planning process, led by the attending physician.  Recommendations may be updated based on patient status, additional functional criteria and insurance authorization.    Recommendations                     Oral care QID;Staff/trained caregiver to provide oral care;Oral care before and after PO   Frequent or constant Supervision/Assistance Dysphagia, oral phase (R13.11)     Discharge SLP treatment due to (comment);Other (Comment) (no anticipation of PO texture advancement; education completed)    Angela Nevin, MA, CCC-SLP Speech Therapy

## 2023-02-07 DIAGNOSIS — Z7189 Other specified counseling: Secondary | ICD-10-CM | POA: Diagnosis not present

## 2023-02-07 DIAGNOSIS — Z515 Encounter for palliative care: Secondary | ICD-10-CM | POA: Diagnosis not present

## 2023-02-07 DIAGNOSIS — G9341 Metabolic encephalopathy: Secondary | ICD-10-CM | POA: Diagnosis not present

## 2023-02-07 LAB — CBC WITH DIFFERENTIAL/PLATELET
Abs Immature Granulocytes: 0.04 10*3/uL (ref 0.00–0.07)
Basophils Absolute: 0 10*3/uL (ref 0.0–0.1)
Basophils Relative: 0 %
Eosinophils Absolute: 0.1 10*3/uL (ref 0.0–0.5)
Eosinophils Relative: 2 %
HCT: 29.6 % — ABNORMAL LOW (ref 36.0–46.0)
Hemoglobin: 9.2 g/dL — ABNORMAL LOW (ref 12.0–15.0)
Immature Granulocytes: 1 %
Lymphocytes Relative: 27 %
Lymphs Abs: 1.2 10*3/uL (ref 0.7–4.0)
MCH: 25.2 pg — ABNORMAL LOW (ref 26.0–34.0)
MCHC: 31.1 g/dL (ref 30.0–36.0)
MCV: 81.1 fL (ref 80.0–100.0)
Monocytes Absolute: 0.4 10*3/uL (ref 0.1–1.0)
Monocytes Relative: 9 %
Neutro Abs: 2.7 10*3/uL (ref 1.7–7.7)
Neutrophils Relative %: 61 %
Platelets: 398 10*3/uL (ref 150–400)
RBC: 3.65 MIL/uL — ABNORMAL LOW (ref 3.87–5.11)
RDW: 15.7 % — ABNORMAL HIGH (ref 11.5–15.5)
Smear Review: NORMAL
WBC: 4.5 10*3/uL (ref 4.0–10.5)
nRBC: 0 % (ref 0.0–0.2)

## 2023-02-07 LAB — COMPREHENSIVE METABOLIC PANEL
ALT: 24 U/L (ref 0–44)
AST: 18 U/L (ref 15–41)
Albumin: 2.4 g/dL — ABNORMAL LOW (ref 3.5–5.0)
Alkaline Phosphatase: 51 U/L (ref 38–126)
Anion gap: 9 (ref 5–15)
BUN: 5 mg/dL — ABNORMAL LOW (ref 8–23)
CO2: 25 mmol/L (ref 22–32)
Calcium: 8.6 mg/dL — ABNORMAL LOW (ref 8.9–10.3)
Chloride: 107 mmol/L (ref 98–111)
Creatinine, Ser: 0.51 mg/dL (ref 0.44–1.00)
GFR, Estimated: >60 mL/min (ref >60)
Glucose, Bld: 85 mg/dL (ref 70–99)
Potassium: 3.4 mmol/L — ABNORMAL LOW (ref 3.5–5.1)
Sodium: 141 mmol/L (ref 135–145)
Total Bilirubin: 0.4 mg/dL (ref 0.3–1.2)
Total Protein: 6.7 g/dL (ref 6.5–8.1)

## 2023-02-07 LAB — GLUCOSE, CAPILLARY
Glucose-Capillary: 107 mg/dL — ABNORMAL HIGH (ref 70–99)
Glucose-Capillary: 118 mg/dL — ABNORMAL HIGH (ref 70–99)
Glucose-Capillary: 87 mg/dL (ref 70–99)
Glucose-Capillary: 87 mg/dL (ref 70–99)
Glucose-Capillary: 88 mg/dL (ref 70–99)
Glucose-Capillary: 91 mg/dL (ref 70–99)
Glucose-Capillary: 96 mg/dL (ref 70–99)

## 2023-02-07 LAB — PHOSPHORUS: Phosphorus: 3.1 mg/dL (ref 2.5–4.6)

## 2023-02-07 LAB — MAGNESIUM: Magnesium: 1.7 mg/dL (ref 1.7–2.4)

## 2023-02-07 MED ORDER — POTASSIUM CHLORIDE 20 MEQ PO PACK
40.0000 meq | PACK | Freq: Once | ORAL | Status: AC
  Administered 2023-02-07: 40 meq via ORAL
  Filled 2023-02-07: qty 2

## 2023-02-07 NOTE — TOC Progression Note (Signed)
Transition of Care [facility]) - Progression Note    Patient Details  Name: Amanda [name] MRN: [id number] Date of Birth: 1945/07/17  [facility] CM/SW Contact  Janae Bridgeman, RN Phone Number: 02/07/2023, 4:43 PM  Clinical Narrative:    CM met with attending MD, Fonnie Jarvis, NP at the bedside to discuss goals of care with the family.    The patient's family has agreed to LTC placement and patient will be faxed out in the hub for LTC.  The patient's family was given Medicare list for nursing home facilities for reference.  I also gave the patient's family brochure to Palliative care / Hospice information.    Patient will be faxed out in the area for LTC bed availability.  MSW will follow up.   Expected Discharge Plan: Skilled Nursing Facility Barriers to Discharge: Continued Medical Work up  Expected Discharge Plan and Services                                               Social Determinants of Health (SDOH) Interventions SDOH Screenings   Food Insecurity: No Food Insecurity (10/09/2022)  Housing: Patient Unable To Answer (10/18/2022)  Transportation Needs: No Transportation Needs (10/09/2022)  Utilities: Not At Risk (10/09/2022)  Depression (PHQ2-9): Low Risk  (09/14/2022)  Financial Resource Strain: Low Risk  (09/14/2022)  Physical Activity: Inactive (09/14/2022)  Stress: No Stress Concern Present (09/14/2022)  Tobacco Use: Low Risk  (01/18/2023)    Readmission Risk Interventions    02/07/2023    4:43 PM  Readmission Risk Prevention Plan  Transportation Screening Complete  PCP or Specialist Appt within 3-5 Days Complete  HRI or Home Care Consult Complete  Social Work Consult for Recovery Care Planning/Counseling Complete  Palliative Care Screening Complete  Medication Review [employer]) Complete

## 2023-02-07 NOTE — Progress Notes (Addendum)
PROGRESS NOTE    [name]  ZOX:[id number] DOB: 04-17-1946 DOA: 01/18/2023 PCP: [name], MD  Chief Complaint  Patient presents with   Altered Mental Status   Hypertension    Brief Narrative:   Amanda [name] is Amanda [name] 77 y.o. female with medical history significant of moderate dementia who presents with progressive AMS and poor PO intake.     She was admitted with AKI, hypernatremia, and AMS.  She's now s/p trial of NG tube feeding (10/9-10/15).  Hospital course complicated by refeeding syndrome and diarrhea.  She continues to have delirium with poor PO intake.  Palliative following to discuss goals of care.  Ongoing goals of care conversations.   Assessment & Plan:   Principal Problem:   Acute metabolic encephalopathy Active Problems:   Hypernatremia   SIRS (systemic inflammatory response syndrome) (HCC)   Refeeding syndrome   AKI (acute kidney injury) (HCC)   Decubitus ulcer   Dysphagia   Hypertension   Moderate dementia (HCC)   Acute deep vein thrombosis (DVT) of left lower extremity (HCC)   DNR (do not resuscitate)/DNI(Do Not Intubate)   Hypokalemia   Hypomagnesemia   Protein-calorie malnutrition, severe  Goals of Care Patient remains critically ill with persistent encephalopathy. Her overall prognosis is poor.  With improvement in her creatinine/bun, her mental status has not significantly improved from my standpoint.  Now s/p tube feeding from 10/9-10/15.  On 10/16 discussed with daughter, Amanda [name].  General impression was that she had been doing well on tube feeds.  Discussed my concerns that her PO intake is likely inadequate to meet her needs - I agree with Dr. [name] that hospice is [name] reasonable option.  Will watch how she does off IVF and follow calorie count.  Another family meeting planned for 10/21 at 3:30 PM.     Acute metabolic encephalopathy Dementia  Dementia for years.  at baseline, wheelchair bound (walked 80 ft with walker with  assistance in early September at rehab - wheelchair bound since being home), she's disoriented typically, but will communicate.   Likely multifactorial (hypernatremia, uremia/AKI, dementia) but inciting cause unclear.   No clear infectious cause (CXR without active disease, CT maxillofacial without notable findings, CT abd/pelvis without acute findings, decub doesn't appear infected) TSH wnl, ammonia wnl  B12 (elevated), folate low -> supplement EEG with moderate to severe diffuse encephalopathy, no seizures or epileptiform discharges CT head without acute abnormality MRI brain without acute abnormality  Treating hypernatremia/AKI (significantly improved) Consider additional workup/imaging - she remains altered, but has gradually improved from admission - seems like she is still far from her previous baseline, not sure she'll improve to that point again    Refeeding Syndrome Diarrhea improved Will continue to monitor with her PO intake  She removed rectal tube overnight   SIRS (systemic inflammatory response syndrome) (HCC) Initial tachycardia, WBC 17k.  Question of SIRS / Sepsis with unclear source Sepsis ruled out.  UA neg No obvious dental infection on my exam Decubitus ulcer is at least stage 2, but no obvious erythema / drainage CT maxilofacial to r/o dental abscess -> without acute findings CT abd pelvis with large stool burden, air within bladder Repeat CXR 10/3 -> without acute cardiopulm dz  Blood cultures NGx5 Urine cx no growth  Covid and RVP negative S/p ceftriaxone empirically (7 days)    Dysphagia Dysphagia 1, thin liquid per SLP 10/15 S/p NG tube 10/9-10/15  Calorie count   Hypernatremia stable   AKI  resolved   Hypoglycemia Improved past 24 hrs, will trend (decrease to ACHS for comfort)  DVT Extensive --> [name] with DVT involving L common femoral vein, SF junction, L femoral vein, L proximal profunda vein, L popliteal vein, L posterior tibial veins, and L  peroneal veins eliquis    Hypokalemia Hypomagnesemia Replace and follow    Elevated Lipase Unclear significance - CT without findings c/w pancreatitis, not significantly tender on exam Trend -> resolved   Decubitus ulcer Wound care consult -> appreciate recs   Hypertension Chart diagnosis, doesn't appear to be on any chronic meds for this. BP appropriate without meds    DVT prophylaxis: eliquis Code Status: DNR Family Communication: husband at bedside 10/19  Disposition:   Status is: Inpatient Remains inpatient appropriate because: need for continued inpatient care   Consultants:  palliative  Procedures:  10/4 IMPRESSION: This study is suggestive of moderate to severe diffuse encephalopathy. No seizures or epileptiform discharges were seen throughout the recording.  10/9  Cortrak    Antimicrobials:  Anti-infectives (From admission, onward)    Start     Dose/Rate Route Frequency Ordered Stop   01/19/23 1900  ceFEPIme (MAXIPIME) 2 g in sodium chloride 0.9 % 100 mL IVPB  Status:  Discontinued        2 g 200 mL/hr over 30 Minutes Intravenous Every 24 hours 01/19/23 0338 01/19/23 0945   01/19/23 1000  cefTRIAXone (ROCEPHIN) 2 g in sodium chloride 0.9 % 100 mL IVPB       Note to Pharmacy: Retime as indicated   2 g 200 mL/hr over 30 Minutes Intravenous Every 24 hours 01/19/23 0945 01/25/23 1325   01/19/23 0342  vancomycin variable dose per unstable renal function (pharmacist dosing)  Status:  Discontinued         Does not apply See admin instructions 01/19/23 0343 01/20/23 0718   01/18/23 1830  vancomycin (VANCOREADY) IVPB 1250 mg/250 mL        1,250 mg 166.7 mL/hr over 90 Minutes Intravenous  Once 01/18/23 1818 01/18/23 2122   01/18/23 1815  ceFEPIme (MAXIPIME) 2 g in sodium chloride 0.9 % 100 mL IVPB        2 g 200 mL/hr over 30 Minutes Intravenous  Once 01/18/23 1807 01/18/23 1911       Subjective: Answers some simple questions with one word  answers  Objective: Vitals:   02/06/23 2128 02/07/23 0008 02/07/23 0525 02/07/23 0730  BP: 103/85 117/63 (!) 142/86 (!) 153/63  Pulse: 99 93 98 97  Resp: 18 18 18 17   Temp: 98.9 F (37.2 C) 98.2 F (36.8 C) 98.1 F (36.7 C) 97.7 F (36.5 C)  TempSrc: Oral Oral Oral Oral  SpO2: 97% 99% 100% 100%  Weight:       No intake or output data in the 24 hours ending 02/07/23 0845  Filed Weights   01/31/23 0649 01/31/23 1800 02/03/23 0500  Weight: 85 kg 50 kg 50 kg    Examination:  General: frail Cardiovascular: RRR Lungs: unlabored Neurological: Alert, moving all extremities Skin: Warm and dry.    Data Reviewed: I have personally reviewed following labs and imaging studies  CBC: Recent Labs  Lab 02/02/23 0453 02/03/23 0552 02/04/23 1356 02/05/23 0703 02/06/23 0734 02/07/23 0703  WBC 7.8 5.3 5.7 4.8 4.9 4.5  NEUTROABS 6.0 3.8 4.1 3.7  --  PENDING  HGB 8.4* 9.0* 9.0* 8.7* 9.2* 9.2*  HCT 26.1* 28.6* 29.3* 28.2* 29.6* 29.6*  MCV 81.1 82.4 83.5 81.5 80.7  81.1  PLT 294 257 328 358 340 398    Basic Metabolic Panel: Recent Labs  Lab 02/03/23 0552 02/04/23 1356 02/05/23 0703 02/06/23 0734 02/07/23 0703  NA 138 141 140 140 141  K 4.1 3.6 3.4* 3.9 3.4*  CL 104 105 108 107 107  CO2 26 24 22 22 25   GLUCOSE 92 80 78 97 85  BUN 7* <5* 5* <5* 5*  CREATININE 0.58 0.49 0.48 0.50 0.51  CALCIUM 8.6* 8.8* 8.5* 8.3* 8.6*  MG 1.8 1.8 1.7 1.6* 1.7  PHOS 3.7 3.2 3.3 3.0 3.1    GFR: Estimated Creatinine Clearance: 46.5 mL/min (by C-G formula based on SCr of 0.51 mg/dL).  Liver Function Tests: Recent Labs  Lab 02/02/23 0453 02/03/23 0552 02/04/23 1356 02/05/23 0703 02/07/23 0703  AST 47* 42* 26 23 18   ALT 53* 49* 36 30 24  ALKPHOS 59 53 55 46 51  BILITOT 0.4 <0.1* 0.7 0.4 0.4  PROT 6.3* 6.6 6.6 6.6 6.7  ALBUMIN 2.1* 2.2* 2.4* 2.4* 2.4*    CBG: Recent Labs  Lab 02/06/23 1137 02/06/23 1545 02/06/23 2127 02/07/23 0007 02/07/23 0523  GLUCAP 83 106* 91 87 96      No results found for this or any previous visit (from the past 240 hour(s)).       Radiology Studies: No results found.      Scheduled Meds:  apixaban  5 mg Oral BID   feeding supplement  237 mL Oral BID BM   folic acid  1 mg Oral Daily   leptospermum manuka honey  1 Application Topical Daily   potassium chloride  40 mEq Oral Once   Continuous Infusions:     LOS: 20 days    Time spent: over 30 min     Lacretia Nicks, MD [facility]   To contact the attending provider between 7A-7P or the covering provider during after hours 7P-7A, please log into the web site [email or web address] and access using universal [place] password for that web site. If you do not have the password, please call the hospital operator.  02/07/2023, 8:45 AM

## 2023-02-07 NOTE — Plan of Care (Signed)

## 2023-02-07 NOTE — Plan of Care (Signed)
A/Ox1 to self only. On room air. No complaints or signs of pain this shift. Total care provided. Q4 BG stable over night. Continues to have poor oral intake, ate about 10% of dinner with family encouragement. Q2 turns but favors right side heavily no matter how she is turned.    Problem: Education: Goal: Ability to describe self-care measures that may prevent or decrease complications (Diabetes Survival Skills Education) will improve Outcome: Not Progressing Goal: Individualized Educational Video(s) Outcome: Not Progressing   Problem: Coping: Goal: Ability to adjust to condition or change in health will improve Outcome: Not Progressing   Problem: Fluid Volume: Goal: Ability to maintain a balanced intake and output will improve Outcome: Not Progressing   Problem: Health Behavior/Discharge Planning: Goal: Ability to identify and utilize available resources and services will improve Outcome: Not Progressing Goal: Ability to manage health-related needs will improve Outcome: Not Progressing   Problem: Metabolic: Goal: Ability to maintain appropriate glucose levels will improve Outcome: Not Progressing   Problem: Nutritional: Goal: Maintenance of adequate nutrition will improve Outcome: Not Progressing Goal: Progress toward achieving an optimal weight will improve Outcome: Not Progressing   Problem: Skin Integrity: Goal: Risk for impaired skin integrity will decrease Outcome: Not Progressing   Problem: Tissue Perfusion: Goal: Adequacy of tissue perfusion will improve Outcome: Not Progressing   Problem: Education: Goal: Knowledge of General Education information will improve Description: Including pain rating scale, medication(s)/side effects and non-pharmacologic comfort measures Outcome: Not Progressing   Problem: Health Behavior/Discharge Planning: Goal: Ability to manage health-related needs will improve Outcome: Not Progressing   Problem: Clinical Measurements: Goal:  Ability to maintain clinical measurements within normal limits will improve Outcome: Not Progressing Goal: Will remain free from infection Outcome: Not Progressing Goal: Diagnostic test results will improve Outcome: Not Progressing Goal: Respiratory complications will improve Outcome: Not Progressing Goal: Cardiovascular complication will be avoided Outcome: Not Progressing   Problem: Activity: Goal: Risk for activity intolerance will decrease Outcome: Not Progressing   Problem: Nutrition: Goal: Adequate nutrition will be maintained Outcome: Not Progressing   Problem: Coping: Goal: Level of anxiety will decrease Outcome: Not Progressing   Problem: Elimination: Goal: Will not experience complications related to bowel motility Outcome: Not Progressing Goal: Will not experience complications related to urinary retention Outcome: Not Progressing   Problem: Pain Managment: Goal: General experience of comfort will improve Outcome: Not Progressing   Problem: Safety: Goal: Ability to remain free from injury will improve Outcome: Not Progressing   Problem: Skin Integrity: Goal: Risk for impaired skin integrity will decrease Outcome: Not Progressing

## 2023-02-07 NOTE — Progress Notes (Signed)
   Palliative Medicine Inpatient Follow Up Note HPI: 77 y.o. female  with past medical history of moderate dementia admitted on 01/18/2023 with AMS and poor PO intake. She was found to have AKI and hypernatremia.   Palliative care has been asked to get involved to further assist with goals of care conversations.   Today's Discussion 02/07/2023  *Please note that this is a verbal dictation therefore any spelling or grammatical errors are due to the "Dragon Medical One" system interpretation.  Chart reviewed inclusive of vital signs, progress notes, laboratory results, and diagnostic images.   I met at bedside with Amanda [name] this morning. She is resting comfortably in NAD.  ________________________________ Addendum:  A family meeting was held this afternoon with patients spouse, Amanda [name], daughter, Amanda [name], Dr. Lowell Guitar, and Flora Lipps.   Dr. Lowell Guitar discussed patients recent caloric intake. He shared that Amanda [name] is eating roughly 20% of her caloric needs. Patients spouse does note that her appetite today has improved. He shares that some days are better than others though she does oscillate from day to day.  Dr. Lowell Guitar shared that it would be difficult for Amanda [name] to care for Amanda [name] as she is now with her wound, limited mobility, and overall total needs. He recommended considering a long term facility to meet patients daily needs. Discussed what that would look like with TOC. TOC shares that Amanda [name] would need to have inquiries sent to local SNF's to verify where patient may be able to go long term. Discussed that OP Palliative and Hospice could follow in these facilities.  Reviewed why OP Palliative support would be of benefit for Amanda [name].   Discussed MOST form and why this is important to complete prior to discharge.   Goals at this time are for long term placement.  Amanda [name] and his daughter are able to meet on Friday at [name].   Objective Assessment: Vital Signs Vitals:    02/07/23 0730 02/07/23 1536  BP: (!) 153/63 (!) 118/56  Pulse: 97 (!) 102  Resp: 17 17  Temp: 97.7 F (36.5 C)   SpO2: 100% 100%   No intake or output data in the 24 hours ending 02/07/23 1637  Last Weight  Most recent update: 02/03/2023  6:59 AM    Weight  50 kg (110 lb 3.7 oz)            Gen:  Frail Elderly AA F chronically ill in appearance HEENT:  Dry mucous membranes CV: Regular rate and rhythm  PULM:  On RA, breathing is even and nonlabored ABD: soft/nontender  EXT: (+) muscle wasting (-) edema  Neuro: Eyes closed, arouses   SUMMARY OF RECOMMENDATIONS   DNAR/DNI  Open and honest conversations held in the setting of patients disease burden   Long Term care placement  Information provided on Palliative and Hospice care  Ongoing PMT support   Time - 21 ______________________________________________________________________________________ Amanda [name] Palliative Medicine Team Team Cell Phone: [phone] Please utilize secure chat with additional questions, if there is no response within 30 minutes please call the above phone number  Palliative Medicine Team providers are available by phone from 7am to 7pm daily and can be reached through the team cell phone.  Should this patient require assistance outside of these hours, please call the patient's attending physician.

## 2023-02-07 NOTE — Progress Notes (Signed)
Calorie Count Note day 1-3 Pt continues to have poor oral intake. PEG placement not a option at this time per MD. Will discontinue calorie count.   72 hour calorie count ordered.  Day-1  Diet: Dys-1 Supplements: Ensure BID, Magic cup, Mighty shake  Breakfast: 34.17 kcal, 2 g pro Lunch: 0 Dinner: 35.5 kcal, 1.09 g pro Supplements: 159.5 kcal 4.5 g pro  Total intake: 236 kcal (17% of minimum estimated needs)  7.59 protein (10% of minimum estimated needs)  Day -2  Breakfast: no documentation Lunch: 99.73 kcal, 6.17 g pro Dinner: 165 kcal,  5.3 g pro Supplements: 145 kcal, 4.5 g pro  Total intake: 410 kcal (29% of minimum estimated needs)  15.7 g protein (22% of minimum estimated needs)  Day -3  Breakfast: n/a Lunch: 15 kcal, 0 g pro Dinner: 127 kcal 3 g pro Supplements: 290kca, 9g pro  Total intake: 432 kcal (31% of minimum estimated needs)  12 g protein (17% of minimum estimated needs)  Nutrition Dx:  Severe Malnutrition related to chronic illness (moderate dementia) as evidenced by severe fat depletion, severe muscle depletion.  Goal: Provide adequate nutrition.    Intervention: Shakes breakfast and dinner, Ensure BID.  MD assess for appetite stimulants.     Jamelle Haring RDN, LDN Clinical Dietitian  RDN pager # available on Amion

## 2023-02-08 DIAGNOSIS — G9341 Metabolic encephalopathy: Secondary | ICD-10-CM | POA: Diagnosis not present

## 2023-02-08 LAB — GLUCOSE, CAPILLARY
Glucose-Capillary: 88 mg/dL (ref 70–99)
Glucose-Capillary: 83 mg/dL (ref 70–99)
Glucose-Capillary: 98 mg/dL (ref 70–99)
Glucose-Capillary: 87 mg/dL (ref 70–99)

## 2023-02-08 NOTE — Progress Notes (Addendum)
PROGRESS NOTE    Amanda [name]  YNW:[id number] DOB: 11/28/1945 DOA: 01/18/2023 PCP: [name], MD  Chief Complaint  Patient presents with   Altered Mental Status   Hypertension    Brief Narrative:   Amanda [name] is Amanda [name] 77 y.o. female with medical history significant of moderate dementia who presents with progressive AMS and poor PO intake.     She was admitted with AKI, hypernatremia, and AMS.  She's now s/p trial of NG tube feeding (10/9-10/15).  Hospital course complicated by refeeding syndrome and diarrhea.    She continues to have delirium with poor PO intake.  Plan at this point is discharge to long term care facility once Amanda [name] bed is available.    Assessment & Plan:   Principal Problem:   Acute metabolic encephalopathy Active Problems:   Hypernatremia   SIRS (systemic inflammatory response syndrome) (HCC)   Refeeding syndrome   AKI (acute kidney injury) (HCC)   Decubitus ulcer   Dysphagia   Hypertension   Moderate dementia (HCC)   Acute deep vein thrombosis (DVT) of left lower extremity (HCC)   DNR (do not resuscitate)/DNI(Do Not Intubate)   Hypokalemia   Hypomagnesemia   Protein-calorie malnutrition, severe  Goals of Care Patient remains with persistent encephalopathy. Her overall prognosis is poor.  With improvement in her creatinine/bun, her mental status has not significantly improved from my standpoint.  She's now s/p tube feeding from 10/9-10/15.  On 10/16 discussed with daughter, Amanda [name].  General impression was that she had been doing well on tube feeds.  Discussed my concerns that her PO intake is likely inadequate to meet her needs - I agree with Dr. [name] that hospice is Amanda [name] reasonable option (though family isn't ready to pursue this at this time).  The family has been educated regarding hospice and palliative care with the assistance of the palliative care team.  She's maintained her hydration over the weekend without supplemental IV fluids.   Though not meeting her caloric needs PO, her husband isn't interested in PEG.  I think appropriate and stable for discharge.  At this point, looking at long term care options.    Acute metabolic encephalopathy Dementia  Dementia for years.  at baseline, wheelchair bound (walked 80 ft with walker with assistance in early September at rehab - wheelchair bound since being home), she's disoriented typically, but will communicate.   Likely multifactorial (hypernatremia, uremia/AKI, dementia) but inciting cause unclear.   No clear infectious cause (CXR without active disease, CT maxillofacial without notable findings, CT abd/pelvis without acute findings, decub doesn't appear infected) TSH wnl, ammonia wnl  B12 (elevated), folate low -> supplement EEG with moderate to severe diffuse encephalopathy, no seizures or epileptiform discharges CT head without acute abnormality MRI brain without acute abnormality  Treating hypernatremia/AKI (significantly improved) Consider additional workup/imaging - she remains altered, but has gradually improved from admission - seems like she is still far from her previous baseline, I doubt she'll improved to that point again.   SIRS (systemic inflammatory response syndrome) (HCC) Initial tachycardia, WBC 17k.  Question of SIRS / Sepsis with unclear source Sepsis ruled out.  UA neg No obvious dental infection on my exam Decubitus ulcer is at least stage 2, but no obvious erythema / drainage CT maxilofacial to r/o dental abscess -> without acute findings CT abd pelvis with large stool burden, air within bladder Repeat CXR 10/3 -> without acute cardiopulm dz  Blood cultures NGx5 Urine cx no growth  Covid  and RVP negative S/p ceftriaxone empirically (7 days)    Dysphagia Dysphagia 1, thin liquid per SLP 10/15 S/p NG tube 10/9-10/15  Calorie count -> see 10/21 RD note, not meeting caloric needs   Refeeding Syndrome Diarrhea improved after rectal tube  removed Will continue to monitor with her PO intake  She removed rectal tube overnight   Hypernatremia Stable -> her husband asked me about dehydration today - we had discussion that she's at high risk for dehydration as she's completely reliant on Amanda [name] to offer her food/drink.  After discharge, she should be offered water throughout the day while she's at the SNF to help avoid dehydration.   Will check BMP again tomorrow, will defer additional checks to oncoming provider.    AKI  resolved   Hypoglycemia Improved past 2 days or so  DVT Extensive --> Amanda [name] with DVT involving L common femoral vein, SF junction, L femoral vein, L proximal profunda vein, L popliteal vein, L posterior tibial veins, and L peroneal veins eliquis    Hypokalemia Hypomagnesemia Replace and follow    Elevated Lipase Unclear significance - CT without findings c/w pancreatitis, not significantly tender on exam Trend -> resolved   Decubitus ulcer Wound care consult -> appreciate recs   Hypertension Chart diagnosis, doesn't appear to be on any chronic meds for this. BP appropriate without meds    DVT prophylaxis: eliquis Code Status: DNR Family Communication: husband at bedside 10/22 Disposition:   Status is: Inpatient Remains inpatient appropriate because: need for continued inpatient care   Consultants:  palliative  Procedures:  10/4 IMPRESSION: This study is suggestive of moderate to severe diffuse encephalopathy. No seizures or epileptiform discharges were seen throughout the recording.  10/9  Cortrak    Antimicrobials:  Anti-infectives (From admission, onward)    Start     Dose/Rate Route Frequency Ordered Stop   01/19/23 1900  ceFEPIme (MAXIPIME) 2 g in sodium chloride 0.9 % 100 mL IVPB  Status:  Discontinued        2 g 200 mL/hr over 30 Minutes Intravenous Every 24 hours 01/19/23 0338 01/19/23 0945   01/19/23 1000  cefTRIAXone (ROCEPHIN) 2 g in sodium chloride 0.9 % 100 mL IVPB        Note to Pharmacy: Retime as indicated   2 g 200 mL/hr over 30 Minutes Intravenous Every 24 hours 01/19/23 0945 01/25/23 1325   01/19/23 0342  vancomycin variable dose per unstable renal function (pharmacist dosing)  Status:  Discontinued         Does not apply See admin instructions 01/19/23 0343 01/20/23 0718   01/18/23 1830  vancomycin (VANCOREADY) IVPB 1250 mg/250 mL        1,250 mg 166.7 mL/hr over 90 Minutes Intravenous  Once 01/18/23 1818 01/18/23 2122   01/18/23 1815  ceFEPIme (MAXIPIME) 2 g in sodium chloride 0.9 % 100 mL IVPB        2 g 200 mL/hr over 30 Minutes Intravenous  Once 01/18/23 1807 01/18/23 1911       Subjective: Doesn't say anything today Her husband at bedside feeding ehr   Objective: Vitals:   02/08/23 0338 02/08/23 0500 02/08/23 0744 02/08/23 1330  BP: (!) 126/56  (!) 116/52 125/76  Pulse: (!) 105  95 89  Resp: 18  16 16   Temp: 98.2 F (36.8 C)  98 F (36.7 C) 98.2 F (36.8 C)  TempSrc:    Oral  SpO2: 100%  100% 94%  Weight:  50 kg  Intake/Output Summary (Last 24 hours) at 02/08/2023 1604 Last data filed at 02/07/2023 2200 Gross per 24 hour  Intake 50 ml  Output --  Net 50 ml    Filed Weights   01/31/23 1800 02/03/23 0500 02/08/23 0500  Weight: 50 kg 50 kg 50 kg    Examination:  General: No acute distress Lungs: unlabored Neurological: awake, confused, occasionally looks up at me - doesn't say anything  Data Reviewed: I have personally reviewed following labs and imaging studies  CBC: Recent Labs  Lab 02/02/23 0453 02/03/23 0552 02/04/23 1356 02/05/23 0703 02/06/23 0734 02/07/23 0703  WBC 7.8 5.3 5.7 4.8 4.9 4.5  NEUTROABS 6.0 3.8 4.1 3.7  --  2.7  HGB 8.4* 9.0* 9.0* 8.7* 9.2* 9.2*  HCT 26.1* 28.6* 29.3* 28.2* 29.6* 29.6*  MCV 81.1 82.4 83.5 81.5 80.7 81.1  PLT 294 257 328 358 340 398    Basic Metabolic Panel: Recent Labs  Lab 02/03/23 0552 02/04/23 1356 02/05/23 0703 02/06/23 0734 02/07/23 0703  NA 138 141  140 140 141  K 4.1 3.6 3.4* 3.9 3.4*  CL 104 105 108 107 107  CO2 26 24 22 22 25   GLUCOSE 92 80 78 97 85  BUN 7* <5* 5* <5* 5*  CREATININE 0.58 0.49 0.48 0.50 0.51  CALCIUM 8.6* 8.8* 8.5* 8.3* 8.6*  MG 1.8 1.8 1.7 1.6* 1.7  PHOS 3.7 3.2 3.3 3.0 3.1    GFR: Estimated Creatinine Clearance: 46.5 mL/min (by C-G formula based on SCr of 0.51 mg/dL).  Liver Function Tests: Recent Labs  Lab 02/02/23 0453 02/03/23 0552 02/04/23 1356 02/05/23 0703 02/07/23 0703  AST 47* 42* 26 23 18   ALT 53* 49* 36 30 24  ALKPHOS 59 53 55 46 51  BILITOT 0.4 <0.1* 0.7 0.4 0.4  PROT 6.3* 6.6 6.6 6.6 6.7  ALBUMIN 2.1* 2.2* 2.4* 2.4* 2.4*    CBG: Recent Labs  Lab 02/07/23 1201 02/07/23 1643 02/07/23 1949 02/08/23 0743 02/08/23 1151  GLUCAP 87 118* 107* 88 83     No results found for this or any previous visit (from the past 240 hour(s)).       Radiology Studies: No results found.      Scheduled Meds:  apixaban  5 mg Oral BID   feeding supplement  237 mL Oral BID BM   folic acid  1 mg Oral Daily   leptospermum manuka honey  1 Application Topical Daily   potassium chloride  40 mEq Oral Once   Continuous Infusions:     LOS: 21 days    Time spent: over 30 min     Lacretia Nicks, MD [facility]   To contact the attending provider between 7A-7P or the covering provider during after hours 7P-7A, please log into the web site [email or web address] and access using universal [place] password for that web site. If you do not have the password, please call the hospital operator.  02/08/2023, 4:04 PM

## 2023-02-08 NOTE — NC FL2 (Signed)
[place] MEDICAID FL2 LEVEL OF CARE FORM     IDENTIFICATION  Patient Name: Amanda [name] Birthdate: 02/24/1946 Sex: female Admission Date (Current Location): 01/18/2023  [facility] and [place] Number:  Producer, television/film/video and Address:  The . [facility], [address], [place], [place] [address]      Provider Number: [id number]  Attending Physician Name and Address:  Zigmund Daniel., *  Relative Name and Phone Number:  Zonia, Horwedel (Spouse)  [phone]    Current Level of Care: Hospital Recommended Level of Care: Other (Comment), Skilled Nursing Facility (Long Term Care) Prior Approval Number:    Date Approved/Denied:   PASRR Number: [id number]  Discharge Plan: Other (Comment) (Long Term Care)    Current Diagnoses: Patient Active Problem List   Diagnosis Date Noted   Refeeding syndrome 01/27/2023   Acute deep vein thrombosis (DVT) of left lower extremity (HCC) 01/26/2023   DNR (do not resuscitate)/DNI(Do Not Intubate) 01/26/2023   Dysphagia 01/26/2023   Hypokalemia 01/26/2023   Hypomagnesemia 01/26/2023   Protein-calorie malnutrition, severe 01/26/2023   Hypernatremia 01/18/2023   SIRS (systemic inflammatory response syndrome) (HCC) 01/18/2023   Decubitus ulcer 01/18/2023   Acute metabolic encephalopathy 10/09/2022   AKI (acute kidney injury) (HCC) 10/08/2022   Moderate dementia (HCC) 10/02/2014   Hyperlipidemia with target low density lipoprotein (LDL) cholesterol less than 130 mg/dL 75/91/6384   Hypertension 06/12/2012    Orientation RESPIRATION BLADDER Height & Weight     Self  Normal Incontinent Weight: 110 lb 3.7 oz (50 kg) Height:     BEHAVIORAL SYMPTOMS/MOOD NEUROLOGICAL BOWEL NUTRITION STATUS      Incontinent Diet (see discharge summary)  AMBULATORY STATUS COMMUNICATION OF NEEDS Skin   Extensive Assist Verbally (incomprehensible) Other (Comment), PU Stage and Appropriate Care (Pressure Injury Sacrum Medial  Unstageable - Full thickness tissue loss. Wound / Incision (Open or Dehisced) 01/30/23 Other (Comment) Foot Anterior;Right Thigh Posterior, Right Stage 2 - Partial thickness loss of dermis presenting as a shallow open injury)   PU Stage 2 Dressing: Daily                   Personal Care Assistance Level of Assistance  Bathing, Feeding, Dressing Bathing Assistance: Maximum assistance Feeding assistance: Limited assistance Dressing Assistance: Maximum assistance     Functional Limitations Info  Hearing, Speech, Sight Sight Info: Impaired Hearing Info: Adequate Speech Info: Impaired (difficulty speaking)    SPECIAL CARE FACTORS FREQUENCY                       Contractures Contractures Info: Not present    Additional Factors Info  Code Status, Allergies Code Status Info: DNR Allergies Info: No Known Allergies           Current Medications (02/08/2023):  This is the current hospital active medication list Current Facility-Administered Medications  Medication Dose Route Frequency Provider Last Rate Last Admin   acetaminophen (TYLENOL) 160 MG/5ML solution 650 mg  650 mg Oral Q6H PRN Carollee Herter, DO   650 mg at 02/03/23 0930   apixaban (ELIQUIS) tablet 5 mg  5 mg Oral BID Pham, Minh Q, RPH-CPP   5 mg at 02/08/23 0842   feeding supplement (ENSURE ENLIVE / ENSURE PLUS) liquid 237 mL  237 mL Oral BID BM Carollee Herter, DO   237 mL at 02/07/23 1459   folic acid (FOLVITE) tablet 1 mg  1 mg Oral Daily Zigmund Daniel., MD   1  mg at 02/08/23 0842   leptospermum manuka honey (MEDIHONEY) paste 1 Application  1 Application Topical Daily Carollee Herter, DO   1 Application at 02/08/23 0842   ondansetron (ZOFRAN) tablet 4 mg  4 mg Oral Q6H PRN Hillary Bow, DO       Or   ondansetron [facility]) injection 4 mg  4 mg Intravenous Q6H PRN Hillary Bow, DO       Oral care mouth rinse  15 mL Mouth Rinse PRN Zigmund Daniel., MD       potassium chloride (KLOR-CON) packet 40 mEq  40  mEq Oral Once Zigmund Daniel., MD         Discharge Medications: Please see discharge summary for a list of discharge medications.  Relevant Imaging Results:  Relevant Lab Results:   Additional Information SSN; [id number], Long Term Care  [place], [place]

## 2023-02-08 NOTE — TOC Progression Note (Signed)
Transition of Care [name]) - Progression Note    Patient Details  Name: Amanda [name] MRN: [id number] Date of Birth: 1945/12/19  Transition of Care [facility]) CM/SW Contact  Victor Granados A [place], [place] Phone Number: 02/08/2023, 4:29 PM  Clinical Narrative:     CSW met with pt's spouse, [name] and explained long term care versus short term rehab at discharge. CSW provided him with a list of SNF in the local area.   He stated there was still possibility for consideration at an assisted living facility with 24/7 custodial care as well. He said he would discuss options with his son and daughter as things progressed.   CSW sent referrals to SNF's in the local area as requested. CSW to follow up with bed offers and proceed with discharge planning with pt and pt's family.   TOC will continue to follow.    Expected Discharge Plan: Skilled Nursing Facility Barriers to Discharge: Continued Medical Work up  Expected Discharge Plan and Services                                               Social Determinants of Health (SDOH) Interventions SDOH Screenings   Food Insecurity: No Food Insecurity (10/09/2022)  Housing: Patient Unable To Answer (10/18/2022)  Transportation Needs: No Transportation Needs (10/09/2022)  Utilities: Not At Risk (10/09/2022)  Depression (PHQ2-9): Low Risk  (09/14/2022)  Financial Resource Strain: Low Risk  (09/14/2022)  Physical Activity: Inactive (09/14/2022)  Stress: No Stress Concern Present (09/14/2022)  Tobacco Use: Low Risk  (01/18/2023)    Readmission Risk Interventions    02/07/2023    4:43 PM  Readmission Risk Prevention Plan  Transportation Screening Complete  PCP or Specialist Appt within 3-5 Days Complete  HRI or Home Care Consult Complete  Social Work Consult for Recovery Care Planning/Counseling Complete  Palliative Care Screening Complete  Medication Review Oceanographer) Complete

## 2023-02-08 NOTE — Progress Notes (Signed)
Physical Therapy Treatment Patient Details Name: Amanda [name] MRN: [id number] DOB: Jun 15, 1945 Today's Date: 02/08/2023   History of Present Illness Pt is a 77 year old woman admitted on 10/1 with AMS, poor PO intake, SIRS, hypernatremia, AKI secondary to dehydration. Pt + for DVT. Required cortrak due to dysphagia, now discontinued. PMH: dementia, sacral wound, PNA and UTI in June 2024.    PT Comments  Pt seen for PT tx with pt received in bed, asleep but easily awakens & focuses on PT but does not verbally engage during session. Pt requires total assist for bed mobility, max assist for static sitting EOB but pt sliding off EOB & PT unable to scoot buttocks back so PT assisted pt back to bed. PT & NT assisted with repositioning pt in L sidelying, as pt received in R sidelying. Pt with baseline cognitive deficits, pt intermittently swatting at staff during session but suspect this is because of confusion/decreased awareness. Will continue to follow pt acutely to progress mobility as able.    If plan is discharge home, recommend the following: Two people to help with walking and/or transfers;Two people to help with bathing/dressing/bathroom;Assistance with feeding;Assistance with cooking/housework   Can travel by private vehicle     No  Equipment Recommendations  None recommended by PT    Recommendations for Other Services       Precautions / Restrictions Precautions Precautions: Fall Restrictions Weight Bearing Restrictions: No     Mobility  Bed Mobility Overal bed mobility: Needs Assistance Bed Mobility: Rolling, Supine to Sit Rolling: Total assist, +2 for physical assistance, Used rails   Supine to sit: Total assist, Used rails, HOB elevated Sit to supine: Total assist, Used rails, HOB elevated        Transfers                        Ambulation/Gait                   Stairs             Wheelchair Mobility     Tilt Bed    Modified  Rankin (Stroke Patients Only)       Balance Overall balance assessment: Needs assistance Sitting-balance support: Feet supported, Bilateral upper extremity supported Sitting balance-Leahy Scale: Poor                                      Cognition Arousal: Alert Behavior During Therapy: Flat affect Overall Cognitive Status: History of cognitive impairments - at baseline                                 General Comments: pt non verbal, does not follow simple commands even with multimodal cuing & extra time        Exercises      General Comments        Pertinent Vitals/Pain Pain Assessment Pain Assessment: PAINAD Breathing: normal Negative Vocalization: none Facial Expression: facial grimacing Body Language: rigid, fists clenched, knees up, pushing/pulling away, strikes out Consolability: distracted or reassured by voice/touch PAINAD Score: 5    Home Living                          Prior Function  PT Goals (current goals can now be found in the care plan section) Acute Rehab PT Goals Patient Stated Goal: Pt unable to state PT Goal Formulation: With family Time For Goal Achievement: 02/16/23 Potential to Achieve Goals: Poor Progress towards PT goals: PT to reassess next treatment    Frequency    Min 1X/week      PT Plan      Co-evaluation              AM-PAC PT "6 Clicks" Mobility   Outcome Measure  Help needed turning from your back to your side while in a flat bed without using bedrails?: Total Help needed moving from lying on your back to sitting on the side of a flat bed without using bedrails?: Total Help needed moving to and from a bed to a chair (including a wheelchair)?: Total Help needed standing up from a chair using your arms (e.g., wheelchair or bedside chair)?: Total Help needed to walk in hospital room?: Total Help needed climbing 3-5 steps with a railing? : Total 6 Click Score:  6    End of Session   Activity Tolerance: Patient tolerated treatment well Patient left: in bed;with call bell/phone within reach;with bed alarm set   PT Visit Diagnosis: Other abnormalities of gait and mobility (R26.89);Muscle weakness (generalized) (M62.81)     Time: 1478-2956 PT Time Calculation (min) (ACUTE ONLY): 9 min  Charges:    $Therapeutic Activity: 8-22 mins PT General Charges $$ ACUTE PT VISIT: 1 Visit                     Aleda Grana, PT, DPT 02/08/23, 2:44 PM    Sandi Mariscal 02/08/2023, 2:43 PM

## 2023-02-09 DIAGNOSIS — Z515 Encounter for palliative care: Secondary | ICD-10-CM | POA: Diagnosis not present

## 2023-02-09 DIAGNOSIS — G9341 Metabolic encephalopathy: Secondary | ICD-10-CM | POA: Diagnosis not present

## 2023-02-09 LAB — BASIC METABOLIC PANEL
Anion gap: 9 (ref 5–15)
BUN: 7 mg/dL — ABNORMAL LOW (ref 8–23)
CO2: 24 mmol/L (ref 22–32)
Calcium: 8.4 mg/dL — ABNORMAL LOW (ref 8.9–10.3)
Chloride: 106 mmol/L (ref 98–111)
Creatinine, Ser: 0.53 mg/dL (ref 0.44–1.00)
GFR, Estimated: >60 mL/min (ref >60)
Glucose, Bld: 106 mg/dL — ABNORMAL HIGH (ref 70–99)
Potassium: 3.2 mmol/L — ABNORMAL LOW (ref 3.5–5.1)
Sodium: 139 mmol/L (ref 135–145)

## 2023-02-09 LAB — GLUCOSE, CAPILLARY
Glucose-Capillary: 95 mg/dL (ref 70–99)
Glucose-Capillary: 86 mg/dL (ref 70–99)
Glucose-Capillary: 103 mg/dL — ABNORMAL HIGH (ref 70–99)
Glucose-Capillary: 123 mg/dL — ABNORMAL HIGH (ref 70–99)

## 2023-02-09 MED ORDER — POTASSIUM CHLORIDE 20 MEQ PO PACK
40.0000 meq | PACK | Freq: Two times a day (BID) | ORAL | Status: DC
  Administered 2023-02-09 – 2023-02-14 (×12): 40 meq via ORAL
  Filled 2023-02-09 (×12): qty 2

## 2023-02-09 NOTE — Progress Notes (Signed)
   Palliative Medicine Inpatient Follow Up Note HPI: 77 y.o. female  with past medical history of moderate dementia admitted on 01/18/2023 with AMS and poor PO intake. She was found to have AKI and hypernatremia.   Palliative care has been asked to get involved to further assist with goals of care conversations.   Today's Discussion 02/09/2023  *Please note that this is a verbal dictation therefore any spelling or grammatical errors are due to the "Dragon [employer] One" system interpretation.  Chart reviewed inclusive of vital signs, progress notes, laboratory results, and diagnostic images.   I met at bedside with Amanda [name] this morning she was on her left side hugging herself with mittens on. She did not appear distressed.   Plan for placement - long term. The MSW term is working with patients family regarding this.   Objective Assessment: Vital Signs Vitals:   02/09/23 0444 02/09/23 0745  BP: 124/76 104/72  Pulse: (!) 103 94  Resp: 16 17  Temp: 98.3 F (36.8 C) 98.2 F (36.8 C)  SpO2: 100% 100%   No intake or output data in the 24 hours ending 02/09/23 1126  Last Weight  Most recent update: 02/08/2023  8:00 AM    Weight  50 kg (110 lb 3.7 oz)            Gen:  Frail Elderly AA F chronically ill in appearance HEENT:  Dry mucous membranes CV: Regular rate and rhythm  PULM:  On RA, breathing is even and nonlabored ABD: soft/nontender  EXT: (+) muscle wasting (-) edema  Neuro: Eyes closed, arouses   SUMMARY OF RECOMMENDATIONS   DNAR/DNI  Long Term care placement with outpatient palliative support  Plan for peripheral PMT support  Time - 25 ______________________________________________________________________________________ Lamarr Lulas [place] Palliative Medicine Team Team Cell Phone: [phone] Please utilize secure chat with additional questions, if there is no response within 30 minutes please call the above phone number  Palliative Medicine  Team providers are available by phone from 7am to 7pm daily and can be reached through the team cell phone.  Should this patient require assistance outside of these hours, please call the patient's attending physician.
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
## 2023-02-09 NOTE — Plan of Care (Signed)

## 2023-02-09 NOTE — Progress Notes (Signed)
Occupational Therapy Treatment Patient Details Name: Amanda [name] MRN: [id number] DOB: March 23, 1946 Today's Date: 02/09/2023   History of present illness Pt is a 77 year old woman admitted on 10/1 with AMS, poor PO intake, SIRS, hypernatremia, AKI secondary to dehydration. Pt + for DVT. Required cortrak due to dysphagia, now discontinued. PMH: dementia, sacral wound, PNA and UTI in June 2024.   OT comments  Pt with minimal interest in eating/drinking. Resistant to hand over hand assist for drinking with straw or eating with spoon. Total assist to wash face and change gown. Total assist for bed mobility and positioning on R side for pressure relief. Patient will benefit from continued inpatient follow up therapy, <3 hours/day vs LTC.      If plan is discharge home, recommend the following:  Two people to help with walking and/or transfers;Two people to help with bathing/dressing/bathroom;Assistance with cooking/housework;Assistance with feeding;Direct supervision/assist for medications management;Direct supervision/assist for financial management;Assist for transportation;Help with stairs or ramp for entrance   Equipment Recommendations  Hoyer lift;Hospital bed    Recommendations for Other Services      Precautions / Restrictions Precautions Precautions: Fall       Mobility Bed Mobility Overal bed mobility: Needs Assistance Bed Mobility: Rolling Rolling: Total assist         General bed mobility comments: rolled to supine and positioned with head up for oral care with sponge and eating, positioned on R side at end of session with pillow between knees and heels floating    Transfers                         Balance                                           ADL either performed or assessed with clinical judgement   ADL                                         General ADL Comments: total assist for eating and drinking     Extremity/Trunk Assessment              Vision       Perception     Praxis      Cognition Arousal: Alert Behavior During Therapy: Flat affect Overall Cognitive Status: History of cognitive impairments - at baseline                                 General Comments: non verbal, does not follow commands        Exercises      Shoulder Instructions       General Comments      Pertinent Vitals/ Pain       Pain Assessment Pain Assessment: Faces Faces Pain Scale: Hurts a little bit Pain Location: generalized with repositioning in bed Pain Descriptors / Indicators: Moaning Pain Intervention(s): Repositioned, Monitored during session  Home Living                                          Prior Functioning/Environment  Frequency  Min 1X/week        Progress Toward Goals  OT Goals(current goals can now be found in the care plan section)  Progress towards OT goals: Not progressing toward goals - comment  Acute Rehab OT Goals OT Goal Formulation: With family Time For Goal Achievement: 02/16/23 Potential to Achieve Goals: Fair  Plan      Co-evaluation                 AM-PAC OT "6 Clicks" Daily Activity     Outcome Measure   Help from another person eating meals?: Total Help from another person taking care of personal grooming?: Total Help from another person toileting, which includes using toliet, bedpan, or urinal?: Total Help from another person bathing (including washing, rinsing, drying)?: Total Help from another person to put on and taking off regular upper body clothing?: Total Help from another person to put on and taking off regular lower body clothing?: Total 6 Click Score: 6    End of Session    OT Visit Diagnosis: Muscle weakness (generalized) (M62.81);Cognitive communication deficit (R41.841)   Activity Tolerance     Patient Left in bed;with call bell/phone within reach;with bed  alarm set   Nurse Communication          Time: 1610-9604 OT Time Calculation (min): 21 min  Charges: OT General Charges $OT Visit: 1 Visit OT Treatments $Self Care/Home Management : 8-22 mins  [name], OTR/L Acute Rehabilitation Services Office: [phone]   [name] 02/09/2023, 10:25 AM

## 2023-02-09 NOTE — Consult Note (Signed)
WOC Nurse Consult Note: patient known to [facility] team from previous consults for sacrum 10/2 and 10/15 Reason for Consult: blister on toe  Wound type: 1.  Deep Tissue Pressure Injury R medial great toe  2.  Deep Tissue Pressure Injury R heel 3.  Unstageable PI sacrum  Pressure Injury POA: No  Measurement: 1.  DTPI R medial great toe 6 cm x 4 cm intact blood filled blister  2.  DTPI R heel 4 cm x 3 cm purple maroon discoloration that is evolving to eschar  3.  Sacrum Unstageable PI 5 cm x 5  cm 60% red 40% yellow/brown slough  Wound bed:as above  Drainage (amount, consistency, odor) minimal tan from sacrum  Periwound: intact  Dressing procedure/placement/frequency: Cleanse DTPI R medial great toe and R heel with soap and water, apply Xeroform to wound bed daily, cover with dry gauze and wrap in Kerlix roll gauze.   Continue with Medihoney to sacrum as necrotic tissue appears to be diminishing.    Place bilateral feet in Prevalon boots for pressure reduction (patient had Prevalon boots in room). Patient remains on low air loss mattress for pressure redistribution and moisture management.   POC discussed with bedside nurse.  WOC team will follow DTPI to R foot weekly.    Thank you,    Priscella Mann MSN, RN-BC, [employer] [phone]

## 2023-02-10 DIAGNOSIS — G9341 Metabolic encephalopathy: Secondary | ICD-10-CM | POA: Diagnosis not present

## 2023-02-10 LAB — GLUCOSE, CAPILLARY
Glucose-Capillary: 109 mg/dL — ABNORMAL HIGH (ref 70–99)
Glucose-Capillary: 89 mg/dL (ref 70–99)
Glucose-Capillary: 109 mg/dL — ABNORMAL HIGH (ref 70–99)
Glucose-Capillary: 118 mg/dL — ABNORMAL HIGH (ref 70–99)

## 2023-02-10 NOTE — TOC Progression Note (Addendum)
Transition of Care [facility]) - Progression Note    Patient Details  Name: Amanda [name] MRN: [id number] Date of Birth: January 16, 1946  Transition of [facility] CM/SW Contact  Mylen Mangan A [place], [place] Phone Number: 02/10/2023, 11:10 AM  Clinical Narrative:     Update 11/6 1247 CSW contacted pt's spouse, [name], He stated that he had toured some facilities but they had been booked so they are meeting with a family home in service provider at 2pm for possible 24/7 home care. CSW to follow up at another time and assist with decision for disposition.   Update 11/5  1639 CSW went to bedside to receive update from family regarding choice of facilities after various tours. No family at bedside. CSW was contacted by Duwayne Heck at [facility], she stated that the family had not told her of a final choice and would reach out to family in the AM to follow up with decision.   Update 11/1 1125  CSW was provided update from [place] at [facility] about pt's family tours. She stated that family liked facilities, but they are waiting for brother, who is visiting this weekend, to do one more tour on Monday at 11am, before finalizing choice. Family is open to outpatient palliative/hospice following to facility. TOC will follow up with family Monday to find out about decision for facility placement.   Update 10/30 1303 CSW was updated by Duwayne Heck at [facility]. She said pt's family are going to be touring potential facilities on Thursday in attempt to find placement for pt. She stated she would update CSW on preferences after visit and CSW will send out referral information and finalize discharge details once placement is solidified.   Update 10/29 1125 CSW was contacted by Duwayne Heck at [facility]. She stated that she has provided offers to family and will possibly be touring this afternoon versus tomorrow at recommended facilities. She stated she would be at bedside this afternoon if possible and have meeting with TOC  and pt's family regarding next steps for LTC placement.   Update 10/28 1645 CSW went to pt's room, family had not yet arrived  at pt's bedside. CSW was then followed up with by nursing, who informed CSW that she had spoken with pt's family at bedside and updated CSW. She said they had been in contact with Danielle at [facility] regarding pt's options for LTC and also provided some additional information that they had regarding hospice care. No facility choice decision made. CSW will follow up with pt and family and contact regarding facility preferences and discharge planning.   10/25 1527 CSW reached out to [place] at [facility] and sent referral with permission from family to provide contact information and she stated that she would reach out and consult. CSW contacted [facility] and left VM with facility regarding bed availability. Placement status pending, TOC will continue to follow.   Update 1644 CSW spoke with pt's daughter, [name] at bedside. CSW received permission from daughter for initial documents to be sent over via email from [facility]. Also will give permission to [employer], community liaison that assists with placement support to share contact information and provide referral.   Update 1539 CSW spoke with [place] at [facility]. She stated that their may not be a possibility for placement with current openings and that it can be a lengthy process if they can offer a bed. She requested CSW reach out to pt and family, obtain email information and can document information and  will follow up with family for possible next steps.   CSW also sent information to in HUB to [facility], preference facility, and will follow up with facility regarding LTC bed options.    Update 1355  CSW reached back out to [facility]- left voicemail with [place], admissions, [phone].   CSW reached out to the following facilities in attempt to find bed offers for pt.   [facility]- not  currently offering LTC for memory care patients [facility]- no beds available [facility]- Wait list for LTC beds is 5 years out [facility] [address]- left voicemail with [place], admissions, [phone]  TOC will continue to follow.   Expected Discharge Plan: Skilled Nursing Facility Barriers to Discharge: Continued Medical Work up  Expected Discharge Plan and Services                                               Social Determinants of Health (SDOH) Interventions SDOH Screenings   Food Insecurity: No Food Insecurity (10/09/2022)  Housing: Patient Unable To Answer (10/18/2022)  Transportation Needs: No Transportation Needs (10/09/2022)  Utilities: Not At Risk (10/09/2022)  Depression (PHQ2-9): Low Risk  (09/14/2022)  Financial Resource Strain: Low Risk  (09/14/2022)  Physical Activity: Inactive (09/14/2022)  Stress: No Stress Concern Present (09/14/2022)  Tobacco Use: Low Risk  (01/18/2023)    Readmission Risk Interventions    02/07/2023    4:43 PM  Readmission Risk Prevention Plan  Transportation Screening Complete  PCP or Specialist Appt within 3-5 Days Complete  HRI or Home Care Consult Complete  Social Work Consult for Recovery Care Planning/Counseling Complete  Palliative Care Screening Complete  Medication Review [employer]) Complete

## 2023-02-10 NOTE — Progress Notes (Signed)
Progress Note   Patient: Amanda [name] DOB: September 18, 1945 DOA: 01/18/2023     23 DOS: the patient was seen and examined on 02/10/2023   Brief hospital course: 77 y.o. with h/o moderate dementia and pressure ulcer who presented on 10/1 with progressively worsening AMS.  Found to have hypernatremia and AKI.  Started on tube feeds via Cortrak and developed diarrhea from refeeding syndrome.  Enteral feeding did not improve mentation and Cortrak was removed.  Patient is DNR/DNI.   Assessment and Plan:  Note: Patient is medically stable but with terminal dementia and is awaiting placement in long-term care facility.  Acute metabolic encephalopathy superimposed on dementia Dementia for years - at baseline, wheelchair bound (walked 80 ft with walker with assistance in early September at rehab - wheelchair bound since being home), she's disoriented typically, but will communicate.   Likely multifactorial (hypernatremia, uremia/AKI, dementia) but no obvious inciting cause No clear infectious cause (CXR without active disease, CT maxillofacial without notable findings, CT abd/pelvis without acute findings, decub doesn't appear infected) Unremarkable evaluation including EEG (moderate to severe diffuse encephalopathy, no seizures or epileptiform discharges) and head CT/MRI Treating hypernatremia/AKI with significant improvement but no improvement in mentation Appears to have advanced dementia at this time, not even oriented to person   Goals of Care Patient remains with persistent encephalopathy Her overall prognosis is poor She's now s/p tube feeding from 10/9-10/15 On 10/16 discussed with daughter, Mrs. Amanda [name] - PO intake is likely inadequate to meet her needs Hospice is most reasonable option but family isn't ready to pursue this at this time Family does not want a PEG tube Likely to go to long-term care but she is not anticipated to be able to meet nutritional needs there and  is likely to benefit from hospice    SIRS (systemic inflammatory response syndrome) (HCC) Initial tachycardia, WBC 17k.  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 patient seen in ED per WOC nurs (Active)  01/19/23 1029  Location: Sacrum  Location Orientation: Medial  Staging: Unstageable - Full thickness tissue loss in which the base of the injury is covered by slough (yellow, tan, gray, green or brown) and/or eschar (tan, brown or black) in the wound bed.  Wound Description (Comments): patient seen in ED per WOC nursing  Present on Admission: Yes     Pressure Injury 01/23/23 Thigh Posterior;Proximal;Right Stage 2 -  Partial thickness loss of dermis presenting as a shallow open injury with a red, pink wound bed without slough. (Active)  01/23/23 0845  Location: Thigh  Location Orientation: Posterior;Proximal;Right  Staging: Stage 2 -  Partial thickness loss of dermis presenting as a shallow open injury with a  red, pink wound bed without slough.  Wound Description (Comments):   Present on Admission:     Hypertension BP appropriate without meds         Consultants: Palliative care TOC team   Procedures: 01-21-2023 EEG with moderate to severe diffuse encephalopathy, no seizures or epileptiform discharges Cortrak placed 10//9 Cortrak removed 10/15   Antibiotics: Cefepime x 1 Ceftriaxone 10/1-8 Vancomycin 10/1-3     30 Day Unplanned Readmission Risk Score    Flowsheet Row ED to Hosp-Admission (Current) from 01/18/2023 in [facility]  30 Day Unplanned Readmission Risk Score (%) 23.57 Filed at 02/10/2023 0400       This score is the patient's risk of an unplanned readmission within 30 days of being discharged (0 -100%). The score is based on dignosis, age, lab data, medications, orders, and past utilization.   Low:  0-14.9   Medium: 15-21.9   High: 22-29.9   Extreme: 30 and above           Subjective: Awake, alert, mostly nonsensical speech (she did say "yes" to one question)  I spoke with her husband,  He is there and feeding her now.  They are waiting to hear back from SW about bed placement.   Objective: Vitals:   02/10/23 0518 02/10/23 0732  BP: (!) 141/74 129/66  Pulse: (!) 108 (!) 110  Resp: 18 18  Temp: 98.9 F (37.2 C) 98.9 F (37.2 C)  SpO2: 100% 100%    Intake/Output Summary (Last 24 hours) at 02/10/2023 0733 Last data filed at 02/09/2023 1100 Gross per 24 hour  Intake 120 ml  Output --  Net 120 ml   Filed Weights   02/03/23 0500 02/08/23 0500 02/10/23 0518  Weight: 50 kg 50 kg 44 kg    Exam:  General:  Appears calm and comfortable and is in NAD, generally nonverbal but awake and alert Eyes:   normal lids, iris ENT:  grossly normal hearing, lips & tongue, mmm Neck:  no LAD, masses or thyromegaly Cardiovascular:  RRR, no m/r/g. No LE edema.  Respiratory:   CTA bilaterally with no wheezes/rales/rhonchi.  Normal respiratory  effort. Abdomen:  soft, NT, ND Skin:  no rash or induration seen on limited exam Musculoskeletal:  hypertonic/contracted Psychiatric:  alert, mostly nonverbal with scant nonsensical speech Neurologic:  unable to effectively perform  Data Reviewed: I have reviewed the patient's lab results since admission.  Pertinent labs for today include:   None     Family Communication: None present; I was unable to reach her daughter by telephone   Disposition: Status is: Inpatient Remains inpatient appropriate because: unsafe disposition     Time spent: 35 minutes  Unresulted Labs (From admission, onward)    None        Author: Jonah Blue, MD 02/10/2023 7:33 AM  For on call review [email or web address].

## 2023-02-11 DIAGNOSIS — G9341 Metabolic encephalopathy: Secondary | ICD-10-CM | POA: Diagnosis not present

## 2023-02-11 LAB — GLUCOSE, CAPILLARY
Glucose-Capillary: 109 mg/dL — ABNORMAL HIGH (ref 70–99)
Glucose-Capillary: 99 mg/dL (ref 70–99)
Glucose-Capillary: 70 mg/dL (ref 70–99)
Glucose-Capillary: 104 mg/dL — ABNORMAL HIGH (ref 70–99)
Glucose-Capillary: 88 mg/dL (ref 70–99)

## 2023-02-11 NOTE — Plan of Care (Signed)
A/Ox1 to self only. On room air. No complaints of pain. Total care provided. Sacral dressing changed. Incontinent. Appetite still poor but ate about 40% of dinner. Pills crushed in strawberry ice cream.    Problem: Education: Goal: Ability to describe self-care measures that may prevent or decrease complications (Diabetes Survival Skills Education) will improve Outcome: Progressing Goal: Individualized Educational Video(s) Outcome: Progressing   Problem: Coping: Goal: Ability to adjust to condition or change in health will improve Outcome: Progressing   Problem: Fluid Volume: Goal: Ability to maintain a balanced intake and output will improve Outcome: Progressing   Problem: Health Behavior/Discharge Planning: Goal: Ability to identify and utilize available resources and services will improve Outcome: Progressing Goal: Ability to manage health-related needs will improve Outcome: Progressing   Problem: Metabolic: Goal: Ability to maintain appropriate glucose levels will improve Outcome: Progressing   Problem: Nutritional: Goal: Maintenance of adequate nutrition will improve Outcome: Progressing Goal: Progress toward achieving an optimal weight will improve Outcome: Progressing   Problem: Skin Integrity: Goal: Risk for impaired skin integrity will decrease Outcome: Progressing   Problem: Tissue Perfusion: Goal: Adequacy of tissue perfusion will improve Outcome: Progressing   Problem: Education: Goal: Knowledge of General Education information will improve Description: Including pain rating scale, medication(s)/side effects and non-pharmacologic comfort measures Outcome: Progressing   Problem: Health Behavior/Discharge Planning: Goal: Ability to manage health-related needs will improve Outcome: Progressing   Problem: Clinical Measurements: Goal: Ability to maintain clinical measurements within normal limits will improve Outcome: Progressing Goal: Will remain free from  infection Outcome: Progressing Goal: Diagnostic test results will improve Outcome: Progressing Goal: Respiratory complications will improve Outcome: Progressing Goal: Cardiovascular complication will be avoided Outcome: Progressing   Problem: Activity: Goal: Risk for activity intolerance will decrease Outcome: Progressing   Problem: Nutrition: Goal: Adequate nutrition will be maintained Outcome: Progressing   Problem: Coping: Goal: Level of anxiety will decrease Outcome: Progressing   Problem: Elimination: Goal: Will not experience complications related to bowel motility Outcome: Progressing Goal: Will not experience complications related to urinary retention Outcome: Progressing   Problem: Pain Managment: Goal: General experience of comfort will improve Outcome: Progressing   Problem: Safety: Goal: Ability to remain free from injury will improve Outcome: Progressing   Problem: Skin Integrity: Goal: Risk for impaired skin integrity will decrease Outcome: Progressing

## 2023-02-11 NOTE — Progress Notes (Signed)
PT Cancellation Note  Patient Details Name: Amanda [name] MRN: [id number] DOB: 02/13/1946   Cancelled Treatment:    Reason Eval/Treat Not Completed: (P) Other (comment), pt greeted resting in bed, awake and alert however nonverbal, attempting to initiate mobility with pt resisting and shaking head no. Attempted to engage pt in session with music with no success in progressing mobility. Will check back as schedule allows to continue with PT POC.  Lenora Boys. PTA Acute Rehabilitation Services Office: [phone]    Catalina Antigua 02/11/2023, 2:29 PM

## 2023-02-11 NOTE — Progress Notes (Signed)
Progress Note   Patient: Amanda [name]:[id number] DOB: [birth year] DOA: 01/18/2023     24 DOS: the patient was seen and examined on 02/11/2023   Brief hospital course: 77 y.o. with h/o moderate dementia and pressure ulcer who presented on 10/1 with progressively worsening AMS.  Found to have hypernatremia and AKI.  Started on tube feeds via Cortrak and developed diarrhea from refeeding syndrome.  Enteral feeding did not improve mentation and Cortrak was removed.  Patient is DNR/DNI. Patient is in terminal but currently medically stable condition and is awaiting placement for LTC services.   Assessment and Plan:  Note: Patient is medically stable but with terminal dementia and is awaiting placement in long-term care facility.   Acute metabolic encephalopathy superimposed on dementia Dementia for years - at baseline, wheelchair bound (walked 80 ft with walker with assistance in early September at rehab - wheelchair bound since being home), she's disoriented typically, but will communicate.   Likely multifactorial (hypernatremia, uremia/AKI, dementia) but no obvious inciting cause No clear infectious cause (CXR without active disease, CT maxillofacial without notable findings, CT abd/pelvis without acute findings, decub doesn't appear infected) Unremarkable evaluation including EEG (moderate to severe diffuse encephalopathy, no seizures or epileptiform discharges) and head CT/MRI Treating hypernatremia/AKI with significant improvement but no improvement in mentation Appears to have advanced dementia at this time, not even oriented to person   Goals of Care Patient remains with persistent encephalopathy Her overall prognosis is poor She's now s/p tube feeding from 10/9-10/15 On 10/16 discussed with daughter, [name] - PO intake is likely inadequate to meet her needs Hospice is most reasonable option but family isn't ready to pursue this at this time Family does not want a PEG  tube Likely to go to long-term care but she is not anticipated to be able to meet nutritional needs there and is likely to benefit from hospice    SIRS (systemic inflammatory response syndrome) (HCC) Initial tachycardia, WBC 17k.  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 patient seen in ED per WOC nurs (Active)  01/19/23 1029  Location: Sacrum  Location Orientation: Medial  Staging: Unstageable - Full thickness tissue loss in which the base of the injury is covered by slough (yellow, tan, gray, green or brown) and/or eschar (tan, brown or black) in the wound bed.  Wound Description (Comments): patient seen in ED per WOC nursing  Present on Admission: Yes     Pressure Injury 01/23/23 Thigh Posterior;Proximal;Right Stage 2 -  Partial thickness loss  of dermis presenting as a shallow open injury with a red, pink wound bed without slough. (Active)  01/23/23 0845  Location: Thigh  Location Orientation:  Posterior;Proximal;Right  Staging: Stage 2 -  Partial thickness loss of dermis presenting as a shallow open injury with a red, pink wound bed without slough.  Wound Description (Comments):   Present on Admission:     Hypertension BP appropriate without meds         Consultants: Palliative care TOC team   Procedures: 01-21-2023 EEG with moderate to severe diffuse encephalopathy, no seizures or epileptiform discharges Cortrak placed 10//9 Cortrak removed 10/15   Antibiotics: Cefepime x 1 Ceftriaxone 10/1-8 Vancomycin 10/1-3   30 Day Unplanned Readmission Risk Score    Flowsheet Row ED to Hosp-Admission (Current) from 01/18/2023 in [facility] [place] [facility]  30 Day Unplanned Readmission Risk Score (%) 20.17 Filed at 02/11/2023 0401       This score is the patient's risk of an unplanned readmission within 30 days of being discharged (0 -100%). The score is based on dignosis, age, lab data, medications, orders, and past utilization.   Low:  0-14.9   Medium: 15-21.9   High: 22-29.9   Extreme: 30 and above           Subjective: Patient being fed by nurse tech, seemingly content.   Objective: Vitals:   02/10/23 2010 02/11/23 0347  BP: 117/86 122/65  Pulse:  (!) 106  Resp: 17 18  Temp: 98.9 F (37.2 C) 98.7 F (37.1 C)  SpO2: 100% 100%    Intake/Output Summary (Last 24 hours) at 02/11/2023 0746 Last data filed at 02/10/2023 2100 Gross per 24 hour  Intake 120 ml  Output --  Net 120 ml   Filed Weights   02/08/23 0500 02/10/23 0518 02/11/23 0500  Weight: 50 kg 44 kg 44 kg    Exam:  General:  Appears calm and comfortable and is in NAD, generally nonverbal but awake and alert, being fed at the time of my evaluation Eyes:   normal lids, iris ENT:  grossly normal hearing, lips & tongue, mmm Neck:  no LAD, masses or thyromegaly Cardiovascular:  RRR, no m/r/g. No LE edema.  Respiratory:   CTA bilaterally with no wheezes/rales/rhonchi.  Normal respiratory  effort. Abdomen:  soft, NT, ND Skin:  no rash or induration seen on limited exam Musculoskeletal:  hypertonic/contracted, mittens in place Psychiatric:  alert, mostly nonverbal with scant nonsensical speech, eating food and alternately putting mittens in her mouth Neurologic:  unable to effectively perform  Data Reviewed: I have reviewed the patient's lab results since admission.  Pertinent labs for today include:   None   Family Communication: None present; I didn't attempt to call today  Disposition: Status is: Inpatient Remains inpatient appropriate because: unsafe disposition     Time spent: 25 minutes  Unresulted Labs (From admission, onward)    None        Author: Jonah Blue, MD 02/11/2023 7:46 AM  For on call review [email or web address].

## 2023-02-11 NOTE — Plan of Care (Signed)
  Patient oriented to self and dependent. All needs anticipated. Right foot dressing cleansed and changed per order instructions. Pills crushed and mixed w/ small amounts of berry ensure. Last output noted 10/24 @ 2100, bladder scan @ 8:56am on 10/25 showed 56mL of urine.       Problem: Clinical Measurements: Goal: Will remain free from infection Outcome: Progressing   Problem: Coping: Goal: Level of anxiety will decrease Outcome: Progressing   Problem: Elimination: Goal: Will not experience complications related to urinary retention Outcome: Progressing   Problem: Pain Managment: Goal: General experience of comfort will improve Outcome: Progressing

## 2023-02-12 DIAGNOSIS — G9341 Metabolic encephalopathy: Secondary | ICD-10-CM | POA: Diagnosis not present

## 2023-02-12 DIAGNOSIS — Z7189 Other specified counseling: Secondary | ICD-10-CM | POA: Diagnosis not present

## 2023-02-12 DIAGNOSIS — Z515 Encounter for palliative care: Secondary | ICD-10-CM | POA: Diagnosis not present

## 2023-02-12 LAB — BASIC METABOLIC PANEL
Anion gap: 10 (ref 5–15)
BUN: 6 mg/dL — ABNORMAL LOW (ref 8–23)
CO2: 22 mmol/L (ref 22–32)
Calcium: 8.6 mg/dL — ABNORMAL LOW (ref 8.9–10.3)
Chloride: 108 mmol/L (ref 98–111)
Creatinine, Ser: 0.45 mg/dL (ref 0.44–1.00)
GFR, Estimated: >60 mL/min (ref >60)
Glucose, Bld: 92 mg/dL (ref 70–99)
Potassium: 3.6 mmol/L (ref 3.5–5.1)
Sodium: 140 mmol/L (ref 135–145)

## 2023-02-12 LAB — CBC
HCT: 32.3 % — ABNORMAL LOW (ref 36.0–46.0)
Hemoglobin: 10.1 g/dL — ABNORMAL LOW (ref 12.0–15.0)
MCH: 25.4 pg — ABNORMAL LOW (ref 26.0–34.0)
MCHC: 31.3 g/dL (ref 30.0–36.0)
MCV: 81.2 fL (ref 80.0–100.0)
Platelets: 497 10*3/uL — ABNORMAL HIGH (ref 150–400)
RBC: 3.98 MIL/uL (ref 3.87–5.11)
RDW: 15.8 % — ABNORMAL HIGH (ref 11.5–15.5)
WBC: 5 10*3/uL (ref 4.0–10.5)
nRBC: 0 % (ref 0.0–0.2)

## 2023-02-12 LAB — GLUCOSE, CAPILLARY
Glucose-Capillary: 93 mg/dL (ref 70–99)
Glucose-Capillary: 79 mg/dL (ref 70–99)
Glucose-Capillary: 143 mg/dL — ABNORMAL HIGH (ref 70–99)

## 2023-02-12 NOTE — Progress Notes (Signed)
Progress Note   Patient: Amanda [name] WUX:[id number] DOB: 08-28-1945 DOA: 01/18/2023     25 DOS: the patient was seen and examined on 02/12/2023   Brief hospital course: 77 y.o. with h/o moderate dementia and pressure ulcer who presented on 10/1 with progressively worsening AMS.  Found to have hypernatremia and AKI.  Started on tube feeds via Cortrak and developed diarrhea from refeeding syndrome.  Enteral feeding did not improve mentation and Cortrak was removed.  Patient is DNR/DNI. Patient is in terminal but currently medically stable condition and is awaiting placement for LTC services.  Assessment and Plan:  Note: Patient is medically stable but with terminal dementia and is awaiting placement in long-term care facility vs. Hospice facility.   Acute metabolic encephalopathy superimposed on dementia Dementia for years - at baseline, wheelchair bound (walked 80 ft with walker with assistance in early September at rehab - wheelchair bound since being home), she's disoriented typically, but will communicate.   Likely multifactorial (hypernatremia, uremia/AKI, dementia) but no obvious inciting cause No clear infectious cause (CXR without active disease, CT maxillofacial without notable findings, CT abd/pelvis without acute findings, decub doesn't appear infected) Unremarkable evaluation including EEG (moderate to severe diffuse encephalopathy, no seizures or epileptiform discharges) and head CT/MRI Treated hypernatremia/AKI with significant improvement but no improvement in mentation Appears to have advanced dementia at this time, not even oriented to person   Goals of Care Patient remains with persistent encephalopathy Her overall prognosis is poor She's now s/p tube feeding from 10/9-10/15 Family has been aware since 10/16 that PO intake is likely inadequate to meet her needs Hospice is most reasonable option but family hasn't been ready to pursue this Family does not want a PEG  tube Likely to go to long-term care but she is not anticipated to be able to meet nutritional needs there and is likely to benefit from hospice She has been turned down from multiple facilities for LTC and family is unwilling to consider others; hospice may be her only available option soon - they are starting to warm to the idea and are now receptive to talking with hospice liaison    SIRS (systemic inflammatory response syndrome) (HCC) Initial tachycardia, WBC 17k.  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 patient seen in ED per WOC nurs (Active)  01/19/23 1029  Location: Sacrum  Location Orientation: Medial  Staging: Unstageable - Full thickness tissue loss in which the base of the injury is covered by slough (yellow, tan, gray, green or brown)  and/or eschar (tan, brown or black) in the wound bed.  Wound Description (Comments): patient seen in ED per WOC nursing  Present on Admission: Yes     Pressure Injury  01/23/23 Thigh Posterior;Proximal;Right Stage 2 -  Partial thickness loss of dermis presenting as a shallow open injury with a red, pink wound bed without slough. (Active)  01/23/23 0845  Location: Thigh  Location Orientation: Posterior;Proximal;Right  Staging: Stage 2 -  Partial thickness loss of dermis presenting as a shallow open injury with a red, pink wound bed without slough.  Wound Description (Comments):   Present on Admission:     Hypertension BP appropriate without meds         Consultants: Palliative care TOC team   Procedures: 01-21-2023 EEG with moderate to severe diffuse encephalopathy, no seizures or epileptiform discharges Cortrak placed 10//9 Cortrak removed 10/15   Antibiotics: Cefepime x 1 Ceftriaxone 10/1-8 Vancomycin 10/1-3    30 Day Unplanned Readmission Risk Score    Flowsheet Row ED to Hosp-Admission (Current) from 01/18/2023 in [place] [facility] [place] [facility]  30 Day Unplanned Readmission Risk Score (%) 20.45 Filed at 02/12/2023 0400       This score is the patient's risk of an unplanned readmission within 30 days of being discharged (0 -100%). The score is based on dignosis, age, lab data, medications, orders, and past utilization.   Low:  0-14.9   Medium: 15-21.9   High: 22-29.9   Extreme: 30 and above           Subjective: Long bedside discussion with husband and daughter.  Many possible options reviewed - she has been turned down from many facilities, has prior experiences (personal or family) with other facilities and is unwilling to consider those, and family will not consider others based on ratings and reputations.  Her options are narrowing down.  Additional facility names provided and family will reach out.  Also discussed hospice GIP and benefits of this option; family is willing to meet with liaison for more information.  She was last hospitalized in June and discharged to [facility].  She returned home on 9/4 and her PCP was notified  on 9/12 that she was not appropriate for home health services because her dementia was too advanced.  She was home less than a month before current hospitalization.  Husband reports that she was able to mobilize herself in the wheelchair at home during this period.   Objective: Vitals:   02/11/23 2021 02/12/23 0358  BP: 128/63 (!) 117/59  Pulse: (!) 103 (!) 101  Resp: 17 18  Temp: 98.5 F (36.9 C) 98 F (36.7 C)  SpO2: 100% 100%    Intake/Output Summary (Last 24 hours) at 02/12/2023 0759 Last data filed at 02/11/2023 0920 Gross per 24 hour  Intake 15 ml  Output --  Net 15 ml   Filed Weights   02/10/23 0518 02/11/23 0500 02/12/23 0500  Weight: 44 kg 44 kg 45 kg    Exam:  General:  Appears calm and comfortable and is in NAD, generally nonverbal but awake and alert, being fed by family at the time of my evaluation Eyes:   normal lids, iris ENT:  grossly normal hearing, lips & tongue, mmm Neck:  no LAD, masses or thyromegaly Cardiovascular:  RRR, no m/r/g. No LE edema.  Respiratory:   CTA bilaterally with no wheezes/rales/rhonchi.  Normal respiratory effort. Abdomen:  soft, NT, ND Skin:  no rash or induration seen on limited exam Musculoskeletal:  hypertonic/contracted, mittens in place Psychiatric:  alert, mostly nonverbal with scant nonsensical speech Neurologic:  unable to effectively perform  Data Reviewed: I have reviewed the patient's lab results since admission.  Pertinent labs for today include:   Stable BMP, CBC     Family Communication: Spoke at length with husband and daughter  Disposition: Status is: Inpatient Remains inpatient appropriate because: unsafe disposition     Time spent: 50 minutes  Unresulted Labs (From admission, onward)    None        Author: Jonah Blue, MD 02/12/2023 7:59 AM  For on call review [email or web address].

## 2023-02-12 NOTE — Progress Notes (Signed)
Daily Progress Note   Patient Name: Amanda [name]       Date: 02/12/2023 DOB: Jun 08, 1945  Age: 77 y.o. MRN#: [id number] Attending Physician: Jonah Blue, MD Primary Care Physician: [name], MD Admit Date: 01/18/2023  Reason for Consultation/Follow-up: Establishing goals of care  Subjective: Patient laying in bed with mitts on. Daughter [name] and husband [name] at bedside.  Length of Stay: 25  Current Medications: Scheduled Meds:   apixaban  5 mg Oral BID   feeding supplement  237 mL Oral BID BM   folic acid  1 mg Oral Daily   leptospermum manuka honey  1 Application Topical Daily   potassium chloride  40 mEq Oral BID    Continuous Infusions:    PRN Meds: acetaminophen (TYLENOL) oral liquid 160 mg/5 mL, ondansetron **OR** ondansetron (ZOFRAN) IV, mouth rinse  Physical Exam Vitals reviewed.  Constitutional:      General: She is awake.     Appearance: She is ill-appearing.     Comments: Appears a little agitated and in mitts  HENT:     Head: Normocephalic and atraumatic.  Cardiovascular:     Rate and Rhythm: Tachycardia present.  Pulmonary:     Effort: Pulmonary effort is normal.  Skin:    General: Skin is warm and dry.            Vital Signs: BP 124/74 (BP Location: Left Arm)   Pulse (!) 104   Temp 97.7 F (36.5 C) (Oral)   Resp 16   Wt 45 kg   SpO2 100%   BMI 16.51 kg/m  SpO2: SpO2: 100 % O2 Device: O2 Device: Room Air O2 Flow Rate: O2 Flow Rate (L/min): 0 L/min  Intake/output summary: No intake or output data in the 24 hours ending 02/12/23 1516   LBM: Last BM Date :  (uta) Baseline Weight: Weight: 58.1 kg Most recent weight: Weight: 45 kg    Patient Active Problem List   Diagnosis Date Noted   Refeeding syndrome 01/27/2023    Acute deep vein thrombosis (DVT) of left lower extremity (HCC) 01/26/2023   DNR (do not resuscitate)/DNI(Do Not Intubate) 01/26/2023   Dysphagia 01/26/2023   Hypokalemia 01/26/2023   Hypomagnesemia 01/26/2023   Protein-calorie malnutrition, severe 01/26/2023   Hypernatremia 01/18/2023   SIRS (systemic inflammatory response syndrome) (HCC) 01/18/2023  Decubitus ulcer 01/18/2023   Acute metabolic encephalopathy 10/09/2022   AKI (acute kidney injury) (HCC) 10/08/2022   Moderate dementia (HCC) 10/02/2014   Hyperlipidemia with target low density lipoprotein (LDL) cholesterol less than 130 mg/dL 32/44/0102   Hypertension 06/12/2012    Palliative Care Assessment & Plan   Patient Profile: 77 y.o. female  with past medical history of moderate dementia admitted on 01/18/2023 with AMS and poor PO intake. She was found to have AKI and hypernatremia.   Today's discussion:  Met with patient's daughter [name] and husband [name] at bedside. Sheree shared that she feels the patient had a good day yesterday-- that she was full of energy and was able to talk with family. She shared that she spent around an hour and a half feeding her mother.  I shared my worry that the patient is still not able to eat enough food to sustain her and that she has not had significant improvements. I shared I believe the patient would be eligible for residential hospice. Sheree shares Dr. Ophelia Charter has also told them this. I shared the hospice philosophy and what hospice might look like for this patient. We discussed that with a comfort/ hospice path the patient would no longer receive aggressive medical interventions such as continuous vital signs, lab work, radiology testing, or medications not focused on comfort. All care would focus on how the patient is looking and feeling. This would include management of any symptoms that may cause discomfort, pain, shortness of breath, cough, nausea, agitation, anxiety, and/or secretions etc.  We discussed that comfort foods would be offered but not forced.  The patient's husband asked if they could stop hospice if the patient started to improve and I shared that hospice can be revoked at any time, but I did not think the patient would have meaningful improvement. They would like to learn more about hospice so [facility] consult placed. They are still looking at LTC options as well.  Discussed the importance of continued conversation with family and the medical providers regarding overall plan of care and treatment options, ensuring decisions are within the context of the patient's values and GOCs.   Questions and concerns were addressed. The family was encouraged to call with questions or concerns. PMT will continue to support holistically.  Recommendations/Plan: DNR/DNI Limited scope of treatment Encouraged family to explore residential hospice-- [facility] consult placed   Code Status:    Code Status Orders  (From admission, onward)           Start     Ordered   01/28/23 1226  Do not attempt resuscitation (DNR)- Limited -Do Not Intubate (DNI)  (Code Status)  Continuous       Question Answer Comment  If pulseless and not breathing No CPR or chest compressions.   In Pre-Arrest Conditions (Patient Is Breathing and Has A Pulse) Do not intubate. Provide all appropriate non-invasive medical interventions. Avoid ICU transfer unless indicated or required.   Consent: Discussion documented in EHR or advanced directives reviewed      01/28/23 1226           Extensive chart review has been completed prior to seeing the patient and speaking with her daughter and husband at bedside including labs, vital signs, imagine, progress/consult notes, orders, medications, and available advance directive documents.  Care plan was discussed with bedside Dr. Ophelia Charter  Time spent: 80 minutes  Thank you for allowing the Palliative Medicine Team to assist in the care of this patient.  Sherryll Burger,  NP  Please contact Palliative Medicine Team phone at [phone] for questions and concerns.

## 2023-02-13 DIAGNOSIS — Z515 Encounter for palliative care: Secondary | ICD-10-CM | POA: Diagnosis not present

## 2023-02-13 DIAGNOSIS — G9341 Metabolic encephalopathy: Secondary | ICD-10-CM | POA: Diagnosis not present

## 2023-02-13 DIAGNOSIS — Z7189 Other specified counseling: Secondary | ICD-10-CM | POA: Diagnosis not present

## 2023-02-13 LAB — GLUCOSE, CAPILLARY
Glucose-Capillary: 75 mg/dL (ref 70–99)
Glucose-Capillary: 67 mg/dL — ABNORMAL LOW (ref 70–99)
Glucose-Capillary: 82 mg/dL (ref 70–99)
Glucose-Capillary: 109 mg/dL — ABNORMAL HIGH (ref 70–99)
Glucose-Capillary: 136 mg/dL — ABNORMAL HIGH (ref 70–99)

## 2023-02-13 NOTE — Progress Notes (Signed)
Progress Note   Patient: Amanda [name]:[id number] DOB: [birth year] DOA: 01/18/2023     26 DOS: the patient was seen and examined on 02/13/2023   Brief hospital course: 77 y.o. with h/o moderate dementia and pressure ulcer who presented on 10/1 with progressively worsening AMS.  Found to have hypernatremia and AKI.  Started on tube feeds via Cortrak and developed diarrhea from refeeding syndrome.  Enteral feeding did not improve mentation and Cortrak was removed.  Patient is DNR/DNI. Patient is in terminal but currently medically stable condition and is awaiting placement for LTC services.  Assessment and Plan:  Note: Patient is medically stable but with terminal dementia and is awaiting placement in long-term care facility vs. Hospice facility.   Acute metabolic encephalopathy superimposed on dementia Dementia for years - at baseline, wheelchair bound (walked 80 ft with walker with assistance in early September at rehab - wheelchair bound since being home), she's disoriented typically, but will communicate.   Likely multifactorial (hypernatremia, uremia/AKI, dementia) but no obvious inciting cause No clear infectious cause (CXR without active disease, CT maxillofacial without notable findings, CT abd/pelvis without acute findings, decub doesn't appear infected) Unremarkable evaluation including EEG (moderate to severe diffuse encephalopathy, no seizures or epileptiform discharges) and head CT/MRI Treated hypernatremia/AKI with significant improvement but no improvement in mentation Appears to have advanced dementia at this time, not even oriented to person   Goals of Care Patient remains with persistent encephalopathy Her overall prognosis is poor She's now s/p tube feeding from 10/9-10/15 Family has been aware since 10/16 that PO intake is likely inadequate to meet her needs Hospice is most reasonable option but family hasn't been ready to pursue this Family does not want a PEG  tube Likely to go to long-term care but she is not anticipated to be able to meet nutritional needs there and is likely to benefit from hospice She has been turned down from multiple facilities for LTC and family is unwilling to consider others; hospice may be her only available option soon - they are starting to warm to the idea and are now receptive to talking with hospice liaison, who is hoping to be able to see her today    SIRS (systemic inflammatory response syndrome) (HCC) Initial tachycardia, WBC 17k.  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 patient seen in ED per WOC nurs (Active)  01/19/23 1029  Location: Sacrum  Location Orientation: Medial  Staging: Unstageable - Full thickness tissue loss in which the base of the injury  is covered by slough (yellow, tan, gray, green or brown) and/or eschar (tan, brown or black) in the wound bed.  Wound Description (Comments): patient seen in ED per WOC nursing   Present on Admission: Yes     Pressure Injury 01/23/23 Thigh Posterior;Proximal;Right Stage 2 -  Partial thickness loss of dermis presenting as a shallow open injury with a red, pink wound bed without slough. (Active)  01/23/23 0845  Location: Thigh  Location Orientation: Posterior;Proximal;Right  Staging: Stage 2 -  Partial thickness loss of dermis presenting as a shallow open injury with a red, pink wound bed without slough.  Wound Description (Comments):   Present on Admission:     Hypertension BP appropriate without meds         Consultants: Palliative care TOC team   Procedures: 01-21-2023 EEG with moderate to severe diffuse encephalopathy, no seizures or epileptiform discharges Cortrak placed 10//9 Cortrak removed 10/15   Antibiotics: Cefepime x 1 Ceftriaxone 10/1-8 Vancomycin 10/1-3     30 Day Unplanned Readmission Risk Score    Flowsheet Row ED to Hosp-Admission (Current) from 01/18/2023 in [place] [facility] [place] [facility]  30 Day Unplanned Readmission Risk Score (%) 22.58 Filed at 02/13/2023 0401       This score is the patient's risk of an unplanned readmission within 30 days of being discharged (0 -100%). The score is based on dignosis, age, lab data, medications, orders, and past utilization.   Low:  0-14.9   Medium: 15-21.9   High: 22-29.9   Extreme: 30 and above           Subjective: Alert, pleasant, nonverbal   Objective: Vitals:   02/12/23 2044 02/13/23 0508  BP: 125/62 131/69  Pulse: (!) 110 (!) 104  Resp: 16 16  Temp: 98.5 F (36.9 C) 98.3 F (36.8 C)  SpO2: 100% 99%   No intake or output data in the 24 hours ending 02/13/23 0750 Filed Weights   02/10/23 0518 02/11/23 0500 02/12/23 0500  Weight: 44 kg 44 kg 45 kg    Exam:  General:  Appears calm and comfortable and is in NAD, generally nonverbal but awake and alert Eyes:   normal lids, iris ENT:  grossly normal hearing, lips & tongue, mmm Neck:  no LAD, masses or  thyromegaly Cardiovascular:  RRR, no m/r/g. No LE edema.  Respiratory:   CTA bilaterally with no wheezes/rales/rhonchi.  Normal respiratory effort. Abdomen:  soft, NT, ND Skin:  no rash or induration seen on limited exam Musculoskeletal:  hypertonic/contracted, mittens in place Psychiatric:  alert, mostly nonverbal with scant nonsensical speech Neurologic:  unable to effectively perform  Data Reviewed: I have reviewed the patient's lab results since admission.  Pertinent labs for today include:   None today     Family Communication: None present  Disposition: Status is: Inpatient Remains inpatient appropriate because: unsafe disposition     Time spent: 25 minutes  Unresulted Labs (From admission, onward)    None        Author: Jonah Blue, MD 02/13/2023 7:50 AM  For on call review [email or web address].

## 2023-02-13 NOTE — Plan of Care (Signed)

## 2023-02-13 NOTE — Progress Notes (Signed)
MEWS Progress Note  Patient Details Name: Amanda [name] MRN: [id number] DOB: 1945/09/07 Today's Date: 02/13/2023   MEWS Flowsheet Documentation:  Assess: MEWS Score Temp: (!) 97.5 F (36.4 C) BP: 126/72 MAP (mmHg): 87 Pulse Rate: 95 ECG Heart Rate: (!) 112 Resp: 18 Level of Consciousness: Alert SpO2: 100 % O2 Device: Room Air Patient Activity (if Appropriate): In bed O2 Flow Rate (L/min): 0 L/min Assess: MEWS Score MEWS Temp: 0 MEWS Systolic: 0 MEWS Pulse: 0 MEWS RR: 0 MEWS LOC: 0 MEWS Score: 0 MEWS Score Color: Green Assess: SIRS CRITERIA SIRS Temperature : 0 SIRS Respirations : 0 SIRS Pulse: 1 SIRS WBC: 0 SIRS Score Sum : 1          Santiago Bumpers 02/13/2023, 9:48 AM

## 2023-02-13 NOTE — Progress Notes (Addendum)
Daily Progress Note   Patient Name: Amanda [name]       Date: 02/13/2023 DOB: 02/20/1946  Age: 77 y.o. MRN#: [id number] Attending Physician: Jonah Blue, MD Primary Care Physician: [name], MD Admit Date: 01/18/2023  Reason for Consultation/Follow-up: Establishing goals of care  Subjective: Patient sitting up in bed alert with nursing at bedside encouraging patient to eat. She would not eat. RN shares giving her oral medications is also difficult. No family at bedside.  Length of Stay: 26  Current Medications: Scheduled Meds:   apixaban  5 mg Oral BID   feeding supplement  237 mL Oral BID BM   folic acid  1 mg Oral Daily   leptospermum manuka honey  1 Application Topical Daily   potassium chloride  40 mEq Oral BID    Continuous Infusions:    PRN Meds: acetaminophen (TYLENOL) oral liquid 160 mg/5 mL, ondansetron **OR** ondansetron (ZOFRAN) IV, mouth rinse  Physical Exam Vitals reviewed.  Constitutional:      General: She is awake.     Appearance: She is ill-appearing.  HENT:     Head: Normocephalic and atraumatic.  Cardiovascular:     Rate and Rhythm: Normal rate.  Pulmonary:     Effort: Pulmonary effort is normal.  Skin:    General: Skin is warm and dry.  Neurological:     Mental Status: She is alert.             Vital Signs: BP 126/72 (BP Location: Left Arm)   Pulse 95   Temp (!) 97.5 F (36.4 C)   Resp 18   Wt 45 kg   SpO2 100%   BMI 16.51 kg/m  SpO2: SpO2: 100 % O2 Device: O2 Device: Room Air O2 Flow Rate: O2 Flow Rate (L/min): 0 L/min  Intake/output summary: No intake or output data in the 24 hours ending 02/13/23 1302   LBM: Last BM Date :  (uta) Baseline Weight: Weight: 58.1 kg Most recent weight: Weight: 45 kg    Patient  Active Problem List   Diagnosis Date Noted   Refeeding syndrome 01/27/2023   Acute deep vein thrombosis (DVT) of left lower extremity (HCC) 01/26/2023   DNR (do not resuscitate)/DNI(Do Not Intubate) 01/26/2023   Dysphagia 01/26/2023   Hypokalemia 01/26/2023   Hypomagnesemia 01/26/2023   Protein-calorie  malnutrition, severe 01/26/2023   Hypernatremia 01/18/2023   SIRS (systemic inflammatory response syndrome) (HCC) 01/18/2023   Decubitus ulcer 01/18/2023   Acute metabolic encephalopathy 10/09/2022   AKI (acute kidney injury) (HCC) 10/08/2022   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 female  with past medical history of moderate dementia admitted on 01/18/2023 with AMS and poor PO intake. She was found to have AKI and hypernatremia.   Today's discussion:  1300: Called patient's daughter [name]. The family have no questions or concerns yet about the conversation yesterday--re: consideration of residential hospice. She would like PMT to check in tomorrow.  Recommendations/Plan: DNR/DNI Limited scope of treatment Encouraged family to explore residential hospice-- [facility] consult placed and [facility] liaison will try to make contact with family today if she can Continued PMT support    Code Status:    Code Status Orders  (From admission, onward)           Start     Ordered   01/28/23 1226  Do not attempt resuscitation (DNR)- Limited -Do Not Intubate (DNI)  (Code Status)  Continuous       Question Answer Comment  If pulseless and not breathing No CPR or chest compressions.   In Pre-Arrest Conditions (Patient Is Breathing and Has A Pulse) Do not intubate. Provide all appropriate non-invasive medical interventions. Avoid ICU transfer unless indicated or required.   Consent: Discussion documented in EHR  or advanced directives reviewed      01/28/23 1226           Extensive chart review has been completed prior to seeing the patient and calling her daughter including labs, vital signs, imagine, progress/consult notes, orders, medications, and available advance directive documents.  Care plan was discussed with bedside RN, Dr. Ophelia Charter, and Lawerance Sabal CM  Time spent: 35 minutes  Thank you for allowing the Palliative Medicine Team to assist in the care of this patient.   Sherryll Burger, NP  Please contact Palliative Medicine Team phone at [phone] for questions and concerns.

## 2023-02-13 NOTE — Progress Notes (Signed)
[facility] 2W06 [facility] Liaison Note  Received referral from Antony Madura, NP with Palliative Care, regarding hospice at LTC versus residential hospice for patient.  Spoke in great detail with patient's daughter [name] and husband via phone to discuss what hospice would look like at LTC facility and how residential hospice looks.  Per daughter and husband, patient's son will be in town 11/4 and they would like to take a tour of [facility] at that time.  I did let them know I would make the medical team aware of this request.  Liaison team will follow up with family later this week as they are not ready to make a decision today regarding discharge plans.  Please call with any hospice related questions or concerns.  Doreatha Martin, RN, BSN [employer] [phone]

## 2023-02-14 DIAGNOSIS — Z515 Encounter for palliative care: Secondary | ICD-10-CM | POA: Diagnosis not present

## 2023-02-14 DIAGNOSIS — Z7189 Other specified counseling: Secondary | ICD-10-CM | POA: Diagnosis not present

## 2023-02-14 DIAGNOSIS — G9341 Metabolic encephalopathy: Secondary | ICD-10-CM | POA: Diagnosis not present

## 2023-02-14 LAB — GLUCOSE, CAPILLARY
Glucose-Capillary: 95 mg/dL (ref 70–99)
Glucose-Capillary: 94 mg/dL (ref 70–99)
Glucose-Capillary: 112 mg/dL — ABNORMAL HIGH (ref 70–99)
Glucose-Capillary: 150 mg/dL — ABNORMAL HIGH (ref 70–99)

## 2023-02-14 NOTE — Progress Notes (Signed)
Progress Note   Patient: Amanda [name] NWG:[id number] DOB: 14-Jul-1945 DOA: 01/18/2023     27 DOS: the patient was seen and examined on 02/14/2023   Brief hospital course: 77 y.o. with h/o moderate dementia and pressure ulcer who presented on 10/1 with progressively worsening AMS.  Found to have hypernatremia and AKI.  Started on tube feeds via Cortrak and developed diarrhea from refeeding syndrome.  Enteral feeding did not improve mentation and Cortrak was removed.  Patient is DNR/DNI. Patient is in terminal but currently medically stable condition and is awaiting placement for LTC services.  Assessment and Plan:  Note: Patient is medically stable but with terminal dementia and is awaiting placement in long-term care facility vs. Hospice facility.   Acute metabolic encephalopathy superimposed on dementia Dementia for years - at baseline, wheelchair bound (walked 80 ft with walker with assistance in early September at rehab - wheelchair bound since being home), she's disoriented typically, but will communicate.   Likely multifactorial (hypernatremia, uremia/AKI, dementia) but no obvious inciting cause No clear infectious cause (CXR without active disease, CT maxillofacial without notable findings, CT abd/pelvis without acute findings, decub doesn't appear infected) Unremarkable evaluation including EEG (moderate to severe diffuse encephalopathy, no seizures or epileptiform discharges) and head CT/MRI Treated hypernatremia/AKI with significant improvement but no improvement in mentation Appears to have advanced dementia at this time, not even oriented to person   Goals of Care Patient remains with persistent encephalopathy Her overall prognosis is poor She's now s/p tube feeding from 10/9-10/15 Family has been aware since 10/16 that PO intake is likely inadequate to meet her needs Hospice is most reasonable option but family hasn't been ready to pursue this Family does not want a PEG  tube Likely to go to long-term care but she is not anticipated to be able to meet nutritional needs there and is likely to benefit from hospice She has been turned down from multiple facilities for LTC and family is unwilling to consider others; hospice may be her only available option soon - they are starting to warm to the idea and are now receptive to talking with hospice liaison, who is hoping to be able to see her today    SIRS (systemic inflammatory response syndrome) (HCC) Initial tachycardia, WBC 17k.  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 patient seen in ED per WOC nurs (Active)  01/19/23 1029  Location: Sacrum  Location Orientation: Medial  Staging: Unstageable - Full thickness tissue loss in which the base of the injury  is covered by slough (yellow, tan, gray, green or brown) and/or eschar (tan, brown or black) in the wound bed.  Wound Description (Comments): patient seen in ED per WOC nursing   Present on Admission: Yes     Pressure Injury 01/23/23 Thigh Posterior;Proximal;Right Stage 2 -  Partial thickness loss of dermis presenting as a shallow open injury with a red, pink wound bed without slough. (Active)  01/23/23 0845  Location: Thigh  Location Orientation: Posterior;Proximal;Right  Staging: Stage 2 -  Partial thickness loss of dermis presenting as a shallow open injury with a red, pink wound bed without slough.  Wound Description (Comments):   Present on Admission:     Hypertension BP appropriate without meds         Consultants: Palliative care TOC team   Procedures: 01-21-2023 EEG with moderate to severe diffuse encephalopathy, no seizures or epileptiform discharges Cortrak placed 10//9 Cortrak removed 10/15   Antibiotics: Cefepime x 1 Ceftriaxone 10/1-8 Vancomycin 10/1-3      30 Day Unplanned Readmission Risk Score    Flowsheet Row ED to Hosp-Admission (Current) from 01/18/2023 in [place] [facility]  30 Day Unplanned Readmission Risk Score (%) 22.89 Filed at 02/14/2023 0401       This score is the patient's risk of an unplanned readmission within 30 days of being discharged (0 -100%). The score is based on dignosis, age, lab data, medications, orders, and past utilization.   Low:  0-14.9   Medium: 15-21.9   High: 22-29.9   Extreme: 30 and above           Subjective: Patient nonverbal.  Nurse reports minimal PO intake.  She appears to be scratching her vaginal area repeatedly but exam is unremarkable by RN and myself this AM.   Objective: Vitals:   02/13/23 2017 02/14/23 0300  BP: (!) 122/57 119/60  Pulse: (!) 105 (!) 101  Resp: 19 19  Temp: 98.6 F (37 C) 98.4 F (36.9 C)  SpO2: 100% 100%    Intake/Output Summary (Last 24 hours) at 02/14/2023 0746 Last data filed at 02/13/2023 1307 Gross per 24 hour  Intake 50 ml  Output --  Net 50 ml   Filed Weights   02/10/23 0518 02/11/23 0500 02/12/23 0500  Weight: 44 kg 44 kg  45 kg    Exam:  General:  Appears calm and comfortable and is in NAD, generally nonverbal but awake and alert Eyes:   normal lids, iris ENT:  grossly normal hearing, lips & tongue, mmm Neck:  no LAD, masses or thyromegaly Cardiovascular:  RRR, no m/r/g. No LE edema.  Respiratory:   CTA bilaterally with no wheezes/rales/rhonchi.  Normal respiratory effort. Abdomen:  soft, NT, ND Skin:  no rash or induration seen on limited exam Musculoskeletal:  hypertonic/contracted, mittens in place Psychiatric:  alert, mostly nonverbal with scant nonsensical speech Neurologic:  unable to effectively perform  Data Reviewed: I have reviewed the patient's lab results since admission.  Pertinent labs for today include:   None     Family Communication: None present today  Disposition: Status is: Inpatient Remains inpatient appropriate because: unsafe disposition     Time spent: 25 minutes  Unresulted Labs (From admission, onward)    None        Author: Jonah Blue, MD 02/14/2023 7:46 AM  For on call review [email or web address].

## 2023-02-14 NOTE — Plan of Care (Signed)
  Problem: Fluid Volume: Goal: Ability to maintain a balanced intake and output will improve 02/14/2023 1714 by Barnet Pall, RN Outcome: Not Progressing  Problem: Nutritional: Goal: Maintenance of adequate nutrition will improve 02/14/2023 1714 by Barnet Pall, RN Outcome: Not Progressing Goal: Progress toward achieving an optimal weight will improve 02/14/2023 1714 by Barnet Pall, RN Outcome: Not Progressing  Problem: Skin Integrity: Goal: Risk for impaired skin integrity will decrease 02/14/2023 1714 by Barnet Pall, RN Outcome: Not Progressing  Problem: Nutrition: Goal: Adequate nutrition will be maintained 02/14/2023 1714 by Barnet Pall, RN Outcome: Not Progressing

## 2023-02-14 NOTE — Progress Notes (Signed)
Occupational Therapy Treatment and Discharge Patient Details Name: Amanda [name] MRN: [id number] DOB: 12-03-1945 Today's Date: 02/14/2023   History of present illness Pt is a 77 year old woman admitted on 10/1 with AMS, poor PO intake, SIRS, hypernatremia, AKI secondary to dehydration. Pt + for DVT. Required cortrak due to dysphagia, now discontinued. PMH: dementia, sacral wound, PNA and UTI in June 2024.   OT comments  Pt continues to frequently refuse food and drink with staff. Total assist for eating the few bites she accepted. Removed mitts and washed and dried hands. Pt with reflexive, non purposeful grasp when out of mitts. Replaced mitts at end of session. Updated recommendations.      If plan is discharge home, recommend the following:  Two people to help with walking and/or transfers;Two people to help with bathing/dressing/bathroom;Assistance with cooking/housework;Assistance with feeding;Direct supervision/assist for medications management;Direct supervision/assist for financial management;Assist for transportation;Help with stairs or ramp for entrance   Equipment Recommendations  Hoyer lift;Hospital bed    Recommendations for Other Services      Precautions / Restrictions Precautions Precautions: Fall Restrictions Weight Bearing Restrictions: No       Mobility Bed Mobility Overal bed mobility: Needs Assistance             General bed mobility comments: total assist to position for eating    Transfers                         Balance                                           ADL either performed or assessed with clinical judgement   ADL                                         General ADL Comments: washed/dried hands with total assist and replaced mitts, pt with no functional use of objects  ie: washcloth, spoon, cup    Extremity/Trunk Assessment              Vision   Additional Comments:  continues to have reflexive grasp   Perception     Praxis      Cognition Arousal: Alert Behavior During Therapy: Flat affect Overall Cognitive Status: History of cognitive impairments - at baseline                                 General Comments: non verbal, does not follow commands        Exercises      Shoulder Instructions       General Comments      Pertinent Vitals/ Pain       Pain Assessment Pain Assessment: Faces Faces Pain Scale: No hurt  Home Living                                          Prior Functioning/Environment              Frequency           Progress Toward Goals  OT Goals(current goals can now  be found in the care plan section)  Progress towards OT goals: Not progressing toward goals - comment     Plan      Co-evaluation                 AM-PAC OT "6 Clicks" Daily Activity     Outcome Measure   Help from another person eating meals?: Total Help from another person taking care of personal grooming?: Total Help from another person toileting, which includes using toliet, bedpan, or urinal?: Total Help from another person bathing (including washing, rinsing, drying)?: Total Help from another person to put on and taking off regular upper body clothing?: Total Help from another person to put on and taking off regular lower body clothing?: Total 6 Click Score: 6    End of Session    OT Visit Diagnosis: Muscle weakness (generalized) (M62.81);Other symptoms and signs involving cognitive function   Activity Tolerance Patient tolerated treatment well   Patient Left in bed;with call bell/phone within reach;with bed alarm set   Nurse Communication          Time: 1425-1440 OT Time Calculation (min): 15 min  Charges: OT General Charges $OT Visit: 1 Visit OT Treatments $Therapeutic Activity: 8-22 mins  [name], OTR/L Acute Rehabilitation Services Office: [phone]   [name] 02/14/2023, 4:08 PM

## 2023-02-14 NOTE — Plan of Care (Signed)
  Problem: Metabolic: Goal: Ability to maintain appropriate glucose levels will improve Outcome: Progressing   Problem: Clinical Measurements: Goal: Will remain free from infection Outcome: Progressing   Problem: Safety: Goal: Ability to remain free from injury will improve Outcome: Progressing   

## 2023-02-14 NOTE — Progress Notes (Signed)
   Palliative Medicine Inpatient Follow Up Note HPI: 77 y.o. female  with past medical history of moderate dementia admitted on 01/18/2023 with AMS and poor PO intake. She was found to have AKI and hypernatremia.   Palliative care has been asked to get involved to further assist with goals of care conversations.   Today's Discussion 02/14/2023  *Please note that this is a verbal dictation therefore any spelling or grammatical errors are due to the "[employer] One" system interpretation.  Chart reviewed inclusive of vital signs, progress notes, laboratory results, and diagnostic images.   I met at bedside with Johniya this morning she was resting comfortably and appearing in no distress.  I spoke with patients daughter, [name] this late morning. Offered support. She shared that she and her family are meeting with "[facility]" to better determine placement. She expressed that she did speak to Revonda Standard of [facility] yesterday and got answers to come of her questions. Sheree notes that her brother will be coming to town later this week which will help in terms of decision moving forward.   Questions answered and support provided.  Sheree plans to call the PMT if additional questions arise in the meanwhile we plan to remain peripherally involved.   Communicated with primary team and TOC via secure chat.  Objective Assessment: Vital Signs Vitals:   02/14/23 0300 02/14/23 0800  BP: 119/60 125/64  Pulse: (!) 101 (!) 102  Resp: 19 19  Temp: 98.4 F (36.9 C) 98.3 F (36.8 C)  SpO2: 100% 100%    Intake/Output Summary (Last 24 hours) at 02/14/2023 1435 Last data filed at 02/14/2023 1000 Gross per 24 hour  Intake 60 ml  Output --  Net 60 ml    Last Weight  Most recent update: 02/12/2023  5:33 AM    Weight  45 kg (99 lb 3.3 oz)            Gen:  Frail Elderly AA F chronically ill in appearance HEENT:  Dry mucous membranes CV: Regular rate and rhythm  PULM:  On RA,  breathing is even and nonlabored ABD: soft/nontender  EXT: (+) muscle wasting (-) edema  Neuro: Eyes closed, arouses   SUMMARY OF RECOMMENDATIONS   DNAR/DNI  Long Term care placement with outpatient palliative support  Plan for peripheral PMT support  Time - 59 ______________________________________________________________________________________ Lamarr Lulas [facility]er Palliative Medicine Team Team Cell Phone: [phone] Please utilize secure chat with additional questions, if there is no response within 30 minutes please call the above phone number  Palliative Medicine Team providers are available by phone from 7am to 7pm daily and can be reached through the team cell phone.  Should this patient require assistance outside of these hours, please call the patient's attending physician.

## 2023-02-15 DIAGNOSIS — G9341 Metabolic encephalopathy: Secondary | ICD-10-CM | POA: Diagnosis not present

## 2023-02-15 LAB — GLUCOSE, CAPILLARY
Glucose-Capillary: 85 mg/dL (ref 70–99)
Glucose-Capillary: 97 mg/dL (ref 70–99)
Glucose-Capillary: 105 mg/dL — ABNORMAL HIGH (ref 70–99)
Glucose-Capillary: 103 mg/dL — ABNORMAL HIGH (ref 70–99)

## 2023-02-15 MED ORDER — POTASSIUM CHLORIDE 20 MEQ PO PACK
40.0000 meq | PACK | Freq: Two times a day (BID) | ORAL | Status: DC
  Administered 2023-02-15 – 2023-02-26 (×17): 40 meq via ORAL
  Filled 2023-02-15 (×21): qty 2

## 2023-02-15 MED ORDER — POTASSIUM CHLORIDE CRYS ER 20 MEQ PO TBCR: 40.0000 meq | EXTENDED_RELEASE_TABLET | Freq: Two times a day (BID) | ORAL | Status: DC

## 2023-02-15 NOTE — Progress Notes (Signed)
Nutrition Follow-up  DOCUMENTATION CODES:   Severe malnutrition in context of chronic illness  INTERVENTION:  Continue with Current diet Ensure Enlive po BID, each supplement provides 350 kcal and 20 grams of protein. Mighty Shake TID with meals, each supplement provides 330 kcals and 9 grams of protein Continue to encourage oral intake.    NUTRITION DIAGNOSIS:   Severe Malnutrition related to chronic illness (moderate dementia) as evidenced by severe fat depletion, severe muscle depletion.  Ongoing addressed through interventions  GOAL:   Patient will meet greater than or equal to 90% of their needs    MONITOR:   PO intake, Labs, Weight trends, Diet advancement, TF tolerance  REASON FOR ASSESSMENT:   Consult Calorie Count  ASSESSMENT:   Pt admitted with AMS and HTN. PMH significant for moderate dementia.  Patient had cortrak placed in relation to declined oral intake although did not improve status. Palliative care consulted patient pending LTC discharge.  Requires assistance  and will only take a few bites along with only drink a little of the supplements.  Continue to encourage oral intake.  NUTRITION - FOCUSED PHYSICAL EXAM:  Flowsheet Row Most Recent Value  Orbital Region Moderate depletion  Upper Arm Region Severe depletion  Thoracic and Lumbar Region Severe depletion  Buccal Region Severe depletion  Temple Region Moderate depletion  Clavicle Bone Region Severe depletion  Clavicle and Acromion Bone Region Severe depletion  Scapular Bone Region Severe depletion  Dorsal Hand Unable to assess  [in mits]  Patellar Region Moderate depletion  Anterior Thigh Region Moderate depletion  Posterior Calf Region Moderate depletion  Edema (RD Assessment) None  Hair Reviewed  Eyes Unable to assess  Mouth Unable to assess  Skin Reviewed  Nails Unable to assess       Diet Order:   Diet Order             DIET - DYS 1 Room service appropriate? No; Fluid  consistency: Thin  Diet effective now                   EDUCATION NEEDS:   Education needs have been addressed  Skin:  Skin Assessment: Skin Integrity Issues: Skin Integrity Issues:: DTI, Stage II DTI: Sacrum Stage II: Right posterior thigh  Last BM:  10/15  Height:   Ht Readings from Last 1 Encounters:  10/08/22 5\' 5"  (1.651 m)    Weight:   Wt Readings from Last 1 Encounters:  02/12/23 45 kg    Ideal Body Weight:     BMI:  Body mass index is 16.51 kg/m.  Estimated Nutritional Needs:   Kcal:  1400-1600  Protein:  70-85g  Fluid:  >/=1.5L    Jamelle Haring RDN, LDN Clinical Dietitian  RDN pager # available on Amion

## 2023-02-15 NOTE — Plan of Care (Signed)
  Problem: Education: Goal: Ability to describe self-care measures that may prevent or decrease complications (Diabetes Survival Skills Education) will improve Outcome: Progressing   Problem: Education: Goal: Individualized Educational Video(s) Outcome: Progressing   Problem: Coping: Goal: Ability to adjust to condition or change in health will improve Outcome: Progressing

## 2023-02-15 NOTE — Progress Notes (Signed)
Progress Note   Patient: Amanda [name]:[id number] DOB: 1946-04-04 DOA: 01/18/2023     28 DOS: the patient was seen and examined on 02/15/2023   Brief hospital course: 77 y.o. with h/o moderate dementia and pressure ulcer who presented on 10/1 with progressively worsening AMS.  Found to have hypernatremia and AKI.  Started on tube feeds via Cortrak and developed diarrhea from refeeding syndrome.  Enteral feeding did not improve mentation and Cortrak was removed.  Patient is DNR/DNI. Patient is in terminal but currently medically stable condition and is awaiting placement for LTC services.  Assessment and Plan:   Note: Patient is medically stable but with terminal dementia and is awaiting placement in long-term care facility vs. Hospice facility.   Acute metabolic encephalopathy superimposed on dementia Dementia for years - at baseline, wheelchair bound (walked 80 ft with walker with assistance in early September at rehab - wheelchair bound since being home), she's disoriented typically, but will communicate.   Likely multifactorial (hypernatremia, uremia/AKI, dementia) but no obvious inciting cause No clear infectious cause (CXR without active disease, CT maxillofacial without notable findings, CT abd/pelvis without acute findings, decub doesn't appear infected) Unremarkable evaluation including EEG (moderate to severe diffuse encephalopathy, no seizures or epileptiform discharges) and head CT/MRI Treated hypernatremia/AKI with significant improvement but no improvement in mentation Appears to have advanced dementia at this time, not even oriented to person   Goals of Care Patient remains with persistent encephalopathy Her overall prognosis is poor She's now s/p tube feeding from 10/9-10/15 Family has been aware since 10/16 that PO intake is likely inadequate to meet her needs Hospice is most reasonable option but family hasn't been ready to pursue this Family does not want a PEG  tube Likely to go to long-term care but she is not anticipated to be able to meet nutritional needs there and is likely to benefit from hospice She has been turned down from multiple facilities for LTC and family is unwilling to consider others; hospice may be her only available option soon - they are starting to warm to the idea and are now receptive to talking with hospice liaison So far, family is deferring placement decisions and needs to be encouraged to choose a facility    SIRS (systemic inflammatory response syndrome) (HCC) Initial tachycardia, WBC 17k.  Question of SIRS / Sepsis with unclear source Sepsis ruled out Blood and urine cultures negative Treated empirically with ceftriaxone x 7 days and now off antibiotics   Dysphagia/Malnutrition Dysphagia 1, thin liquid per SLP 10/15 S/p NG tube 10/9-10/15  Did calorie count and unable to meet caloric needs per nutrition Developed refeeding syndrome with tube feeds but diarrhea is now improved and she no longer has a rectal tube Family has declined feeding tube She is not meeting her caloric or fluid needs, and this has been discussed with family Minimal UOP, bladder scanning with some retention but not enough to warrant a foley at this time Nutrition Problem: Severe Malnutrition Etiology: chronic illness (moderate dementia) Signs/Symptoms: severe fat depletion, severe muscle depletion Interventions: Tube feeding Hypernatremia/AKI/Hypoglycemia resolved during the hospitalization but are very likely to recur given her limited PO intake Will need to be offered water throughout the day while at SNF to avoid dehydration   DVT Extensive --> [name] with DVT involving L common femoral vein, SF junction, L femoral vein, L proximal profunda vein, L popliteal vein, L posterior tibial veins, and L peroneal veins On Eliquis   Decubitus ulcer Pressure Injury 01/19/23  Sacrum Medial Unstageable - Full thickness tissue loss in which the base of the  injury is covered by slough (yellow, tan, gray, green or brown) and/or eschar (tan, brown or black) in the wound bed. patient seen in ED per WOC nurs (Active)  01/19/23 1029  Location: Sacrum  Location Orientation: Medial  Staging: Unstageable - Full thickness tissue loss in which the base of the injury is covered by slough (yellow, tan, gray, green or brown) and/or eschar (tan, brown or black) in the wound bed.  Wound Description (Comments): patient seen in ED per WOC nursing  Present on Admission: Yes     Pressure Injury 01/23/23 Thigh Posterior;Proximal;Right Stage 2 -  Partial thickness loss of dermis presenting as a shallow open injury with a red, pink wound bed without slough. (Active)  01/23/23 0845  Location: Thigh  Location Orientation: Posterior;Proximal;Right  Staging: Stage 2 -  Partial thickness loss of dermis presenting as a shallow open injury with a red, pink wound bed without slough.  Wound Description (Comments):   Present on Admission:     Hypertension BP appropriate without meds         Consultants: Palliative care TOC team   Procedures: 01-21-2023 EEG with moderate to severe diffuse encephalopathy, no seizures or epileptiform discharges Cortrak placed 10//9 Cortrak removed 10/15   Antibiotics: Cefepime x 1 Ceftriaxone 10/1-8 Vancomycin 10/1-3   30 Day Unplanned Readmission Risk Score    Flowsheet Row ED to Hosp-Admission (Current) from 01/18/2023 in [facility]  30 Day Unplanned Readmission Risk Score (%) 23.25 Filed at 02/15/2023 0801       This score is the patient's risk of an unplanned readmission within 30 days of being discharged (0 -100%). The score is based on dignosis, age, lab data, medications, orders, and past utilization.   Low:  0-14.9   Medium: 15-21.9   High: 22-29.9   Extreme: 30 and above           Subjective: Mostly nonverbal.  Not making urine, little retained in bladder.  She is not taking in sufficient PO  intake to meet her nutritional needs.  Husband has declined G-tube.  Objective: Vitals:   02/15/23 0425 02/15/23 0744  BP: 127/69 (!) 118/58  Pulse: 95 98  Resp: 18 19  Temp: 98 F (36.7 C) 98.1 F (36.7 C)  SpO2: 100% 100%    Intake/Output Summary (Last 24 hours) at 02/15/2023 0821 Last data filed at 02/14/2023 1800 Gross per 24 hour  Intake 160 ml  Output --  Net 160 ml   Filed Weights   02/10/23 0518 02/11/23 0500 02/12/23 0500  Weight: 44 kg 44 kg 45 kg    Exam:  General:  Appears calm and comfortable and is in NAD, generally nonverbal but awake and alert, smiles Eyes:   normal lids, iris ENT:  grossly normal hearing, lips & tongue, mmm Neck:  no LAD, masses or thyromegaly Cardiovascular:  RRR, no m/r/g. No LE edema.  Respiratory:   CTA bilaterally with no wheezes/rales/rhonchi.  Normal respiratory effort. Abdomen:  soft, NT, ND Skin:  no rash or induration seen on limited exam Musculoskeletal:  hypertonic/contracted, mittens in place Psychiatric:  alert, mostly nonverbal with scant nonsensical speech Neurologic:  unable to effectively perform  Data Reviewed: I have reviewed the patient's lab results since admission.  Pertinent labs for today include:   None     Family Communication: None present  Disposition: Status is: Inpatient Remains inpatient appropriate because:  unsafe disposition     Time spent: 25 minutes  Unresulted Labs (From admission, onward)    None        Author: Jonah Blue, MD 02/15/2023 8:21 AM  For on call review [email or web address].

## 2023-02-15 NOTE — Consult Note (Signed)
WOC Nurse wound follow up; R foot HAPI  Wound type: Deep Tissue Pressure Injury R medial great toe intact blood filled blister; DTPI R heel  Measurement: 1.  DTPI R medial great toe 5 cm x 4 cm intact blood filled blister  2.  DTPI R heel 4 cm x 3 cm purple maroon discoloration evolving to brown eschar  Wound bed: as above  Drainage (amount, consistency, odor) none  Periwound: intact  Dressing procedure/placement/frequency: Continue with Xeroform daily to both wounds and place in Prevalon boot.   Patient is wearing Prevalon boots at the time of this visit and is on a low air loss mattress. WOC team will follow weekly for R foot wounds.   Thank you,    Priscella Mann MSN, RN-BC, [employer] [phone]

## 2023-02-15 NOTE — Progress Notes (Signed)
Physical Therapy Treatment Patient Details Name: Amanda [name] MRN: [id number] DOB: 1946-02-15 Today's Date: 02/15/2023   History of Present Illness Pt is a 77 year old woman admitted on 10/1 with AMS, poor PO intake, SIRS, hypernatremia, AKI secondary to dehydration. Pt + for DVT. Required cortrak due to dysphagia, now discontinued. PMH: dementia, sacral wound, PNA and UTI in June 2024.    PT Comments  Pt greeted resting in bed, awake and alert and engaging in session with music provided. Pt requiring total A to come to sitting up EOB with pt able to maintain at supervision level throughout session, tapping feet and hands along to music. Pt unable to follow commands for seated therex. Offered bites of breakfast tray and sips of juice while seated up EOB with pt keeping mouth closed and not accepting. Attempted standing with total A with pt resisting rise and unable to complete. Pt returning to supine with max A to complete and reposition in partial chair position in bed with call bell within reach and music playing through computer speaker. Pt continues to benefit from skilled PT services to progress toward functional mobility goals.     If plan is discharge home, recommend the following: Two people to help with walking and/or transfers;Two people to help with bathing/dressing/bathroom;Assistance with feeding;Assistance with cooking/housework   Can travel by private vehicle     No  Equipment Recommendations  None recommended by PT    Recommendations for Other Services       Precautions / Restrictions Precautions Precautions: Fall Restrictions Weight Bearing Restrictions: No     Mobility  Bed Mobility Overal bed mobility: Needs Assistance       Supine to sit: Total assist Sit to supine: Max assist   General bed mobility comments: total A to come to sitting EOB, pt assisting with lowering trunk to return to supine    Transfers Overall transfer level: Needs  assistance Equipment used: 1 person hand held assist (fac-to- face) Transfers: Sit to/from Stand Sit to Stand: Total assist           General transfer comment: unable to come to full stand, pt resisting rise and calling out    Ambulation/Gait                   Stairs             Wheelchair Mobility     Tilt Bed    Modified Rankin (Stroke Patients Only)       Balance Overall balance assessment: Needs assistance Sitting-balance support: Feet supported, Bilateral upper extremity supported Sitting balance-Leahy Scale: Fair Sitting balance - Comments: supervision for sitting up EOB with single UE support for etiretly of session     Standing balance-Leahy Scale: Zero                              Cognition Arousal: Alert Behavior During Therapy: Flat affect Overall Cognitive Status: History of cognitive impairments - at baseline                                 General Comments: non verbal, does not follow commands, laughing during session, tapping feet and hands to music once seated up EOB        Exercises Other Exercises Other Exercises: unable to follow commands to complete while seated up EOB    General Comments  General comments (skin integrity, edema, etc.): pt engaging with music during session tapping hands and feet to rhythm, offered pt bites of breakfast tray and sips of juice with pt keeping mouth closed and not accepting      Pertinent Vitals/Pain Pain Assessment Pain Assessment: Faces Faces Pain Scale: Hurts a little bit Pain Location: generalized with repositioning in bed Pain Descriptors / Indicators: Moaning Pain Intervention(s): Monitored during session, Limited activity within patient's tolerance, Repositioned    Home Living                          Prior Function            PT Goals (current goals can now be found in the care plan section) Acute Rehab PT Goals Patient Stated Goal: Pt  unable to state PT Goal Formulation: With family Time For Goal Achievement: 02/16/23 Progress towards PT goals: Progressing toward goals    Frequency    Min 1X/week      PT Plan      Co-evaluation              AM-PAC PT "6 Clicks" Mobility   Outcome Measure  Help needed turning from your back to your side while in a flat bed without using bedrails?: Total Help needed moving from lying on your back to sitting on the side of a flat bed without using bedrails?: Total Help needed moving to and from a bed to a chair (including a wheelchair)?: Total Help needed standing up from a chair using your arms (e.g., wheelchair or bedside chair)?: Total Help needed to walk in hospital room?: Total Help needed climbing 3-5 steps with a railing? : Total 6 Click Score: 6    End of Session   Activity Tolerance: Patient tolerated treatment well;Other (comment) (limited by inability to follow commands) Patient left: in bed;with call bell/phone within reach;with bed alarm set Nurse Communication: Mobility status PT Visit Diagnosis: Other abnormalities of gait and mobility (R26.89);Muscle weakness (generalized) (M62.81)     Time: 1610-9604 PT Time Calculation (min) (ACUTE ONLY): 20 min  Charges:    $Therapeutic Activity: 8-22 mins PT General Charges $$ ACUTE PT VISIT: 1 Visit                     [name] R. PTA Acute Rehabilitation Services Office: [phone]   Catalina Antigua 02/15/2023, 12:23 PM

## 2023-02-15 NOTE — Plan of Care (Signed)
  Problem: Metabolic: Goal: Ability to maintain appropriate glucose levels will improve Outcome: Progressing   Problem: Skin Integrity: Goal: Risk for impaired skin integrity will decrease Outcome: Progressing   Problem: Coping: Goal: Level of anxiety will decrease Outcome: Progressing   Problem: Safety: Goal: Ability to remain free from injury will improve Outcome: Progressing   Problem: Skin Integrity: Goal: Risk for impaired skin integrity will decrease Outcome: Progressing   Problem: Fluid Volume: Goal: Ability to maintain a balanced intake and output will improve Outcome: Not Progressing   Problem: Nutritional: Goal: Maintenance of adequate nutrition will improve Outcome: Not Progressing   Problem: Nutrition: Goal: Adequate nutrition will be maintained Outcome: Not Progressing

## 2023-02-16 DIAGNOSIS — G9341 Metabolic encephalopathy: Secondary | ICD-10-CM | POA: Diagnosis not present

## 2023-02-16 DIAGNOSIS — E870 Hyperosmolality and hypernatremia: Secondary | ICD-10-CM | POA: Diagnosis not present

## 2023-02-16 DIAGNOSIS — E878 Other disorders of electrolyte and fluid balance, not elsewhere classified: Secondary | ICD-10-CM | POA: Diagnosis not present

## 2023-02-16 DIAGNOSIS — N179 Acute kidney failure, unspecified: Secondary | ICD-10-CM | POA: Diagnosis not present

## 2023-02-16 LAB — GLUCOSE, CAPILLARY
Glucose-Capillary: 99 mg/dL (ref 70–99)
Glucose-Capillary: 89 mg/dL (ref 70–99)
Glucose-Capillary: 102 mg/dL — ABNORMAL HIGH (ref 70–99)

## 2023-02-16 NOTE — Plan of Care (Signed)
  Problem: Coping: Goal: Ability to adjust to condition or change in health will improve Outcome: Progressing   Problem: Health Behavior/Discharge Planning: Goal: Ability to identify and utilize available resources and services will improve Outcome: Progressing   Problem: Metabolic: Goal: Ability to maintain appropriate glucose levels will improve Outcome: Progressing   Problem: Nutritional: Goal: Maintenance of adequate nutrition will improve Outcome: Progressing Goal: Progress toward achieving an optimal weight will improve Outcome: Progressing

## 2023-02-16 NOTE — Progress Notes (Signed)
  Progress Note   Patient: Amanda [name] DUK:[id number] DOB: 1946/03/20 DOA: 01/18/2023     29 DOS: the patient was seen and examined on 02/16/2023 at 11:50AM      Brief hospital course: 77 y.o. F with dementia, living at home, pressure ulcer who presented with AMS, hypernatremia and AKI.  Started on tube feeds via Cortrak and developed diarrhea.  Enteral feeding did not improve mentation and Cortrak was removed.        Assessment and Plan: * Acute metabolic encephalopathy TSH, ammonia, B12, EEG, and neuraxial imaging normal.  Folate mildly low, supplemented.  No improvement with IV fluids, IV antibiotics, correction of sodium and uremia or enteral nutrition.   -Consult palliative care    Refeeding syndrome Resolved  SIRS (systemic inflammatory response syndrome) (HCC) Sepsis ruled out.  Hypernatremia Resolved  Decubitus ulcer Present prior to admission.   - Consult WOC   AKI (acute kidney injury) (HCC) Resolved  Protein-calorie malnutrition, severe - Continue nutritional supplements - Consult dietitian  Hypomagnesemia Resolved  Hypokalemia Resolved   Acute deep vein thrombosis (DVT) of left lower extremity (HCC) - Continue Eliquis  Moderate dementia (HCC) Fast 7  Hypertension Stable off meds          Subjective: Patient only speaks a few words, shakes her head no that she does not have pain.  No nursing concerns.     Physical Exam: BP 122/86 (BP Location: Right Arm)   Pulse 98   Temp 98 F (36.7 C) (Oral)   Resp 18   Wt 45 kg   SpO2 100%   BMI 16.51 kg/m   Elderly adult female, lying in bed, abulic, in no acute distress RRR, no murmurs, no peripheral edema Abdomen with grimace and guarding diffusely, no rigidity or rebound Respiratory exam normal Speaks only one-word answers, does not cooperate with exam, shakes her head no, then looks away   Data Reviewed: Glucose normal  Family Communication: None  present    Disposition: Status is: Inpatient The patient is fully dependent for all cares, she is at present without a safe disposition, and Child psychotherapist working with family to arrange outpatient care        Author: Alberteen Sam, MD 02/16/2023 2:40 PM  For on call review [email or web address].

## 2023-02-17 DIAGNOSIS — E870 Hyperosmolality and hypernatremia: Secondary | ICD-10-CM | POA: Diagnosis not present

## 2023-02-17 DIAGNOSIS — G9341 Metabolic encephalopathy: Secondary | ICD-10-CM | POA: Diagnosis not present

## 2023-02-17 DIAGNOSIS — E878 Other disorders of electrolyte and fluid balance, not elsewhere classified: Secondary | ICD-10-CM | POA: Diagnosis not present

## 2023-02-17 DIAGNOSIS — N179 Acute kidney failure, unspecified: Secondary | ICD-10-CM | POA: Diagnosis not present

## 2023-02-17 LAB — BASIC METABOLIC PANEL
Anion gap: 8 (ref 5–15)
BUN: 6 mg/dL — ABNORMAL LOW (ref 8–23)
CO2: 24 mmol/L (ref 22–32)
Calcium: 8.8 mg/dL — ABNORMAL LOW (ref 8.9–10.3)
Chloride: 107 mmol/L (ref 98–111)
Creatinine, Ser: 0.45 mg/dL (ref 0.44–1.00)
GFR, Estimated: >60 mL/min (ref >60)
Glucose, Bld: 97 mg/dL (ref 70–99)
Potassium: 3.6 mmol/L (ref 3.5–5.1)
Sodium: 139 mmol/L (ref 135–145)

## 2023-02-17 LAB — GLUCOSE, CAPILLARY
Glucose-Capillary: 89 mg/dL (ref 70–99)
Glucose-Capillary: 72 mg/dL (ref 70–99)
Glucose-Capillary: 100 mg/dL — ABNORMAL HIGH (ref 70–99)
Glucose-Capillary: 86 mg/dL (ref 70–99)

## 2023-02-17 LAB — CBC
HCT: 33.3 % — ABNORMAL LOW (ref 36.0–46.0)
Hemoglobin: 10.5 g/dL — ABNORMAL LOW (ref 12.0–15.0)
MCH: 25.5 pg — ABNORMAL LOW (ref 26.0–34.0)
MCHC: 31.5 g/dL (ref 30.0–36.0)
MCV: 81 fL (ref 80.0–100.0)
Platelets: 462 10*3/uL — ABNORMAL HIGH (ref 150–400)
RBC: 4.11 MIL/uL (ref 3.87–5.11)
RDW: 15.9 % — ABNORMAL HIGH (ref 11.5–15.5)
WBC: 4.4 10*3/uL (ref 4.0–10.5)
nRBC: 0 % (ref 0.0–0.2)

## 2023-02-17 NOTE — Progress Notes (Signed)
  Progress Note   Patient: Amanda [name] ZOX:[id number] DOB: 01/18/1946 DOA: 01/18/2023     30 DOS: the patient was seen and examined on 02/17/2023 at 8:38AM      Brief hospital course: 77 y.o. F with dementia, living at home, pressure ulcer who presented with AMS, hypernatremia and AKI.  Started on tube feeds via Cortrak and developed diarrhea.  Enteral feeding did not improve mentation and Cortrak was removed.            Assessment and Plan: * Acute metabolic encephalopathy Advanced dementia (HCC) Prior baseline FAST 6 (could communicate but dependent for all cares, could ambulate only short distances with assistance), but now progressed to being unable to stand, providing only 1 word answers.  TSH, ammonia, B12, EEG, and neuraxial imaging normal.  Folate mildly low, supplemented.  No improvement with IV fluids, IV antibiotics, correction of sodium and uremia or enteral nutrition.   -Consult palliative care     Acute deep vein thrombosis (DVT) of left lower extremity (HCC) - Continue Eliquis   Protein-calorie malnutrition, severe - Continue nutritional supplements - Consult dietitian    Refeeding syndrome Resolved   SIRS (systemic inflammatory response syndrome) (HCC) Sepsis ruled out.   Hypernatremia Resolved   Decubitus ulcer Present prior to admission.   - Consult WOC    AKI (acute kidney injury) (HCC) Resolved   Hypomagnesemia Resolved   Hypokalemia Resolved   Hypertension Stable off meds                   Subjective: Patient provides no meaningful history, no nursing concerns.         Physical Exam: BP (!) 146/74 (BP Location: Left Arm)   Pulse (!) 110   Temp 98.5 F (36.9 C) (Oral)   Resp 17   Wt 45 kg   SpO2 99%   BMI 16.51 kg/m   Elderly adult female, lying in bed, contractured, abulic, in no acute distress RRR, no murmurs, no peripheral edema Abdomen soft, no TTP, no distension Respiratory rate normal, lungs  clear Speaks only one-word answers, does not cooperate with exam      Data Reviewed: Glucose normal   Family Communication: None present       Disposition: Status is: Inpatient The patient is fully dependent for all cares, she is at present without a safe disposition, and Child psychotherapist working with family to arrange outpatient care                Author: Alberteen Sam, MD 02/17/2023 2:46 PM  For on call review [email or web address].

## 2023-02-17 NOTE — Progress Notes (Signed)
Physical Therapy Treatment Patient Details Name: Amanda [name] MRN: [id number] DOB: [birth year] Today's Date: 02/17/2023   History of Present Illness Pt is a 77 year old woman admitted on 10/1 with AMS, poor PO intake, SIRS, hypernatremia, AKI secondary to dehydration. Pt + for DVT. Required cortrak due to dysphagia, now discontinued. PMH: dementia, sacral wound, PNA and UTI in June 2024.    PT Comments  Pt received in bed and lethargic, had recently had bath with NT. Pt seems to like music being played. Needed max A to come to sitting EOB and intially with R lean and mod A but progressed to maintaining midline and CGA. Did some stretching to B knees for extension but pt grimaces with stretch. Gave tactile cues for standing but pt resistant and do not expect she could stand upright even if she did participate. Continue to recommend SNF vs LTAC. PT will continue to follow.     If plan is discharge home, recommend the following: Two people to help with walking and/or transfers;Two people to help with bathing/dressing/bathroom;Assistance with feeding;Assistance with cooking/housework   Can travel by private vehicle     No  Equipment Recommendations  None recommended by PT    Recommendations for Other Services       Precautions / Restrictions Precautions Precautions: Fall Restrictions Weight Bearing Restrictions: No     Mobility  Bed Mobility Overal bed mobility: Needs Assistance Bed Mobility: Rolling, Supine to Sit, Sit to Supine Rolling: Max assist   Supine to sit: Mod assist Sit to supine: Mod assist   General bed mobility comments: pt in R SL, needed max A for initiation of coming to sit, did manage trunk with coming up but needed max A LE's. Pt initiated return to supine, needed mod A for LE's    Transfers   Equipment used:  (face-to- face)               General transfer comment: gave pt visual and tactile cues for sit>stand but pt did not initiate and  resisted with manual facilitation so did not press this    Ambulation/Gait               General Gait Details: unable and B knees appear to have flexion contractures   Stairs             Wheelchair Mobility     Tilt Bed    Modified Rankin (Stroke Patients Only)       Balance Overall balance assessment: Needs assistance Sitting-balance support: Feet supported, Bilateral upper extremity supported Sitting balance-Leahy Scale: Fair Sitting balance - Comments: began needing min A to sit and with R lean but fair with increased time up                       High Level Balance Comments: wt shifting in sitting. Attempted to have pt perform reaching activities but pt not agreeable. PROM to BLE's in sitting            Cognition Arousal: Lethargic Behavior During Therapy: Flat affect Overall Cognitive Status: History of cognitive impairments - at baseline                                 General Comments: lethargic today but easilt arousable        Exercises      General Comments General comments (skin integrity, edema, etc.): music  played throughout session and pt seemed to like it and smiled a couple of time but otherwise did not really engage      Pertinent Vitals/Pain Pain Assessment Pain Assessment: Faces Faces Pain Scale: Hurts a little bit Breathing: normal Negative Vocalization: none Facial Expression: smiling or inexpressive Body Language: relaxed Consolability: no need to console PAINAD Score: 0 Pain Location: generalized with repositioning in bed Pain Descriptors / Indicators: Grimacing Pain Intervention(s): Limited activity within patient's tolerance, Repositioned    Home Living                          Prior Function            PT Goals (current goals can now be found in the care plan section) Acute Rehab PT Goals Patient Stated Goal: Pt unable to state PT Goal Formulation: Patient unable to  participate in goal setting Time For Goal Achievement: 03/03/23 Potential to Achieve Goals: Poor Progress towards PT goals: Progressing toward goals (very slowly)    Frequency    Min 1X/week      PT Plan      Co-evaluation              AM-PAC PT "6 Clicks" Mobility   Outcome Measure  Help needed turning from your back to your side while in a flat bed without using bedrails?: Total Help needed moving from lying on your back to sitting on the side of a flat bed without using bedrails?: Total Help needed moving to and from a bed to a chair (including a wheelchair)?: Total Help needed standing up from a chair using your arms (e.g., wheelchair or bedside chair)?: Total Help needed to walk in hospital room?: Total Help needed climbing 3-5 steps with a railing? : Total 6 Click Score: 6    End of Session Equipment Utilized During Treatment: Other (comment) (prevalon to LLE, RLE left off as she was laying on that side) Activity Tolerance: Patient tolerated treatment well;Other (comment) (limited by inability to follow commands) Patient left: in bed;with call bell/phone within reach;with bed alarm set Nurse Communication: Mobility status PT Visit Diagnosis: Other abnormalities of gait and mobility (R26.89);Muscle weakness (generalized) (M62.81)     Time: 1610-9604 PT Time Calculation (min) (ACUTE ONLY): 21 min  Charges:    $Therapeutic Activity: 8-22 mins PT General Charges $$ ACUTE PT VISIT: 1 Visit                     Lyanne Co, PT  Acute Rehab Services Secure chat preferred Office [phone]    Lawana Chambers Khila Papp 02/17/2023, 2:55 PM

## 2023-02-17 NOTE — Plan of Care (Signed)
?  Problem: Clinical Measurements: ?Goal: Respiratory complications will improve ?Outcome: Progressing ?Goal: Cardiovascular complication will be avoided ?Outcome: Progressing ?  ?Problem: Nutrition: ?Goal: Adequate nutrition will be maintained ?Outcome: Progressing ?  ?Problem: Safety: ?Goal: Ability to remain free from injury will improve ?Outcome: Progressing ?  ?

## 2023-02-18 DIAGNOSIS — G9341 Metabolic encephalopathy: Secondary | ICD-10-CM | POA: Diagnosis not present

## 2023-02-18 DIAGNOSIS — E870 Hyperosmolality and hypernatremia: Secondary | ICD-10-CM | POA: Diagnosis not present

## 2023-02-18 DIAGNOSIS — N179 Acute kidney failure, unspecified: Secondary | ICD-10-CM | POA: Diagnosis not present

## 2023-02-18 DIAGNOSIS — E878 Other disorders of electrolyte and fluid balance, not elsewhere classified: Secondary | ICD-10-CM | POA: Diagnosis not present

## 2023-02-18 LAB — GLUCOSE, CAPILLARY
Glucose-Capillary: 87 mg/dL (ref 70–99)
Glucose-Capillary: 102 mg/dL — ABNORMAL HIGH (ref 70–99)
Glucose-Capillary: 99 mg/dL (ref 70–99)
Glucose-Capillary: 140 mg/dL — ABNORMAL HIGH (ref 70–99)

## 2023-02-18 NOTE — Progress Notes (Signed)
  Progress Note   Patient: Amanda [name] ZOX:[id number] DOB: [birth year] DOA: 01/18/2023     31 DOS: the patient was seen and examined on 02/18/2023 at 9:10AM      Brief hospital course: 77 y.o. F with dementia, living at home, pressure ulcer who presented with AMS, hypernatremia and AKI.  Started on tube feeds via Cortrak and developed diarrhea.  Enteral feeding did not improve mentation and Cortrak was removed.            Assessment and Plan: * Acute metabolic encephalopathy Advanced dementia (HCC) Prior baseline FAST 6 (could communicate but dependent for all cares, could ambulate only short distances with assistance), but now progressed to being unable to stand, providing only 1 word answers.  TSH, ammonia, B12, EEG, and neuraxial imaging normal.  Folate mildly low, supplemented.  No improvement with IV fluids, IV antibiotics, correction of sodium and uremia or enteral nutrition.   -Consult palliative care     Acute deep vein thrombosis (DVT) of left lower extremity (HCC) - Continue Eliquis   Protein-calorie malnutrition, severe - Continue nutritional supplements and folate - Consult dietitian    Refeeding syndrome Resolved   SIRS (systemic inflammatory response syndrome) (HCC) Sepsis ruled out.   Hypernatremia Resolved   Decubitus ulcer Present prior to admission.   - Consult WOC    AKI (acute kidney injury) (HCC) Resolved   Hypomagnesemia Resolved   Hypokalemia Resolved   Hypertension Stable off meds                   Subjective: Patient provides no meaningful history, no nursing concerns.         Physical Exam: BP 119/61 (BP Location: Left Arm)   Pulse (!) 107   Temp 99.1 F (37.3 C) (Oral)   Resp 17   Wt 45 kg   SpO2 100%   BMI 16.51 kg/m   Elderly adult female, lying in bed, contractured, abulic, in no acute distress RRR, no murmurs, no peripheral edema Abdomen soft, no TTP, no distension Respiratory rate normal, lungs  clear Speaks only one-word answers, does not cooperate with exam      Data Reviewed: Glucose normal   Family Communication: None present       Disposition: Status is: Inpatient The patient is fully dependent for all cares, she is at present without a safe disposition, and Child psychotherapist working with family to arrange outpatient care                Author: Alberteen Sam, MD 02/18/2023 5:14 PM  For on call review [email or web address].

## 2023-02-18 NOTE — Plan of Care (Signed)
  Problem: Fluid Volume: Goal: Ability to maintain a balanced intake and output will improve Outcome: Progressing   Problem: Skin Integrity: Goal: Risk for impaired skin integrity will decrease Outcome: Progressing   Problem: Safety: Goal: Ability to remain free from injury will improve Outcome: Progressing

## 2023-02-18 NOTE — Plan of Care (Signed)
  Problem: Health Behavior/Discharge Planning: Goal: Ability to identify and utilize available resources and services will improve Outcome: Progressing   Problem: Metabolic: Goal: Ability to maintain appropriate glucose levels will improve Outcome: Progressing   Problem: Clinical Measurements: Goal: Ability to maintain clinical measurements within normal limits will improve Outcome: Progressing Goal: Will remain free from infection Outcome: Progressing

## 2023-02-19 DIAGNOSIS — E878 Other disorders of electrolyte and fluid balance, not elsewhere classified: Secondary | ICD-10-CM | POA: Diagnosis not present

## 2023-02-19 DIAGNOSIS — G9341 Metabolic encephalopathy: Secondary | ICD-10-CM | POA: Diagnosis not present

## 2023-02-19 DIAGNOSIS — N179 Acute kidney failure, unspecified: Secondary | ICD-10-CM | POA: Diagnosis not present

## 2023-02-19 DIAGNOSIS — E870 Hyperosmolality and hypernatremia: Secondary | ICD-10-CM | POA: Diagnosis not present

## 2023-02-19 LAB — GLUCOSE, CAPILLARY
Glucose-Capillary: 98 mg/dL (ref 70–99)
Glucose-Capillary: 114 mg/dL — ABNORMAL HIGH (ref 70–99)
Glucose-Capillary: 104 mg/dL — ABNORMAL HIGH (ref 70–99)
Glucose-Capillary: 118 mg/dL — ABNORMAL HIGH (ref 70–99)
Glucose-Capillary: 95 mg/dL (ref 70–99)

## 2023-02-19 NOTE — Plan of Care (Signed)
  Problem: Fluid Volume: Goal: Ability to maintain a balanced intake and output will improve Outcome: Progressing   Problem: Elimination: Goal: Will not experience complications related to bowel motility Outcome: Progressing   Problem: Pain Managment: Goal: General experience of comfort will improve Outcome: Progressing   Problem: Safety: Goal: Ability to remain free from injury will improve Outcome: Progressing

## 2023-02-19 NOTE — Progress Notes (Signed)
  Progress Note   Patient: Amanda [name] UUV:[id number] DOB: 07-23-1945 DOA: 01/18/2023     32 DOS: the patient was seen and examined on 02/19/2023 at 9:06 AM      Brief hospital course: 77 y.o. F with dementia, living at home, pressure ulcer who presented with AMS, hypernatremia and AKI.  Started on tube feeds via Cortrak and developed diarrhea.  Enteral feeding did not improve mentation and Cortrak was removed.            Assessment and Plan: * Acute metabolic encephalopathy Advanced dementia (HCC) Prior baseline FAST 6 (could communicate but dependent for all cares, could ambulate only short distances with assistance), but now progressed to being unable to stand, providing only 1 word answers.  TSH, ammonia, B12, EEG, and neuraxial imaging normal.  Folate mildly low, supplemented.  No improvement with IV fluids, IV antibiotics, correction of sodium and uremia or enteral nutrition.   -Consult palliative care     Acute deep vein thrombosis (DVT) of left lower extremity (HCC) - Continue Eliquis   Protein-calorie malnutrition, severe - Continue nutritional supplements and folate - Consult dietitian                   Subjective: Patient provides no meaningful history, no nursing concerns.         Physical Exam: BP 129/72 (BP Location: Left Arm)   Pulse (!) 102   Temp 97.9 F (36.6 C)   Resp 18   Wt 43 kg   SpO2 100%   BMI 15.78 kg/m   Elderly adult female, lying in bed, contractured, abulic, in no acute distress mostly does not cooperate with exam RRR, no murmurs, no peripheral edema Abdomen soft without grimace to palpation Respiratory rate normal, lungs diminished but no rales appreciated      Data Reviewed: Glucose normal   Family Communication: None present       Disposition: Status is: Inpatient The patient is fully dependent for all cares, she is at present without a safe disposition, and Child psychotherapist working with family to arrange  outpatient care            Author: Alberteen Sam, MD 02/19/2023 10:46 AM  For on call review [email or web address].

## 2023-02-19 NOTE — Progress Notes (Signed)
[facility] 2W[facility] Liaison Note   [facility] continues to follow for discharge disposition for hospice services at LTC.  Currently awaiting bed placement.   Please call with any hospice related questions or concerns. Thea Gist, BSN RN Hospice hospital liaison [phone]

## 2023-02-19 NOTE — Progress Notes (Signed)
Patient's son is at beside. He expresses immense concern that his mother is not eating or drinking. She turns to right as soon as staff attempt to reposition her. She turns face away when this RN and assigned CNA attempt to give her food or drink. She has allowed this RN to clean her mouth with suction swabs. Patient would not swallow her applesauce with meds this morning. This nurse ended up suctioning out since she would not swallow. Son wants this nurse and CNA to place patient supine, this nurse did so and patient still turned to right side. Patient's son called his sister [name], she confirmed that patient waxes and wanes. We will attempt to reposition and feed her supper.

## 2023-02-20 DIAGNOSIS — G9341 Metabolic encephalopathy: Secondary | ICD-10-CM | POA: Diagnosis not present

## 2023-02-20 DIAGNOSIS — N179 Acute kidney failure, unspecified: Secondary | ICD-10-CM | POA: Diagnosis not present

## 2023-02-20 DIAGNOSIS — E878 Other disorders of electrolyte and fluid balance, not elsewhere classified: Secondary | ICD-10-CM | POA: Diagnosis not present

## 2023-02-20 DIAGNOSIS — E870 Hyperosmolality and hypernatremia: Secondary | ICD-10-CM | POA: Diagnosis not present

## 2023-02-20 LAB — GLUCOSE, CAPILLARY
Glucose-Capillary: 97 mg/dL (ref 70–99)
Glucose-Capillary: 95 mg/dL (ref 70–99)
Glucose-Capillary: 125 mg/dL — ABNORMAL HIGH (ref 70–99)

## 2023-02-20 MED ORDER — POLYETHYLENE GLYCOL 3350 17 G PO PACK
17.0000 g | PACK | Freq: Every day | ORAL | Status: DC
  Administered 2023-02-21 – 2023-02-22 (×2): 17 g via ORAL
  Filled 2023-02-20 (×3): qty 1

## 2023-02-20 NOTE — Progress Notes (Signed)
  Progress Note   Patient: Amanda [name] ZOX:[id number] DOB: 1946-03-07 DOA: 01/18/2023     33 DOS: the patient was seen and examined on 02/20/2023 at 7:40AM      Brief hospital course: 77 y.o. F with dementia, living at home, pressure ulcer who presented with AMS, hypernatremia and AKI.  Started on tube feeds via Cortrak and did not tolerate them.  Enteral feeding did not improve mentation and Cortrak was removed.            Assessment and Plan: * Acute metabolic encephalopathy Advanced dementia (HCC) Prior baseline FAST 6 (could communicate but dependent for all cares, could ambulate only short distances with assistance), but now has progressed to being unable to stand, providing only 1 word answers.  An extensive work up for reversible causes of decreased mentation and oral intake was completed.  TSH, ammonia, B12, EEG, and neuraxial imaging with CT and MRI normal.  Folate mildly low, supplemented.  No improvement with IV fluids, IV antibiotics, correction of sodium and uremia or enteral nutrition.    The patient is terminal, and the patient likely has a prognosis of weeks to months.  My partner Dr. Ophelia Charter discussed with family that artificial nutrition had not improved her condition and her oral intake was not sufficient to sustain her fluid and calorie needs and recommended hospice.   - Consult to Hospice      Protein-calorie malnutrition, severe Dietitian has been involved throughout care.  Patient refuses nutrition supplements most days.   - Continue folate     Refeeding syndrome Resolved   Acute deep vein thrombosis (DVT) of left lower extremity (HCC) - Reasonable to continue Eliquis  SIRS (systemic inflammatory response syndrome) (HCC) Sepsis ruled out.   Hypernatremia Resolved   Decubitus ulcer Present prior to admission.   - Consult WOC    AKI (acute kidney injury) (HCC) Resolved   Hypomagnesemia Resolved   Hypokalemia Resolved    Hypertension Stable off meds                   Subjective: Patient provides no meaningful history, no nursing concerns.         Physical Exam: BP 114/63 (BP Location: Left Arm)   Pulse 100   Temp 98.5 F (36.9 C) (Oral)   Resp 14   Wt 43 kg   SpO2 100%   BMI 15.78 kg/m   Elderly adult female, lying in bed, contractured, abulic, in no acute distress RRR, no murmurs, no peripheral edema Abdomen soft, no TTP, no distension Respiratory rate normal, lungs clear Makes eye contact briefly.  Mumbles incoherently, does not follow commands.  Sluggish and contractured.     Data Reviewed: Glucose normal   Family Communication: None present       Disposition: Status is: Inpatient The patient is fully dependent for all cares, she is at present without a safe disposition, and social worker working with family to arrange outpatient care               Author: Alberteen Sam, MD 02/20/2023 11:31 AM  For on call review [email or web address].

## 2023-02-20 NOTE — Progress Notes (Signed)
[facility] 2W06 [facility] Liaison Note  [facility] continues to follow for discharge disposition for hospice services at LTC.  Patient awaiting bed placement.  Please don't hesitate to call with hospice related questions or concerns.  Doreatha Martin, RN, [facility] [phone]

## 2023-02-21 DIAGNOSIS — E878 Other disorders of electrolyte and fluid balance, not elsewhere classified: Secondary | ICD-10-CM | POA: Diagnosis not present

## 2023-02-21 DIAGNOSIS — G9341 Metabolic encephalopathy: Secondary | ICD-10-CM | POA: Diagnosis not present

## 2023-02-21 DIAGNOSIS — N179 Acute kidney failure, unspecified: Secondary | ICD-10-CM | POA: Diagnosis not present

## 2023-02-21 DIAGNOSIS — E870 Hyperosmolality and hypernatremia: Secondary | ICD-10-CM | POA: Diagnosis not present

## 2023-02-21 LAB — GLUCOSE, CAPILLARY
Glucose-Capillary: 100 mg/dL — ABNORMAL HIGH (ref 70–99)
Glucose-Capillary: 120 mg/dL — ABNORMAL HIGH (ref 70–99)

## 2023-02-21 NOTE — Plan of Care (Signed)

## 2023-02-21 NOTE — Consult Note (Signed)
WOC Nurse wound follow up Wound type:pressure injury to right heel and right great lateral toe head.  Measurement: Right heel:  4 cm x 4 cm darkened area with peeling epithelium Right great toe:  2 cm x 1.5 cm raised eschar Wound bed: eschar Drainage (amount, consistency, odor) minimal serosanguinous   Periwound: intact  at risk Dressing procedure/placement/frequency: Cleanse wounds to right great toe and right heel with VASHE cleanser.  Apply VASHE moist gauze to wound bed. Cover with foam dressing.  Change daily.  Will follow.  Mike Gip MSN, RN, FNP-BC CWON Wound, Ostomy, Continence Nurse Outpatient [facility] [phone] Pager [phone]

## 2023-02-21 NOTE — Progress Notes (Signed)
[facility] Liaison Note   [facility] continues to follow for discharge disposition for hospice services at LTC.  Currently awaiting bed placement.   Please call with any hospice related questions or concerns.  Glenna Fellows, BSN, RN, [facility] [phone]

## 2023-02-21 NOTE — Progress Notes (Signed)
  Progress Note   Patient: Amanda [name] ZOX:[id number] DOB: 1945/06/25 DOA: 01/18/2023     34 DOS: the patient was seen and examined on 02/21/2023 at 10:18AM      Brief hospital course: 77 y.o. F with dementia, living at home, pressure ulcer who presented with AMS, hypernatremia and AKI.              Assessment and Plan: * Acute metabolic encephalopathy Advanced dementia (HCC) - Supportive cares - Plan for hospice at discharge, confirmed with TOC today      Protein-calorie malnutrition, severe -Continue thiamine and nutritional supplements as able, patient generally unwilling to take   Acute deep vein thrombosis (DVT) of left lower extremity (HCC) - Reasonable to continue Eliquis                 Subjective: Patient provides no meaningful history, no nursing concerns.         Physical Exam: BP 119/83 (BP Location: Right Arm)   Pulse 99   Temp 97.9 F (36.6 C) (Oral)   Resp 16   Wt 40 kg   SpO2 99%   BMI 14.67 kg/m   Elderly adult female, lying in bed, contractured, abulic, in no acute distress RRR, no murmurs, no peripheral edema Abdomen soft, no TTP, no distension Respiratory rate normal, lungs clear Speaks only one-word answers, does not cooperate with exam      Data Reviewed:     Family Communication: None present       Disposition: Status is: Inpatient The patient is fully dependent for all cares, she is at present without a safe disposition, and social worker working with family to arrange outpatient care   Hospice are involved, and it is my recommendation at discharge that she enroll in hospice as previously agreed upon with family (see palliative care note 10/27)              Author: Alberteen Sam, MD 02/21/2023 10:54 AM  For on call review [email or web address].

## 2023-02-22 DIAGNOSIS — E870 Hyperosmolality and hypernatremia: Secondary | ICD-10-CM | POA: Diagnosis not present

## 2023-02-22 DIAGNOSIS — G9341 Metabolic encephalopathy: Secondary | ICD-10-CM | POA: Diagnosis not present

## 2023-02-22 DIAGNOSIS — E878 Other disorders of electrolyte and fluid balance, not elsewhere classified: Secondary | ICD-10-CM | POA: Diagnosis not present

## 2023-02-22 DIAGNOSIS — N179 Acute kidney failure, unspecified: Secondary | ICD-10-CM | POA: Diagnosis not present

## 2023-02-22 LAB — GLUCOSE, CAPILLARY
Glucose-Capillary: 112 mg/dL — ABNORMAL HIGH (ref 70–99)
Glucose-Capillary: 110 mg/dL — ABNORMAL HIGH (ref 70–99)
Glucose-Capillary: 100 mg/dL — ABNORMAL HIGH (ref 70–99)
Glucose-Capillary: 105 mg/dL — ABNORMAL HIGH (ref 70–99)

## 2023-02-22 NOTE — Plan of Care (Signed)

## 2023-02-22 NOTE — Progress Notes (Signed)
  Progress Note   Patient: Amanda [name]:[id number] DOB: 01-28-1946 DOA: 01/18/2023     35 DOS: the patient was seen and examined on 02/22/2023 at 11:17AM      Brief hospital course: 77 y.o. F with dementia, living at home, pressure ulcer who presented with AMS, hypernatremia and AKI.              Assessment and Plan: * Acute metabolic encephalopathy Advanced dementia (HCC) Prior baseline FAST 6 (could communicate but dependent for all cares, could ambulate only short distances with assistance), but now has progressed to being unable to stand, providing only 1 word answers.  On admission, an extensive work up for reversible causes of decreased mentation and oral intake was completed.  TSH, ammonia, B12, EEG, and neuraxial imaging with CT and MRI normal.  Folate mildly low, supplemented.  No improvement with IV fluids, IV antibiotics, correction of sodium and uremia or enteral nutrition.    The patient is terminal, and the patient likely has a prognosis of weeks to months.  Previous providers have discussed with family that artificial nutrition had not improved her condition and her oral intake was not sufficient to sustain her fluid and calorie needs and recommended hospice (see palliative Care note 10/26).  Family are working with [facility] to arrange discharge in LTC. - Consult to Hospice    Acute deep vein thrombosis (DVT) of left lower extremity (HCC) - Reasonable to continue Eliquis   Protein-calorie malnutrition, severe Dietitian has been involved throughout care.  Patient refuses nutrition supplements most days and also refuses most oral intake.   - Continue folate as able   Refeeding syndrome Resolved  SIRS (systemic inflammatory response syndrome) (HCC) Sepsis ruled out.   Hypernatremia Resolved   Decubitus ulcer Present prior to admission.   - Consult WOC    AKI (acute kidney injury) (HCC) Resolved   Hypomagnesemia Resolved   Hypokalemia Resolved    Hypertension Stable off meds   Sinus tachycardia Persistent, asyptomatic               Subjective: Patient provides no meaningful history, no nursing concerns.         Physical Exam: BP 132/74   Pulse (!) 110   Temp 99.2 F (37.3 C) (Oral)   Resp 17   Ht 5\' 5"  (1.651 m)   Wt 45 kg   SpO2 99%   BMI 16.51 kg/m   Elderly adult female, lying in bed, contractured, abulic, in no acute distress RRR, no murmurs, no peripheral edema Abdomen soft, no TTP, no distension Respiratory rate normal, lungs clear Does not respond to questions today, does not cooperate with exam      Data Reviewed: Glucose normal   Family Communication: None present       Disposition: Status is: Inpatient The patient is fully dependent for all cares, she is not able to participate with therapy and has a terminal prognosis.    she is at present without a safe disposition, and Child psychotherapist working with family to arrange outpatient care in a longterm care setting   Hospice are involved, and it is my recommendation at discharge that she enroll in hospice             Author: Alberteen Sam, MD 02/22/2023 12:19 PM  For on call review [email or web address].

## 2023-02-22 NOTE — Progress Notes (Signed)
Physical Therapy Treatment Patient Details Name: Amanda [name] MRN: [id number] DOB: 1945/10/25 Today's Date: 02/22/2023   History of Present Illness Pt is a 77 year old woman admitted on 10/1 with AMS, poor PO intake, SIRS, hypernatremia, AKI secondary to dehydration. Pt + for DVT. Required cortrak due to dysphagia, now discontinued. PMH: dementia, sacral wound, PNA and UTI in June 2024.    PT Comments  Pt received in supine and appearing anxious throughout session. Pt requiring total A for bed mobility and mod A to maintain sitting EOB. Pt grabbing onto therapist during mobility tasks despite cues for technique and sequencing. Pt unable to follow any commands to assist with mobility tasks this session and resists intermittently. Pt continues to benefit from PT services to progress toward functional mobility goals.    If plan is discharge home, recommend the following: Two people to help with walking and/or transfers;Two people to help with bathing/dressing/bathroom;Assistance with feeding;Assistance with cooking/housework   Can travel by private vehicle     No  Equipment Recommendations  None recommended by PT    Recommendations for Other Services       Precautions / Restrictions Precautions Precautions: Fall Restrictions Weight Bearing Restrictions: No     Mobility  Bed Mobility Overal bed mobility: Needs Assistance Bed Mobility: Rolling, Supine to Sit, Sit to Supine Rolling: Max assist   Supine to sit: Total assist Sit to supine: Total assist   General bed mobility comments: Pt not following any commands and requires total A for all aspects of bed mobility. pt able to maintain sitting balance with HHA    Transfers                   General transfer comment: deferred for safety         Balance Overall balance assessment: Needs assistance Sitting-balance support: Feet supported, Bilateral upper extremity supported Sitting balance-Leahy Scale:  Poor Sitting balance - Comments: pt requiring mod A via HHA to maintain balance                                    Cognition Arousal: Alert Behavior During Therapy: Flat affect, Anxious Overall Cognitive Status: History of cognitive impairments - at baseline                                 General Comments: Pt not following any simple commands this session        Exercises      General Comments        Pertinent Vitals/Pain Pain Assessment Pain Assessment: Faces Faces Pain Scale: Hurts a little bit Pain Location: generalized Pain Descriptors / Indicators: Grimacing Pain Intervention(s): Limited activity within patient's tolerance, Repositioned     PT Goals (current goals can now be found in the care plan section) Acute Rehab PT Goals Patient Stated Goal: Pt unable to state PT Goal Formulation: Patient unable to participate in goal setting Time For Goal Achievement: 03/03/23 Progress towards PT goals: Not progressing toward goals - comment (limited by impaired cognition)    Frequency    Min 1X/week       AM-PAC PT "6 Clicks" Mobility   Outcome Measure  Help needed turning from your back to your side while in a flat bed without using bedrails?: Total Help needed moving from lying on your back to sitting on  the side of a flat bed without using bedrails?: Total Help needed moving to and from a bed to a chair (including a wheelchair)?: Total Help needed standing up from a chair using your arms (e.g., wheelchair or bedside chair)?: Total Help needed to walk in hospital room?: Total Help needed climbing 3-5 steps with a railing? : Total 6 Click Score: 6    End of Session   Activity Tolerance: Other (comment) (inability to follow commands) Patient left: in bed;with call bell/phone within reach;with bed alarm set Nurse Communication: Mobility status PT Visit Diagnosis: Other abnormalities of gait and mobility (R26.89);Muscle weakness  (generalized) (M62.81)     Time: 6962-9528 PT Time Calculation (min) (ACUTE ONLY): 13 min  Charges:    $Therapeutic Activity: 8-22 mins PT General Charges $$ ACUTE PT VISIT: 1 Visit                     Johny Shock, PTA Acute Rehabilitation Services Secure Chat Preferred  [phone]    Johny Shock 02/22/2023, 3:58 PM

## 2023-02-22 NOTE — Progress Notes (Signed)
Nutrition Follow-up  DOCUMENTATION CODES:   Severe malnutrition in context of chronic illness  INTERVENTION:  Nutrition Brief Note  Chart reviewed. Pt now waiting for bed in hospice services at LTC No further nutrition interventions planned at this time.  Please re-consult as needed.       NUTRITION DIAGNOSIS:   Severe Malnutrition related to chronic illness (moderate dementia) as evidenced by severe fat depletion, severe muscle depletion. Ongoing    GOAL:   Patient will meet greater than or equal to 90% of their needs    MONITOR:   PO intake, Labs, Weight trends, Diet advancement, TF tolerance  REASON FOR ASSESSMENT:   Consult Calorie Count  ASSESSMENT:   Pt admitted with AMS and HTN. PMH significant for moderate dementia.  Review of EMR revealed. Patient continues to have poor oral intake, ~ 20% of meal completion when she does eat meals.Will barely drink supplements. GOC is to transition to LTC with Hospice pending bed.   NUTRITION - FOCUSED PHYSICAL EXAM:  Flowsheet Row Most Recent Value  Orbital Region Moderate depletion  Upper Arm Region Severe depletion  Thoracic and Lumbar Region Severe depletion  Buccal Region Severe depletion  Temple Region Moderate depletion  Clavicle Bone Region Severe depletion  Clavicle and Acromion Bone Region Severe depletion  Scapular Bone Region Severe depletion  Dorsal Hand Unable to assess  [in mits]  Patellar Region Moderate depletion  Anterior Thigh Region Moderate depletion  Posterior Calf Region Moderate depletion  Edema (RD Assessment) None  Hair Reviewed  Eyes Unable to assess  Mouth Unable to assess  Skin Reviewed  Nails Unable to assess       Diet Order:   Diet Order             DIET - DYS 1 Room service appropriate? No; Fluid consistency: Thin  Diet effective now                   EDUCATION NEEDS:   Education needs have been addressed  Skin:  Skin Assessment: Reviewed RN  Assessment Skin Integrity Issues:: DTI, Stage II DTI: Sacrum Stage II: Right posterior thigh  Last BM:  02/12/2023  Height:   Ht Readings from Last 1 Encounters:  10/08/22 5\' 5"  (1.651 m)    Weight:   Wt Readings from Last 1 Encounters:  02/22/23 45 kg    Ideal Body Weight:     BMI:  Body mass index is 16.51 kg/m.  Estimated Nutritional Needs:   Kcal:  1400-1600  Protein:  70-85g  Fluid:  >/=1.5L    Jamelle Haring RDN, LDN Clinical Dietitian  RDN pager # available on Amion

## 2023-02-22 NOTE — Progress Notes (Signed)
Sent Dr. Maryfrances Bunnell a chat regarding patient being a [place] due to her Pulse being 115.

## 2023-02-23 DIAGNOSIS — F03B0 Unspecified dementia, moderate, without behavioral disturbance, psychotic disturbance, mood disturbance, and anxiety: Secondary | ICD-10-CM | POA: Diagnosis not present

## 2023-02-23 DIAGNOSIS — I82412 Acute embolism and thrombosis of left femoral vein: Secondary | ICD-10-CM | POA: Diagnosis not present

## 2023-02-23 DIAGNOSIS — G9341 Metabolic encephalopathy: Secondary | ICD-10-CM | POA: Diagnosis not present

## 2023-02-23 LAB — GLUCOSE, CAPILLARY
Glucose-Capillary: 96 mg/dL (ref 70–99)
Glucose-Capillary: 82 mg/dL (ref 70–99)
Glucose-Capillary: 91 mg/dL (ref 70–99)
Glucose-Capillary: 192 mg/dL — ABNORMAL HIGH (ref 70–99)

## 2023-02-23 NOTE — Plan of Care (Signed)

## 2023-02-23 NOTE — Progress Notes (Signed)
  Progress Note   Patient: Amanda [name] WUJ:[id number] DOB: 04-14-1946 DOA: 01/18/2023     36 DOS: the patient was seen and examined on 02/23/2023   Brief hospital course: 77 y.o. F with dementia, living at home, pressure ulcer who presented with AMS, hypernatremia and AKI.       Assessment and Plan: * Acute metabolic encephalopathy Advanced dementia (HCC) Prior baseline FAST 6 (could communicate but dependent for all cares, could ambulate only short distances with assistance), but now has progressed to being unable to stand, providing only 1 word answers. On admission, an extensive work up for reversible causes of decreased mentation and oral intake was completed.  TSH, ammonia, B12, EEG, and neuraxial imaging with CT and MRI normal.  Folate mildly low, supplemented.  No improvement with IV fluids, IV antibiotics, correction of sodium and uremia or enteral nutrition.   The patient is terminal, and the patient likely has a prognosis of weeks to months.  Previous providers have discussed with family that artificial nutrition had not improved her condition and her oral intake was not sufficient to sustain her fluid and calorie needs and recommended hospice (see palliative Care note 10/26).  Family are working with [facility] to arrange discharge in LTC.  Consult to Hospice     Acute deep vein thrombosis (DVT) of left lower extremity (HCC) Continue Eliquis   Protein-calorie malnutrition, severe Dietitian has been involved throughout care.  Patient refuses nutrition supplements most days and also refuses most oral intake.   Continue folate as able   Refeeding syndrome -- Resolved   SIRS (systemic inflammatory response syndrome) (HCC) Sepsis ruled out.   Hypernatremia -- Resolved   Decubitus ulcer POA WOC    AKI -- Resolved   Hypomagnesemia--resolved  Hypokalemia--resolved  Essential hypertension Stable off meds   Sinus tachycardia Persistent, asyptomatic      Subjective:   Nonverbal   Physical Exam: Vitals:   02/22/23 1756 02/22/23 1829 02/23/23 0331 02/23/23 0744  BP: 132/80 101/67 116/69 (!) 101/46  Pulse: (!) 116 (!) 115 (!) 114 (!) 107  Resp:   18   Temp:   98.7 F (37.1 C) 99.2 F (37.3 C)  TempSrc:    Oral  SpO2: 100% 100% 100% 100%  Weight:      Height:       Physical Exam Vitals reviewed.  Constitutional:      General: She is not in acute distress.    Appearance: She is not ill-appearing or toxic-appearing.  Cardiovascular:     Rate and Rhythm: Normal rate and regular rhythm.     Heart sounds: No murmur heard. Pulmonary:     Effort: Pulmonary effort is normal. No respiratory distress.     Breath sounds: No wheezing, rhonchi or rales.  Abdominal:     Palpations: Abdomen is soft.  Musculoskeletal:     Right lower leg: No edema.     Left lower leg: No edema.  Neurological:     Mental Status: She is alert.  Psychiatric:     Comments: Cannot assess     Data Reviewed: CBG stable  Family Communication: none present  Disposition: Status is: Inpatient Remains inpatient appropriate because: await safe discharge     Time spent: 35 minutes  Author: Brendia Sacks, MD 02/23/2023 1:48 PM  For on call review [email or web address].

## 2023-02-24 DIAGNOSIS — F03B0 Unspecified dementia, moderate, without behavioral disturbance, psychotic disturbance, mood disturbance, and anxiety: Secondary | ICD-10-CM | POA: Diagnosis not present

## 2023-02-24 DIAGNOSIS — E43 Unspecified severe protein-calorie malnutrition: Secondary | ICD-10-CM | POA: Diagnosis not present

## 2023-02-24 LAB — GLUCOSE, CAPILLARY
Glucose-Capillary: 98 mg/dL (ref 70–99)
Glucose-Capillary: 88 mg/dL (ref 70–99)
Glucose-Capillary: 75 mg/dL (ref 70–99)
Glucose-Capillary: 132 mg/dL — ABNORMAL HIGH (ref 70–99)

## 2023-02-24 MED ORDER — POLYETHYLENE GLYCOL 3350 17 G PO PACK
17.0000 g | PACK | Freq: Two times a day (BID) | ORAL | Status: DC
  Administered 2023-02-25 – 2023-03-01 (×5): 17 g via ORAL
  Filled 2023-02-24 (×10): qty 1

## 2023-02-24 NOTE — TOC Progression Note (Signed)
Transition of Care [facility]) - Progression Note    Patient Details  Name: Amanda [name] MRN: [id number] Date of Birth: 1946-03-17  Transition of Care [facility]) CM/SW Contact  Janae Bridgeman, RN Phone Number: 02/24/2023, 4:38 PM  Clinical Narrative:    CM met with the patient, husband and daughter at the bedside to discuss patient returning home with caregiver.  The patient's husband states that he has spoken with [facility] senior Care and they can provide 24 hour care at the home if needed and plan to visit at the bedside with the patient tomorrow.  The patient's husband and daughter were agreeable to home hospice services through [facility] home hospice.  Patient will need a hospital bed with air mattress for home.  I called Thea Gist, RNCM with [facility] and she plans to reach out to the family to coordinate deliver of the hospital bed and air mattress, bedside table in the next 1-2 days.  Patient will need PTAR home  - likely over the weekend once Personal care services have been set up.   Expected Discharge Plan: Home w Hospice Care Barriers to Discharge: No Barriers Identified (waiting on family to set up 24 hour care through [facility] [employer])  Expected Discharge Plan and Services   Discharge Planning Services: CM Consult Post Acute Care Choice: Hospice Living arrangements for the past 2 months: Single Family Home                 DME Arranged: Hospital bed           [facility] Agency:  [facility]) Date [facility] Agency Contacted: 02/24/23 Time HH Agency Contacted: 1636 Representative spoke with at [facility] Agency: [facility]   Social Determinants of Health (SDOH) Interventions SDOH Screenings   Food Insecurity: No Food Insecurity (10/09/2022)  Housing: Patient Unable To Answer (10/18/2022)  Transportation Needs: No Transportation Needs (10/09/2022)  Utilities: Not At Risk (10/09/2022)  Depression (PHQ2-9): Low Risk  (09/14/2022)  Financial  Resource Strain: Low Risk  (09/14/2022)  Physical Activity: Inactive (09/14/2022)  Stress: No Stress Concern Present (09/14/2022)  Tobacco Use: Low Risk  (01/18/2023)    Readmission Risk Interventions    02/24/2023    4:38 PM 02/07/2023    4:43 PM  Readmission Risk Prevention Plan  Transportation Screening Complete Complete  PCP or Specialist Appt within 5-7 Days Complete   PCP or Specialist Appt within 3-5 Days  Complete  Home Care Screening Complete   Medication Review (RN CM) Complete   HRI or Home Care Consult  Complete  Social Work Consult for Recovery Care Planning/Counseling  Complete  Palliative Care Screening  Complete  Medication Review [employer])  Complete

## 2023-02-24 NOTE — Plan of Care (Signed)

## 2023-02-24 NOTE — Progress Notes (Addendum)
Progress Note   Patient: Amanda [name] [id number]:[id number] DOB: 04-16-1946 DOA: 01/18/2023     38 DOS: the patient was seen and examined on 02/25/2023   Brief hospital course: 77 year old woman PMH dementia, living at home, presented with acute metabolic encephalopathy.  Found to have DVT and placed on apixaban.  Noted to have severe protein calorie malnutrition.  Despite extensive workup, no reversible etiology was found and ultimately her condition felt to be secondary to progressive dementia.  Trial of tube feeds was pursued, family does not want PEG tube.  Patient unable to maintain nutrition and hospice recommended.  Long-term care was pursued.  Appears at this time the patient will be going home next week once all equipment is in place.   Consultants Palliative medicine   Procedures None  Assessment and Plan: * Acute metabolic encephalopathy Advanced dementia (HCC) Prior baseline FAST 6 (could communicate but dependent for all cares, could ambulate only short distances with assistance), but now has progressed to being unable to stand, providing only 1 word answers. On admission, an extensive work up for reversible causes of decreased mentation and oral intake was completed.  TSH, ammonia, B12, EEG, and neuraxial imaging with CT and MRI normal.  Folate mildly low, supplemented.  No improvement with IV fluids, IV antibiotics, correction of sodium and uremia or enteral nutrition.   The patient is terminal, and the patient likely has a prognosis of weeks to months.  Previous providers have discussed with family that artificial nutrition had not improved her condition and her oral intake was not sufficient to sustain her fluid and calorie needs and recommended hospice (see palliative Care note 10/26).  Family were working with [facility] to arrange discharge in LTC. Consult to Hospice was placed. However at this point plans are for the patient to return home with home hospice and 24 care services in  the next several days once equipment is delivered.    Acute deep vein thrombosis (DVT) of left lower extremity (HCC) Continue Eliquis   Protein-calorie malnutrition, severe Dietitian involved throughout care.  Patient refuses nutrition supplements most days and also refuses most oral intake.   Continue folate as able   Refeeding syndrome -- Resolved   SIRS (systemic inflammatory response syndrome) (HCC) Sepsis ruled out.   Hypernatremia -- Resolved   Decubitus ulcer POA Right heel pressure injury, right great lateral toe head pressure injury WOC    AKI -- Resolved   Hypomagnesemia--resolved  Hypokalemia--resolved   Essential hypertension Stable off meds   Sinus tachycardia Persistent, asyptomatic      Subjective:  Nonverbal   Physical Exam: Vitals:   02/25/23 0523 02/25/23 0713 02/25/23 0736 02/25/23 1610  BP: 129/76  110/70 (!) 124/91  Pulse: (!) 102  99   Resp: 18  16 16   Temp: 97.7 F (36.5 C)  97.8 F (36.6 C) 97.9 F (36.6 C)  TempSrc: Oral     SpO2: 100%  100% 100%  Weight:  42 kg    Height:       Physical Exam Vitals reviewed.  Constitutional:      General: She is not in acute distress.    Appearance: She is not ill-appearing or toxic-appearing.  Cardiovascular:     Rate and Rhythm: Normal rate and regular rhythm.     Heart sounds: No murmur heard. Pulmonary:     Effort: Pulmonary effort is normal. No respiratory distress.     Breath sounds: No wheezing, rhonchi or rales.  Neurological:  Mental Status: She is alert.     Data Reviewed: CBG stable  Family Communication: none present  Disposition: Status is: Inpatient Remains inpatient appropriate because: as aobve     Time spent: 20 minutes  Author: Brendia Sacks, MD 02/25/2023 7:43 PM  For on call review [email or web address].

## 2023-02-25 DIAGNOSIS — F03B0 Unspecified dementia, moderate, without behavioral disturbance, psychotic disturbance, mood disturbance, and anxiety: Secondary | ICD-10-CM | POA: Diagnosis not present

## 2023-02-25 LAB — GLUCOSE, CAPILLARY
Glucose-Capillary: 89 mg/dL (ref 70–99)
Glucose-Capillary: 102 mg/dL — ABNORMAL HIGH (ref 70–99)
Glucose-Capillary: 101 mg/dL — ABNORMAL HIGH (ref 70–99)
Glucose-Capillary: 99 mg/dL (ref 70–99)

## 2023-02-25 NOTE — Progress Notes (Signed)
Physical Therapy Treatment Patient Details Name: Amanda [name] MRN: [id number] DOB: 04/24/1945 Today's Date: 02/25/2023   History of Present Illness Pt is a 77 year old woman admitted on 10/1 with AMS, poor PO intake, SIRS, hypernatremia, AKI secondary to dehydration. Pt + for DVT. Required cortrak due to dysphagia, now discontinued. PMH: dementia, sacral wound, PNA and UTI in June 2024.    PT Comments  Pt continues to be limited by impaired cognition and inability to follow commands for functional mobility, however pt tolerating session well this date. Pt continues to require total A to come to sitting EOB as pt unable to follow commands for reaching for rail to assist. Once seated up EOB pt able to maintain with CGA for safety ~36mins with music playing throughout and pt engaging in weight shifting and PROM to all UE joints with pt smiling and laughing. Pt initiating return to supine, lowering trunk due to suspected fatigue. pt positioned in chair position in bed at end of session. Breakfast tray arriving during session with pt accepting a few bites, RN notified as pt needing assist for feeding. Pt continues to benefit from skilled PT services to progress toward functional mobility goals.      If plan is discharge home, recommend the following: Two people to help with walking and/or transfers;Two people to help with bathing/dressing/bathroom;Assistance with feeding;Assistance with cooking/housework   Can travel by private vehicle     No  Equipment Recommendations  None recommended by PT    Recommendations for Other Services       Precautions / Restrictions Precautions Precautions: Fall Restrictions Weight Bearing Restrictions: No     Mobility  Bed Mobility Overal bed mobility: Needs Assistance Bed Mobility: Rolling, Supine to Sit, Sit to Supine Rolling: Total assist Sidelying to sit: Total assist   Sit to supine: Max assist   General bed mobility comments: Pt not  following any commands and requires total A for all aspects of bed mobility. max A to return to supine as pt fatigued and inititaing descent    Transfers                   General transfer comment: deferred for safety    Ambulation/Gait               General Gait Details: unable and B knees appear to have flexion contractures   Stairs             Wheelchair Mobility     Tilt Bed    Modified Rankin (Stroke Patients Only)       Balance Overall balance assessment: Needs assistance Sitting-balance support: Feet supported, Bilateral upper extremity supported Sitting balance-Leahy Scale: Fair Sitting balance - Comments: pt able to maintain sitting balance with CGA for safety     Standing balance-Leahy Scale: Zero                 High Level Balance Comments: wt shifting in sitting with PROM to UEs            Cognition Arousal: Alert Behavior During Therapy: Flat affect, Anxious Overall Cognitive Status: History of cognitive impairments - at baseline                                 General Comments: pt unable to follow commands for purposful mobility, laughing and responding to music "dancing" while seated EOB  Exercises Other Exercises Other Exercises: PROM elbow flex/ext, internal/external shoulder rotation, should flexion/ext seated up EOB, x5 mins Other Exercises: warm washcloth provided for pt to wash face, pt needing total A to complete    General Comments General comments (skin integrity, edema, etc.): music played throughout session with pt smiling, laughing and "dancing" while seated up EOB      Pertinent Vitals/Pain Pain Assessment Pain Assessment: Faces Faces Pain Scale: Hurts a little bit Pain Location: generalized with return to supine Pain Descriptors / Indicators: Grimacing Pain Intervention(s): Repositioned    Home Living                          Prior Function            PT  Goals (current goals can now be found in the care plan section) Acute Rehab PT Goals Patient Stated Goal: Pt unable to state PT Goal Formulation: Patient unable to participate in goal setting Time For Goal Achievement: 03/03/23 Progress towards PT goals: Not progressing toward goals - comment (impaired cog)    Frequency    Min 1X/week      PT Plan      Co-evaluation              AM-PAC PT "6 Clicks" Mobility   Outcome Measure  Help needed turning from your back to your side while in a flat bed without using bedrails?: Total Help needed moving from lying on your back to sitting on the side of a flat bed without using bedrails?: Total Help needed moving to and from a bed to a chair (including a wheelchair)?: Total Help needed standing up from a chair using your arms (e.g., wheelchair or bedside chair)?: Total Help needed to walk in hospital room?: Total Help needed climbing 3-5 steps with a railing? : Total 6 Click Score: 6    End of Session   Activity Tolerance: Patient tolerated treatment well Patient left: in bed;with call bell/phone within reach;with bed alarm set;Other (comment) (with bed in chair position) Nurse Communication: Mobility status;Other (comment) (pt breakfast arriving during session, pt accepting x3 bites of eggs and seemingly hungry) PT Visit Diagnosis: Other abnormalities of gait and mobility (R26.89);Muscle weakness (generalized) (M62.81)     Time: 4098-1191 PT Time Calculation (min) (ACUTE ONLY): 25 min  Charges:    $Therapeutic Activity: 23-37 mins PT General Charges $$ ACUTE PT VISIT: 1 Visit                     Dugan Vanhoesen R. PTA Acute Rehabilitation Services Office: [phone]   Catalina Antigua 02/25/2023, 10:27 AM

## 2023-02-25 NOTE — Progress Notes (Signed)
[facility] 2W[facility] Liaison RN note  Received communication from [facility] that decision has been made to take patient home with hospice services, likely first of the week.  Per discussion, the plan is for discharge home by EMS likely Tuesday or Wednesday.   DME needs discussed. Per request of family  order placed for hospital bed, LAL mattress and overbed table.  Address has been verified and is correct in the chart. [name] is the family contact to arrange time of equipment delivery.   Please send signed and completed DNR with patient/family. Please provide prescriptions at discharge as needed for ongoing symptom management.    Thank you for the opportunity to participate in this patient's care.  Thea Gist, Charity fundraiser, BSN [employer] [phone]

## 2023-02-25 NOTE — TOC Progression Note (Addendum)
Transition of Care [facility]) - Progression Note    Patient Details  Name: Amanda [name] MRN: [id number] Date of Birth: [birth year]  Transition of Care [facility]) CM/SW Contact  Janae Bridgeman, RN Phone Number: 02/25/2023, 9:51 AM  Clinical Narrative:    CM called and spoke with the patient's daughter on the phone.  Plans are for patient to return home with [facility] once family sets up 24 hour care through [employer] - likely in the next 1-2 days.  Possible Weekend discharge if patient can set up home health services in the home.  [facility] is following the patient at this time and I have requested hospital bed, air mattress through them - pending discharge date - no date at this time - but likely in the next 1-2 days.  Patient will need PTAR home since she is bed bound - HTA was called to PTAR approval to home once discharge date determined.  I called [employer] and left a voicemail message with Junious Dresser, [facility] with HTA insurance requested approval for PTAR to home once patient is able to discharge home with family - hopeful discharge to home on Tuesday.  02/25/2023 1616 - I met with the husband and daughter at the bedside to discuss TOC needs and the patient's family has plans for [facility] RN to visit at the bedside tomorrow at 10 am to visit for assessment.  I sent a message to [facility] and requested delivery of the hospital bed to the home on MOnday for potential discharge to home on Tuesday.   Expected Discharge Plan: Home w Hospice Care Barriers to Discharge: No Barriers Identified (waiting on family to set up 24 hour care through [facility])  Expected Discharge Plan and Services   Discharge Planning Services: CM Consult Post Acute Care Choice: Hospice Living arrangements for the past 2 months: Single Family Home                 DME Arranged: Hospital bed           [facility] Agency:  [facility]) Date [facility] Agency Contacted: 02/24/23 Time HH Agency Contacted: 1636 Representative spoke with at [facility] Agency: [facility]   Social Determinants of Health (SDOH) Interventions SDOH Screenings   Food Insecurity: No Food Insecurity (10/09/2022)  Housing: Patient Unable To Answer (10/18/2022)  Transportation Needs: No Transportation Needs (10/09/2022)  Utilities: Not At Risk (10/09/2022)  Depression (PHQ2-9): Low Risk  (09/14/2022)  Financial Resource Strain: Low Risk  (09/14/2022)  Physical Activity: Inactive (09/14/2022)  Stress: No Stress Concern Present (09/14/2022)  Tobacco Use: Low Risk  (01/18/2023)    Readmission Risk Interventions    02/24/2023    4:38 PM 02/07/2023    4:43 PM  Readmission Risk Prevention Plan  Transportation Screening Complete Complete  PCP or Specialist Appt within 5-7 Days Complete   PCP or Specialist Appt within 3-5 Days  Complete  Home Care Screening Complete   Medication Review (RN CM) Complete   HRI or Home Care Consult  Complete  Social Work Consult for Recovery Care Planning/Counseling  Complete  Palliative Care Screening  Complete  Medication Review Oceanographer)  Complete

## 2023-02-25 NOTE — Plan of Care (Signed)

## 2023-02-25 NOTE — Progress Notes (Signed)
  Progress Note   Patient: Amanda [name] [id number] DOB: 1946/01/11 DOA: 01/18/2023     38 DOS: the patient was seen and examined on 02/25/2023   Brief hospital course:   Assessment and Plan: * Acute metabolic encephalopathy Advanced dementia (HCC) Prior baseline FAST 6 (could communicate but dependent for all cares, could ambulate only short distances with assistance), but now has progressed to being unable to stand, providing only 1 word answers. On admission, an extensive work up for reversible causes of decreased mentation and oral intake was completed.  TSH, ammonia, B12, EEG, and neuraxial imaging with CT and MRI normal.  Folate mildly low, supplemented.  No improvement with IV fluids, IV antibiotics, correction of sodium and uremia or enteral nutrition.   The patient is terminal, and the patient likely has a prognosis of weeks to months.  Previous providers have discussed with family that artificial nutrition had not improved her condition and her oral intake was not sufficient to sustain her fluid and calorie needs and recommended hospice (see palliative Care note 10/26).  Family are working with [facility] to arrange discharge in LTC.  Consult to Hospice Anticipating discharge home this weekend     Acute deep vein thrombosis (DVT) of left lower extremity (HCC) Continue Eliquis   Protein-calorie malnutrition, severe Dietitian has been involved throughout care.  Patient refuses nutrition supplements most days and also refuses most oral intake.   Continue folate as able   Refeeding syndrome -- Resolved   SIRS (systemic inflammatory response syndrome) (HCC) Sepsis ruled out.   Hypernatremia -- Resolved   Decubitus ulcer POA WOC    AKI -- Resolved   Hypomagnesemia--resolved  Hypokalemia--resolved   Essential hypertension Stable off meds   Sinus tachycardia Persistent, asyptomatic      Subjective:  Nonverbal   Physical Exam: Vitals:   02/24/23 2000 02/25/23  0523 02/25/23 0713 02/25/23 0736  BP:  129/76  110/70  Pulse: 76 (!) 102  99  Resp:  18  16  Temp:  97.7 F (36.5 C)  97.8 F (36.6 C)  TempSrc:  Oral    SpO2: 98% 100%  100%  Weight:   42 kg   Height:       Physical Exam Vitals reviewed.  Constitutional:      General: She is not in acute distress.    Appearance: She is not ill-appearing or toxic-appearing.  Cardiovascular:     Rate and Rhythm: Normal rate and regular rhythm.     Heart sounds: No murmur heard. Pulmonary:     Effort: Pulmonary effort is normal. No respiratory distress.     Breath sounds: No wheezing, rhonchi or rales.  Neurological:     Mental Status: She is alert.     Data Reviewed: CBG stable  Family Communication: none prsent  Disposition: Status is: Inpatient Remains inpatient appropriate because: pending discharge home when services and equipment ready     Time spent: 20 minutes  Author: Brendia Sacks, MD 02/25/2023 8:38 AM  For on call review [email or web address].

## 2023-02-26 DIAGNOSIS — F03B0 Unspecified dementia, moderate, without behavioral disturbance, psychotic disturbance, mood disturbance, and anxiety: Secondary | ICD-10-CM | POA: Diagnosis not present

## 2023-02-26 DIAGNOSIS — E43 Unspecified severe protein-calorie malnutrition: Secondary | ICD-10-CM | POA: Diagnosis not present

## 2023-02-26 LAB — GLUCOSE, CAPILLARY
Glucose-Capillary: 89 mg/dL (ref 70–99)
Glucose-Capillary: 95 mg/dL (ref 70–99)
Glucose-Capillary: 134 mg/dL — ABNORMAL HIGH (ref 70–99)
Glucose-Capillary: 119 mg/dL — ABNORMAL HIGH (ref 70–99)

## 2023-02-26 NOTE — Plan of Care (Signed)

## 2023-02-26 NOTE — Plan of Care (Signed)
  Problem: Elimination: Goal: Will not experience complications related to bowel motility Outcome: Progressing   Problem: Safety: Goal: Ability to remain free from injury will improve Outcome: Progressing   

## 2023-02-26 NOTE — Progress Notes (Signed)
[facility] 2W06 [facility]  Hospice hospital liaison note  Patient is planned to go home with hospice once personal care services are in place, likely first of the week.   Liaison will follow for needs.   Thank you for the opportunity to participate in this patient's plan of care  Thea Gist, BSN RN Hospice hospital liaison  [phone]

## 2023-02-26 NOTE — Progress Notes (Signed)
Progress Note   Patient: Amanda [name] [id number] DOB: 29-Apr-1945 DOA: 01/18/2023     39 DOS: the patient was seen and examined on 02/26/2023   Brief hospital course: 77 year old woman PMH dementia, living at home, presented with acute metabolic encephalopathy.  Found to have DVT and placed on apixaban.  Noted to have severe protein calorie malnutrition.  Despite extensive workup, no reversible etiology was found and ultimately her condition felt to be secondary to progressive dementia.  Trial of tube feeds was pursued, family does not want PEG tube.  Patient unable to maintain nutrition and hospice recommended.  Long-term care was pursued.  Appears at this time the patient will be going home next week once all equipment is in place.   Consultants Palliative medicine   Procedures None  Assessment and Plan: * Acute metabolic encephalopathy Advanced dementia (HCC) Prior baseline FAST 6 (could communicate but dependent for all cares, could ambulate only short distances with assistance), but now has progressed to being unable to stand, providing only 1 word answers. On admission, an extensive work up for reversible causes of decreased mentation and oral intake was completed.  TSH, ammonia, B12, EEG, and neuraxial imaging with CT and MRI normal.  Folate mildly low, supplemented.  No improvement with IV fluids, IV antibiotics, correction of sodium and uremia or enteral nutrition.   The patient is terminal, and the patient likely has a prognosis of weeks to months.  Previous providers have discussed with family that artificial nutrition had not improved her condition and her oral intake was not sufficient to sustain her fluid and calorie needs and recommended hospice (see palliative Care note 10/26).   Consult to Hospice Anticipating discharge home this coming week.    Acute deep vein thrombosis (DVT) of left lower extremity (HCC) Continue Eliquis   Protein-calorie malnutrition,  severe Dietitian involved.  Patient refuses nutrition supplements most days and also refuses most oral intake.   Continue folate as able   Refeeding syndrome -- Resolved   SIRS (systemic inflammatory response syndrome) (HCC) Sepsis ruled out.   Hypernatremia -- Resolved   Decubitus ulcer POA WOC    AKI -- Resolved   Hypomagnesemia--resolved  Hypokalemia--resolved   Essential hypertension Stable off meds     Subjective:  Nonverbal   Physical Exam: Vitals:   02/25/23 1610 02/25/23 2013 02/26/23 0436 02/26/23 0733  BP: (!) 124/91 113/70 108/68 129/61  Pulse:    (!) 104  Resp: 16   18  Temp: 97.9 F (36.6 C) 98.3 F (36.8 C) 98.4 F (36.9 C) 97.6 F (36.4 C)  TempSrc:   Oral Oral  SpO2: 100%  100% 100%  Weight:   42 kg   Height:       Physical Exam Vitals reviewed.  Constitutional:      General: She is not in acute distress.    Appearance: She is not ill-appearing or toxic-appearing.  Cardiovascular:     Rate and Rhythm: Normal rate and regular rhythm.     Heart sounds: No murmur heard. Pulmonary:     Effort: Pulmonary effort is normal. No respiratory distress.     Breath sounds: No wheezing, rhonchi or rales.  Neurological:     Mental Status: She is alert.     Comments: Nonverbal      Data Reviewed: CBG stable  Family Communication: none present  Disposition: Status is: Inpatient Remains inpatient appropriate because: awaiting equipment for home     Time spent: 20 minutes  Author: Reuel Boom  Irene Limbo, MD 02/26/2023 8:03 AM  For on call review [email or web address].

## 2023-02-27 DIAGNOSIS — E43 Unspecified severe protein-calorie malnutrition: Secondary | ICD-10-CM | POA: Diagnosis not present

## 2023-02-27 DIAGNOSIS — F03B0 Unspecified dementia, moderate, without behavioral disturbance, psychotic disturbance, mood disturbance, and anxiety: Secondary | ICD-10-CM | POA: Diagnosis not present

## 2023-02-27 LAB — GLUCOSE, CAPILLARY
Glucose-Capillary: 118 mg/dL — ABNORMAL HIGH (ref 70–99)
Glucose-Capillary: 89 mg/dL (ref 70–99)

## 2023-02-27 NOTE — Plan of Care (Signed)
  Problem: Metabolic: Goal: Ability to maintain appropriate glucose levels will improve Outcome: Progressing   Problem: Safety: Goal: Ability to remain free from injury will improve Outcome: Progressing

## 2023-02-27 NOTE — Plan of Care (Signed)

## 2023-02-27 NOTE — Progress Notes (Signed)
Progress Note   Patient: Amanda [name] [id number]:[id number] DOB: 12/03/1945 DOA: 01/18/2023     40 DOS: the patient was seen and examined on 02/27/2023   Brief hospital course: 77 year old woman PMH dementia, living at home, presented with acute metabolic encephalopathy.  Found to have DVT and placed on apixaban.  Noted to have severe protein calorie malnutrition.  Despite extensive workup, no reversible etiology was found and ultimately her condition felt to be secondary to progressive dementia.  Trial of tube feeds was pursued, family does not want PEG tube.  Patient unable to maintain nutrition and hospice recommended.  Long-term care was pursued.  Appears at this time the patient will be going home next week once all equipment is in place.   Consultants Palliative medicine   Procedures None  Assessment and Plan: * Acute metabolic encephalopathy Advanced dementia (HCC) Prior baseline FAST 6 (could communicate but dependent for all cares, could ambulate only short distances with assistance), but has progressed to being unable to stand, providing only 1 word answers. On admission, an extensive work up for reversible causes of decreased mentation and oral intake was completed.  TSH, ammonia, B12, EEG, and neuraxial imaging with CT and MRI normal.  Folate mildly low, supplemented.  No improvement with IV fluids, IV antibiotics, correction of sodium and uremia or enteral nutrition.   The patient is terminal, and the patient likely has a prognosis of weeks to months.  Previous providers have discussed with family that artificial nutrition had not improved her condition and her oral intake was not sufficient to sustain her fluid and calorie needs and recommended hospice (see palliative Care note 10/26).   Consult to Hospice Anticipating discharge home this coming week with hospice    Acute deep vein thrombosis (DVT) of left lower extremity (HCC) Will continue Eliquis although this could be  discontinued in the future based on goals of care   Protein-calorie malnutrition, severe Dietitian involved.  Patient refuses nutrition supplements most days and also refuses most oral intake.   Continue folate as able   Refeeding syndrome -- Resolved   SIRS (systemic inflammatory response syndrome) (HCC) Sepsis ruled out.   Hypernatremia -- Resolved   Decubitus ulcer POA WOC    AKI -- Resolved   Hypomagnesemia--resolved  Hypokalemia--resolved   Essential hypertension Stable off meds      Subjective:  Nonverbal   Physical Exam: Vitals:   02/26/23 2138 02/27/23 0445 02/27/23 0526 02/27/23 0744  BP: 102/61 120/71  114/65  Pulse: (!) 108 (!) 109  (!) 106  Resp:  17  18  Temp:  98.6 F (37 C)  98.6 F (37 C)  TempSrc:  Oral  Oral  SpO2:  100%  100%  Weight:   39 kg   Height:       Physical Exam Vitals reviewed.  Constitutional:      General: She is not in acute distress.    Appearance: She is not ill-appearing or toxic-appearing.  Cardiovascular:     Rate and Rhythm: Normal rate and regular rhythm.     Heart sounds: No murmur heard. Pulmonary:     Effort: Pulmonary effort is normal. No respiratory distress.     Breath sounds: No wheezing, rhonchi or rales.  Neurological:     Mental Status: She is alert.  Psychiatric:     Comments: Unchanged mood, behavior     Data Reviewed: CBG stable  Family Communication: none present but discussed by telephone with husband Amanda [name] and daughter Amanda [name];  we reviewed care, treatment plan and anticipated discharge home with hospice to state.  Disposition: Status is: Inpatient Remains inpatient appropriate because: await equipment for  hom     Time spent: 25 minutes  Author: Brendia Sacks, MD 02/27/2023 2:28 PM  For on call review [email or web address].

## 2023-02-27 NOTE — Progress Notes (Signed)
[facility] 2W06 [facility] Liaison Note  [facility] liaison continues to follow for home hospice services at discharge.  Per TOC note, family is arranging caregiver services at home for discharge and patient will be ready to return home once arranged.  Likely discharge beginning of week.  Please don't hesitate to call with any hospice related questions or concerns.  Doreatha Martin, RN, [facility] [phone]

## 2023-02-28 DIAGNOSIS — F03B0 Unspecified dementia, moderate, without behavioral disturbance, psychotic disturbance, mood disturbance, and anxiety: Secondary | ICD-10-CM | POA: Diagnosis not present

## 2023-02-28 DIAGNOSIS — I82412 Acute embolism and thrombosis of left femoral vein: Secondary | ICD-10-CM | POA: Diagnosis not present

## 2023-02-28 DIAGNOSIS — E43 Unspecified severe protein-calorie malnutrition: Secondary | ICD-10-CM | POA: Diagnosis not present

## 2023-02-28 LAB — GLUCOSE, CAPILLARY
Glucose-Capillary: 89 mg/dL (ref 70–99)
Glucose-Capillary: 99 mg/dL (ref 70–99)
Glucose-Capillary: 77 mg/dL (ref 70–99)
Glucose-Capillary: 119 mg/dL — ABNORMAL HIGH (ref 70–99)

## 2023-02-28 NOTE — Plan of Care (Signed)
  Problem: Safety: Goal: Ability to remain free from injury will improve Outcome: Progressing   

## 2023-02-28 NOTE — Progress Notes (Signed)
Progress Note   Patient: Amanda [name] [id number]:[id number] DOB: 05-16-1945 DOA: 01/18/2023     41 DOS: the patient was seen and examined on 02/28/2023   Brief hospital course: 77 year old woman PMH dementia, living at home, presented with acute metabolic encephalopathy.  Found to have DVT and placed on apixaban.  Noted to have severe protein calorie malnutrition.  Despite extensive workup, no reversible etiology was found and ultimately her condition felt to be secondary to progressive dementia.  Trial of tube feeds was pursued, family does not want PEG tube.  Patient unable to maintain nutrition and hospice recommended.  Long-term care was pursued. Ultimately plan changed, with plan now for discharge home with hospice once all equipment is in place  Consultants Palliative medicine   Procedures None  Assessment and Plan: * Acute metabolic encephalopathy Advanced dementia (HCC) Prior baseline FAST 6 (could communicate but dependent for all cares, could ambulate only short distances with assistance), but has progressed to being unable to stand, providing only 1 word answers. On admission, an extensive work up for reversible causes of decreased mentation and oral intake was completed.  TSH, ammonia, B12, EEG, and neuraxial imaging with CT and MRI normal.  Folate mildly low, supplemented.  No improvement with IV fluids, IV antibiotics, correction of sodium and uremia or enteral nutrition.   The patient is terminal, and the patient likely has a prognosis of weeks to months.  Previous providers have discussed with family that artificial nutrition had not improved her condition and her oral intake was not sufficient to sustain her fluid and calorie needs and recommended hospice (see palliative Care note 10/26).   Anticipating discharge home this coming week with hospice    Acute deep vein thrombosis (DVT) of left lower extremity (HCC) Will continue Eliquis although this could be discontinued in the  future based on goals of care   Protein-calorie malnutrition, severe Dietitian involved.  Patient refuses nutrition supplements most days and also refuses most oral intake.   Continue folate as able   Refeeding syndrome -- Resolved   SIRS (systemic inflammatory response syndrome) (HCC) Sepsis ruled out.   Hypernatremia -- Resolved   Decubitus ulcer POA WOC    AKI -- Resolved   Hypomagnesemia--resolved  Hypokalemia--resolved   Essential hypertension Stable off meds      Subjective:  Nonverbal   Physical Exam: Vitals:   02/27/23 2023 02/27/23 2333 02/28/23 0633 02/28/23 0742  BP: 116/62  120/65 121/75  Pulse: (!) 116 (!) 104 100 (!) 103  Resp: 19  18 16   Temp: 98.7 F (37.1 C)  98.2 F (36.8 C) 97.9 F (36.6 C)  TempSrc: Oral  Axillary   SpO2: 100% 100% 100% 100%  Weight:      Height:       Physical Exam Vitals reviewed.  Constitutional:      General: She is not in acute distress.    Appearance: She is not ill-appearing or toxic-appearing.  Cardiovascular:     Rate and Rhythm: Normal rate and regular rhythm.     Heart sounds: No murmur heard. Pulmonary:     Effort: Pulmonary effort is normal. No respiratory distress.     Breath sounds: No wheezing, rhonchi or rales.  Neurological:     Mental Status: She is alert.  Psychiatric:        Mood and Affect: Mood normal.        Behavior: Behavior normal.     Data Reviewed: CBG stable  Family Communication: none  Disposition: Status is: Inpatient Remains inpatient appropriate because: awaiting equipment at home     Time spent: 20 minutes  Author: Brendia Sacks, MD 02/28/2023 4:55 PM  For on call review [email or web address].

## 2023-02-28 NOTE — TOC Progression Note (Signed)
Transition of Care [facility]) - Progression Note    Patient Details  Name: Amanda [name] MRN: [id number] Date of Birth: 1946/03/07  Transition of Care [facility]) CM/SW Contact  Amanda Bacon, RN Phone Number: 02/28/2023, 11:10 AM  Clinical Narrative:   Spoke with daughter, Mrs. Amanda [name] to get update on set up for home health aide. Daughter reports nurse came out to visit with patient and family with plans to start service tomorrow evening. Daughter confirmed that she has the hospital bed set up at home and that she has a bedside commode. Daughter does not have a hoyer lift  or bed chucks. She inquired about getting home health physical therapy services. Secure message sent to [facility] Liaison to follow up with family requests to help facilitate patient getting home.     Expected Discharge Plan: Home w Hospice Care Barriers to Discharge: No Barriers Identified (waiting on family to set up 24 hour care through [facility])  Expected Discharge Plan and Services   Discharge Planning Services: CM Consult Post Acute Care Choice: Hospice Living arrangements for the past 2 months: Single Family Home                 DME Arranged: Hospital bed           [facility] Agency:  [facility]) Date [facility] Agency Contacted: 02/24/23 Time HH Agency Contacted: 1636 Representative spoke with at [facility] Agency: [facility]   Social Determinants of Health (SDOH) Interventions SDOH Screenings   Food Insecurity: No Food Insecurity (10/09/2022)  Housing: Patient Unable To Answer (10/18/2022)  Transportation Needs: No Transportation Needs (10/09/2022)  Utilities: Not At Risk (10/09/2022)  Depression (PHQ2-9): Low Risk  (09/14/2022)  Financial Resource Strain: Low Risk  (09/14/2022)  Physical Activity: Inactive (09/14/2022)  Stress: No Stress Concern Present (09/14/2022)  Tobacco Use: Low Risk  (01/18/2023)    Readmission Risk Interventions    02/24/2023    4:38 PM 02/07/2023     4:43 PM  Readmission Risk Prevention Plan  Transportation Screening Complete Complete  PCP or Specialist Appt within 5-7 Days Complete   PCP or Specialist Appt within 3-5 Days  Complete  Home Care Screening Complete   Medication Review (RN CM) Complete   HRI or Home Care Consult  Complete  Social Work Consult for Recovery Care Planning/Counseling  Complete  Palliative Care Screening  Complete  Medication Review Oceanographer)  Complete

## 2023-02-28 NOTE — Plan of Care (Signed)

## 2023-03-01 DIAGNOSIS — G9341 Metabolic encephalopathy: Secondary | ICD-10-CM | POA: Diagnosis not present

## 2023-03-01 LAB — GLUCOSE, CAPILLARY
Glucose-Capillary: 86 mg/dL (ref 70–99)
Glucose-Capillary: 144 mg/dL — ABNORMAL HIGH (ref 70–99)

## 2023-03-01 MED ORDER — FOLIC ACID 1 MG PO TABS: 1.0000 mg | ORAL_TABLET | Freq: Every day | ORAL | 0 refills | Status: DC

## 2023-03-01 MED ORDER — APIXABAN 5 MG PO TABS: 5.0000 mg | ORAL_TABLET | Freq: Two times a day (BID) | ORAL | 1 refills | Status: DC

## 2023-03-01 NOTE — Discharge Summary (Signed)
Physician Discharge Summary  Amanda [name]:[id number] DOB: 1945/07/29 DOA: 01/18/2023  PCP: [name], MD  Admit date: 01/18/2023 Discharge date: 03/02/2023  Admitted From: Home Disposition:  Home with Hospice  Discharge Condition:Stable CODE STATUS:DNR Diet recommendation: Dysphagia 1  Brief/Interim Summary: Patient is a 77 year old woman PMH dementia, living at home, presented with acute metabolic encephalopathy. Found to have DVT and placed on apixaban. Noted to have severe protein calorie malnutrition. Despite extensive workup, no reversible etiology was found and ultimately her condition felt to be secondary to progressive dementia. Trial of tube feeds was pursued, family does not want PEG tube. Patient unable to maintain nutrition and hospice recommended.  Currently on dysphagia 1 diet.  Long-term care was pursued. Ultimately plan changed, with plan now for discharge home with hospice .  Plan for discharge after delivery of equipment to home.  Following problems were addressed during the hospitalization:  Acute metabolic encephalopathy Advanced dementia (HCC) Prior baseline FAST 6 (could communicate but dependent for all cares, could ambulate only short distances with assistance), but has progressed to being unable to stand, providing only 1 word answers. On admission, an extensive work up for reversible causes of decreased mentation and oral intake was completed.  TSH, ammonia, B12, EEG, and neuraxial imaging with CT and MRI normal.  Folate mildly low, supplemented.  No improvement with IV fluids, IV antibiotics, correction of sodium and uremia or enteral nutrition.   The patient is terminal, and the patient likely has a prognosis of weeks to months.  Previous providers have discussed with family that artificial nutrition had not improved her condition and her oral intake was not sufficient to sustain her fluid and calorie needs and recommended hospice (see palliative Care  note 10/26).   Plan for  discharge home  with hospice    Acute deep vein thrombosis (DVT) of left lower extremity (HCC) Will continue Eliquis although this could be discontinued in the future based on goals of care   Protein-calorie malnutrition, severe Dietitian involved.  Patient refuses nutrition supplements most days and also refuses most oral intake.   Continue folate as able On dysphagia 1 diet   Refeeding syndrome -- Resolved   SIRS  Sepsis ruled out.   Hypernatremia -- Resolved   Decubitus ulcer POA WOC    AKI -- Resolved   Hypomagnesemia--resolved  Hypokalemia--resolved   Essential hypertension Stable off meds  Discharge Diagnoses:  Principal Problem:   Acute metabolic encephalopathy Active Problems:   Hypernatremia   SIRS (systemic inflammatory response syndrome) (HCC)   Refeeding syndrome   AKI (acute kidney injury) (HCC)   Decubitus ulcer   Dysphagia   Hypertension   Moderate dementia (HCC)   Acute deep vein thrombosis (DVT) of left lower extremity (HCC)   DNR (do not resuscitate)/DNI(Do Not Intubate)   Hypokalemia   Hypomagnesemia   Protein-calorie malnutrition, severe    Discharge Instructions  Discharge Instructions     Diet - low sodium heart healthy   Complete by: As directed    Discharge instructions   Complete by: As directed    Please follow up with hospice services   Discharge wound care:   Complete by: As directed    As per wound care nurse   Increase activity slowly   Complete by: As directed       Allergies as of 03/02/2023   No Known Allergies      Medication List     TAKE these medications    acetaminophen 325  MG tablet Commonly known as: TYLENOL Take 2 tablets (650 mg total) by mouth every 6 (six) hours as needed for moderate pain or fever.   apixaban 5 MG Tabs tablet Commonly known as: ELIQUIS Take 1 tablet (5 mg total) by mouth 2 (two) times daily.   DYNADERM HYDROCOLL FOAM 4"X4" EX Apply 1 patch  topically daily.   feeding supplement Liqd Take 237 mLs by mouth 2 (two) times daily between meals.   folic acid 1 MG tablet Commonly known as: FOLVITE Take 1 tablet (1 mg total) by mouth daily.   leptospermum manuka honey Pste paste Apply 1 Application topically daily. Start taking on: March 03, 2023   polyethylene glycol 17 g packet Commonly known as: MIRALAX / GLYCOLAX Take 17 g by mouth daily. What changed:  when to take this reasons to take this   silver sulfADIAZINE 1 % cream Commonly known as: SILVADENE Apply 1 Application topically daily.               Discharge Care Instructions  (From admission, onward)           Start     Ordered   03/01/23 0000  Discharge wound care:       Comments: As per wound care nurse   03/01/23 1010            Follow-up Information     [facility] Follow up.   Specialty: Hospice and Palliative Medicine Why: [facility] will offer home hospice services. Contact information: [address] [facility] [place] [address] [phone]               No Known Allergies  Consultations: Palliative care   Procedures/Studies: No results found.    Subjective: Patient seen and examined the bedside today.  Hemodynamically stable.  Looks weak and deconditioned.  Confused, lying in bed.  Did not talk when called  her name.  Not in any kind of distress.   I called her husband and discussed about discharge plan after equipment are delivered  Discharge Exam: Vitals:   03/02/23 0420 03/02/23 0742  BP: 126/71 101/61  Pulse: 99 96  Resp:  18  Temp: 97.7 F (36.5 C)   SpO2:  100%   Vitals:   03/01/23 0417 03/01/23 2100 03/02/23 0420 03/02/23 0742  BP: (!) 147/67 134/78 126/71 101/61  Pulse: 84  99 96  Resp:    18  Temp: 97.7 F (36.5 C) 98.4 F (36.9 C) 97.7 F (36.5 C)   TempSrc: Oral Axillary Oral   SpO2: 100%   100%  Weight:   39 kg   Height:        General: Pt is alert, awake, not  in acute distress, confused, weak, deconditioned, overall comfortable Cardiovascular: RRR, S1/S2 +, no rubs, no gallops Respiratory: CTA bilaterally, no wheezing, no rhonchi Abdominal: Soft, NT, ND, bowel sounds + Extremities: no edema, no cyanosis, right foot ulcers    The results of significant diagnostics from this hospitalization (including imaging, microbiology, ancillary and laboratory) are listed below for reference.     Microbiology: No results found for this or any previous visit (from the past 240 hour(s)).   Labs: BNP (last 3 results) No results for input(s): "BNP" in the last 8760 hours. Basic Metabolic Panel: No results for input(s): "NA", "K", "CL", "CO2", "GLUCOSE", "BUN", "CREATININE", "CALCIUM", "MG", "PHOS" in the last 168 hours. Liver Function Tests: No results for input(s): "AST", "ALT", "ALKPHOS", "BILITOT", "PROT", "ALBUMIN" in the last 168 hours.  No results for input(s): "LIPASE", "AMYLASE" in the last 168 hours. No results for input(s): "AMMONIA" in the last 168 hours. CBC: No results for input(s): "WBC", "NEUTROABS", "HGB", "HCT", "MCV", "PLT" in the last 168 hours. Cardiac Enzymes: No results for input(s): "CKTOTAL", "CKMB", "CKMBINDEX", "TROPONINI" in the last 168 hours. BNP: Invalid input(s): "POCBNP" CBG: Recent Labs  Lab 02/28/23 1706 02/28/23 2008 03/01/23 1122 03/01/23 2019 03/02/23 0744  GLUCAP 77 119* 86 144* 87   D-Dimer No results for input(s): "DDIMER" in the last 72 hours. Hgb A1c No results for input(s): "HGBA1C" in the last 72 hours. Lipid Profile No results for input(s): "CHOL", "HDL", "LDLCALC", "TRIG", "CHOLHDL", "LDLDIRECT" in the last 72 hours. Thyroid function studies No results for input(s): "TSH", "T4TOTAL", "T3FREE", "THYROIDAB" in the last 72 hours.  Invalid input(s): "FREET3" Anemia work up No results for input(s): "VITAMINB12", "FOLATE", "FERRITIN", "TIBC", "IRON", "RETICCTPCT" in the last 72 hours. Urinalysis     Component Value Date/Time   COLORURINE YELLOW 01/18/2023 1907   APPEARANCEUR HAZY (A) 01/18/2023 1907   LABSPEC 1.019 01/18/2023 1907   PHURINE 5.0 01/18/2023 1907   GLUCOSEU NEGATIVE 01/18/2023 1907   HGBUR NEGATIVE 01/18/2023 1907   BILIRUBINUR NEGATIVE 01/18/2023 1907   KETONESUR NEGATIVE 01/18/2023 1907   PROTEINUR NEGATIVE 01/18/2023 1907   NITRITE NEGATIVE 01/18/2023 1907   LEUKOCYTESUR NEGATIVE 01/18/2023 1907   Sepsis Labs No results for input(s): "WBC" in the last 168 hours.  Invalid input(s): "PROCALCITONIN", "LACTICIDVEN" Microbiology No results found for this or any previous visit (from the past 240 hour(s)).  Please note: You were cared for by a hospitalist during your hospital stay. Once you are discharged, your primary care physician will handle any further medical issues. Please note that NO REFILLS for any discharge medications will be authorized once you are discharged, as it is imperative that you return to your primary care physician (or establish a relationship with a primary care physician if you do not have one) for your post hospital discharge needs so that they can reassess your need for medications and monitor your lab values.    Time coordinating discharge: 40 minutes  SIGNED:   Burnadette Pop, MD  [facility] 03/02/2023, 10:40 AM Pager [phone]  If 7PM-7AM, please contact night-coverage [email or web address] Password [email or web address]

## 2023-03-01 NOTE — TOC Progression Note (Signed)
Transition of Care [facility]) - Progression Note    Patient Details  Name: Amanda [name] MRN: [id number] Date of Birth: [birth year]  Transition of Care [facility]) CM/SW Contact  Huston Foley Jacklynn Ganong, RN Phone Number: 03/01/2023, 11:15 AM  Clinical Narrative:    Patient's family has requested [facility] send hoyer lift and wheelchair, plan is for patient to come home on 11/3, Nurse is scheduled for 5p on 03/02/23. Will need PTAR transport home. TOC will continue to monitor.    Expected Discharge Plan: Home w Hospice Care Barriers to Discharge: No Barriers Identified (waiting on family to set up 24 hour care through [employer])  Expected Discharge Plan and Services   Discharge Planning Services: CM Consult Post Acute Care Choice: Hospice Living arrangements for the past 2 months: Single Family Home                 DME Arranged: Hospital bed           [facility] Agency:  [facility]) Date [facility] Agency Contacted: 02/24/23 Time HH Agency Contacted: 1636 Representative spoke with at [facility] Agency: [facility]   Social Determinants of Health (SDOH) Interventions SDOH Screenings   Food Insecurity: No Food Insecurity (10/09/2022)  Housing: Patient Unable To Answer (10/18/2022)  Transportation Needs: No Transportation Needs (10/09/2022)  Utilities: Not At Risk (10/09/2022)  Depression (PHQ2-9): Low Risk  (09/14/2022)  Financial Resource Strain: Low Risk  (09/14/2022)  Physical Activity: Inactive (09/14/2022)  Stress: No Stress Concern Present (09/14/2022)  Tobacco Use: Low Risk  (01/18/2023)    Readmission Risk Interventions    02/24/2023    4:38 PM 02/07/2023    4:43 PM  Readmission Risk Prevention Plan  Transportation Screening Complete Complete  PCP or Specialist Appt within 5-7 Days Complete   PCP or Specialist Appt within 3-5 Days  Complete  Home Care Screening Complete   Medication Review (RN CM) Complete   HRI or Home Care Consult  Complete  Social  Work Consult for Recovery Care Planning/Counseling  Complete  Palliative Care Screening  Complete  Medication Review Ocean[employer])  Complete

## 2023-03-02 ENCOUNTER — Ambulatory Visit: Payer: PPO | Admitting: Podiatry

## 2023-03-02 LAB — GLUCOSE, CAPILLARY: Glucose-Capillary: 87 mg/dL (ref 70–99)

## 2023-03-02 MED ORDER — MEDIHONEY WOUND/BURN DRESSING EX PSTE: 1.0000 | PASTE | Freq: Every day | CUTANEOUS | 1 refills | Status: DC

## 2023-03-02 NOTE — Progress Notes (Signed)
Patient seen and examined the bedside today.  Hemodynamically stable.  Lying on bed.  Confused, moving on the bed but not in distress or agitated.  No new change in the medical management.  All the equipment have been delivered.  She has been discharged today to home with hospice.  I had called her husband yesterday and discussed about discharge planning.DC summary/orders already put yesterday

## 2023-03-02 NOTE — TOC Progression Note (Signed)
Transition of Care [facility]) - Progression Note    Patient Details  Name: Amanda [name] MRN: [id number] Date of Birth: 1945/06/30  Transition of Care [facility]) CM/SW Contact  Nadene Rubins Adria Devon, RN Phone Number: 03/02/2023, 10:46 AM  Clinical Narrative:     Spoke to patient's daughter [name] via phone. Daughter has DME delivered and ready for her mother to come home. Wants prescriptions sent to [facility] on [address] ( MD already sent to that pharmacy ). Daughter asking for prescription for wound ointment to be sent to pharmacy . MD aware and sent .   Nurse will call daughter to discuss discharge instructions.   PTAR called estimated time of pick up "a few minutes". Left daughter a message and secure chatted team.  PTAR paperwork at front desk and DNR form for MD to sign. MD aware   Expected Discharge Plan: Home w Hospice Care Barriers to Discharge: No Barriers Identified (waiting on family to set up 24 hour care through [employer])  Expected Discharge Plan and Services   Discharge Planning Services: CM Consult Post Acute Care Choice: Hospice Living arrangements for the past 2 months: Single Family Home Expected Discharge Date: 03/02/23               DME Arranged: Hospital bed           [facility] Agency:  [facility]) Date [facility] Agency Contacted: 02/24/23 Time HH Agency Contacted: 1636 Representative spoke with at [facility] Agency: [facility]   Social Determinants of Health (SDOH) Interventions SDOH Screenings   Food Insecurity: No Food Insecurity (10/09/2022)  Housing: Patient Unable To Answer (10/18/2022)  Transportation Needs: No Transportation Needs (10/09/2022)  Utilities: Not At Risk (10/09/2022)  Depression (PHQ2-9): Low Risk  (09/14/2022)  Financial Resource Strain: Low Risk  (09/14/2022)  Physical Activity: Inactive (09/14/2022)  Stress: No Stress Concern Present (09/14/2022)  Tobacco Use: Low Risk  (01/18/2023)    Readmission  Risk Interventions    02/24/2023    4:38 PM 02/07/2023    4:43 PM  Readmission Risk Prevention Plan  Transportation Screening Complete Complete  PCP or Specialist Appt within 5-7 Days Complete   PCP or Specialist Appt within 3-5 Days  Complete  Home Care Screening Complete   Medication Review (RN CM) Complete   HRI or Home Care Consult  Complete  Social Work Consult for Recovery Care Planning/Counseling  Complete  Palliative Care Screening  Complete  Medication Review Oceanographer)  Complete

## 2023-03-02 NOTE — Plan of Care (Signed)

## 2023-06-07 ENCOUNTER — Ambulatory Visit: Payer: PPO | Admitting: Podiatry

## 2023-06-08 ENCOUNTER — Telehealth: Payer: Self-pay | Admitting: Podiatry

## 2023-06-14 ENCOUNTER — Encounter: Payer: Self-pay | Admitting: Internal Medicine

## 2023-09-07 ENCOUNTER — Ambulatory Visit: Payer: PPO | Admitting: Podiatry

## 2023-09-20 ENCOUNTER — Ambulatory Visit (INDEPENDENT_AMBULATORY_CARE_PROVIDER_SITE_OTHER): Payer: PPO

## 2023-09-20 DIAGNOSIS — Z0000 Encounter for general adult medical examination without abnormal findings: Secondary | ICD-10-CM

## 2023-09-20 NOTE — Patient Instructions (Signed)
 Amanda [name] , Thank you for taking time out of your busy schedule to complete your Annual Wellness Visit with me. I enjoyed our conversation and look forward to speaking with you again next year. I, as well as your care team,  appreciate your ongoing commitment to your health goals. Please review the following plan we discussed and let me know if I can assist you in the future. Your Game plan/ To Do List    Referrals: If you haven't heard from the office you've been referred to, please reach out to them at the phone provided.  N/a Follow up Visits: Next Medicare AWV with our clinical staff: 09/25/2024 at 3:30   Have you seen your provider in the last 6 months (3 months if uncontrolled diabetes)? No Next Office Visit with your provider: will call to schedule  Clinician Recommendations:  Aim for 30 minutes of exercise or brisk walking, 6-8 glasses of water , and 5 servings of fruits and vegetables each day.       This is a list of the screening recommended for you and due dates:  Health Maintenance  Topic Date Due   DTaP/Tdap/Td vaccine (1 - Tdap) Never done   Zoster (Shingles) Vaccine (1 of 2) Never done   DEXA scan (bone density measurement)  Never done   COVID-19 Vaccine (7 - 2024-25 season) 12/19/2022   Flu Shot  11/18/2023   Pneumonia Vaccine  Completed   Hepatitis C Screening  Completed   HPV Vaccine  Aged Out   Meningitis B Vaccine  Aged Out    Advanced directives: (In Chart) A copy of your advanced directives are scanned into your chart should your provider ever need it. Advance Care Planning is important because it:  [x]  Makes sure you receive the medical care that is consistent with your values, goals, and preferences  [x]  It provides guidance to your family and loved ones and reduces their decisional burden about whether or not they are making the right decisions based on your wishes.  Follow the link provided in your after visit summary or read over the paperwork we have  mailed to you to help you started getting your Advance Directives in place. If you need assistance in completing these, please reach out to us  so that we can help you!  See attachments for Preventive Care and Fall Prevention Tips.

## 2023-09-20 NOTE — Progress Notes (Signed)
 Subjective:   Amanda [name] is a 78 y.o. who presents for a Medicare Wellness preventive visit.  As a reminder, Annual Wellness Visits don't include a physical exam, and some assessments may be limited, especially if this visit is performed virtually. We may recommend an in-person follow-up visit with your provider if needed.  Visit Complete: Virtual I connected with  [name] on 09/20/23 by a audio enabled telemedicine application and verified that I am speaking with the correct person using two identifiers.  Patient Location: Home  Provider Location: Office/Clinic  I discussed the limitations of evaluation and management by telemedicine. The patient expressed understanding and agreed to proceed.  Vital Signs: Because this visit was a virtual/telehealth visit, some criteria may be missing or patient reported. Any vitals not documented were not able to be obtained and vitals that have been documented are patient reported.  VideoError- Librarian, academic were attempted between this provider and patient, however failed, due to patient having technical difficulties OR patient did not have access to video capability.  We continued and completed visit with audio only.   Persons Participating in Visit: Patient assisted by spouse.  AWV Questionnaire: No: Patient Medicare AWV questionnaire was not completed prior to this visit.  Cardiac Risk Factors include: advanced age (>59men, >74 women);hypertension     Objective:     Today's Vitals   There is no height or weight on file to calculate BMI.     09/20/2023    3:40 PM 01/28/2023    5:53 AM 01/18/2023    6:15 PM 10/18/2022    1:01 PM 10/09/2022    6:00 AM 09/14/2022    3:06 PM 09/06/2022   10:51 PM  Advanced Directives  Does Patient Have a Medical Advance Directive? Yes Yes Yes Yes Yes No No  Type of Advance Directive Out of facility DNR (pink MOST or yellow form) Healthcare Power of [employer] Power of Attorney Living will     Does patient want to make changes to medical advance directive?  No - Patient declined  No - Guardian declined Yes (Inpatient - patient defers changing a medical advance directive at this time - Information given)    Copy of Healthcare Power of Attorney in Chart?   No - copy requested, Physician notified        Current Medications (verified) Outpatient Encounter Medications as of 09/20/2023  Medication Sig   EQ ASPIRIN ADULT LOW DOSE 81 MG tablet Take 81 mg by mouth daily.   polyethylene glycol (MIRALAX  / GLYCOLAX ) 17 g packet Take 17 g by mouth daily. (Patient taking differently: Take 17 g by mouth daily as needed for mild constipation.)   senna (SENOKOT) 8.6 MG TABS tablet Take 1 tablet by mouth 2 (two) times daily as needed.   acetaminophen  (TYLENOL ) 325 MG tablet Take 2 tablets (650 mg total) by mouth every 6 (six) hours as needed for moderate pain or fever. (Patient not taking: Reported on 01/18/2023)   apixaban  (ELIQUIS ) 5 MG TABS tablet Take 1 tablet (5 mg total) by mouth 2 (two) times daily. (Patient not taking: Reported on 09/20/2023)   feeding supplement (ENSURE ENLIVE / ENSURE PLUS) LIQD Take 237 mLs by mouth 2 (two) times daily between meals. (Patient not taking: Reported on 01/19/2023)   folic acid  (FOLVITE ) 1 MG tablet Take 1 tablet (1 mg total) by mouth daily. (Patient not taking: Reported on 09/20/2023)   Hydroactive Dressings (DYNADERM HYDROCOLL FOAM 4"X4" EX) Apply  1 patch topically daily. (Patient not taking: Reported on 09/20/2023)   leptospermum manuka honey (MEDIHONEY) PSTE paste Apply 1 Application topically daily. (Patient not taking: Reported on 09/20/2023)   silver  sulfADIAZINE  (SILVADENE ) 1 % cream Apply 1 Application topically daily. (Patient not taking: Reported on 09/20/2023)   No facility-administered encounter medications on file as of 09/20/2023.    Allergies (verified) Patient has no known allergies.   History: Past Medical  History:  Diagnosis Date   Dementia (HCC)    Dyslipidemia    History reviewed. No pertinent surgical history. Family History  Problem Relation Age of Onset   Dementia Mother    Heart disease Maternal Grandmother    Social History   Socioeconomic History   Marital status: Married    Spouse name: Not on file   Number of children: 2   Years of education: Not on file   Highest education level: Not on file  Occupational History   Occupation: Retired  Tobacco Use   Smoking status: Never   Smokeless tobacco: Never  Vaping Use   Vaping status: Never Used  Substance and Sexual Activity   Alcohol use: No    Alcohol/week: 0.0 standard drinks of alcohol   Drug use: No   Sexual activity: Not Currently    Partners: Male  Other Topics Concern   Not on file  Social History Narrative   Pt lives in 2 story home with her husband and children   Has 2 adult children   12th grade education   Social Drivers of Health   Financial Resource Strain: Low Risk  (09/20/2023)   Overall Financial Resource Strain (CARDIA)    Difficulty of Paying Living Expenses: Not hard at all  Food Insecurity: No Food Insecurity (09/20/2023)   Hunger Vital Sign    Worried About Running Out of Food in the Last Year: Never true    Ran Out of Food in the Last Year: Never true  Transportation Needs: No Transportation Needs (09/20/2023)   PRAPARE - Administrator, Civil Service (Medical): No    Lack of Transportation (Non-Medical): No  Physical Activity: Inactive (09/20/2023)   Exercise Vital Sign    Days of Exercise per Week: 0 days    Minutes of Exercise per Session: 0 min  Stress: No Stress Concern Present (09/20/2023)   [employer] of Occupational Health - Occupational Stress Questionnaire    Feeling of Stress : Not at all  Social Connections: Moderately Isolated (09/20/2023)   Social Connection and Isolation Panel [NHANES]    Frequency of Communication with Friends and Family: Never    Frequency  of Social Gatherings with Friends and Family: More than three times a week    Attends Religious Services: Never    Database administrator or Organizations: No    Attends Engineer, structural: Never    Marital Status: Married    Tobacco Counseling Counseling given: Not Answered    Clinical Intake:  Pre-visit preparation completed: Yes  Pain : No/denies pain     Nutritional Risks: None Diabetes: No  Lab Results  Component Value Date   HGBA1C 5.3 10/08/2022     How often do you need to have someone help you when you read instructions, pamphlets, or other written materials from your doctor or pharmacy?: 4 - Often  Interpreter Needed?: No  Information entered by :: NAllen LPN   Activities of Daily Living     09/20/2023    3:30 PM 01/28/2023  5:53 AM  In your present state of health, do you have any difficulty performing the following activities:  Hearing? 0 0  Vision? 0 0  Difficulty concentrating or making decisions? 1 1  Walking or climbing stairs? 1   Dressing or bathing? 1   Comment has an aide   Doing errands, shopping? 1   Preparing Food and eating ? Y   Using the Toilet? N   In the past six months, have you accidently leaked urine? Y   Comment incontinent   Do you have problems with loss of bowel control? Y   Comment incontinent   Managing your Medications? Y   Managing your Finances? Y   Housekeeping or managing your Housekeeping? Y     Patient Care Team: Watson Hacking, MD as PCP - General (Family Medicine) Jhonny Moss, MD as Consulting Physician (Neurology)  I have updated your Care Teams any recent Medical Services you may have received from other providers in the past year.     Assessment:    This is a routine wellness examination for Trinda.  Hearing/Vision screen Hearing Screening - Comments:: Denies hearing issues Vision Screening - Comments:: No regular eye exams   Goals Addressed             This Visit's  Progress    Patient Stated       09/20/2023, husband wants her to start walking again       Depression Screen     09/20/2023    3:45 PM 09/14/2022    3:09 PM 07/27/2022    3:35 PM 10/18/2019   10:18 AM 04/26/2017    2:40 PM 03/15/2016   11:41 AM 05/30/2014    3:07 PM  PHQ 2/9 Scores  PHQ - 2 Score 0 0 0 1 0 0 0  Exception Documentation     Patient refusal      Fall Risk     09/20/2023    3:41 PM 09/14/2022    3:09 PM 07/27/2022    3:34 PM 03/06/2021    2:34 PM 11/22/2019    4:01 PM  Fall Risk   Falls in the past year? 0 0 0 0 0  Number falls in past yr: 0 0 0 0 0  Injury with Fall? 0 0 0 0 0  Risk for fall due to : Impaired mobility;Impaired balance/gait;Medication side effect No Fall Risks No Fall Risks    Follow up Falls evaluation completed;Falls prevention discussed Falls prevention discussed;Education provided;Falls evaluation completed Falls evaluation completed      MEDICARE RISK AT HOME:  Medicare Risk at Home Any stairs in or around the home?: Yes (has a ramp) If so, are there any without handrails?: No Home free of loose throw rugs in walkways, pet beds, electrical cords, etc?: Yes Adequate lighting in your home to reduce risk of falls?: Yes Life alert?: No Use of a cane, walker or w/c?: Yes Grab bars in the bathroom?: No Shower chair or bench in shower?: No (bed baths) Elevated toilet seat or a handicapped toilet?: No  TIMED UP AND GO:  Was the test performed?  No  Cognitive Function: Impaired: Patient has current diagnosis of cognitive impairment.    11/22/2019    4:00 PM 01/09/2018    2:00 PM 10/02/2014   10:01 AM  MMSE - Mini Mental State Exam  Orientation to time 0 1 2  Orientation to Place 2 4 5   Registration 3 3 3   Attention/ Calculation 0  2 4  Recall 0 0 0  Language- name 2 objects 1 2 2   Language- repeat 0 1 1  Language- follow 3 step command 0 1 3  Language- read & follow direction 0 0 1  Write a sentence 0 0 1  Copy design 0 0 1  Total score 6 14  23         Immunizations Immunization History  Administered Date(s) Administered   Fluad Quad(high Dose 65+) 01/26/2022   Influenza, High Dose Seasonal PF 02/15/2013, 05/07/2015, 03/15/2016, 03/24/2017   Influenza,inj,Quad PF,6+ Mos 05/30/2014   PFIZER Comirnaty (Gray Top)Covid-19 Tri-Sucrose Vaccine 10/30/2020, 01/26/2022   PFIZER(Purple Top)SARS-COV-2 Vaccination 06/01/2019, 06/26/2019, 03/06/2020   Pfizer Covid-19 Vaccine Bivalent Booster 36yrs & up 04/24/2021   Pneumococcal Conjugate-13 05/30/2014   Pneumococcal Polysaccharide-23 03/15/2016    Screening Tests Health Maintenance  Topic Date Due   DTaP/Tdap/Td (1 - Tdap) Never done   Zoster Vaccines- Shingrix (1 of 2) Never done   DEXA SCAN  Never done   COVID-19 Vaccine (7 - 2024-25 season) 12/19/2022   INFLUENZA VACCINE  11/18/2023   Pneumonia Vaccine 20+ Years old  Completed   Hepatitis C Screening  Completed   HPV VACCINES  Aged Out   Meningococcal B Vaccine  Aged Out    Health Maintenance  Health Maintenance Due  Topic Date Due   DTaP/Tdap/Td (1 - Tdap) Never done   Zoster Vaccines- Shingrix (1 of 2) Never done   DEXA SCAN  Never done   COVID-19 Vaccine (7 - 2024-25 season) 12/19/2022   Health Maintenance Items Addressed: Declines DEXA at this time. Due for covid, TDAP and shi vaccines.  Additional Screening:  Vision Screening: Recommended annual ophthalmology exams for early detection of glaucoma and other disorders of the eye. Would you like a referral to an eye doctor? No    Dental Screening: Recommended annual dental exams for proper oral hygiene  Community Resource Referral / Chronic Care Management: CRR required this visit?  No   CCM required this visit?  No   Plan:    I have personally reviewed and noted the following in the patient's chart:   Medical and social history Use of alcohol, tobacco or illicit drugs  Current medications and supplements including opioid prescriptions. Patient is  not currently taking opioid prescriptions. Functional ability and status Nutritional status Physical activity Advanced directives List of other physicians Hospitalizations, surgeries, and ER visits in previous 12 months Vitals Screenings to include cognitive, depression, and falls Referrals and appointments  In addition, I have reviewed and discussed with patient certain preventive protocols, quality metrics, and best practice recommendations. A written personalized care plan for preventive services as well as general preventive health recommendations were provided to patient.   Areatha Beecham, LPN   11/18/9560   After Visit Summary: (MyChart) Due to this being a telephonic visit, the after visit summary with patients personalized plan was offered to patient via MyChart   Notes: Nothing significant to report at this time.

## 2023-11-07 ENCOUNTER — Telehealth: Payer: Self-pay | Admitting: Family Medicine

## 2023-11-08 ENCOUNTER — Encounter: Payer: Self-pay | Admitting: Family Medicine

## 2023-11-08 NOTE — Telephone Encounter (Signed)
 Letter typed per [place], husband Lamar informed

## 2023-11-08 NOTE — Telephone Encounter (Signed)
 Copied from CRM [id number]. Topic: General - Other >> Nov 08, 2023 10:27 AM Marissa P wrote: Reason for CRM:  Please call this patients husband [phone] at that number once letter is ready please.

## 2023-11-18 NOTE — Telephone Encounter (Signed)
 Spouse [name] came by office with POA & continuing POA forms to scan into pt's chart.  He states he needs a letter from her doctor stating that due to pt's dementia & health conditions she is unable to handle any of her own affairs including financial affairs. Spouse is POA. Is this ok for me to write?

## 2023-11-18 NOTE — Telephone Encounter (Signed)
 He said the lady at the bank needed a letter from her doctor stating that she had dementia and was unable to handle her financial affairs.  He states pt is bedridden & has dementia & you have treated her for years.

## 2023-11-18 DEATH — deceased

## 2023-12-06 ENCOUNTER — Ambulatory Visit: Payer: PPO | Admitting: Podiatry

## 2023-12-27 ENCOUNTER — Ambulatory Visit: Admitting: Podiatry

## 2024-09-25 ENCOUNTER — Ambulatory Visit
